# Patient Record
Sex: Male | Born: 1935 | Race: White | Hispanic: No | Marital: Married
Health system: Southern US, Community
[De-identification: ages and names within clinical notes are randomized; demographics above are authoritative.]

## PROBLEM LIST (undated history)

## (undated) DIAGNOSIS — H269 Unspecified cataract: Secondary | ICD-10-CM

## (undated) DIAGNOSIS — I10 Essential (primary) hypertension: Secondary | ICD-10-CM

## (undated) DIAGNOSIS — N200 Calculus of kidney: Secondary | ICD-10-CM

## (undated) DIAGNOSIS — I480 Paroxysmal atrial fibrillation: Secondary | ICD-10-CM

## (undated) DIAGNOSIS — E785 Hyperlipidemia, unspecified: Secondary | ICD-10-CM

## (undated) DIAGNOSIS — N183 Chronic kidney disease, stage 3 unspecified: Secondary | ICD-10-CM

## (undated) DIAGNOSIS — I442 Atrioventricular block, complete: Secondary | ICD-10-CM

## (undated) DIAGNOSIS — I6529 Occlusion and stenosis of unspecified carotid artery: Secondary | ICD-10-CM

## (undated) DIAGNOSIS — G4701 Insomnia due to medical condition: Principal | ICD-10-CM

## (undated) DIAGNOSIS — J309 Allergic rhinitis, unspecified: Secondary | ICD-10-CM

## (undated) DIAGNOSIS — N400 Enlarged prostate without lower urinary tract symptoms: Secondary | ICD-10-CM

## (undated) DIAGNOSIS — J449 Chronic obstructive pulmonary disease, unspecified: Secondary | ICD-10-CM

## (undated) HISTORY — DX: Allergic rhinitis, unspecified: J30.9

## (undated) HISTORY — DX: Insomnia due to medical condition: G47.01

## (undated) HISTORY — PX: PACEMAKER INSERTION: SHX728

## (undated) HISTORY — DX: Calculus of kidney: N20.0

## (undated) HISTORY — DX: Chronic kidney disease, stage 3 unspecified: N18.30

## (undated) HISTORY — DX: Benign prostatic hyperplasia without lower urinary tract symptoms: N40.0

## (undated) HISTORY — DX: Chronic kidney disease, stage 3 (moderate): N18.3

## (undated) HISTORY — DX: Atrioventricular block, complete: I44.2

## (undated) HISTORY — DX: Hyperlipidemia, unspecified: E78.5

## (undated) HISTORY — DX: Unspecified cataract: H26.9

## (undated) HISTORY — DX: Paroxysmal atrial fibrillation: I48.0

## (undated) HISTORY — DX: Occlusion and stenosis of unspecified carotid artery: I65.29

---

## 1999-12-06 ENCOUNTER — Ambulatory Visit (HOSPITAL_COMMUNITY): Admission: RE | Admit: 1999-12-06 | Discharge: 1999-12-06 | Payer: Self-pay | Admitting: *Deleted

## 1999-12-06 ENCOUNTER — Encounter: Payer: Self-pay | Admitting: *Deleted

## 2000-02-29 ENCOUNTER — Ambulatory Visit (HOSPITAL_BASED_OUTPATIENT_CLINIC_OR_DEPARTMENT_OTHER): Admission: RE | Admit: 2000-02-29 | Discharge: 2000-02-29 | Payer: Self-pay | Admitting: Internal Medicine

## 2004-01-20 ENCOUNTER — Inpatient Hospital Stay (HOSPITAL_COMMUNITY): Admission: AD | Admit: 2004-01-20 | Discharge: 2004-01-24 | Payer: Self-pay | Admitting: Cardiology

## 2004-01-23 DIAGNOSIS — I442 Atrioventricular block, complete: Secondary | ICD-10-CM

## 2004-01-23 HISTORY — DX: Atrioventricular block, complete: I44.2

## 2011-08-10 ENCOUNTER — Encounter: Payer: Self-pay | Admitting: Emergency Medicine

## 2011-08-10 ENCOUNTER — Emergency Department (HOSPITAL_COMMUNITY)
Admission: EM | Admit: 2011-08-10 | Discharge: 2011-08-10 | Disposition: A | Discharge status: Home / Self Care | Payer: Medicare Other | Attending: Emergency Medicine | Admitting: Emergency Medicine

## 2011-08-10 ENCOUNTER — Emergency Department (HOSPITAL_COMMUNITY): Payer: Medicare Other

## 2011-08-10 ENCOUNTER — Other Ambulatory Visit: Payer: Self-pay

## 2011-08-10 DIAGNOSIS — M79609 Pain in unspecified limb: Secondary | ICD-10-CM | POA: Insufficient documentation

## 2011-08-10 DIAGNOSIS — R079 Chest pain, unspecified: Secondary | ICD-10-CM | POA: Insufficient documentation

## 2011-08-10 DIAGNOSIS — Z950 Presence of cardiac pacemaker: Secondary | ICD-10-CM | POA: Insufficient documentation

## 2011-08-10 DIAGNOSIS — I10 Essential (primary) hypertension: Secondary | ICD-10-CM | POA: Insufficient documentation

## 2011-08-10 DIAGNOSIS — M25519 Pain in unspecified shoulder: Secondary | ICD-10-CM | POA: Insufficient documentation

## 2011-08-10 DIAGNOSIS — J449 Chronic obstructive pulmonary disease, unspecified: Secondary | ICD-10-CM | POA: Insufficient documentation

## 2011-08-10 DIAGNOSIS — J4489 Other specified chronic obstructive pulmonary disease: Secondary | ICD-10-CM | POA: Insufficient documentation

## 2011-08-10 DIAGNOSIS — E119 Type 2 diabetes mellitus without complications: Secondary | ICD-10-CM | POA: Insufficient documentation

## 2011-08-10 HISTORY — DX: Essential (primary) hypertension: I10

## 2011-08-10 HISTORY — DX: Chronic obstructive pulmonary disease, unspecified: J44.9

## 2011-08-10 LAB — COMPREHENSIVE METABOLIC PANEL
ALT: 14 U/L (ref 0–53)
AST: 22 U/L (ref 0–37)
Albumin: 3.8 g/dL (ref 3.5–5.2)
Alkaline Phosphatase: 50 U/L (ref 39–117)
BUN: 27 mg/dL — ABNORMAL HIGH (ref 6–23)
CO2: 26 mEq/L (ref 19–32)
Calcium: 9.8 mg/dL (ref 8.4–10.5)
Chloride: 108 mEq/L (ref 96–112)
Creatinine, Ser: 1.29 mg/dL (ref 0.50–1.35)
GFR calc Af Amer: 60 mL/min — ABNORMAL LOW (ref >90)
GFR calc non Af Amer: 52 mL/min — ABNORMAL LOW (ref >90)
Glucose, Bld: 76 mg/dL (ref 70–99)
Potassium: 4.9 mEq/L (ref 3.5–5.1)
Sodium: 141 mEq/L (ref 135–145)
Total Bilirubin: 0.5 mg/dL (ref 0.3–1.2)
Total Protein: 6.9 g/dL (ref 6.0–8.3)

## 2011-08-10 LAB — CBC
HCT: 42.4 % (ref 39.0–52.0)
Hemoglobin: 13.4 g/dL (ref 13.0–17.0)
MCH: 26.3 pg (ref 26.0–34.0)
MCHC: 31.6 g/dL (ref 30.0–36.0)
MCV: 83.1 fL (ref 78.0–100.0)
Platelets: 255 10*3/uL (ref 150–400)
RBC: 5.1 MIL/uL (ref 4.22–5.81)
RDW: 15.9 % — ABNORMAL HIGH (ref 11.5–15.5)
WBC: 5 10*3/uL (ref 4.0–10.5)

## 2011-08-10 LAB — POCT I-STAT TROPONIN I: Troponin i, poc: 0.01 ng/mL (ref 0.00–0.08)

## 2011-08-10 LAB — TROPONIN I: Troponin I: <0.3 ng/mL (ref <0.30)

## 2011-08-10 MED ORDER — ASPIRIN 81 MG PO CHEW
243.0000 mg | CHEWABLE_TABLET | Freq: Once | ORAL | Status: AC
  Administered 2011-08-10: 243 mg via ORAL
  Filled 2011-08-10: qty 3

## 2011-08-10 NOTE — ED Provider Notes (Signed)
History     CSN: [id number] Arrival date & time: 08/10/2011  9:16 AM   First MD Initiated Contact with Patient 08/10/11 [phone]      Chief Complaint  Patient presents with  . Chest Pain    chest pain since near 0730 this morning, states no pain now.     (Consider location/radiation/quality/duration/timing/severity/associated sxs/prior treatment) Patient is a 77 y.o. male presenting with chest pain. The history is provided by the patient and a significant other.  Chest Pain The chest pain began less than 1 hour ago. The chest pain is resolved. At its most intense, the pain is at 5/10. The pain is currently at 0/10. The severity of the pain is moderate. The quality of the pain is described as pressure-like. The pain radiates to the left shoulder. Pertinent negatives for primary symptoms include no fever, no shortness of breath, no cough, no palpitations, no abdominal pain, no nausea, no vomiting and no dizziness.  Pertinent negatives for associated symptoms include no diaphoresis, no numbness and no weakness.  His past medical history is significant for arrhythmia, COPD, diabetes and hypertension.  Pertinent negatives for past medical history include no CAD.   Pt states he was eating breakfast when he developed chest pain. States felt like pressure, radiating down left shoulder and upper arm. Denies any associated symptoms. Pain resolved by the time here got here. Pt admits to recently moving to a new home and states he has been under a lot of stress recently.  Past Medical History  Diagnosis Date  . Pacemaker   . Hypertension   . Diabetes mellitus   . COPD (chronic obstructive pulmonary disease)     History reviewed. No pertinent past surgical history.  History reviewed. No pertinent family history.  History  Substance Use Topics  . Smoking status: Former Games developer  . Smokeless tobacco: Not on file  . Alcohol Use: Yes      Review of Systems  Constitutional: Negative for fever,  chills, diaphoresis and activity change.  HENT: Negative.   Eyes: Negative.   Respiratory: Positive for chest tightness. Negative for cough and shortness of breath.   Cardiovascular: Positive for chest pain. Negative for palpitations and leg swelling.  Gastrointestinal: Negative for nausea, vomiting and abdominal pain.  Genitourinary: Negative.   Musculoskeletal: Negative.   Skin: Negative.   Neurological: Negative for dizziness, syncope, weakness, light-headedness and numbness.  Psychiatric/Behavioral: Negative.     Allergies  Penicillins  Home Medications  No current outpatient prescriptions on file.  BP 138/78  Pulse 65  Temp(Src) 97.7 F (36.5 C) (Oral)  Resp 20  SpO2 97%  Physical Exam  Constitutional: He is oriented to person, place, and time. He appears well-developed and well-nourished. No distress.  HENT:  Head: Normocephalic and atraumatic.  Cardiovascular: Normal rate, regular rhythm and normal heart sounds.   Pulmonary/Chest: Effort normal and breath sounds normal. No respiratory distress. He has no wheezes. He exhibits no tenderness.  Abdominal: Soft. Bowel sounds are normal. There is no tenderness.  Musculoskeletal: He exhibits no edema and no tenderness.  Neurological: He is alert and oriented to person, place, and time.  Skin: Skin is warm and dry.  Psychiatric: He has a normal mood and affect.    ED Course  Procedures (including critical care time)  Pt with no known CAD. Pt does have a pacemaker for "heart block." States currently pain free. Labs and CXR ordered.    Date: 08/10/2011  Rate: 65  Rhythm:  Paced rhythm  Narrative Interpretation:   Old EKG Reviewed: unchanged    1:42 PM Pt monitored. 2nd set of enzymes at 5hrs is negative. Spoke with Dr. Katrinka Blazing, discussed findings and labs. Pt wanting to go home. No pain. VS normal. Pt non toxic. Dr. Katrinka Blazing agrees with the plan, they will call pt next week for an appointment with Dr. Mayford Knife for further  testing. Pt instructed to return if symptoms worsening.   MDM          Lottie Mussel, PA 08/10/11 1609

## 2011-08-10 NOTE — ED Provider Notes (Signed)
I have personally performed and participated in all the services and procedures documented herein. I have reviewed the findings with the patient. Pt with atypical pain in that it was not associated with exertion, SOB, nausea, sweating.  ECG is paced, no acute ST changes.  Pt is pain free since arrival .  Pain noticed at 0730.  If 2 sets are negative, will likely discharge home after discussion with [facility] cardiology.  Will need close outpt follow up at the very least. first set negative.  Gavin Pound. Roxie Gueye, MD 08/10/11 1154

## 2011-08-10 NOTE — ED Notes (Signed)
Gave old and new ECG to Dr. Oletta Lamas after I performed.9:29 am JG

## 2011-08-11 NOTE — ED Provider Notes (Signed)
See my addendum note for attestation  Gavin Pound. Oletta Lamas, MD 08/11/11 [phone]

## 2011-12-18 ENCOUNTER — Other Ambulatory Visit: Payer: Self-pay | Admitting: Internal Medicine

## 2011-12-18 DIAGNOSIS — R599 Enlarged lymph nodes, unspecified: Secondary | ICD-10-CM

## 2011-12-23 ENCOUNTER — Ambulatory Visit
Admission: RE | Admit: 2011-12-23 | Discharge: 2011-12-23 | Disposition: A | Discharge status: Home / Self Care | Payer: Medicare Other | Source: Ambulatory Visit | Attending: Internal Medicine | Admitting: Internal Medicine

## 2011-12-23 DIAGNOSIS — R599 Enlarged lymph nodes, unspecified: Secondary | ICD-10-CM

## 2011-12-23 MED ORDER — IOHEXOL 300 MG/ML  SOLN
50.0000 mL | Freq: Once | INTRAMUSCULAR | Status: AC | PRN
  Administered 2011-12-23: 50 mL via INTRAVENOUS

## 2012-01-08 ENCOUNTER — Other Ambulatory Visit: Payer: Self-pay

## 2012-01-29 ENCOUNTER — Encounter: Payer: Self-pay | Admitting: Vascular Surgery

## 2012-02-06 ENCOUNTER — Encounter: Payer: Self-pay | Admitting: Vascular Surgery

## 2012-02-07 ENCOUNTER — Encounter: Payer: Self-pay | Admitting: Vascular Surgery

## 2012-02-07 ENCOUNTER — Ambulatory Visit (INDEPENDENT_AMBULATORY_CARE_PROVIDER_SITE_OTHER): Payer: Medicare Other | Admitting: Vascular Surgery

## 2012-02-07 ENCOUNTER — Other Ambulatory Visit (INDEPENDENT_AMBULATORY_CARE_PROVIDER_SITE_OTHER): Payer: Medicare Other | Admitting: *Deleted

## 2012-02-07 VITALS — BP 157/65 | HR 71 | Temp 97.1°F | Ht 67.5 in | Wt 155.0 lb

## 2012-02-07 DIAGNOSIS — I6523 Occlusion and stenosis of bilateral carotid arteries: Secondary | ICD-10-CM | POA: Insufficient documentation

## 2012-02-07 DIAGNOSIS — I6529 Occlusion and stenosis of unspecified carotid artery: Secondary | ICD-10-CM

## 2012-02-07 DIAGNOSIS — I658 Occlusion and stenosis of other precerebral arteries: Secondary | ICD-10-CM

## 2012-02-07 NOTE — Progress Notes (Signed)
[facility] [place]  New Carotid Patient  Referred by:  Katy Apo, MD [address] [place], [place] [address]  Reason for referral: R carotid stenosis  History of Present Illness  Edward Suarez is a 78 y.o. (Sep 19, 1931) male who presents with chief complaint: possible R carotid disease.  Pt had a CT with contrast of neck and abnormality on the CT was noted involving the R carotid artery.  Patient has no history of TIA or stroke symptom.  The patient has never had amaurosis fugax or monocular blindness.  The patient has never had facial drooping or hemiplegia.  The patient has never had receptive or expressive aphasia.   The patient's previous neurologic deficits have not resolved.  The patient's risks factors for carotid disease include: HTN, DM, prior smoker, hyperlipidemia.  Past Medical History  Diagnosis Date  . Pacemaker     For complete heart block  . Hypertension   . Diabetes mellitus   . COPD (chronic obstructive pulmonary disease)   . Carotid artery occlusion   . Hyperlipidemia   . BPH (benign prostatic hyperplasia)   . Glaucoma   . Cataracts, bilateral   . Kidney stones     Chronic kidney disease Stage 3  . Atrioventricular block, complete   . Allergic rhinitis, cause unspecified   . CAD (coronary artery disease)     Past Surgical History  Procedure Date  . Pacemaker insertion 2005    History   Social History  . Marital Status: Married    Spouse Name: N/A    Number of Children: N/A  . Years of Education: N/A   Occupational History  . Not on file.   Social History Main Topics  . Smoking status: Former Smoker    Types: Cigarettes    Quit date: 09/16/1998  . Smokeless tobacco: Never Used  . Alcohol Use: 3.0 oz/week    2 Glasses of wine, 3 Shots of liquor per week  . Drug Use: No  . Sexually Active: Not on file   Other Topics Concern  . Not on file   Social History Narrative  . No narrative on file     Family History  Problem Relation Age of Onset  . Heart disease Father   . Heart attack Father     Current Outpatient Prescriptions on File Prior to Visit  Medication Sig Dispense Refill  . aspirin 81 MG tablet Take 81 mg by mouth daily.        [name] Kitchen atorvastatin (LIPITOR) 40 MG tablet Take 40 mg by mouth daily.        . cetirizine (ZYRTEC) 10 MG tablet Take 10 mg by mouth daily.        . fosinopril (MONOPRIL) 40 MG tablet Take 40 mg by mouth daily.        [name] Kitchen glipiZIDE (GLUCOTROL) 5 MG tablet Take 5 mg by mouth daily before breakfast.        . pioglitazone (ACTOS) 15 MG tablet Take 15 mg by mouth daily.          Allergies  Allergen Reactions  . Penicillins Rash    REVIEW OF SYSTEMS:  (Positives checked otherwise negative)  CARDIOVASCULAR: [ ]  chest pain    [ ]  chest pressure    [ ]  palpitations    [ ]  orthopnea   [ ]  dyspnea on exert. [ ]  claudication    [ ]  rest pain     [ ]  DVT     [ ]   phlebitis  PULMONARY:    [ ]  productive cough [x]  asthma  [ ]  wheezing  NEUROLOGIC:    [ ]  weakness    [ ]  paresthesias   [ ]  aphasia    [ ]  amaurosis    [ ]  dizziness  HEMATOLOGIC:    [ ]  bleeding problems  [ ]  clotting disorders  MUSCULOSKEL: [ ]  joint pain     [ ]  joint swelling  GASTROINTEST:  [ ]   blood in stool   [ ]   hematemesis  GENITOURINARY:   [ ]   dysuria    [ ]   hematuria  PSYCHIATRIC:   [ ]  history of major depression  INTEGUMENTARY: [ ]  rashes    [ ]  ulcers  CONSTITUTIONAL:  [ ]  fever     [ ]  chills  Physical Examination  Filed Vitals:   02/07/12 1123 02/07/12 1127  BP: 149/67 157/65  Pulse: 65 71  Temp: 97.1 F (36.2 C)   TempSrc: Oral   Height: 5' 7.5" (1.715 m)   Weight: 155 lb (70.308 kg)   SpO2: 98%    Body mass index is 23.92 kg/(m^2).  General: A&O x 3, WDWN ,thin  Head: [place]/AT  Ear/Nose/Throat: Hearing grossly intact, nares w/o erythema or drainage, oropharynx w/o Erythema/Exudate  Eyes: PERRLA, EOMI  Neck: Supple, no nuchal rigidity, no palpable  LAD  Pulmonary: Sym exp, good air movt, CTAB, no rales, rhonchi, & wheezing  Cardiac: RRR, Nl S1, S2, no Murmurs, rubs or gallops  Vascular: Vessel Right Left  Radial Palpable Palpable  Brachial Palpable Palpable  Carotid Palpable, without bruit Palpable, without bruit  Aorta Palpable normal size N/A  Femoral Palpable Palpable  Popliteal Non-palpable Non-palpable  PT Palpable Palpable  DP Palpable Palpable   Gastrointestinal: soft, NTND, -G/R, - HSM, - masses, - CVAT B  Musculoskeletal: M/S 5/5 throughout , Extremities without ischemic changes   Neurologic: CN 2-12 intact , Pain and light touch intact in extremities , Motor exam as listed above  Psychiatric: Judgment intact, Mood & affect appropriate for pt's clinical situation  Dermatologic: See M/S exam for extremity exam, no rashes otherwise noted  Lymph : No Cervical, Axillary, or Inguinal lymphadenopathy   Non-Invasive Vascular Imaging  CAROTID DUPLEX (Date: 02/07/12):   R ICA stenosis: 1-39%  R VA: patent and antegrade  L ICA stenosis: 1-39%  L VA: patent and antegrade  Outside Studies/Documentation 10 pages of outside documents were reviewed including: clinic chart and CT Neck report.  CT Neck with contrast (12/23/11)  1. No cervical lymphadenopathy or discrete mass to explain the palpable area of concern. However, there is indistinctness of the right carotid space at the level of the marked area, and furthermore, suggestion of high-grade right ICA stenosis at this level.  Neck CTA with contrast may be superior in evaluating both of these features of the right carotid space, and is recommended to quantitate the degree of ICA stenosis unless other modalities such as carotid ultrasound is preferred due to the patient's renal insufficiency. Based on these images, right carotodynia is my leading consideration for the indistinct appearance.   2. Left thyromegaly and heterogeneous left lobe thyroid nodule measuring up to  25 mm diameter. Consider further evaluation with thyroid ultrasound. If the patient is clinically hyperthyroid, consider nuclear medicine thyroid scan.  Based on my interpretation of the CT, this patient's carotids are not well evaluated due to poor timing of dye delivery relative scan, which is not surprising given its not a CTA  Medical Decision Making  Edward Suarez is a 78 y.o. male who presents with: minimal carotid disease bilaterally   The CT Neck is inadequate for carotid evaluation.  I don't think this patient would benefit from carotid angiogram with its 1% stroke rate in this case.  With <50% disease bilaterally, there is no indication for intervention anyway. I would repeat the carotid duplex in 6 month and I suspect it will remain minimal disease.  I discussed in depth with the patient the nature of atherosclerosis, and emphasized the importance of maximal medical management including strict control of blood pressure, blood glucose, and lipid levels, obtaining regular exercise, antiplatelet agents, and cessation of smoking.  The patient is aware that without maximal medical management the underlying atherosclerotic disease process will progress, limiting the benefit of any interventions.  Thank you for allowing [place] to participate in this patient's care.  Leonides Sake, MD [facility] [place] Office: [phone] Pager: [phone]  02/07/2012, 12:08 PM

## 2012-02-21 NOTE — Procedures (Unsigned)
CAROTID DUPLEX EXAM  INDICATION:  Carotid disease  HISTORY: Diabetes:  Yes Cardiac:  Yes Hypertension:  Yes Smoking:  Previous Previous Surgery:  No CV History:  Currently asymptomatic Amaurosis Fugax No, Paresthesias No, Hemiparesis No                                      RIGHT             LEFT Brachial systolic pressure:         146               150 Brachial Doppler waveforms:         Normal            Normal Vertebral direction of flow:        Antegrade         Antegrade DUPLEX VELOCITIES (cm/sec) CCA peak systolic                   60                51 ECA peak systolic                   78                67 ICA peak systolic                   133               33 ICA end diastolic                   34                9 PLAQUE MORPHOLOGY:                  Mixed             Mixed PLAQUE AMOUNT:                      Mild              Mild PLAQUE LOCATION:                    ICA               ICA  IMPRESSION: 1. Doppler velocities suggest 1%-39% stenoses of the bilateral     proximal internal carotid arteries. 2. The bilateral vertebral arteries appear antegrade.     ___________________________________________ Fransisco Hertz, MD  CH/MEDQ  D:  02/11/2012  T:  02/11/2012  Job:  [id number]

## 2012-03-23 ENCOUNTER — Encounter: Payer: Self-pay | Admitting: Internal Medicine

## 2012-04-13 ENCOUNTER — Ambulatory Visit (INDEPENDENT_AMBULATORY_CARE_PROVIDER_SITE_OTHER): Payer: Medicare Other | Admitting: Internal Medicine

## 2012-04-13 ENCOUNTER — Encounter: Payer: Self-pay | Admitting: Internal Medicine

## 2012-04-13 ENCOUNTER — Encounter: Payer: Self-pay | Admitting: *Deleted

## 2012-04-13 VITALS — BP 116/62 | HR 71 | Resp 18 | Ht 68.0 in | Wt 153.2 lb

## 2012-04-13 DIAGNOSIS — I10 Essential (primary) hypertension: Secondary | ICD-10-CM

## 2012-04-13 DIAGNOSIS — I2510 Atherosclerotic heart disease of native coronary artery without angina pectoris: Secondary | ICD-10-CM

## 2012-04-13 DIAGNOSIS — I442 Atrioventricular block, complete: Secondary | ICD-10-CM

## 2012-04-13 LAB — PACEMAKER DEVICE OBSERVATION
BATTERY VOLTAGE: 2.6 V
BMOD-0003RV: 30
BMOD-0005RV: 95 {beats}/min
BRDY-0002RV: 65 {beats}/min
RV LEAD IMPEDENCE PM: 594 Ohm
RV LEAD THRESHOLD: 0.75 V
VENTRICULAR PACING PM: 100

## 2012-04-13 NOTE — Patient Instructions (Addendum)
Your physician has recommended that you have battery change out for device  See instruction sheet

## 2012-04-14 ENCOUNTER — Encounter (HOSPITAL_COMMUNITY): Payer: Self-pay | Admitting: Pharmacy Technician

## 2012-04-14 LAB — BASIC METABOLIC PANEL
BUN: 39 mg/dL — ABNORMAL HIGH (ref 6–23)
CO2: 26 mEq/L (ref 19–32)
Calcium: 9.3 mg/dL (ref 8.4–10.5)
Chloride: 108 mEq/L (ref 96–112)
Creatinine, Ser: 2 mg/dL — ABNORMAL HIGH (ref 0.4–1.5)
GFR: 35.1 mL/min — ABNORMAL LOW (ref >60.00)
Glucose, Bld: 89 mg/dL (ref 70–99)
Potassium: 4.9 mEq/L (ref 3.5–5.1)
Sodium: 141 mEq/L (ref 135–145)

## 2012-04-14 LAB — CBC WITH DIFFERENTIAL/PLATELET
Basophils Absolute: 0 10*3/uL (ref 0.0–0.1)
Basophils Relative: 0.3 % (ref 0.0–3.0)
Eosinophils Absolute: 0.3 10*3/uL (ref 0.0–0.7)
Eosinophils Relative: 3.4 % (ref 0.0–5.0)
HCT: 46.3 % (ref 39.0–52.0)
Hemoglobin: 14.8 g/dL (ref 13.0–17.0)
Lymphocytes Relative: 39.7 % (ref 12.0–46.0)
Lymphs Abs: 3.2 10*3/uL (ref 0.7–4.0)
MCHC: 31.9 g/dL (ref 30.0–36.0)
MCV: 85.4 fl (ref 78.0–100.0)
Monocytes Absolute: 0.6 10*3/uL (ref 0.1–1.0)
Monocytes Relative: 7 % (ref 3.0–12.0)
Neutro Abs: 4 10*3/uL (ref 1.4–7.7)
Neutrophils Relative %: 49.6 % (ref 43.0–77.0)
Platelets: 216 10*3/uL (ref 150.0–400.0)
RBC: 5.42 Mil/uL (ref 4.22–5.81)
RDW: 16.4 % — ABNORMAL HIGH (ref 11.5–14.6)
WBC: 8.1 10*3/uL (ref 4.5–10.5)

## 2012-04-15 ENCOUNTER — Encounter: Payer: Self-pay | Admitting: Internal Medicine

## 2012-04-15 DIAGNOSIS — I10 Essential (primary) hypertension: Secondary | ICD-10-CM | POA: Insufficient documentation

## 2012-04-15 NOTE — Progress Notes (Signed)
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   The patient reports doing very well since having a pacemaker implanted in 2005 and remains very active despite his age.   Today, he  denies symptoms of palpitations, chest pain, shortness of breath, orthopnea, PND, lower extremity edema, dizziness, presyncope, syncope, or neurologic sequela.   He has recently reached ERI battery status.  He is therefore referred for pacemaker pulse generator replacement.  The patientis tolerating medications without difficulties and is otherwise without complaint today.   Past Medical History  Diagnosis Date  . Complete heart block 01/23/04    s/p Medtronic PPM implanted by Dr Edward Suarez  . Hypertension   . Diabetes mellitus   . COPD (chronic obstructive pulmonary disease)   . Carotid artery occlusion   . Hyperlipidemia   . BPH (benign prostatic hyperplasia)   . Glaucoma   . Cataracts, bilateral   . Kidney stones   . Allergic rhinitis, cause unspecified   . CAD (coronary artery disease)   . Chronic kidney disease (CKD), stage III (moderate)    Past Surgical History  Procedure Date  . Pacemaker insertion 2005    MDT implanted by Dr Edward Suarez    History   Social History  . Marital Status: Married    Spouse Name: N/A    Number of Children: N/A  . Years of Education: N/A   Occupational History  . Not on file.   Social History Main Topics  . Smoking status: Former Smoker    Types: Cigarettes    Quit date: 09/16/1998  . Smokeless tobacco: Never Used  . Alcohol Use: 3.0 oz/week    2 Glasses of wine, 3 Shots of liquor per week     drinks a tequila shot daily  . Drug Use: No  . Sexually Active: Not on file   Other Topics Concern  . Not on file   Social History Narrative   Retired Mudlogger.  Lives in  [place].    Family History  Problem Relation Age of Onset  . Heart disease Father   . Heart attack Father     Allergies  Allergen Reactions  . Penicillins Rash    Current Outpatient Prescriptions  Medication Sig Dispense Refill  . acetaminophen (TYLENOL ARTHRITIS PAIN) 650 MG CR tablet Take 650 mg by mouth daily as needed. For pain      . atorvastatin (LIPITOR) 40 MG tablet Take 40 mg by mouth daily.        . cetirizine (ZYRTEC) 10 MG tablet Take 10 mg by mouth daily as needed. For allergies      . fosinopril (MONOPRIL) 40 MG tablet Take 40 mg by mouth daily.        [name] Kitchen glipiZIDE (GLUCOTROL) 5 MG tablet Take 5 mg by mouth daily before breakfast.        . pioglitazone (ACTOS) 15 MG tablet Take 15 mg by mouth daily.        [name] Kitchen aspirin EC 81 MG tablet Take 81 mg by mouth daily.        ROS- all systems are reviewed and negative except as per HPI  Physical Exam: Filed Vitals:   04/13/12 1602  BP: 116/62  Pulse: 71  Resp: 18  Height: 5\' 8"  (1.727 m)  Weight: 153 lb  3.2 oz (69.491 kg)  SpO2: 92%    GEN- The patient is elderly appearing, alert and oriented x 3 today.   Head- normocephalic, atraumatic Eyes-  Sclera clear, conjunctiva pink Ears- hearing intact Oropharynx- clear Neck- supple,  Lungs- Clear to ausculation bilaterally, normal work of breathing Chest- pacemaker pocket is well healed Heart- Regular rate and rhythm (paced) GI- soft, NT, ND, + BS Extremities- no clubbing, cyanosis, +1 edema MS- age appropriate atrophy Skin- no rash or lesion Psych- euthymic mood, full affect Neuro- strength and sensation are intact  Pacemaker interrogation- reviewed in detail today,  See PACEART report  Assessment and Plan:

## 2012-04-15 NOTE — Assessment & Plan Note (Signed)
No ischemic symptoms No changes 

## 2012-04-15 NOTE — Assessment & Plan Note (Signed)
Stable No change required today  

## 2012-04-15 NOTE — Assessment & Plan Note (Signed)
The patient presents today for EP consultation.  His pacemaker has reached ERI battery status.  He has complete heart block.  Risks, benefits, and alternatives to PPM pulse generator replacement were discussed at length with the patient who wishes to proceed.  We will therefore schedule PPM pulse generator replacement at the next available time.

## 2012-04-16 ENCOUNTER — Ambulatory Visit (HOSPITAL_COMMUNITY)
Admission: RE | Admit: 2012-04-16 | Discharge: 2012-04-16 | Disposition: A | Discharge status: Home / Self Care | Payer: Medicare Other | Source: Ambulatory Visit | Attending: Internal Medicine | Admitting: Internal Medicine

## 2012-04-16 ENCOUNTER — Encounter (HOSPITAL_COMMUNITY)
Admission: RE | Disposition: A | Discharge status: Home / Self Care | Payer: Self-pay | Source: Ambulatory Visit | Attending: Internal Medicine

## 2012-04-16 DIAGNOSIS — N183 Chronic kidney disease, stage 3 unspecified: Secondary | ICD-10-CM | POA: Insufficient documentation

## 2012-04-16 DIAGNOSIS — E119 Type 2 diabetes mellitus without complications: Secondary | ICD-10-CM | POA: Insufficient documentation

## 2012-04-16 DIAGNOSIS — J449 Chronic obstructive pulmonary disease, unspecified: Secondary | ICD-10-CM | POA: Insufficient documentation

## 2012-04-16 DIAGNOSIS — I10 Essential (primary) hypertension: Secondary | ICD-10-CM | POA: Diagnosis present

## 2012-04-16 DIAGNOSIS — I129 Hypertensive chronic kidney disease with stage 1 through stage 4 chronic kidney disease, or unspecified chronic kidney disease: Secondary | ICD-10-CM | POA: Insufficient documentation

## 2012-04-16 DIAGNOSIS — Z45018 Encounter for adjustment and management of other part of cardiac pacemaker: Secondary | ICD-10-CM | POA: Insufficient documentation

## 2012-04-16 DIAGNOSIS — J4489 Other specified chronic obstructive pulmonary disease: Secondary | ICD-10-CM | POA: Insufficient documentation

## 2012-04-16 DIAGNOSIS — I442 Atrioventricular block, complete: Secondary | ICD-10-CM

## 2012-04-16 HISTORY — PX: PERMANENT PACEMAKER GENERATOR CHANGE: SHX6022

## 2012-04-16 LAB — BASIC METABOLIC PANEL
BUN: 39 mg/dL — ABNORMAL HIGH (ref 6–23)
CO2: 26 mEq/L (ref 19–32)
Calcium: 9.7 mg/dL (ref 8.4–10.5)
Chloride: 103 mEq/L (ref 96–112)
Creatinine, Ser: 1.69 mg/dL — ABNORMAL HIGH (ref 0.50–1.35)
GFR calc Af Amer: 43 mL/min — ABNORMAL LOW (ref >90)
GFR calc non Af Amer: 37 mL/min — ABNORMAL LOW (ref >90)
Glucose, Bld: 142 mg/dL — ABNORMAL HIGH (ref 70–99)
Potassium: 4.9 mEq/L (ref 3.5–5.1)
Sodium: 139 mEq/L (ref 135–145)

## 2012-04-16 LAB — SURGICAL PCR SCREEN
MRSA, PCR: NEGATIVE
Staphylococcus aureus: NEGATIVE

## 2012-04-16 LAB — GLUCOSE, CAPILLARY: Glucose-Capillary: 137 mg/dL — ABNORMAL HIGH (ref 70–99)

## 2012-04-16 SURGERY — PERMANENT PACEMAKER GENERATOR CHANGE
Anesthesia: LOCAL

## 2012-04-16 MED ORDER — CHLORHEXIDINE GLUCONATE 4 % EX LIQD
60.0000 mL | Freq: Once | CUTANEOUS | Status: DC
  Filled 2012-04-16: qty 60

## 2012-04-16 MED ORDER — ACETAMINOPHEN 325 MG PO TABS: 325.0000 mg | ORAL_TABLET | ORAL | Status: DC | PRN

## 2012-04-16 MED ORDER — ONDANSETRON HCL 4 MG/2ML IJ SOLN: 4.0000 mg | Freq: Four times a day (QID) | INTRAMUSCULAR | Status: DC | PRN

## 2012-04-16 MED ORDER — SODIUM CHLORIDE 0.9 % IJ SOLN: 3.0000 mL | INTRAMUSCULAR | Status: DC | PRN

## 2012-04-16 MED ORDER — MUPIROCIN 2 % EX OINT
TOPICAL_OINTMENT | Freq: Two times a day (BID) | CUTANEOUS | Status: DC
  Administered 2012-04-16: 1 via NASAL
  Filled 2012-04-16 (×2): qty 22

## 2012-04-16 MED ORDER — SODIUM CHLORIDE 0.9 % IJ SOLN: 3.0000 mL | Freq: Two times a day (BID) | INTRAMUSCULAR | Status: DC

## 2012-04-16 MED ORDER — HYDROCODONE-ACETAMINOPHEN 5-325 MG PO TABS: 1.0000 | ORAL_TABLET | ORAL | Status: DC | PRN

## 2012-04-16 MED ORDER — LIDOCAINE HCL (PF) 1 % IJ SOLN
INTRAMUSCULAR | Status: AC
  Filled 2012-04-16: qty 60

## 2012-04-16 MED ORDER — SODIUM CHLORIDE 0.9 % IV SOLN: 250.0000 mL | INTRAVENOUS | Status: DC | PRN

## 2012-04-16 MED ORDER — SODIUM CHLORIDE 0.45 % IV SOLN
INTRAVENOUS | Status: DC
  Administered 2012-04-16: 11:00:00 via INTRAVENOUS

## 2012-04-16 MED ORDER — VANCOMYCIN HCL IN DEXTROSE 1-5 GM/200ML-% IV SOLN
1000.0000 mg | INTRAVENOUS | Status: DC
  Filled 2012-04-16 (×2): qty 200

## 2012-04-16 MED ORDER — SODIUM CHLORIDE 0.9 % IR SOLN
80.0000 mg | Status: DC
  Filled 2012-04-16: qty 2

## 2012-04-16 NOTE — Interval H&P Note (Signed)
History and Physical Interval Note:  04/16/2012 10:51 AM  Edward Suarez  has presented today for surgery, with the diagnosis of ERI  The various methods of treatment have been discussed with the patient and family. After consideration of risks, benefits and other options for treatment, the patient has consented to  Procedure(s) (LRB): PERMANENT PACEMAKER GENERATOR CHANGE (N/A) as a surgical intervention .  The patient's history has been reviewed, patient examined, no change in status, stable for surgery.  I have reviewed the patient's chart and labs.  Questions were answered to the patient's satisfaction.     Hillis Range

## 2012-04-16 NOTE — Op Note (Signed)
SURGEON:  Hillis Range, MD     PREPROCEDURE DIAGNOSES:   1. Complete heart block    POSTPROCEDURE DIAGNOSES:   1. Complete heart block    PROCEDURES:   1. Pacemaker pulse generator replacement.   2. Skin pocket revision.     INTRODUCTION:  Edward Suarez is a 78 y.o. male with a history of complete heart block. He has done well since his pacemaker was implanted.  He has recently reached ERI battery status.  He reports fatigue and decreased exercise tolerance with VVI pacing.  He presents today for pacemaker pulse generator replacement.       DESCRIPTION OF THE PROCEDURE:  Informed written consent was obtained, and the patient was brought to the electrophysiology lab in the fasting state.  The patient's pacemaker was interrogated today and found to be at elective replacement indicator battery status.  The patient was adequately sedated with intravenous Versed as outlined in the nursing report.  The patient's left chest was prepped and draped in the usual sterile fashion by the EP lab staff.  The skin overlying the existing pacemaker was infiltrated with lidocaine for local analgesia.  A 4-cm incision was made over the pacemaker pocket.  Using a combination of sharp and blunt dissection, the pacemaker was exposed and removed from the body.  The device was disconnected from the leads.  There was no foreign matter or debris within the pocket.  The atrial lead was confirmed to be a Medtronic model [id number] (serial number [id number]) lead implanted on 01/23/2004.  The right ventricular lead was confirmed to be a Medtronic model [id number] (serial number [id number]) lead implanted on the same date as the atrial lead (above).  Both leads were examined and their integrity was confirmed to be intact.  Atrial lead P-waves measured 6 mV with impedance of 395 ohms and a threshold of 0.5 V at 0.5 msec.  Right ventricular lead R-waves were not present today.  The RV lead impedance was 579 ohms with a pacing threshold of 1.0 V  at 0.5 msec.  Both leads were connected to a Medtronic adapt L model ADDDR 1 (serial number [id number]) pacemaker.  The pocket was revised to accommodate this new device.  Electrocautery was required to assure hemostasis.  The pocket was irrigated with copious gentamicin solution. The pacemaker was then placed into the pocket.  The pocket was then closed in 2 layers with 2-0 Vicryl suture over the subcutaneous and subcuticular layers.  Steri-Strips and a sterile dressing were then applied.  There were no early apparent complications.     CONCLUSIONS:   1. Successful pacemaker pulse generator replacement for elective replacement indicator battery status   2. No early apparent complications.     Hillis Range, MD 04/16/2012 3:29 PM

## 2012-04-16 NOTE — H&P (View-Only) (Signed)
 POLITE,RONALD D, MD: Primary Cardiologist:  Dr Turner  Edward Suarez is a 78 y.o. male with a h/o complete heart block sp PPM (MDT) by Dr Edmunds who presents today to establish care in the Electrophysiology device clinic.   The patient reports doing very well since having a pacemaker implanted in 2005 and remains very active despite his age.   Today, he  denies symptoms of palpitations, chest pain, shortness of breath, orthopnea, PND, lower extremity edema, dizziness, presyncope, syncope, or neurologic sequela.   He has recently reached ERI battery status.  He is therefore referred for pacemaker pulse generator replacement.  The patientis tolerating medications without difficulties and is otherwise without complaint today.   Past Medical History  Diagnosis Date  . Complete heart block 01/23/04    s/p Medtronic PPM implanted by Dr Edmunds  . Hypertension   . Diabetes mellitus   . COPD (chronic obstructive pulmonary disease)   . Carotid artery occlusion   . Hyperlipidemia   . BPH (benign prostatic hyperplasia)   . Glaucoma   . Cataracts, bilateral   . Kidney stones   . Allergic rhinitis, cause unspecified   . CAD (coronary artery disease)   . Chronic kidney disease (CKD), stage III (moderate)    Past Surgical History  Procedure Date  . Pacemaker insertion 2005    MDT implanted by Dr Edmunds    History   Social History  . Marital Status: Married    Spouse Name: N/A    Number of Children: N/A  . Years of Education: N/A   Occupational History  . Not on file.   Social History Main Topics  . Smoking status: Former Smoker    Types: Cigarettes    Quit date: 09/16/1998  . Smokeless tobacco: Never Used  . Alcohol Use: 3.0 oz/week    2 Glasses of wine, 3 Shots of liquor per week     drinks a tequila shot daily  . Drug Use: No  . Sexually Active: Not on file   Other Topics Concern  . Not on file   Social History Narrative   Retired bagel bakery owner.  Lives in  [place].    Family History  Problem Relation Age of Onset  . Heart disease Father   . Heart attack Father     Allergies  Allergen Reactions  . Penicillins Rash    Current Outpatient Prescriptions  Medication Sig Dispense Refill  . acetaminophen (TYLENOL ARTHRITIS PAIN) 650 MG CR tablet Take 650 mg by mouth daily as needed. For pain      . atorvastatin (LIPITOR) 40 MG tablet Take 40 mg by mouth daily.        . cetirizine (ZYRTEC) 10 MG tablet Take 10 mg by mouth daily as needed. For allergies      . fosinopril (MONOPRIL) 40 MG tablet Take 40 mg by mouth daily.        . glipiZIDE (GLUCOTROL) 5 MG tablet Take 5 mg by mouth daily before breakfast.        . pioglitazone (ACTOS) 15 MG tablet Take 15 mg by mouth daily.        . aspirin EC 81 MG tablet Take 81 mg by mouth daily.        ROS- all systems are reviewed and negative except as per HPI  Physical Exam: Filed Vitals:   04/13/12 1602  BP: 116/62  Pulse: 71  Resp: 18  Height: 5' 8" (1.727 m)  Weight: 153 lb   3.2 oz (69.491 kg)  SpO2: 92%    GEN- The patient is elderly appearing, alert and oriented x 3 today.   Head- normocephalic, atraumatic Eyes-  Sclera clear, conjunctiva pink Ears- hearing intact Oropharynx- clear Neck- supple,  Lungs- Clear to ausculation bilaterally, normal work of breathing Chest- pacemaker pocket is well healed Heart- Regular rate and rhythm (paced) GI- soft, NT, ND, + BS Extremities- no clubbing, cyanosis, +1 edema MS- age appropriate atrophy Skin- no rash or lesion Psych- euthymic mood, full affect Neuro- strength and sensation are intact  Pacemaker interrogation- reviewed in detail today,  See PACEART report  Assessment and Plan:   

## 2012-04-17 ENCOUNTER — Encounter: Payer: Self-pay | Admitting: *Deleted

## 2012-04-17 DIAGNOSIS — Z950 Presence of cardiac pacemaker: Secondary | ICD-10-CM | POA: Insufficient documentation

## 2012-04-23 ENCOUNTER — Ambulatory Visit (INDEPENDENT_AMBULATORY_CARE_PROVIDER_SITE_OTHER): Payer: Medicare Other | Admitting: *Deleted

## 2012-04-23 ENCOUNTER — Encounter: Payer: Self-pay | Admitting: Internal Medicine

## 2012-04-23 DIAGNOSIS — I442 Atrioventricular block, complete: Secondary | ICD-10-CM

## 2012-04-23 LAB — PACEMAKER DEVICE OBSERVATION
AL AMPLITUDE: 4 mv
AL IMPEDENCE PM: 416 Ohm
AL THRESHOLD: 0.5 V
ATRIAL PACING PM: 35
BAMS-0001: 150 {beats}/min
BATTERY VOLTAGE: 2.8 V
RV LEAD IMPEDENCE PM: 575 Ohm
RV LEAD THRESHOLD: 1 V
VENTRICULAR PACING PM: 100

## 2012-04-23 NOTE — Progress Notes (Signed)
Wound check-PPM 

## 2012-07-23 ENCOUNTER — Encounter: Payer: Self-pay | Admitting: Internal Medicine

## 2012-07-23 ENCOUNTER — Ambulatory Visit (INDEPENDENT_AMBULATORY_CARE_PROVIDER_SITE_OTHER): Payer: Medicare Other | Admitting: Internal Medicine

## 2012-07-23 VITALS — BP 144/72 | HR 71 | Ht 68.0 in | Wt 159.0 lb

## 2012-07-23 DIAGNOSIS — I442 Atrioventricular block, complete: Secondary | ICD-10-CM

## 2012-07-23 LAB — PACEMAKER DEVICE OBSERVATION
AL AMPLITUDE: 2.8 mv
AL IMPEDENCE PM: 404 Ohm
AL THRESHOLD: 0.5 V
ATRIAL PACING PM: 31.9
BAMS-0001: 150 {beats}/min
BATTERY VOLTAGE: 2.8 V
RV LEAD IMPEDENCE PM: 593 Ohm
RV LEAD THRESHOLD: 0.75 V
VENTRICULAR PACING PM: 100

## 2012-07-23 NOTE — Progress Notes (Signed)
PCP: Katy Apo, MD Primary Cardiologist:  Dr Loma Sender Edward Suarez is a 78 y.o. male who presents today for routine electrophysiology followup.  Since his recent pacemaker generator change, the patient reports doing very well.  He denies procedure related issues.  Today, he denies symptoms of palpitations, chest pain, shortness of breath,  lower extremity edema, dizziness, presyncope, or syncope.  The patient is otherwise without complaint today.   Past Medical History  Diagnosis Date  . Complete heart block 01/23/04    s/p Medtronic PPM implanted by Dr Amil Amen  . Hypertension   . Diabetes mellitus   . COPD (chronic obstructive pulmonary disease)   . Carotid artery occlusion   . Hyperlipidemia   . BPH (benign prostatic hyperplasia)   . Glaucoma   . Cataracts, bilateral   . Kidney stones   . Allergic rhinitis, cause unspecified   . CAD (coronary artery disease)   . Chronic kidney disease (CKD), stage III (moderate)    Past Surgical History  Procedure Date  . Pacemaker insertion 2005, 04/16/12    MDT implanted by Dr Amil Amen with generator chnage (MDT Adapta L) by Dr Johney Frame 04/16/12    Current Outpatient Prescriptions  Medication Sig Dispense Refill  . acetaminophen (TYLENOL ARTHRITIS PAIN) 650 MG CR tablet Take 650 mg by mouth daily as needed. For pain      . aspirin EC 81 MG tablet Take 81 mg by mouth daily.      [name] Kitchen atorvastatin (LIPITOR) 40 MG tablet Take 40 mg by mouth daily.        . cetirizine (ZYRTEC) 10 MG tablet Take 10 mg by mouth daily as needed. For allergies      . fosinopril (MONOPRIL) 40 MG tablet Take 40 mg by mouth daily.        [name] Kitchen glipiZIDE (GLUCOTROL) 5 MG tablet Take 5 mg by mouth daily before breakfast.        . pioglitazone (ACTOS) 15 MG tablet Take 15 mg by mouth daily.          Physical Exam: Filed Vitals:   07/23/12 0939  BP: 144/72  Pulse: 71  Height: 5\' 8"  (1.727 m)  Weight: 159 lb (72.122 kg)  SpO2: 94%    GEN- The patient is well appearing,  alert and oriented x 3 today.   Head- normocephalic, atraumatic Eyes-  Sclera clear, conjunctiva pink Ears- hearing intact Oropharynx- clear Lungs- Clear to ausculation bilaterally, normal work of breathing Chest- pacemaker pocket is well healed Heart- Regular rate and rhythm, no murmurs, rubs or gallops, PMI not laterally displaced GI- soft, NT, ND, + BS Extremities- no clubbing, cyanosis, or edema  Pacemaker interrogation- reviewed in detail today,  See PACEART report  Assessment and Plan:  Atrioventricular block, complete    Normal pacemaker function See Pace Art report No changes today He will follow-up with Dr Mayford Knife going forward and Edward will see as needed  CAD (coronary artery disease)  No ischemic symptoms  No changes  Hypertension  Stable  No change required today

## 2012-07-23 NOTE — Patient Instructions (Signed)
Your physician recommends that you schedule a follow-up appointment as needed  Keep regular follow ups with Dr Mayford Knife

## 2012-07-24 ENCOUNTER — Other Ambulatory Visit: Payer: Medicare Other

## 2012-07-24 ENCOUNTER — Ambulatory Visit: Payer: Medicare Other | Admitting: Neurosurgery

## 2012-08-14 ENCOUNTER — Ambulatory Visit: Payer: Medicare Other | Admitting: Neurosurgery

## 2012-08-14 ENCOUNTER — Other Ambulatory Visit: Payer: Medicare Other

## 2013-04-06 ENCOUNTER — Emergency Department (HOSPITAL_COMMUNITY): Payer: Medicare Other

## 2013-04-06 ENCOUNTER — Encounter (HOSPITAL_COMMUNITY): Payer: Self-pay | Admitting: Emergency Medicine

## 2013-04-06 ENCOUNTER — Emergency Department (HOSPITAL_COMMUNITY)
Admission: EM | Admit: 2013-04-06 | Discharge: 2013-04-06 | Disposition: A | Discharge status: Home / Self Care | Payer: Medicare Other | Attending: Emergency Medicine | Admitting: Emergency Medicine

## 2013-04-06 DIAGNOSIS — J4489 Other specified chronic obstructive pulmonary disease: Secondary | ICD-10-CM | POA: Insufficient documentation

## 2013-04-06 DIAGNOSIS — I498 Other specified cardiac arrhythmias: Secondary | ICD-10-CM

## 2013-04-06 DIAGNOSIS — Z79899 Other long term (current) drug therapy: Secondary | ICD-10-CM | POA: Insufficient documentation

## 2013-04-06 DIAGNOSIS — Z8669 Personal history of other diseases of the nervous system and sense organs: Secondary | ICD-10-CM | POA: Insufficient documentation

## 2013-04-06 DIAGNOSIS — Z8679 Personal history of other diseases of the circulatory system: Secondary | ICD-10-CM | POA: Insufficient documentation

## 2013-04-06 DIAGNOSIS — I2510 Atherosclerotic heart disease of native coronary artery without angina pectoris: Secondary | ICD-10-CM | POA: Insufficient documentation

## 2013-04-06 DIAGNOSIS — J449 Chronic obstructive pulmonary disease, unspecified: Secondary | ICD-10-CM | POA: Insufficient documentation

## 2013-04-06 DIAGNOSIS — Z87891 Personal history of nicotine dependence: Secondary | ICD-10-CM | POA: Insufficient documentation

## 2013-04-06 DIAGNOSIS — R002 Palpitations: Secondary | ICD-10-CM

## 2013-04-06 DIAGNOSIS — J309 Allergic rhinitis, unspecified: Secondary | ICD-10-CM | POA: Insufficient documentation

## 2013-04-06 DIAGNOSIS — Z7982 Long term (current) use of aspirin: Secondary | ICD-10-CM | POA: Insufficient documentation

## 2013-04-06 DIAGNOSIS — Z87442 Personal history of urinary calculi: Secondary | ICD-10-CM | POA: Insufficient documentation

## 2013-04-06 DIAGNOSIS — Z880 Allergy status to penicillin: Secondary | ICD-10-CM | POA: Insufficient documentation

## 2013-04-06 DIAGNOSIS — I129 Hypertensive chronic kidney disease with stage 1 through stage 4 chronic kidney disease, or unspecified chronic kidney disease: Secondary | ICD-10-CM | POA: Insufficient documentation

## 2013-04-06 DIAGNOSIS — E785 Hyperlipidemia, unspecified: Secondary | ICD-10-CM | POA: Insufficient documentation

## 2013-04-06 DIAGNOSIS — R0609 Other forms of dyspnea: Secondary | ICD-10-CM | POA: Insufficient documentation

## 2013-04-06 DIAGNOSIS — N400 Enlarged prostate without lower urinary tract symptoms: Secondary | ICD-10-CM | POA: Insufficient documentation

## 2013-04-06 DIAGNOSIS — N183 Chronic kidney disease, stage 3 unspecified: Secondary | ICD-10-CM | POA: Insufficient documentation

## 2013-04-06 DIAGNOSIS — R0989 Other specified symptoms and signs involving the circulatory and respiratory systems: Secondary | ICD-10-CM | POA: Insufficient documentation

## 2013-04-06 DIAGNOSIS — E119 Type 2 diabetes mellitus without complications: Secondary | ICD-10-CM | POA: Insufficient documentation

## 2013-04-06 DIAGNOSIS — R0600 Dyspnea, unspecified: Secondary | ICD-10-CM

## 2013-04-06 LAB — CBC WITH DIFFERENTIAL/PLATELET
Basophils Absolute: 0 10*3/uL (ref 0.0–0.1)
Basophils Relative: 0 % (ref 0–1)
Eosinophils Absolute: 0.2 10*3/uL (ref 0.0–0.7)
Eosinophils Relative: 3 % (ref 0–5)
HCT: 43.8 % (ref 39.0–52.0)
Hemoglobin: 14.7 g/dL (ref 13.0–17.0)
Lymphocytes Relative: 37 % (ref 12–46)
Lymphs Abs: 2.2 10*3/uL (ref 0.7–4.0)
MCH: 27.5 pg (ref 26.0–34.0)
MCHC: 33.6 g/dL (ref 30.0–36.0)
MCV: 81.9 fL (ref 78.0–100.0)
Monocytes Absolute: 0.5 10*3/uL (ref 0.1–1.0)
Monocytes Relative: 9 % (ref 3–12)
Neutro Abs: 3.1 10*3/uL (ref 1.7–7.7)
Neutrophils Relative %: 51 % (ref 43–77)
Platelets: 253 10*3/uL (ref 150–400)
RBC: 5.35 MIL/uL (ref 4.22–5.81)
RDW: 16.9 % — ABNORMAL HIGH (ref 11.5–15.5)
WBC: 6.1 10*3/uL (ref 4.0–10.5)

## 2013-04-06 LAB — POCT I-STAT TROPONIN I: Troponin i, poc: 0.02 ng/mL (ref 0.00–0.08)

## 2013-04-06 LAB — BASIC METABOLIC PANEL
BUN: 35 mg/dL — ABNORMAL HIGH (ref 6–23)
CO2: 24 mEq/L (ref 19–32)
Calcium: 9.5 mg/dL (ref 8.4–10.5)
Chloride: 107 mEq/L (ref 96–112)
Creatinine, Ser: 1.67 mg/dL — ABNORMAL HIGH (ref 0.50–1.35)
GFR calc Af Amer: 43 mL/min — ABNORMAL LOW (ref >90)
GFR calc non Af Amer: 37 mL/min — ABNORMAL LOW (ref >90)
Glucose, Bld: 106 mg/dL — ABNORMAL HIGH (ref 70–99)
Potassium: 4.7 mEq/L (ref 3.5–5.1)
Sodium: 140 mEq/L (ref 135–145)

## 2013-04-06 LAB — POCT I-STAT, CHEM 8
BUN: 38 mg/dL — ABNORMAL HIGH (ref 6–23)
Calcium, Ion: 1.22 mmol/L (ref 1.13–1.30)
Chloride: 109 mEq/L (ref 96–112)
Creatinine, Ser: 1.8 mg/dL — ABNORMAL HIGH (ref 0.50–1.35)
Glucose, Bld: 103 mg/dL — ABNORMAL HIGH (ref 70–99)
HCT: 49 % (ref 39.0–52.0)
Hemoglobin: 16.7 g/dL (ref 13.0–17.0)
Potassium: 4.8 mEq/L (ref 3.5–5.1)
Sodium: 142 mEq/L (ref 135–145)
TCO2: 24 mmol/L (ref 0–100)

## 2013-04-06 LAB — PHOSPHORUS: Phosphorus: 2.3 mg/dL (ref 2.3–4.6)

## 2013-04-06 LAB — MAGNESIUM: Magnesium: 2.3 mg/dL (ref 1.5–2.5)

## 2013-04-06 NOTE — ED Notes (Signed)
Pacer device interrogated and transmitted

## 2013-04-06 NOTE — ED Notes (Signed)
Pt alert and mentating appropriately upon d/c. Pt given d/c teaching and follow up care instructions. Pt verbalizes understanding of d/c teaching and has no further questions upon d/c. NAD noted upon d/c. Pt leaving with significant other. Pt ambulatory upon d/c leaving with paperwork.

## 2013-04-06 NOTE — ED Notes (Signed)
Delay explained to pt and to pts wife. Notified that they are waiting for cardiology to come down and see pt. Pt notified that he can have something to eat at this time. Pt wife shown to [place] to get food for pt. Pt given crackers and water in meantime while waiting. NAD noted at this time. Pt alert and mentating appropriately.

## 2013-04-06 NOTE — ED Notes (Signed)
Pt c/o SOB and palpitations x 3 days; pt st worse with exertion; pt sts hx of pacemaker

## 2013-04-06 NOTE — ED Notes (Signed)
Martie Lee, RN at bedside interrogating pacemaker

## 2013-04-06 NOTE — ED Notes (Signed)
Pt eating at bedside with wife. NAD noted at this time. Pt alert and mentating appropriately.

## 2013-04-06 NOTE — ED Provider Notes (Signed)
79 year old male, history of pacemaker placement several years ago who presents with a complaint of palpitations. This has been intermittent, gradually worsening and this morning requested to come to the hospital for evaluation. He is a rather stoic gentleman who has no peripheral edema, soft nontender abdomen, clear lung sounds and a hard which appears to be being with normal rate but has bigeminy with frequent PVCs. The patient does not appear in distress, will need workup with labs, chest x-ray, EKG is nonischemic and shows bigeminy with paced rhythm and PVCs, with a discussion with cardiology and interrogation of the pacemaker prior to disposition.    Vida Roller, MD 04/06/13 1043

## 2013-04-06 NOTE — ED Notes (Signed)
Dr Gary Fleet given phone to speak with pacemaker company after interrogation.

## 2013-04-06 NOTE — ED Provider Notes (Signed)
History    CSN: [id number] Arrival date & time 04/06/13  1478  First MD Initiated Contact with Patient 04/06/13 1010     Chief Complaint  Patient presents with  . Shortness of Breath  . Palpitations   (Consider location/radiation/quality/duration/timing/severity/associated sxs/prior Treatment) HPI Comments: Pt w/ hx of complete heart block s/p dual chamber pacer placed in 2005 and replaced in 2013 now w/ palpitations and dyspnea. States 3-4 day hx of above sx. Denies chest pain. Denies significant caffeine, ETOH or drugs. No prior hx of pacer dysfunction. Denies chest pain. No hx of dvt/pe or risk factors thereof. No recent cough, fever, trauma. Denies syncope, dizziness or LH.   Patient is a 79 y.o. male presenting with general illness. The history is provided by the patient. No language interpreter was used.  Illness Location:  Cardio Quality:  Palpitations, dyspnea Severity:  Mild Onset quality:  Gradual Duration:  3 days Timing:  Intermittent Progression:  Worsening Chronicity:  New Associated symptoms: shortness of breath   Associated symptoms: no abdominal pain, no chest pain, no congestion, no cough, no diarrhea, no fever, no headaches, no nausea, no rash, no sore throat and no vomiting    Past Medical History  Diagnosis Date  . Complete heart block 01/23/04    s/p Medtronic PPM implanted by Dr Amil Amen  . Hypertension   . Diabetes mellitus   . COPD (chronic obstructive pulmonary disease)   . Carotid artery occlusion   . Hyperlipidemia   . BPH (benign prostatic hyperplasia)   . Glaucoma   . Cataracts, bilateral   . Kidney stones   . Allergic rhinitis, cause unspecified   . CAD (coronary artery disease)   . Chronic kidney disease (CKD), stage III (moderate)    Past Surgical History  Procedure Laterality Date  . Pacemaker insertion  2005, 04/16/12    MDT implanted by Dr Amil Amen with generator chnage (MDT Adapta L) by Dr Johney Frame 04/16/12   Family History  Problem  Relation Age of Onset  . Heart disease Father   . Heart attack Father    History  Substance Use Topics  . Smoking status: Former Smoker    Types: Cigarettes    Quit date: 09/16/1998  . Smokeless tobacco: Never Used  . Alcohol Use: 3.0 oz/week    2 Glasses of wine, 3 Shots of liquor per week     Comment: drinks a tequila shot daily    Review of Systems  Constitutional: Negative for fever and chills.  HENT: Negative for congestion and sore throat.   Respiratory: Positive for shortness of breath. Negative for cough.   Cardiovascular: Positive for palpitations. Negative for chest pain and leg swelling.  Gastrointestinal: Negative for nausea, vomiting, abdominal pain, diarrhea and constipation.  Genitourinary: Negative for dysuria and frequency.  Skin: Negative for color change and rash.  Neurological: Negative for dizziness and headaches.  Psychiatric/Behavioral: Negative for confusion and agitation.  All other systems reviewed and are negative.    Allergies  Penicillins  Home Medications   Current Outpatient Rx  Name  Route  Sig  Dispense  Refill  . aspirin EC 81 MG tablet   Oral   Take 81 mg by mouth daily.         [name] Kitchen atorvastatin (LIPITOR) 40 MG tablet   Oral   Take 40 mg by mouth daily.           . cetirizine (ZYRTEC) 10 MG tablet   Oral   Take 10 mg  by mouth daily as needed. For allergies         . fosinopril (MONOPRIL) 40 MG tablet   Oral   Take 40 mg by mouth daily.           [name] Kitchen glipiZIDE (GLUCOTROL) 5 MG tablet   Oral   Take 5 mg by mouth daily before breakfast.           . pioglitazone (ACTOS) 15 MG tablet   Oral   Take 15 mg by mouth daily.            BP 148/56  Pulse 60  Temp(Src) 97.6 F (36.4 C) (Oral)  Resp 18  SpO2 97% Physical Exam  Constitutional: He is oriented to person, place, and time. He appears well-developed and well-nourished. No distress.  HENT:  Head: Normocephalic and atraumatic.  Eyes: EOM are normal. Pupils are  equal, round, and reactive to light.  Neck: Normal range of motion. Neck supple.  Cardiovascular: Normal rate and regular rhythm.  Frequent extrasystoles are present. Exam reveals distant heart sounds.   Pulses:      Radial pulses are 2+ on the right side, and 2+ on the left side.  Pulmonary/Chest: Effort normal and breath sounds normal. No respiratory distress.  Abdominal: Soft. He exhibits no distension. There is no tenderness.  Musculoskeletal: Normal range of motion. He exhibits no edema.  Neurological: He is alert and oriented to person, place, and time.  Skin: Skin is warm and dry.  Psychiatric: He has a normal mood and affect. His behavior is normal.    ED Course  Procedures (including critical care time) Labs Reviewed  CBC WITH DIFFERENTIAL  BASIC METABOLIC PANEL  MAGNESIUM  PHOSPHORUS   Date: 04/06/2013  Rate: 70  Rhythm: paced  QRS Axis: left  Intervals: QTc 546, no PR interval 2/2 paced rhythm  ST/T Wave abnormalities: nonspecific ST/T changes  Conduction Disutrbances:nonspecific intraventricular conduction delay  Narrative Interpretation: frequent ectopy - bigeminy  Old EKG Reviewed: changes noted Results for orders placed during the hospital encounter of 04/06/13  CBC WITH DIFFERENTIAL      Result Value Range   WBC 6.1  4.0 - 10.5 K/uL   RBC 5.35  4.22 - 5.81 MIL/uL   Hemoglobin 14.7  13.0 - 17.0 g/dL   HCT 16.1  09.6 - 04.5 %   MCV 81.9  78.0 - 100.0 fL   MCH 27.5  26.0 - 34.0 pg   MCHC 33.6  30.0 - 36.0 g/dL   RDW 40.9 (*) 81.1 - 91.4 %   Platelets 253  150 - 400 K/uL   Neutrophils Relative % 51  43 - 77 %   Neutro Abs 3.1  1.7 - 7.7 K/uL   Lymphocytes Relative 37  12 - 46 %   Lymphs Abs 2.2  0.7 - 4.0 K/uL   Monocytes Relative 9  3 - 12 %   Monocytes Absolute 0.5  0.1 - 1.0 K/uL   Eosinophils Relative 3  0 - 5 %   Eosinophils Absolute 0.2  0.0 - 0.7 K/uL   Basophils Relative 0  0 - 1 %   Basophils Absolute 0.0  0.0 - 0.1 K/uL  BASIC METABOLIC PANEL       Result Value Range   Sodium 140  135 - 145 mEq/L   Potassium 4.7  3.5 - 5.1 mEq/L   Chloride 107  96 - 112 mEq/L   CO2 24  19 - 32 mEq/L   Glucose, Bld  106 (*) 70 - 99 mg/dL   BUN 35 (*) 6 - 23 mg/dL   Creatinine, Ser 1.61 (*) 0.50 - 1.35 mg/dL   Calcium 9.5  8.4 - 09.6 mg/dL   GFR calc non Af Amer 37 (*) >90 mL/min   GFR calc Af Amer 43 (*) >90 mL/min  MAGNESIUM      Result Value Range   Magnesium 2.3  1.5 - 2.5 mg/dL  PHOSPHORUS      Result Value Range   Phosphorus 2.3  2.3 - 4.6 mg/dL  POCT I-STAT, CHEM 8      Result Value Range   Sodium 142  135 - 145 mEq/L   Potassium 4.8  3.5 - 5.1 mEq/L   Chloride 109  96 - 112 mEq/L   BUN 38 (*) 6 - 23 mg/dL   Creatinine, Ser 0.45 (*) 0.50 - 1.35 mg/dL   Glucose, Bld 409 (*) 70 - 99 mg/dL   Calcium, Ion 8.11  9.14 - 1.30 mmol/L   TCO2 24  0 - 100 mmol/L   Hemoglobin 16.7  13.0 - 17.0 g/dL   HCT 78.2  95.6 - 21.3 %  POCT I-STAT TROPONIN I      Result Value Range   Troponin i, poc 0.02  0.00 - 0.08 ng/mL   Comment 3            DG Chest 2 View (Final result)  Result time: 04/06/13 11:17:23    Final result by Rad Results In Interface (04/06/13 11:17:23)    Narrative:   *RADIOLOGY REPORT*  Clinical Data: Shortness of breath. Palpitations.  CHEST - 2 VIEW  Comparison: Chest x-ray 08/10/2011.  Findings: Lung volumes are normal. No consolidative airspace disease. No pleural effusions. No pneumothorax. No pulmonary nodule or mass noted. Pulmonary vasculature and the cardiomediastinal silhouette are within normal limits. Atherosclerosis in the thoracic aorta. Old healed right-sided rib fractures are again noted. Right-sided pacemaker device in place with lead tips projecting over the expected location of the right atrium and right ventricular apex.  IMPRESSION: 1. No radiographic evidence of acute cardiopulmonary disease. 2. Atherosclerosis. 3. Additional incidental findings, as above, similar to the  prior examination.   Original Report Authenticated By: Trudie Reed, M.D.             No results found. No diagnosis found.  MDM  Exam as above, NAD, well appearing, no resp distress or hypoxia. Pulse 60, normotensive. Frequent ectopy on monitor. ECG - paced rhythm w/ ectopy - bigeminy, no acute ischemia. CXR w/ good lead placement - NACPF, Mg/PO4 and K normal, troponin negative, renal function at baseline, no anemia. Pacer interrogated - no acute malfunction, battery life reassuring. D/w cardiology - Dr Mayford Knife and pt will follow up in their clinic this wk for further eval. Pt chest pain free. Denies LH/dizzy, syncope or palpitations. Stable for d/c home. Given return precautions. D/c in good and stable condition. At this time I  Doubt PE - low risk wells and PERC + only for age. Doubt ACS, spont ptx, tamponade or dissection.  I have personally reviewed labs and imaging and considered in my MDM. Case d/w Dr Hyacinth Meeker  1. Bigeminy   2. Palpitations   3. Dyspnea    Quintella Reichert, MD [address] [place] [address] [phone]  Schedule an appointment as soon as possible for a visit she will see you in follow up this week       Audelia Hives, MD  04/06/13 1530 

## 2013-04-07 NOTE — ED Provider Notes (Signed)
I saw and evaluated the patient, reviewed the resident's note and I agree with the findings and plan. I have seen and interpreted the ECG and agree with the interpretation as provided by the resident Please see my separate note regarding my evaluation of the patient.   Vida Roller, MD 04/07/13 [phone]

## 2013-06-30 ENCOUNTER — Encounter: Payer: Self-pay | Admitting: *Deleted

## 2013-08-23 ENCOUNTER — Ambulatory Visit (INDEPENDENT_AMBULATORY_CARE_PROVIDER_SITE_OTHER): Payer: Medicare Other | Admitting: *Deleted

## 2013-08-23 DIAGNOSIS — I442 Atrioventricular block, complete: Secondary | ICD-10-CM

## 2013-08-23 DIAGNOSIS — Z950 Presence of cardiac pacemaker: Secondary | ICD-10-CM

## 2013-09-01 LAB — MDC_IDC_ENUM_SESS_TYPE_REMOTE
Battery Impedance: 136 Ohm
Battery Remaining Longevity: 118 mo
Battery Voltage: 2.79 V
Brady Statistic AP VP Percent: 68 %
Brady Statistic AP VS Percent: 0 %
Brady Statistic AS VP Percent: 31 %
Brady Statistic AS VS Percent: 0 %
Date Time Interrogation Session: 20141208140751
Lead Channel Impedance Value: 410 Ohm
Lead Channel Impedance Value: 576 Ohm
Lead Channel Pacing Threshold Amplitude: 0.5 V
Lead Channel Pacing Threshold Amplitude: 1 V
Lead Channel Pacing Threshold Pulse Width: 0.4 ms
Lead Channel Pacing Threshold Pulse Width: 0.4 ms
Lead Channel Sensing Intrinsic Amplitude: 2.8 mV
Lead Channel Setting Pacing Amplitude: 1.5 V
Lead Channel Setting Pacing Amplitude: 2.5 V
Lead Channel Setting Pacing Pulse Width: 0.4 ms
Lead Channel Setting Sensing Sensitivity: 4 mV

## 2013-09-17 ENCOUNTER — Encounter: Payer: Self-pay | Admitting: *Deleted

## 2013-09-30 ENCOUNTER — Encounter: Payer: Self-pay | Admitting: Internal Medicine

## 2013-12-13 ENCOUNTER — Encounter: Payer: Self-pay | Admitting: Internal Medicine

## 2013-12-13 ENCOUNTER — Ambulatory Visit (INDEPENDENT_AMBULATORY_CARE_PROVIDER_SITE_OTHER): Payer: Medicare Other | Admitting: Internal Medicine

## 2013-12-13 VITALS — BP 136/74 | HR 86 | Ht 68.0 in | Wt 159.0 lb

## 2013-12-13 DIAGNOSIS — I2510 Atherosclerotic heart disease of native coronary artery without angina pectoris: Secondary | ICD-10-CM

## 2013-12-13 DIAGNOSIS — I10 Essential (primary) hypertension: Secondary | ICD-10-CM

## 2013-12-13 DIAGNOSIS — I442 Atrioventricular block, complete: Secondary | ICD-10-CM

## 2013-12-13 LAB — MDC_IDC_ENUM_SESS_TYPE_INCLINIC
Battery Impedance: 160 Ohm
Battery Remaining Longevity: 108 mo
Battery Voltage: 2.79 V
Brady Statistic AP VP Percent: 65 %
Brady Statistic AP VS Percent: 0 %
Brady Statistic AS VP Percent: 34 %
Brady Statistic AS VS Percent: 0 %
Date Time Interrogation Session: 20150330124513
Lead Channel Impedance Value: 409 Ohm
Lead Channel Impedance Value: 563 Ohm
Lead Channel Pacing Threshold Amplitude: 0.5 V
Lead Channel Pacing Threshold Amplitude: 1 V
Lead Channel Pacing Threshold Pulse Width: 0.4 ms
Lead Channel Pacing Threshold Pulse Width: 0.4 ms
Lead Channel Sensing Intrinsic Amplitude: 2 mV
Lead Channel Setting Pacing Amplitude: 2 V
Lead Channel Setting Pacing Amplitude: 2.75 V
Lead Channel Setting Pacing Pulse Width: 0.4 ms
Lead Channel Setting Sensing Sensitivity: 4 mV

## 2013-12-13 NOTE — Patient Instructions (Signed)
Your physician wants you to follow-up in: 12 months with Hershal CoriaBrooke Edmisten,NP You will receive a reminder letter in the mail two months in advance. If you don't receive a letter, please call our office to schedule the follow-up appointment.    Remote monitoring is used to monitor your Pacemaker or ICD from home. This monitoring reduces the number of office visits required to check your device to one time per year. It allows us to keep an eye on the functioning of your device to ensure it is working properly. You are scheduled for a device check from home on 03/28/14. You may send your transmission at any time that day. If you have a wireless device, the transmission will be sent automatically. After your physician reviews your transmission, you will receive a postcard with your next transmission date.

## 2013-12-13 NOTE — Progress Notes (Addendum)
PCP: Katy ApoPOLITE,RONALD D, MD Primary Cardiologist:  Dr Loma Senderurner  Edward Suarez is a 80 y.o. male who presents today for routine electrophysiology followup.  Since his last visit to see me, the patient reports doing very well.  He remains active and [facility] without [facility]. Today, he denies symptoms of palpitations, chest pain, shortness of breath,  lower extremity edema, dizziness, presyncope, or syncope.  The patient is otherwise without complaint today.   Past Medical History  Diagnosis Date  . Complete heart block 01/23/04    s/p Medtronic PPM implanted by Dr Amil AmenEdmunds  . Hypertension   . Diabetes mellitus   . COPD (chronic obstructive pulmonary disease)   . Carotid artery occlusion   . Hyperlipidemia   . BPH (benign prostatic hyperplasia)   . Glaucoma   . Cataracts, bilateral   . Kidney stones   . Allergic rhinitis, cause unspecified   . CAD (coronary artery disease)   . Chronic kidney disease (CKD), stage III (moderate)    Past Surgical History  Procedure Laterality Date  . Pacemaker insertion  2005, 04/16/12    MDT implanted by Dr Amil AmenEdmunds with generator chnage (MDT Adapta L) by Dr Johney FrameAllred 04/16/12    Current Outpatient Prescriptions  Medication Sig Dispense Refill  . aspirin EC 81 MG tablet Take 81 mg by mouth daily.      [name] Kitchen. atorvastatin (LIPITOR) 40 MG tablet Take 40 mg by mouth every evening.       . cetirizine (ZYRTEC) 10 MG tablet Take 10 mg by mouth daily. For allergies      . fosinopril (MONOPRIL) 40 MG tablet Take 40 mg by mouth daily.       [name] Kitchen. glipiZIDE (GLUCOTROL) 5 MG tablet Take 5 mg by mouth daily before breakfast.        . pioglitazone (ACTOS) 15 MG tablet Take 15 mg by mouth daily.         No current facility-administered medications for this visit.    Physical Exam: Filed Vitals:   12/13/13 1224  BP: 136/74  Pulse: 86  Height: 5\' 8"  (1.727 m)  Weight: 159 lb (72.122 kg)    GEN- The patient is well appearing, alert and oriented x 3 today.    Head- normocephalic, atraumatic Eyes-  Sclera clear, conjunctiva pink Ears- hearing intact Oropharynx- clear Lungs- Clear to ausculation bilaterally, normal work of breathing Chest- pacemaker pocket is well healed Heart- Regular rate and rhythm, no murmurs, rubs or gallops, PMI not laterally displaced GI- soft, NT, ND, + BS Extremities- no clubbing, cyanosis, or edema  Pacemaker interrogation- reviewed in detail today,  See PACEART report  Assessment and Plan:  Atrioventricular block, complete    Normal pacemaker function See Pace Art report No changes today  CAD (coronary artery disease)  No ischemic symptoms  No changes  Hypertension  Stable  No change required today  AFib Device interrogation reveals brief afib (longest episode 1 min 23 seconds) We will monitor going forward.  If his afib burden increases then we may need to consider anticoagulation  carelink Return to see Nehemiah SettleBrooke in 1 year Follow-up with Dr Mayford Knifeurner as scheduled

## 2014-03-28 ENCOUNTER — Ambulatory Visit (INDEPENDENT_AMBULATORY_CARE_PROVIDER_SITE_OTHER): Payer: Medicare Other | Admitting: *Deleted

## 2014-03-28 DIAGNOSIS — I442 Atrioventricular block, complete: Secondary | ICD-10-CM

## 2014-03-28 DIAGNOSIS — Z950 Presence of cardiac pacemaker: Secondary | ICD-10-CM

## 2014-03-28 LAB — MDC_IDC_ENUM_SESS_TYPE_REMOTE
Battery Impedance: 160 Ohm
Battery Remaining Longevity: 107 mo
Battery Voltage: 2.79 V
Brady Statistic AP VP Percent: 81 %
Brady Statistic AP VS Percent: 0 %
Brady Statistic AS VP Percent: 19 %
Brady Statistic AS VS Percent: 0 %
Date Time Interrogation Session: 20150713115435
Lead Channel Impedance Value: 404 Ohm
Lead Channel Impedance Value: 573 Ohm
Lead Channel Pacing Threshold Amplitude: 0.625 V
Lead Channel Pacing Threshold Amplitude: 0.875 V
Lead Channel Pacing Threshold Pulse Width: 0.4 ms
Lead Channel Pacing Threshold Pulse Width: 0.4 ms
Lead Channel Setting Pacing Amplitude: 2 V
Lead Channel Setting Pacing Amplitude: 2.5 V
Lead Channel Setting Pacing Pulse Width: 0.4 ms
Lead Channel Setting Sensing Sensitivity: 4 mV

## 2014-03-29 NOTE — Progress Notes (Signed)
Remote pacemaker transmission.   

## 2014-04-06 ENCOUNTER — Encounter: Payer: Self-pay | Admitting: Cardiology

## 2014-04-08 ENCOUNTER — Encounter: Payer: Self-pay | Admitting: Internal Medicine

## 2014-05-02 ENCOUNTER — Ambulatory Visit: Payer: Medicare Other | Admitting: Cardiology

## 2014-05-04 DIAGNOSIS — E1121 Type 2 diabetes mellitus with diabetic nephropathy: Secondary | ICD-10-CM | POA: Insufficient documentation

## 2014-05-04 DIAGNOSIS — R413 Other amnesia: Secondary | ICD-10-CM | POA: Insufficient documentation

## 2014-05-05 ENCOUNTER — Encounter: Payer: Self-pay | Admitting: Cardiology

## 2014-05-05 ENCOUNTER — Ambulatory Visit (INDEPENDENT_AMBULATORY_CARE_PROVIDER_SITE_OTHER): Payer: Medicare Other | Admitting: Cardiology

## 2014-05-05 VITALS — BP 140/76 | HR 78 | Wt 160.0 lb

## 2014-05-05 DIAGNOSIS — Z950 Presence of cardiac pacemaker: Secondary | ICD-10-CM

## 2014-05-05 DIAGNOSIS — I493 Ventricular premature depolarization: Secondary | ICD-10-CM

## 2014-05-05 DIAGNOSIS — I442 Atrioventricular block, complete: Secondary | ICD-10-CM

## 2014-05-05 DIAGNOSIS — I4949 Other premature depolarization: Secondary | ICD-10-CM

## 2014-05-05 DIAGNOSIS — I10 Essential (primary) hypertension: Secondary | ICD-10-CM

## 2014-05-05 NOTE — Patient Instructions (Signed)
Your physician recommends that you continue on your current medications as directed. Please refer to the Current Medication list given to you today.  Your physician wants you to follow-up in: 1 year with Dr. Turner. You will receive a reminder letter in the mail two months in advance. If you don't receive a letter, please call our office to schedule the follow-up appointment.  

## 2014-05-05 NOTE — Progress Notes (Signed)
[address] [place], [place]  [address] Phone: [phone] Fax:  [phone]) [phone]  Date:  05/05/2014   ID:  Edward RoyalSheldon I Bonsall, DOB [birth year], MRN [id number]  PCP:  Katy ApoPOLITE,RONALD D, MD  Cardiologist:  Armanda Magicraci Medina Degraffenreid, MD     History of Present Illness: Edward Suarez is a 80 y.o. male with a history of HTN, DM, dyslipidemia, Complete HB s/p PPM and PVC's who presents today for followup. He is doing well.  He denies any chest pain, SOB, DOE, LE edema, dizziness, palpitations or syncope.   Wt Readings from Last 3 Encounters:  05/05/14 160 lb (72.576 kg)  12/13/13 159 lb (72.122 kg)  07/23/12 159 lb (72.122 kg)     Past Medical History  Diagnosis Date  . Complete heart block 01/23/04    s/p Medtronic PPM implanted by Dr Amil AmenEdmunds  . Hypertension   . Diabetes mellitus   . COPD (chronic obstructive pulmonary disease)   . Carotid artery occlusion   . Hyperlipidemia   . BPH (benign prostatic hyperplasia)   . Glaucoma   . Cataracts, bilateral   . Kidney stones   . Allergic rhinitis, cause unspecified   . CAD (coronary artery disease)   . Chronic kidney disease (CKD), stage III (moderate)     Current Outpatient Prescriptions  Medication Sig Dispense Refill  . aspirin EC 81 MG tablet Take 81 mg by mouth daily.      [name] Kitchen. atorvastatin (LIPITOR) 40 MG tablet Take 40 mg by mouth every evening.       . cetirizine (ZYRTEC) 10 MG tablet Take 10 mg by mouth daily. For allergies      . fosinopril (MONOPRIL) 40 MG tablet Take 40 mg by mouth daily.       [name] Kitchen. glipiZIDE (GLUCOTROL) 5 MG tablet Take 5 mg by mouth daily before breakfast.        . pioglitazone (ACTOS) 15 MG tablet Take 15 mg by mouth daily.         No current facility-administered medications for this visit.    Allergies:    Allergies  Allergen Reactions  . Penicillins Rash    Social History:  The patient  reports that he quit smoking about 15 years ago. His smoking use included Cigarettes. He smoked 0.00 packs per day.  He has never used smokeless tobacco. He reports that he drinks about 3 ounces of alcohol per week. He reports that he does not use illicit drugs.   Family History:  The patient's family history includes Heart attack in his father; Heart disease in his father.   ROS:  Please see the history of present illness.      All other systems reviewed and negative.   PHYSICAL EXAM: VS:  BP 140/76  Pulse 78  Wt 160 lb (72.576 kg) Well nourished, well developed, in no acute distress HEENT: normal Neck: no JVD Cardiac:  normal S1, S2; RRR; no murmur Lungs:  clear to auscultation bilaterally, no wheezing, rhonchi or rales Abd: soft, nontender, no hepatomegaly Ext: no edema Skin: warm and dry Neuro:  CNs 2-12 intact, no focal abnormalities noted  EKG:   AV paced rhythm    ASSESSMENT AND PLAN:  1. Complete heart block s/p PPM 2. HTN well controlled - continue Monopril 3. PVC's asymptomatic 4. Mild LV dysfunction EF 45-50% with diastolic dysfunction and abnormal septal wall motion due to paced rhythm  Followup with me in 1 year  Signed, Armanda Magicraci Letica Giaimo, MD 05/05/2014 9:18 AM

## 2014-06-15 ENCOUNTER — Ambulatory Visit (INDEPENDENT_AMBULATORY_CARE_PROVIDER_SITE_OTHER): Payer: Medicare Other | Admitting: Neurology

## 2014-06-15 ENCOUNTER — Encounter: Payer: Self-pay | Admitting: Neurology

## 2014-06-15 ENCOUNTER — Encounter (INDEPENDENT_AMBULATORY_CARE_PROVIDER_SITE_OTHER): Payer: Self-pay

## 2014-06-15 VITALS — BP 136/75 | HR 86 | Resp 14 | Ht 67.25 in | Wt 159.0 lb

## 2014-06-15 DIAGNOSIS — G988 Other disorders of nervous system: Secondary | ICD-10-CM

## 2014-06-15 DIAGNOSIS — R0609 Other forms of dyspnea: Secondary | ICD-10-CM

## 2014-06-15 DIAGNOSIS — E0849 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: Secondary | ICD-10-CM

## 2014-06-15 DIAGNOSIS — G3184 Mild cognitive impairment, so stated: Secondary | ICD-10-CM

## 2014-06-15 DIAGNOSIS — E1349 Other specified diabetes mellitus with other diabetic neurological complication: Secondary | ICD-10-CM

## 2014-06-15 DIAGNOSIS — I442 Atrioventricular block, complete: Secondary | ICD-10-CM

## 2014-06-15 DIAGNOSIS — R413 Other amnesia: Secondary | ICD-10-CM | POA: Insufficient documentation

## 2014-06-15 DIAGNOSIS — R0989 Other specified symptoms and signs involving the circulatory and respiratory systems: Secondary | ICD-10-CM

## 2014-06-15 DIAGNOSIS — R0683 Snoring: Secondary | ICD-10-CM

## 2014-06-15 MED ORDER — MOMETASONE FUROATE 50 MCG/ACT NA SUSP: 2.0000 | Freq: Every day | NASAL | Status: DC

## 2014-06-15 NOTE — Addendum Note (Signed)
Addended by: Melvyn NovasHMEIER, Catherene Kaleta on: 06/15/2014 10:52 AM   Modules accepted: Orders

## 2014-06-15 NOTE — Patient Instructions (Signed)
Mild Neurocognitive Disorder  Mild neurocognitive disorder (formerly known as mild cognitive impairment) is a mental disorder. It is a slight abnormal decrease in mental function. The areas of mental function affected may include memory, thought, communication, behavior, and completion of tasks. The decrease is noticeable and measurable but for the most part does not interfere with your daily activities.  Mild neurocognitive disorder typically occurs in people older than 60 years but can occur earlier. It is not as serious as major neurocognitive disorder (formerly known as dementia) but may lead to a more serious neurocognitive disorder. However, in some cases the condition does not get worse. A few people with this disorder even improve.  CAUSES   There are a number of different causes of mild neurocognitive disorder:   · Brain disorders associated with abnormal protein deposits, such as Alzheimer's disease, Pick's disease, and Lewy body disease.  · Brain disorders associated with abnormal movement, such as Parkinson's disease and Huntington's disease.  · Diseases affecting blood vessels in the brain and resulting in mini-strokes.  · Certain infections, such as human immunodeficiency virus (HIV) infection.  · Traumatic brain injury.  · Other medical conditions such as brain tumors, underactive thyroid (hypothyroidism), and vitamin B12 deficiency.  · Use of certain prescription medicine and "recreational" drugs.  SYMPTOMS   Symptoms of mild neurocognitive disorder include:  · Difficulty remembering. You may forget details of recent events, names, or phone numbers. You may forget important social events and appointments or repeatedly forget where you put your car keys.   · Difficulty thinking and solving problems. You may have trouble with complex tasks such as paying bills or driving in unfamiliar locations.   · Difficulty communicating. You may have trouble finding the right word, naming an object, forming a  sentence that makes sense, or understanding what you read or hear.  · Changes in your behavior or personality. You may lose interest in the things that you used to enjoy or withdraw from social situations. You may get angry more easily than usual. You may act before thinking. You may do things in public that you would not usually do. You may hear or see things that are not real (hallucinations). You may believe falsely that others are trying to hurt you (paranoia).  DIAGNOSIS  Mild neurocognitive disorder is diagnosed through an assessment by your health care provider. Your health care provider will ask you and your family, friends, or coworkers questions about your symptoms. He or she will ask how often the symptoms occur, how long they have been occurring, whether they are getting worse, and the effect they are having on your life. Your health care provider may refer you to a neurologist or mental health specialist for a detailed evaluation of your mental functions (neuropsychological testing).   To identify the cause of your mild neurocognitive disorder, your health care provider may:  · Obtain a detailed medical history.  · Ask about alcohol and drug use, including prescription medicine.  · Perform a physical exam.  · Order blood tests and brain imaging exams.  TREATMENT   Mild neurocognitive disorder caused by infections, use of certain medicines or "recreational" drugs, and certain medical conditions may improve with treatment of the condition that is causing the disorder.  Mild neurocognitive disorder resulting from other causes generally does not improve and may worsen. In these cases, the goal of treatment is to slow progression of the disorder and help you cope with the loss of mental function. Treatments in these cases   include:   · Medicine. Medicine helps mainly with memory loss and behavioral symptoms.    · Talk therapy. Talk therapy provides education, emotional support, memory aids, and other ways of  making up for decreases in mental function.       · Lifestyle changes. These include regular exercise, a healthy diet (including essential omega-3 fatty acids), intellectual stimulation, and increased social interaction.  Document Released: 05/05/2013 Document Revised: 01/17/2014 Document Reviewed: 05/05/2013  ExitCare® Patient Information ©2015 [employer]. This information is not intended to replace advice given to you by your health care provider. Make sure you discuss any questions you have with your health care provider.

## 2014-06-15 NOTE — Progress Notes (Signed)
Provider:  Melvyn Novasarmen  Hovanes Hymas, M D  Referring Provider: Katy ApoPolite, Ronald D, MD Primary Care Physician:  Katy ApoPOLITE,RONALD D, MD  Chief Complaint  Patient presents with  . New Evaluation    RM 10  . Memory Loss    HPI:  Edward Suarez is a 80 y.o. male, who is seen here as a referral from Dr. Nehemiah SettlePolite for Memory loss.    Edward Suarez is seen here today as an inpatient referred by Dr. Nehemiah SettlePolite, he has a past medical history of allergies (runny nose) and has diabetes and CKD 3 . He had a pacemaker implanted for heart block  in 2005 and a redo in 2013. Mrs. Edward Suarez some concern for memory difficulties and a Mini-Mental Status exam was performed on 05-04-14 and Dr. Lynnette CaffeyPolites' office. He scored 25 of 30 points.   Multiple metabolic labs were ordered and the patient  had a slightly elevated HbA1c at 6.7 -significant factor for memory loss.  He does have chronic kidney disease is in relation to his diabetes, which Dr. Nehemiah SettlePolite graded 3. And he has hyperlipidemia, albeit well controlled. This creatinine level was 1.76. Normal sodium , normal potassium,  cholesterol total was 163 - is well controlled.  Vitamin B12  and folic acid were normal. The patient reports his memory loss as a form of absent mindedness. He is a former smoker, quit 15 years ago after 40 plus pack years.    Review of Systems: Out of a complete 14 system review, the patient complains of only the following symptoms, and all other reviewed systems are negative. Short term memory loss.    History   Social History  . Marital Status: Married    Spouse Name: N/A    Number of Children: 2  . Years of Education: BS   Occupational History  . Not on file.   Social History Main Topics  . Smoking status: Former Smoker    Types: Cigarettes    Quit date: 09/16/1998  . Smokeless tobacco: Never Used  . Alcohol Use: 3.0 oz/week    2 Glasses of wine, 3 Shots of liquor per week     Comment: drinks a tequila shot daily  . Drug Use: No  .  Sexual Activity: Not on file   Other Topics Concern  . Not on file   Social History Narrative   Retired Mudloggerbagel bakery owner.  [place]   Patient is married with 2 children.   Patient is right handed.   Patient has BS degree.   Patient drinks 1 cup daily.          Family History  Problem Relation Age of Onset  . Heart disease Father   . Heart attack Father     Past Medical History  Diagnosis Date  . Complete heart block 01/23/04    s/p Medtronic PPM implanted by Dr Amil AmenEdmunds  . Hypertension   . Diabetes mellitus   . COPD (chronic obstructive pulmonary disease)   . Carotid artery occlusion   . Hyperlipidemia   . BPH (benign prostatic hyperplasia)   . Glaucoma   . Cataracts, bilateral   . Kidney stones   . Allergic rhinitis, cause unspecified   . CAD (coronary artery disease)   . Chronic kidney disease (CKD), stage III (moderate)     Past Surgical History  Procedure Laterality Date  . Pacemaker insertion  2005, 04/16/12    MDT implanted by Dr Amil AmenEdmunds with generator chnage (MDT Adapta  L) by Dr Johney Frame 04/16/12    Current Outpatient Prescriptions  Medication Sig Dispense Refill  . aspirin EC 81 MG tablet Take 81 mg by mouth daily.      [name] Kitchen atorvastatin (LIPITOR) 40 MG tablet Take 40 mg by mouth every evening.       . cetirizine (ZYRTEC) 10 MG tablet Take 10 mg by mouth daily. For allergies      . fosinopril (MONOPRIL) 40 MG tablet Take 40 mg by mouth daily.       [name] Kitchen glipiZIDE (GLUCOTROL) 5 MG tablet Take 5 mg by mouth daily before breakfast.        . pioglitazone (ACTOS) 15 MG tablet Take 15 mg by mouth daily.         No current facility-administered medications for this visit.    Allergies as of 06/15/2014 - Review Complete 06/15/2014  Allergen Reaction Noted  . Penicillins Rash 08/10/2011    Vitals: BP 136/75  Pulse 86  Resp 14  Ht 5' 7.25" (1.708 m)  Wt 159 lb (72.122 kg)  BMI 24.72 kg/m2 Last Weight:  Wt Readings from Last 1 Encounters:  06/15/14  159 lb (72.122 kg)   Last Height:   Ht Readings from Last 1 Encounters:  06/15/14 5' 7.25" (1.708 m)    Physical exam:  General: The patient is awake, alert and appears not in acute distress. The patient is well groomed. Head: Normocephalic, atraumatic. Neck is supple. Mallampati  is 4/ 5  No visible uvula and large tongue, retrognathia.  -16.75 , neck circumference.  Chronic nasal congestion.  Cardiovascular:  Regular rate and rhythm , without  murmurs or carotid bruit, and without distended neck veins. Respiratory: Lungs are clear to auscultation. Skin:  Without evidence of edema, or rash Trunk: patient has normal posture.  Neurologic exam : The patient is awake and alert, oriented to place and time.   Memory subjective described as intact.  There is a normal attention span & concentration ability. Speech is fluent without dysarthria, mild dysphonia , not  aphasia.  Mood and affect are appropriate.  Cranial nerves: Pupils are equal and briskly reactive to light. Funduscopic exam without  evidence of pallor or edema. Extraocular movements  in vertical and horizontal planes intact and without nystagmus.  Visual fields by finger perimetry are intact. Hearing to finger rub intact.  Facial sensation intact to fine touch. Facial motor strength- left eye droop, ptosis .  Lower face  symmetric and tongue  move midline. He had shingles in the face.   Tongue protrusion into either cheek is normal. Shoulder shrug is normal.   Motor exam:   Normal tone ,muscle bulk and symmetric  strength in all extremities.  Sensory:  Fine touch, pinprick and vibration were tested in all extremities. He has lost vibration from midcalve level down bilaterally.  Coordination: Rapid alternating movements in the fingers/hands were normal. Finger-to-nose maneuver  normal without evidence of ataxia, dysmetria or tremor.  Gait and station: Patient walks without assistive device and is able unassisted to climb up to  the exam table.  Strength within normal limits. Stance is stable and normal. Tandem gait is unfragmented. Romberg testing is negative   Deep tendon reflexes: in the  upper and lower extremities are attenuated.  Babinski maneuver response is downgoing.   Assessment:  After physical and neurologic examination, review of laboratory studies, imaging, neurophysiology testing and pre-existing records, assessment is that of :  This patient has mild cognitive impairment, MOCA 26-30 ,  all points lost in 5 word recall.  Diabetic neuropathy is evident.  The patient has untreated OSA, He has chronic rhinitis.   Plan:  Treatment plan and additional workup :  Not an indication for aricept yet, follow in 6 month. MOCA SPLIT study by medicare criteria.  Start AHI 15 , score at 4 % , no CO2 needed. RV after sleep study.         Porfirio Mylar Aurthur Wingerter MD 06/15/2014

## 2014-06-22 ENCOUNTER — Ambulatory Visit
Admission: RE | Admit: 2014-06-22 | Discharge: 2014-06-22 | Disposition: A | Discharge status: Home / Self Care | Payer: Medicare Other | Source: Ambulatory Visit | Attending: Neurology | Admitting: Neurology

## 2014-06-22 DIAGNOSIS — G3184 Mild cognitive impairment, so stated: Secondary | ICD-10-CM

## 2014-06-22 DIAGNOSIS — R413 Other amnesia: Secondary | ICD-10-CM

## 2014-06-22 DIAGNOSIS — I442 Atrioventricular block, complete: Secondary | ICD-10-CM

## 2014-06-22 DIAGNOSIS — E0849 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: Secondary | ICD-10-CM

## 2014-06-22 DIAGNOSIS — R0683 Snoring: Secondary | ICD-10-CM

## 2014-06-29 ENCOUNTER — Ambulatory Visit (INDEPENDENT_AMBULATORY_CARE_PROVIDER_SITE_OTHER): Payer: Medicare Other | Admitting: *Deleted

## 2014-06-29 DIAGNOSIS — I442 Atrioventricular block, complete: Secondary | ICD-10-CM

## 2014-06-29 NOTE — Progress Notes (Signed)
Remote pacemaker transmission.   

## 2014-07-03 LAB — MDC_IDC_ENUM_SESS_TYPE_REMOTE
Battery Impedance: 184 Ohm
Battery Remaining Longevity: 103 mo
Battery Voltage: 2.79 V
Brady Statistic AP VP Percent: 82 %
Brady Statistic AP VS Percent: 0 %
Brady Statistic AS VP Percent: 18 %
Brady Statistic AS VS Percent: 0 %
Date Time Interrogation Session: 20151014122315
Lead Channel Impedance Value: 404 Ohm
Lead Channel Impedance Value: 601 Ohm
Lead Channel Pacing Threshold Amplitude: 0.5 V
Lead Channel Pacing Threshold Amplitude: 0.875 V
Lead Channel Pacing Threshold Pulse Width: 0.4 ms
Lead Channel Pacing Threshold Pulse Width: 0.4 ms
Lead Channel Setting Pacing Amplitude: 2 V
Lead Channel Setting Pacing Amplitude: 2.5 V
Lead Channel Setting Pacing Pulse Width: 0.4 ms
Lead Channel Setting Sensing Sensitivity: 4 mV

## 2014-07-14 ENCOUNTER — Telehealth: Payer: Self-pay | Admitting: *Deleted

## 2014-07-14 ENCOUNTER — Encounter: Payer: Self-pay | Admitting: Cardiology

## 2014-07-14 NOTE — Telephone Encounter (Signed)
Message copied by Hermenia FiscalYOUNG, Lunah Losasso on Thu Jul 14, 2014  4:45 PM ------      Message from: [facility] - EatDOHMEIER, CARMEN      Created: Thu Jun 30, 2014  8:47 AM       Mild Generalized atrophy is seen in many patients with memory loss, and is not diagnostic- that finding is seen with age .        However ,  Atrophy in the peri-sylvian region is the most likely to correlate with memory loss. When we meet after your sleep study we need to look at the results of the imaging study and the sleep study in context.       It was nice to see and your wife you at the festival !      See you soon, C. Dohmeier ------

## 2014-07-14 NOTE — Telephone Encounter (Signed)
LMVM for pt on mobile to call back for ct results.

## 2014-07-18 NOTE — Progress Notes (Signed)
Quick Note:  I called pt and relayed note from Dr. Vickey Hugerohmeier relating to CT head). She will go over in more detail when in for sleep f/iu. He relayed understanding. ______

## 2014-07-19 NOTE — Telephone Encounter (Signed)
I spoke to pt and relayed results.

## 2014-07-20 ENCOUNTER — Ambulatory Visit (INDEPENDENT_AMBULATORY_CARE_PROVIDER_SITE_OTHER): Payer: Medicare Other | Admitting: Neurology

## 2014-07-20 DIAGNOSIS — R0902 Hypoxemia: Secondary | ICD-10-CM

## 2014-07-20 DIAGNOSIS — R0683 Snoring: Secondary | ICD-10-CM

## 2014-07-20 DIAGNOSIS — G4761 Periodic limb movement disorder: Secondary | ICD-10-CM

## 2014-07-20 DIAGNOSIS — G478 Other sleep disorders: Secondary | ICD-10-CM

## 2014-07-20 DIAGNOSIS — G4700 Insomnia, unspecified: Secondary | ICD-10-CM

## 2014-07-21 ENCOUNTER — Encounter: Payer: Self-pay | Admitting: Internal Medicine

## 2014-07-27 ENCOUNTER — Telehealth: Payer: Self-pay | Admitting: Neurology

## 2014-07-27 ENCOUNTER — Encounter: Payer: Self-pay | Admitting: Neurology

## 2014-08-10 ENCOUNTER — Encounter: Payer: Self-pay | Admitting: Neurology

## 2014-08-10 ENCOUNTER — Ambulatory Visit (INDEPENDENT_AMBULATORY_CARE_PROVIDER_SITE_OTHER): Payer: Medicare Other | Admitting: Neurology

## 2014-08-10 VITALS — BP 136/71 | HR 86 | Resp 18 | Ht 67.5 in | Wt 160.0 lb

## 2014-08-10 DIAGNOSIS — J309 Allergic rhinitis, unspecified: Secondary | ICD-10-CM | POA: Insufficient documentation

## 2014-08-10 DIAGNOSIS — G4701 Insomnia due to medical condition: Secondary | ICD-10-CM | POA: Insufficient documentation

## 2014-08-10 DIAGNOSIS — E7800 Pure hypercholesterolemia, unspecified: Secondary | ICD-10-CM | POA: Insufficient documentation

## 2014-08-10 HISTORY — DX: Insomnia due to medical condition: G47.01

## 2014-08-10 NOTE — Patient Instructions (Signed)

## 2014-08-10 NOTE — Progress Notes (Signed)
Provider:  Melvyn Novasarmen  Nyomie Ehrlich, M D  Referring Provider: Katy ApoPolite, Ronald D, MD Primary Care Physician:  Katy ApoPOLITE,RONALD D, MD  Chief Complaint  Patient presents with  . Insomnia    follow up sleep study Rm 10,   . Snoring    HPI:  Edward Suarez is a 80 y.o. male, who is seen here as a referral from Dr. Nehemiah SettlePolite for Memory loss.   Edward Suarez is seen here today as an inpatient referred by Dr. Nehemiah SettlePolite, he has a past medical history of allergies (runny nose) and has diabetes and CKD 3 . He had a pacemaker implanted for heart block  in 2005 and a redo in 2013. Edward Suarez Voiced some concern for memory difficulties and a Mini-Mental Status exam was performed on 05-04-14 and Dr. Lynnette CaffeyPolites' office. He scored 25 of 30 points.  Multiple metabolic labs were ordered and the patient  had a slightly elevated HbA1c at 6.7 -significant factor for memory loss.  He does have chronic kidney disease is in relation to his diabetes, which Dr. Nehemiah SettlePolite graded 3. And he has hyperlipidemia, albeit well controlled. This creatinine level was 1.76. Normal sodium , normal potassium,  cholesterol total was 163 - is well controlled.  Vitamin B12  and folic acid were normal. The patient reports his memory loss as a form of absent mindedness. He is a former smoker, quit 15 years ago after 40 plus pack years.   Interval history :  Discuss the results of the recent sleep study dated from 07-20-14. First of all his weight was noted at 200 pounds and is truly 158. Mouth index is a normal range. I would have to correct this on the matrix. His sleep study showed a very poor sleep efficiency meaning that he only slept for 44% of the recorded time. His AHI was 3.3 the RDI was 3.9 in supine sleep position his AHI rose to 5.4 which is still considered normal. Did not have REM sleep, oxygen nadir was 83% with a total time of desaturation of only 11.2 minutes. His sleep was heavily fragmented however and it was clearly that this was in relation to  periodic limb movements the patient had 105 periodic limb movements per hour of sleep and the arousal index was 18.8. The patient has no clinical history of restless leg syndrome so the conclusion is that he either has a spinal stenosis or impinged nerve or circulatory problem involving his lower extremities. He has no history of lower back pain or trouble but he reports cold feet and soles the circulatory component is most likely to be evaluated. Peripheral vascular disease has to be further evaluated, and he is followed by Carolanne Grumblingracy Turner,     Review of Systems: Out of a complete 14 system review, the patient complains of only the following symptoms, and all other reviewed systems are negative. Short term memory loss.    History   Social History  . Marital Status: Married    Spouse Name: N/A    Number of Children: 2  . Years of Education: BS   Occupational History  . Not on file.   Social History Main Topics  . Smoking status: Former Smoker    Types: Cigarettes    Quit date: 09/16/1998  . Smokeless tobacco: Never Used  . Alcohol Use: 3.0 oz/week    2 Glasses of wine, 3 Shots of liquor per week     Comment: drinks a tequila shot daily  . Drug  Use: No  . Sexual Activity: Not on file   Other Topics Concern  . 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   Patient is married with 2 children.   Patient is right handed.   Patient has BS degree.   Patient drinks 1 cup daily.          Family History  Problem Relation Age of Onset  . Heart disease Father   . Heart attack Father     Past Medical History  Diagnosis Date  . Complete heart block 01/23/04    s/p Medtronic PPM implanted by Dr Amil Amen  . Hypertension   . Diabetes mellitus   . COPD (chronic obstructive pulmonary disease)   . Carotid artery occlusion   . Hyperlipidemia   . BPH (benign prostatic hyperplasia)   . Glaucoma   . Cataracts, bilateral   . Kidney stones     . Allergic rhinitis, cause unspecified   . CAD (coronary artery disease)   . Chronic kidney disease (CKD), stage III (moderate)   . Atrial fibrillation, unspecified   . 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 Pacemaker insertion  2005, 04/16/12    MDT implanted by Dr Amil Amen with generator chnage (MDT Adapta L) by Dr Johney Frame 04/16/12    Current Outpatient Prescriptions  Medication Sig Dispense Refill  . aspirin EC 81 MG tablet Take 81 mg by mouth daily.    [name] Kitchen atorvastatin (LIPITOR) 40 MG tablet Take 40 mg by mouth every evening.     . cetirizine (ZYRTEC) 10 MG tablet Take 10 mg by mouth daily. For allergies    . fluticasone (FLONASE) 50 MCG/ACT nasal spray Place 2 sprays into both nostrils daily as needed for allergies or rhinitis.    . fosinopril (MONOPRIL) 40 MG tablet Take 40 mg by mouth daily.     [name] Kitchen glipiZIDE (GLUCOTROL) 5 MG tablet Take 5 mg by mouth daily before breakfast.      . pioglitazone (ACTOS) 15 MG tablet Take 15 mg by mouth daily.       No current facility-administered medications for this visit.    Allergies as of 08/10/2014 - Review Complete 08/10/2014  Allergen Reaction Noted  . Penicillins Rash 08/10/2011    Vitals: BP 136/71 mmHg  Pulse 86  Resp 18  Ht 5' 7.5" (1.715 m)  Wt 160 lb (72.576 kg)  BMI 24.68 kg/m2 Last Weight:  Wt Readings from Last 1 Encounters:  08/10/14 160 lb (72.576 kg)   Last Height:   Ht Readings from Last 1 Encounters:  08/10/14 5' 7.5" (1.715 m)    Physical exam:  General: The patient is awake, alert and appears not in acute distress. The patient is well groomed. Head: Normocephalic, atraumatic. Neck is supple. Mallampati  is 4/ 5  No visible uvula and large tongue, retrognathia.  -16.75 , neck circumference.  Chronic nasal congestion.  Cardiovascular:  Regular rate and rhythm , without  murmurs or carotid bruit, and without distended neck veins. Respiratory: Lungs are  clear to auscultation. Skin:  Without evidence of edema, or rash Trunk: patient has normal posture.  Neurologic exam : The patient is awake and alert, oriented to place and time.   Memory subjective described as intact.  There is a normal attention span & concentration ability. Speech is fluent without dysarthria, mild dysphonia , not  aphasia.  Mood and  affect are appropriate.  Cranial nerves: Pupils are equal and briskly reactive to light. Funduscopic exam without  evidence of pallor or edema. Extraocular movements  in vertical and horizontal planes intact and without nystagmus.  Visual fields by finger perimetry are intact. Hearing to finger rub intact.  Facial sensation intact to fine touch. Facial motor strength- left eye droop, ptosis .  Lower face  symmetric and tongue  move midline. He had shingles in the face.   Tongue protrusion into either cheek is normal. Shoulder shrug is normal.      Assessment:  After physical and neurologic examination, review of laboratory studies, imaging, neurophysiology testing and pre-existing records, assessment is that of :  This patient has mild cognitive impairment, MOCA 26-30 , all points lost in 5 word recall.   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 Diabetic neuropathy is evident. He has cold feet, PVD is likely  The patient has untreated OSA, He has chronic rhinitis.   Plan:  Treatment plan and additional workup :  MOCA should be performed if mild cognitive impaired progresses, the conversion to dementia is 7% per year.  PAD to be evaluated as a possible cause for PLMs. I will leave this to Dr Nehemiah SettlePolite.  Nasocort exchanged Nasonex, he has a 200 USD copay for this nasal spray.       Porfirio Mylararmen Laverle Pillard MD 08/10/2014

## 2014-08-25 ENCOUNTER — Encounter (HOSPITAL_COMMUNITY): Payer: Self-pay | Admitting: Internal Medicine

## 2014-10-03 ENCOUNTER — Encounter: Payer: Self-pay | Admitting: Internal Medicine

## 2014-10-03 ENCOUNTER — Ambulatory Visit (INDEPENDENT_AMBULATORY_CARE_PROVIDER_SITE_OTHER): Payer: Medicare Other | Admitting: *Deleted

## 2014-10-03 ENCOUNTER — Telehealth: Payer: Self-pay | Admitting: Cardiology

## 2014-10-03 DIAGNOSIS — I442 Atrioventricular block, complete: Secondary | ICD-10-CM

## 2014-10-03 LAB — MDC_IDC_ENUM_SESS_TYPE_REMOTE
Battery Impedance: 184 Ohm
Battery Remaining Longevity: 104 mo
Battery Voltage: 2.79 V
Brady Statistic AP VP Percent: 80 %
Brady Statistic AP VS Percent: 0 %
Brady Statistic AS VP Percent: 20 %
Brady Statistic AS VS Percent: 0 %
Date Time Interrogation Session: 20160118185457
Lead Channel Impedance Value: 394 Ohm
Lead Channel Impedance Value: 617 Ohm
Lead Channel Pacing Threshold Amplitude: 0.5 V
Lead Channel Pacing Threshold Amplitude: 0.75 V
Lead Channel Pacing Threshold Pulse Width: 0.4 ms
Lead Channel Pacing Threshold Pulse Width: 0.4 ms
Lead Channel Sensing Intrinsic Amplitude: 4 mV
Lead Channel Setting Pacing Amplitude: 2 V
Lead Channel Setting Pacing Amplitude: 2.5 V
Lead Channel Setting Pacing Pulse Width: 0.4 ms
Lead Channel Setting Sensing Sensitivity: 4 mV

## 2014-10-03 NOTE — Progress Notes (Signed)
Remote pacemaker transmission.   

## 2014-10-03 NOTE — Telephone Encounter (Signed)
Spoke with pt and reminded pt of remote transmission that is due today. Pt verbalized understanding.   

## 2014-10-07 ENCOUNTER — Encounter: Payer: Self-pay | Admitting: Internal Medicine

## 2014-10-09 ENCOUNTER — Encounter: Payer: Self-pay | Admitting: Cardiology

## 2014-10-09 DIAGNOSIS — I480 Paroxysmal atrial fibrillation: Secondary | ICD-10-CM

## 2014-10-09 HISTORY — DX: Paroxysmal atrial fibrillation: I48.0

## 2014-10-11 ENCOUNTER — Encounter: Payer: Self-pay | Admitting: Cardiology

## 2014-10-11 NOTE — Telephone Encounter (Signed)
This encounter was created in error - please disregard.

## 2014-10-11 NOTE — Telephone Encounter (Signed)
New Message      Patient needed to discuss a drug issue that was previously spoke of. Please call patient @ your earliest

## 2014-10-12 ENCOUNTER — Telehealth: Payer: Self-pay | Admitting: Cardiology

## 2014-10-12 ENCOUNTER — Telehealth: Payer: Self-pay | Admitting: *Deleted

## 2014-10-12 ENCOUNTER — Other Ambulatory Visit (INDEPENDENT_AMBULATORY_CARE_PROVIDER_SITE_OTHER): Payer: Medicare Other | Admitting: *Deleted

## 2014-10-12 DIAGNOSIS — I480 Paroxysmal atrial fibrillation: Secondary | ICD-10-CM

## 2014-10-12 DIAGNOSIS — I10 Essential (primary) hypertension: Secondary | ICD-10-CM

## 2014-10-12 LAB — BASIC METABOLIC PANEL
BUN: 34 mg/dL — ABNORMAL HIGH (ref 6–23)
CO2: 28 mEq/L (ref 19–32)
Calcium: 9.1 mg/dL (ref 8.4–10.5)
Chloride: 107 mEq/L (ref 96–112)
Creatinine, Ser: 1.89 mg/dL — ABNORMAL HIGH (ref 0.40–1.50)
GFR: 36.58 mL/min — ABNORMAL LOW (ref >60.00)
Glucose, Bld: 90 mg/dL (ref 70–99)
Potassium: 4.2 mEq/L (ref 3.5–5.1)
Sodium: 141 mEq/L (ref 135–145)

## 2014-10-12 MED ORDER — APIXABAN 2.5 MG PO TABS
2.5000 mg | ORAL_TABLET | Freq: Two times a day (BID) | ORAL | Status: DC
Start: 2014-10-12 — End: 2015-05-06

## 2014-10-12 NOTE — Telephone Encounter (Signed)
-----   Message from Quintella Reichertraci R Turner, MD sent at 10/11/2014  3:29 PM EST ----- He will need to be referred to [facility] to change to coumadin unless insurance will pay for Xarelto

## 2014-10-12 NOTE — Telephone Encounter (Signed)
Notes Recorded by Quintella Reichertraci R Turner, MD on 10/09/2014 at 10:43 AM Patient has evidence of PAF on pacer check with 88 mode switches with longest episode >1 hour. His CHADS2VASC score is 5 (age>75, DM, HTN, vascular disease). His creatinine clearance is 33.386ml/min. Please have him start Apixiban 2.5mg  BID and stop ASA. Please have him come Monday for a BMET  Spoke with patient, who has decided to go forward with Apixaban. Instructed patient to START Apixaban 2.5 mg BID and to STOP ASA. Patient coming today for lab work. Patient agrees with treatment plan.

## 2014-10-12 NOTE — Telephone Encounter (Signed)
Patient called and stated that he had discussed starting eliquis with you and he would like to proceed. He stated that you can disregard the conversation about contacting his insurance company as he has already resolved this matter. Please advise as I do not see any documentation concerning starting eliquis. Thanks, MI

## 2014-10-13 ENCOUNTER — Other Ambulatory Visit: Payer: Self-pay

## 2014-10-13 DIAGNOSIS — R7989 Other specified abnormal findings of blood chemistry: Secondary | ICD-10-CM

## 2014-10-14 ENCOUNTER — Telehealth: Payer: Self-pay | Admitting: *Deleted

## 2014-10-14 MED ORDER — AMLODIPINE BESYLATE 5 MG PO TABS: 5.0000 mg | ORAL_TABLET | Freq: Every day | ORAL | Status: DC

## 2014-10-14 NOTE — Telephone Encounter (Signed)
Patient stated that he spoke with you yesterday about starting amlodipine. I do not see any documentation concerning this. He would like a call back to further discuss the medication. He can be reached at [phone]. Thanks, MI

## 2014-10-14 NOTE — Telephone Encounter (Signed)
Returned patient's call. Dr. Mayford Knifeurner wanted patient to start amlodipine 5 mg daily, due to his latest labs. Order has been sent to patient's pharmacy.

## 2014-10-16 NOTE — Telephone Encounter (Signed)
Please make sure he knows that he is supposed to stop the monopril

## 2014-10-17 NOTE — Telephone Encounter (Signed)
Per patient, he is not taking monopril. He has started taking amlodipine with no complaints.

## 2014-10-19 ENCOUNTER — Other Ambulatory Visit (INDEPENDENT_AMBULATORY_CARE_PROVIDER_SITE_OTHER): Payer: Medicare Other | Admitting: *Deleted

## 2014-10-19 DIAGNOSIS — R7989 Other specified abnormal findings of blood chemistry: Secondary | ICD-10-CM

## 2014-10-19 DIAGNOSIS — R748 Abnormal levels of other serum enzymes: Secondary | ICD-10-CM

## 2014-10-19 LAB — BASIC METABOLIC PANEL
BUN: 25 mg/dL — ABNORMAL HIGH (ref 6–23)
CO2: 30 mEq/L (ref 19–32)
Calcium: 9.6 mg/dL (ref 8.4–10.5)
Chloride: 108 mEq/L (ref 96–112)
Creatinine, Ser: 1.59 mg/dL — ABNORMAL HIGH (ref 0.40–1.50)
GFR: 44.66 mL/min — ABNORMAL LOW (ref >60.00)
Glucose, Bld: 62 mg/dL — ABNORMAL LOW (ref 70–99)
Potassium: 4.1 mEq/L (ref 3.5–5.1)
Sodium: 140 mEq/L (ref 135–145)

## 2014-10-20 ENCOUNTER — Telehealth: Payer: Self-pay | Admitting: *Deleted

## 2014-10-20 NOTE — Telephone Encounter (Signed)
pt notified of lab results and to f/u w/PCP due to glucose 62. Fax results to PCP today, pt said thank you and verbalized understanding.

## 2014-10-26 ENCOUNTER — Telehealth: Payer: Self-pay | Admitting: Cardiology

## 2014-10-26 NOTE — Telephone Encounter (Signed)
New message      Pt c/o medication issue:  1. Name of Medication: eliquis or amlodipine 2. How are you currently taking this medication (dosage and times per day)?  3. Are you having a reaction (difficulty breathing--STAT)? No  4. What is your medication issue? Pt says it is closing down his nasel passages causing him to breathe thru his mouth.  Pt started both drugs at the same time and is not sure which one is causing the problem.  He has not taken the medication today

## 2014-10-26 NOTE — Telephone Encounter (Signed)
Explained to patient that he needs to take his medications as instructed, as Eliquis and amlodipine do not cause congestion.  Instructed patient to take his Zyrtec as directed and to use his nasal spray daily instead of PRN until his congestion improves, because this is probably just due to changing temperatures and allergies lately.  Patient agrees with treatment plan.

## 2014-11-14 ENCOUNTER — Telehealth: Payer: Self-pay | Admitting: *Deleted

## 2014-11-14 NOTE — Telephone Encounter (Signed)
Called [employer] and PA approved through next year. Patient notified.

## 2014-11-14 NOTE — Telephone Encounter (Signed)
Patient called and stated that he is out of the eliquis and he is unable to get this from the pharmacy, but he was not sure why. I called [facility] and was informed that the medication requires a prior auth. The pharmacist stated that they have faxed the prior auth form multiple times and are unable to re-send it at this time. The number provided by the pharmacist to call is [phone] and the patients member id number is [id number]. Thanks, MI

## 2014-11-21 ENCOUNTER — Ambulatory Visit (INDEPENDENT_AMBULATORY_CARE_PROVIDER_SITE_OTHER): Payer: Medicare Other | Admitting: Internal Medicine

## 2014-11-21 ENCOUNTER — Encounter: Payer: Self-pay | Admitting: Internal Medicine

## 2014-11-21 ENCOUNTER — Other Ambulatory Visit: Payer: Self-pay

## 2014-11-21 VITALS — BP 146/72 | HR 50 | Ht 67.5 in | Wt 159.2 lb

## 2014-11-21 DIAGNOSIS — N183 Chronic kidney disease, stage 3 unspecified: Secondary | ICD-10-CM | POA: Insufficient documentation

## 2014-11-21 DIAGNOSIS — N2889 Other specified disorders of kidney and ureter: Secondary | ICD-10-CM | POA: Insufficient documentation

## 2014-11-21 DIAGNOSIS — I4891 Unspecified atrial fibrillation: Secondary | ICD-10-CM

## 2014-11-21 DIAGNOSIS — I10 Essential (primary) hypertension: Secondary | ICD-10-CM

## 2014-11-21 DIAGNOSIS — Z950 Presence of cardiac pacemaker: Secondary | ICD-10-CM

## 2014-11-21 DIAGNOSIS — I442 Atrioventricular block, complete: Secondary | ICD-10-CM

## 2014-11-21 DIAGNOSIS — N189 Chronic kidney disease, unspecified: Secondary | ICD-10-CM

## 2014-11-21 LAB — MDC_IDC_ENUM_SESS_TYPE_INCLINIC
Battery Impedance: 208 Ohm
Battery Remaining Longevity: 100 mo
Battery Voltage: 2.79 V
Brady Statistic AP VP Percent: 80 %
Brady Statistic AP VS Percent: 0 %
Brady Statistic AS VP Percent: 20 %
Brady Statistic AS VS Percent: 0 %
Date Time Interrogation Session: 20160307130936
Lead Channel Impedance Value: 398 Ohm
Lead Channel Impedance Value: 601 Ohm
Lead Channel Pacing Threshold Amplitude: 0.75 V
Lead Channel Pacing Threshold Amplitude: 1 V
Lead Channel Pacing Threshold Pulse Width: 0.4 ms
Lead Channel Pacing Threshold Pulse Width: 0.4 ms
Lead Channel Sensing Intrinsic Amplitude: 4 mV
Lead Channel Setting Pacing Amplitude: 2 V
Lead Channel Setting Pacing Amplitude: 2.5 V
Lead Channel Setting Pacing Pulse Width: 0.4 ms
Lead Channel Setting Sensing Sensitivity: 4 mV

## 2014-11-21 NOTE — Progress Notes (Signed)
Electrophysiology Office Note   Date:  11/21/2014   ID:  Edward Suarez, DOB 1932-04-09, MRN [id number]  PCP:  Katy ApoPOLITE,RONALD D, MD  Cardiologist:  Dr Mayford Knifeurner Primary Electrophysiologist: Hillis RangeJames Lamyah Creed, MD    Chief Complaint  Patient presents with  . Hypertension     History of Present Illness: Edward Suarez is a 81 y.o. male who presents today for electrophysiology evaluation.   He has done well recently.  He remains active.  He has been found to have afib on device interrogation for which he has been asymptomatic. He is tolerating eliquis without bleeding.  His BP is stable.   Today, he denies symptoms of palpitations, chest pain, shortness of breath, orthopnea, PND, lower extremity edema, claudication, dizziness, presyncope, syncope, bleeding, or neurologic sequela. The patient is tolerating medications without difficulties and is otherwise without complaint today.    Past Medical History  Diagnosis Date  . Complete heart block 01/23/04    s/p Medtronic PPM implanted by Dr Amil AmenEdmunds  . Hypertension   . Diabetes mellitus   . COPD (chronic obstructive pulmonary disease)   . Carotid artery occlusion   . Hyperlipidemia   . BPH (benign prostatic hyperplasia)   . Glaucoma   . Cataracts, bilateral   . Kidney stones   . Allergic rhinitis, cause unspecified   . CAD (coronary artery disease)   . Chronic kidney disease (CKD), stage III (moderate)   . Paroxysmal atrial fibrillation     detected on PPM interrogation  . 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 PAF (paroxysmal atrial fibrillation) 10/09/2014    Noted on pacer check with 88 mode switches and longest episode >1 hour.  Now on Apixiban for CHADS2VASC score of 5   Past Surgical History  Procedure Laterality Date  . Pacemaker insertion  2005, 04/16/12    MDT implanted by Dr Amil AmenEdmunds with generator chnage (MDT Adapta L) by Dr Johney FrameAllred 04/16/12  . Permanent pacemaker generator change N/A 04/16/2012    Procedure: PERMANENT  PACEMAKER GENERATOR CHANGE;  Surgeon: Hillis RangeJames Annaleise Burger, MD;  Location: [facility];  Service: Cardiovascular;  Laterality: N/A;     Current Outpatient Prescriptions  Medication Sig Dispense Refill  . amLODipine (NORVASC) 5 MG tablet Take 1 tablet (5 mg total) by mouth daily. 180 tablet 3  . apixaban (ELIQUIS) 2.5 MG TABS tablet Take 1 tablet (2.5 mg total) by mouth 2 (two) times daily. 60 tablet 6  . atorvastatin (LIPITOR) 40 MG tablet Take 40 mg by mouth every evening.     . cetirizine (ZYRTEC) 10 MG tablet Take 10 mg by mouth daily. For allergies    . fluticasone (FLONASE) 50 MCG/ACT nasal spray Place 2 sprays into both nostrils daily as needed for allergies or rhinitis.    [name] Kitchen. glipiZIDE (GLUCOTROL) 5 MG tablet Take 5 mg by mouth daily before breakfast.      . pioglitazone (ACTOS) 15 MG tablet Take 15 mg by mouth daily.       No current facility-administered medications for this visit.    Allergies:   Penicillins   Social History:  The patient  reports that he quit smoking about 16 years ago. His smoking use included Cigarettes. He has never used smokeless tobacco. He reports that he drinks about 3.0 oz of alcohol per week. He reports that he does not use illicit drugs.   Family History:  The patient's family history includes Heart attack in his father; Heart disease in his father.  ROS:  Please see the history of present illness.   All other systems are reviewed and negative.    PHYSICAL EXAM: VS:  BP 146/72 mmHg  Pulse 50  Ht 5' 7.5" (1.715 m)  Wt 159 lb 3.2 oz (72.213 kg)  BMI 24.55 kg/m2  SpO2 97% , BMI Body mass index is 24.55 kg/(m^2). GEN: Well nourished, well developed, in no acute distress HEENT: normal Neck: no JVD, carotid bruits, or masses Cardiac: RRR; no murmurs, rubs, or gallops,no edema  Respiratory:  clear to auscultation bilaterally, normal work of breathing GI: soft, nontender, nondistended, + BS MS: no deformity or atrophy Skin: warm and dry, device pocket  is well healed Neuro:  Strength and sensation are intact Psych: euthymic mood, full affect  Device interrogation is reviewed today in detail.  See PaceArt for details.  Recent Labs: 10/19/2014: BUN 25*; Creatinine 1.59*; Potassium 4.1; Sodium 140    Lipid Panel  No results found for: CHOL, TRIG, HDL, CHOLHDL, VLDL, LDLCALC, LDLDIRECT   Wt Readings from Last 3 Encounters:  11/21/14 159 lb 3.2 oz (72.213 kg)  08/10/14 160 lb (72.576 kg)  06/15/14 159 lb (72.122 kg)     ASSESSMENT AND PLAN:  1.  Complete heart block Normal pacemaker function See Pace Art report No changes today  2. Paroxysmal atrial fibrillaton Recently initiated on eliquis for chads2vasc score of 5 No changes today  3. HTN Stable No change required today  4. CRI Creatinine >1.5, appropriate eliquis dose is confirmed to be 2.5mg  bid  Current medicines are reviewed at length with the patient today.   The patient does not have concerns regarding his medicines.  The following changes were made today:  none  Labs/ tests ordered today include Orders Placed This Encounter  Procedures  . Implantable device check    Follow-up: carelink, return to see Gypsy Balsam NP in the EP device clinic in 1 year  Signed, Hillis Range, MD  11/21/2014 10:04 PM     [facility] [address] [place] [address] [phone] (office) [phone] (fax)

## 2014-11-21 NOTE — Patient Instructions (Signed)
Your physician wants you to follow-up in: 12 months with Amber Seiler, NP You will receive a reminder letter in the mail two months in advance. If you don't receive a letter, please call our office to schedule the follow-up appointment.   Remote monitoring is used to monitor your Pacemaker or ICD from home. This monitoring reduces the number of office visits required to check your device to one time per year. It allows us to keep an eye on the functioning of your device to ensure it is working properly. You are scheduled for a device check from home on 02/20/15. You may send your transmission at any time that day. If you have a wireless device, the transmission will be sent automatically. After your physician reviews your transmission, you will receive a postcard with your next transmission date.   

## 2014-12-12 ENCOUNTER — Telehealth: Payer: Self-pay | Admitting: Cardiology

## 2014-12-12 NOTE — Telephone Encounter (Signed)
LMOVM for pt to return call in regards to dentist appt.

## 2014-12-15 NOTE — Telephone Encounter (Signed)
error 

## 2015-01-13 ENCOUNTER — Telehealth: Payer: Self-pay | Admitting: Cardiology

## 2015-01-13 NOTE — Telephone Encounter (Signed)
Patient st earlier today he blew his nose and his nose started to bleed shortly after. He st his allergies have been awful and the nose bleed lasted for less than 5 minutes and stopped.  Instructed patient to take his medications, including Eliquis, as directed since the nose bleed was short-lived and is probably due to allergies. Instructed patient to call next time he has a nosebleed if it lasts longer or becomes more frequent (patient st he experiences a nosebleed every few months or so). Patient agrees with treatment plan.

## 2015-01-13 NOTE — Telephone Encounter (Signed)
New message        Pt has had a nose bleed and would like to go over his medicines with you

## 2015-02-09 ENCOUNTER — Encounter: Payer: Self-pay | Admitting: Cardiology

## 2015-02-20 ENCOUNTER — Ambulatory Visit (INDEPENDENT_AMBULATORY_CARE_PROVIDER_SITE_OTHER): Payer: Medicare Other | Admitting: *Deleted

## 2015-02-20 DIAGNOSIS — I4891 Unspecified atrial fibrillation: Secondary | ICD-10-CM | POA: Diagnosis not present

## 2015-02-21 NOTE — Progress Notes (Signed)
Remote pacemaker transmission.   

## 2015-02-28 LAB — CUP PACEART REMOTE DEVICE CHECK
Battery Impedance: 233 Ohm
Battery Remaining Longevity: 93 mo
Battery Voltage: 2.79 V
Brady Statistic AP VP Percent: 76 %
Brady Statistic AP VS Percent: 0 %
Brady Statistic AS VP Percent: 23 %
Brady Statistic AS VS Percent: 0 %
Date Time Interrogation Session: 20160606114532
Lead Channel Impedance Value: 409 Ohm
Lead Channel Impedance Value: 453 Ohm
Lead Channel Pacing Threshold Amplitude: 0.5 V
Lead Channel Pacing Threshold Amplitude: 0.875 V
Lead Channel Pacing Threshold Pulse Width: 0.4 ms
Lead Channel Pacing Threshold Pulse Width: 0.4 ms
Lead Channel Sensing Intrinsic Amplitude: 2.8 mV
Lead Channel Setting Pacing Amplitude: 2 V
Lead Channel Setting Pacing Amplitude: 2.5 V
Lead Channel Setting Pacing Pulse Width: 0.4 ms
Lead Channel Setting Sensing Sensitivity: 4 mV

## 2015-03-06 ENCOUNTER — Encounter: Payer: Self-pay | Admitting: Cardiology

## 2015-03-10 ENCOUNTER — Encounter: Payer: Self-pay | Admitting: Internal Medicine

## 2015-05-06 ENCOUNTER — Other Ambulatory Visit: Payer: Self-pay | Admitting: Cardiology

## 2015-05-08 NOTE — Progress Notes (Signed)
Cardiology Office Note   Date:  05/09/2015   ID:  Edward Suarez, DOB 15-Nov-1931, MRN [id number]  PCP:  Katy Apo, MD    Chief Complaint  Patient presents with  . Arioventricular block, complete      History of Present Illness: Edward Suarez is a 81 y.o. male with a history of HTN, DM, dyslipidemia, Complete HB s/p PPM and PVC's who presents today for followup. He is doing well. He denies any chest pain, SOB, DOE, LE edema, dizziness, palpitations or syncope.    Past Medical History  Diagnosis Date  . Complete heart block 01/23/04    s/p Medtronic PPM implanted by Dr Amil Amen  . Hypertension   . Diabetes mellitus   . COPD (chronic obstructive pulmonary disease)   . Carotid artery occlusion   . Hyperlipidemia   . BPH (benign prostatic hyperplasia)   . Glaucoma   . Cataracts, bilateral   . Kidney stones   . Allergic rhinitis, cause unspecified   . CAD (coronary artery disease)   . Chronic kidney disease (CKD), stage III (moderate)   . Paroxysmal atrial fibrillation     detected on PPM interrogation  . 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 PAF (paroxysmal atrial fibrillation) 10/09/2014    Noted on pacer check with 88 mode switches and longest episode >1 hour.  Now on Apixiban for CHADS2VASC score of 5    Past Surgical History  Procedure Laterality Date  . Pacemaker insertion  2005, 04/16/12    MDT implanted by Dr Amil Amen with generator chnage (MDT Adapta L) by Dr Johney Frame 04/16/12  . Permanent pacemaker generator change N/A 04/16/2012    Procedure: PERMANENT PACEMAKER GENERATOR CHANGE;  Surgeon: Hillis Range, MD;  [facility] CATH LAB;  Service: Cardiovascular;  Laterality: N/A;     Current Outpatient Prescriptions  Medication Sig Dispense Refill  . amLODipine (NORVASC) 5 MG tablet Take 1 tablet (5 mg total) by mouth daily. 180 tablet 3  . atorvastatin (LIPITOR) 40 MG tablet Take 40 mg by mouth every evening.     . cetirizine (ZYRTEC)  10 MG tablet Take 10 mg by mouth daily. For allergies    . ELIQUIS 2.5 MG TABS tablet TAKE 1 TABLET (2.5 MG TOTAL) BY MOUTH 2 (TWO) TIMES DAILY. 60 tablet 6  . fluticasone (FLONASE) 50 MCG/ACT nasal spray Place 2 sprays into both nostrils daily as needed for allergies or rhinitis.    [name] Kitchen glipiZIDE (GLUCOTROL) 5 MG tablet Take 5 mg by mouth daily before breakfast.      . pioglitazone (ACTOS) 15 MG tablet Take 15 mg by mouth daily.       No current facility-administered medications for this visit.    Allergies:   Penicillins    Social History:  The patient  reports that he quit smoking about 16 years ago. His smoking use included Cigarettes. He has never used smokeless tobacco. He reports that he drinks about 3.0 oz of alcohol per week. He reports that he does not use illicit drugs.   Family History:  The patient's family history includes Heart attack in his father; Heart disease in his father.    ROS:  Please see the history of present illness.   Otherwise, review of systems are positive for none.   All other systems are reviewed and negative.    PHYSICAL EXAM: VS:  BP 150/78 mmHg  Pulse  86  Ht 5' 7.5" (1.715 m)  Wt 162 lb 12.8 oz (73.846 kg)  BMI 25.11 kg/m2 , BMI Body mass index is 25.11 kg/(m^2). GEN: Well nourished, well developed, in no acute distress HEENT: normal Neck: no JVD, carotid bruits, or masses Cardiac: RRR; no murmurs, rubs, or gallops,no edema  Respiratory:  clear to auscultation bilaterally, normal work of breathing GI: soft, nontender, nondistended, + BS MS: no deformity or atrophy Skin: warm and dry, no rash Neuro:  Strength and sensation are intact Psych: euthymic mood, full affect   EKG:  EKG was ordered today and showed AV paced rhythm    Recent Labs: 10/19/2014: BUN 25*; Creatinine, Ser 1.59*; Potassium 4.1; Sodium 140    Lipid Panel No results found for: CHOL, TRIG, HDL, CHOLHDL, VLDL, LDLCALC, LDLDIRECT    Wt Readings from Last 3 Encounters:    05/09/15 162 lb 12.8 oz (73.846 kg)  11/21/14 159 lb 3.2 oz (72.213 kg)  08/10/14 160 lb (72.576 kg)    ASSESSMENT AND PLAN:  1. Complete heart block s/p PPM followed in pacer clinic 2. HTN borderline controlled.  At home his BP runs 130/70's. - continue amlodipine 3. PVC's asymptomatic 4. Mild LV dysfunction EF 45-50% with diastolic dysfunction and abnormal septal wall motion due to paced rhythm 5. PAF(noted on pacer check) maintaining AV paced rhythm. Continue Eliquis for CHADS2VASC score of 5 6. CRI Creat >1.5 and age >32 so dosed Eliquis at 2.5mg  BID.  Check BMET    Current medicines are reviewed at length with the patient today.  The patient does not have concerns regarding medicines.  The following changes have been made:  no change  Labs/ tests ordered today: See above Assessment and Plan No orders of the defined types were placed in this encounter.     Disposition:   FU with me in 1 year  Signed, Quintella Reichert, MD  05/09/2015 10:54 AM    [facility] [address] [place], [place], [place]  [address] Phone: [phone]; Fax: [phone]

## 2015-05-09 ENCOUNTER — Ambulatory Visit (INDEPENDENT_AMBULATORY_CARE_PROVIDER_SITE_OTHER): Payer: Medicare Other | Admitting: Cardiology

## 2015-05-09 ENCOUNTER — Encounter: Payer: Self-pay | Admitting: Cardiology

## 2015-05-09 VITALS — BP 150/78 | HR 86 | Ht 67.5 in | Wt 162.8 lb

## 2015-05-09 DIAGNOSIS — I480 Paroxysmal atrial fibrillation: Secondary | ICD-10-CM | POA: Diagnosis not present

## 2015-05-09 DIAGNOSIS — I442 Atrioventricular block, complete: Secondary | ICD-10-CM | POA: Diagnosis not present

## 2015-05-09 DIAGNOSIS — I10 Essential (primary) hypertension: Secondary | ICD-10-CM | POA: Diagnosis not present

## 2015-05-09 DIAGNOSIS — I493 Ventricular premature depolarization: Secondary | ICD-10-CM

## 2015-05-09 LAB — BASIC METABOLIC PANEL
BUN: 21 mg/dL (ref 6–23)
CO2: 29 mEq/L (ref 19–32)
Calcium: 9.5 mg/dL (ref 8.4–10.5)
Chloride: 106 mEq/L (ref 96–112)
Creatinine, Ser: 1.44 mg/dL (ref 0.40–1.50)
GFR: 50 mL/min — ABNORMAL LOW (ref >60.00)
Glucose, Bld: 123 mg/dL — ABNORMAL HIGH (ref 70–99)
Potassium: 4.3 mEq/L (ref 3.5–5.1)
Sodium: 142 mEq/L (ref 135–145)

## 2015-05-09 NOTE — Patient Instructions (Signed)
Medication Instructions:  Your physician recommends that you continue on your current medications as directed. Please refer to the Current Medication list given to you today.   Labwork: TODAY: BMET  Testing/Procedures: None  Follow-Up: Your physician wants you to follow-up in: 1 year with Dr. Turner. You will receive a reminder letter in the mail two months in advance. If you don't receive a letter, please call our office to schedule the follow-up appointment.   Any Other Special Instructions Will Be Listed Below (If Applicable).   

## 2015-05-11 ENCOUNTER — Telehealth: Payer: Self-pay

## 2015-05-11 NOTE — Telephone Encounter (Signed)
Refill for Eliquis came in from [facility] but was already sent to local pharmacy and pt stated that was correct. He didnt want it to go to [facility]. He stated he doesnt need a refill of his Eliquis now anyway.

## 2015-05-23 ENCOUNTER — Ambulatory Visit (INDEPENDENT_AMBULATORY_CARE_PROVIDER_SITE_OTHER): Payer: Medicare Other | Admitting: *Deleted

## 2015-05-23 DIAGNOSIS — I442 Atrioventricular block, complete: Secondary | ICD-10-CM | POA: Diagnosis not present

## 2015-05-23 NOTE — Progress Notes (Signed)
Remote pacemaker transmission.   

## 2015-05-30 LAB — CUP PACEART REMOTE DEVICE CHECK
Brady Statistic AP VP Percent: 81.9 %
Brady Statistic AP VS Percent: 0.2 %
Brady Statistic AS VP Percent: 17.8 %
Brady Statistic AS VS Percent: 0.1 %
Date Time Interrogation Session: 20160913113513
Lead Channel Impedance Value: 399 Ohm
Lead Channel Impedance Value: 548 Ohm
Lead Channel Pacing Threshold Amplitude: 0.5 V
Lead Channel Pacing Threshold Amplitude: 0.875 V
Lead Channel Pacing Threshold Pulse Width: 0.4 ms
Lead Channel Pacing Threshold Pulse Width: 0.4 ms
Lead Channel Setting Pacing Amplitude: 2 V
Lead Channel Setting Pacing Amplitude: 2.5 V
Lead Channel Setting Pacing Pulse Width: 0.4 ms
Lead Channel Setting Sensing Sensitivity: 4 mV

## 2015-06-05 ENCOUNTER — Encounter: Payer: Self-pay | Admitting: *Deleted

## 2015-06-06 ENCOUNTER — Encounter: Payer: Self-pay | Admitting: Cardiology

## 2015-06-27 ENCOUNTER — Encounter: Payer: Self-pay | Admitting: Internal Medicine

## 2015-08-22 ENCOUNTER — Ambulatory Visit (INDEPENDENT_AMBULATORY_CARE_PROVIDER_SITE_OTHER): Payer: Medicare Other | Admitting: *Deleted

## 2015-08-22 DIAGNOSIS — I442 Atrioventricular block, complete: Secondary | ICD-10-CM | POA: Diagnosis not present

## 2015-08-23 ENCOUNTER — Encounter: Payer: Self-pay | Admitting: Cardiology

## 2015-08-25 NOTE — Progress Notes (Signed)
Remote pacemaker transmission.   

## 2015-09-01 LAB — CUP PACEART REMOTE DEVICE CHECK
Battery Impedance: 257 Ohm
Battery Remaining Longevity: 93 mo
Battery Voltage: 2.79 V
Brady Statistic AP VP Percent: 84 %
Brady Statistic AP VS Percent: 0 %
Brady Statistic AS VP Percent: 15 %
Brady Statistic AS VS Percent: 0 %
Date Time Interrogation Session: 20161206153314
Implantable Lead Implant Date: 20050509
Implantable Lead Implant Date: 20050509
Implantable Lead Location: 753859
Implantable Lead Location: 753860
Implantable Lead Model: 5076
Implantable Lead Model: 5092
Lead Channel Impedance Value: 405 Ohm
Lead Channel Impedance Value: 597 Ohm
Lead Channel Setting Pacing Amplitude: 2 V
Lead Channel Setting Pacing Amplitude: 2.5 V
Lead Channel Setting Pacing Pulse Width: 0.4 ms
Lead Channel Setting Sensing Sensitivity: 4 mV

## 2015-09-06 ENCOUNTER — Encounter: Payer: Self-pay | Admitting: Cardiology

## 2015-09-29 DIAGNOSIS — Z1389 Encounter for screening for other disorder: Secondary | ICD-10-CM | POA: Diagnosis not present

## 2015-09-29 DIAGNOSIS — J309 Allergic rhinitis, unspecified: Secondary | ICD-10-CM | POA: Diagnosis not present

## 2015-09-29 DIAGNOSIS — F09 Unspecified mental disorder due to known physiological condition: Secondary | ICD-10-CM | POA: Diagnosis not present

## 2015-09-29 DIAGNOSIS — E782 Mixed hyperlipidemia: Secondary | ICD-10-CM | POA: Diagnosis not present

## 2015-09-29 DIAGNOSIS — E1122 Type 2 diabetes mellitus with diabetic chronic kidney disease: Secondary | ICD-10-CM | POA: Diagnosis not present

## 2015-09-29 DIAGNOSIS — I10 Essential (primary) hypertension: Secondary | ICD-10-CM | POA: Diagnosis not present

## 2015-09-29 DIAGNOSIS — I442 Atrioventricular block, complete: Secondary | ICD-10-CM | POA: Diagnosis not present

## 2015-09-29 DIAGNOSIS — Z7984 Long term (current) use of oral hypoglycemic drugs: Secondary | ICD-10-CM | POA: Diagnosis not present

## 2015-09-29 DIAGNOSIS — N183 Chronic kidney disease, stage 3 (moderate): Secondary | ICD-10-CM | POA: Diagnosis not present

## 2015-09-29 DIAGNOSIS — R946 Abnormal results of thyroid function studies: Secondary | ICD-10-CM | POA: Diagnosis not present

## 2015-09-29 DIAGNOSIS — E030 Congenital hypothyroidism with diffuse goiter: Secondary | ICD-10-CM | POA: Diagnosis not present

## 2015-09-29 DIAGNOSIS — Z0000 Encounter for general adult medical examination without abnormal findings: Secondary | ICD-10-CM | POA: Diagnosis not present

## 2015-10-19 ENCOUNTER — Ambulatory Visit (INDEPENDENT_AMBULATORY_CARE_PROVIDER_SITE_OTHER): Payer: PPO | Admitting: Neurology

## 2015-10-19 ENCOUNTER — Encounter: Payer: Self-pay | Admitting: Neurology

## 2015-10-19 VITALS — BP 148/78 | HR 84 | Resp 20 | Ht 68.0 in | Wt 161.0 lb

## 2015-10-19 DIAGNOSIS — Z950 Presence of cardiac pacemaker: Secondary | ICD-10-CM | POA: Diagnosis not present

## 2015-10-19 DIAGNOSIS — M542 Cervicalgia: Secondary | ICD-10-CM

## 2015-10-19 DIAGNOSIS — G3184 Mild cognitive impairment, so stated: Secondary | ICD-10-CM | POA: Diagnosis not present

## 2015-10-19 DIAGNOSIS — F0390 Unspecified dementia without behavioral disturbance: Secondary | ICD-10-CM | POA: Insufficient documentation

## 2015-10-19 MED ORDER — CYCLOBENZAPRINE HCL 10 MG PO TABS: 10.0000 mg | ORAL_TABLET | Freq: Every day | ORAL | Status: DC

## 2015-10-19 NOTE — Progress Notes (Signed)
Provider:  Melvyn Novas, M D  Referring Provider: Renford Dills, MD Primary Care Physician:  Katy Apo, MD  Chief Complaint  Patient presents with  . Follow-up    insomnia, memory, rm 11, with wife    HPI:  Edward Suarez is a 82 y.o. male, who is seen here as a referral from Dr. Nehemiah Settle for Memory loss.   Edward Suarez is seen here today as an inpatient referred by Dr. Nehemiah Settle, he has a past medical history of allergies (runny nose) and has diabetes and CKD 3 . He had a pacemaker implanted for heart block  in 2005 and a redo in 2013. [name] Voiced some concern for memory difficulties and a Mini-Mental Status exam was performed on 05-04-14 and Dr. Lynnette Caffey' office. He scored 25 of 30 points.  Multiple metabolic labs were ordered and the patient  had a slightly elevated HbA1c at 6.7 -significant factor for memory loss.  He does have chronic kidney disease is in relation to his diabetes, which Dr. Nehemiah Settle graded 3. And he has hyperlipidemia, albeit well controlled. This creatinine level was 1.76. Normal sodium , normal potassium,  cholesterol total was 163 - is well controlled. Vitamin B12  and folic acid were normal. The patient reports his memory loss as a form of absent mindedness. He is a former smoker, quit 15 years ago after 40 plus pack years.   Interval history :  Discuss the results of the recent sleep study dated from 07-20-14. First of all his weight was noted at 200 pounds and is truly 158. Mouth index is a normal range. I would have to correct this on the matrix. His sleep study showed a very poor sleep efficiency meaning that he only slept for 44% of the recorded time. His AHI was 3.3 the RDI was 3.9 in supine sleep position his AHI rose to 5.4 which is still considered normal. Did not have REM sleep, oxygen nadir was 83% with a total time of desaturation of only 11.2 minutes. His sleep was heavily fragmented however and it was clearly that this was in relation to periodic  limb movements the patient had 105 periodic limb movements per hour of sleep and the arousal index was 18.8. The patient has no clinical history of restless leg syndrome so the conclusion is that he either has a spinal stenosis or impinged nerve or circulatory problem involving his lower extremities. He has no history of lower back pain or trouble but he reports cold feet and soles the circulatory component is most likely to be evaluated. Peripheral vascular disease has to be further evaluated, and he is followed by Carolanne Grumbling, MD, his cardiologist.   interval history; 10-19-2015, [name] is here today for a revisit ,   not following up on his sleep but on his cognitive function. He performed today a Montral  Cognitive Assessment at 26 out of 30 points which is excellent and normal result.in August 2015 Dr. Idelle Crouch office, the [facility]. He would not require medication. Only his hearing remains difficuttl  No sleep issues, no PLMs that he is aware of. He reports right neck pain, a tension component is evident.   Montreal Cognitive Assessment  10/19/2015  Visuospatial/ Executive (0/5) 5  Naming (0/3) 3  Attention: Read list of digits (0/2) 2  Attention: Read list of letters (0/1) 0  Attention: Serial 7 subtraction starting at 100 (0/3) 3  Language: Repeat phrase (0/2) 2  Language : Fluency (  0/1) 1  Abstraction (0/2) 2  Delayed Recall (0/5) 2  Orientation (0/6) 6  Total 26  Adjusted Score (based on education) 26      Review of Systems: Out of a complete 14 system review, the patient complains of only the following symptoms, and all other reviewed systems are negative. Short term memory loss.  Tension headaches. cervical and shoulder, has a pacemaker , not an MRI candidate.    Social History   Social History  . Marital Status: Married    Spouse Name: N/A  . Number of Children: 2  . Years of Education: BS   Occupational History  . Not on file.   Social History Main  Topics  . Smoking status: Former Smoker    Types: Cigarettes    Quit date: 09/16/1998  . Smokeless tobacco: Never Used  . Alcohol Use: 3.0 oz/week    2 Glasses of wine, 3 Shots of liquor per week     Comment: drinks a tequila shot daily  . Drug Use: No  . Sexual Activity: Not on file   Other Topics Concern  . 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   Patient is married with 2 children.   Patient is right handed.   Patient has BS degree.   Patient drinks 1 cup daily.          Family History  Problem Relation Age of Onset  . Heart disease Father   . Heart attack Father     Past Medical History  Diagnosis Date  . Complete heart block (HCC) 01/23/04    s/p Medtronic PPM implanted by Dr Amil Amen  . Hypertension   . Diabetes mellitus   . COPD (chronic obstructive pulmonary disease) (HCC)   . Carotid artery occlusion   . Hyperlipidemia   . BPH (benign prostatic hyperplasia)   . Glaucoma   . Cataracts, bilateral   . Kidney stones   . Allergic rhinitis, cause unspecified   . CAD (coronary artery disease)   . Chronic kidney disease (CKD), stage III (moderate)   . Paroxysmal atrial fibrillation (HCC)     detected on PPM interrogation  . 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 PAF (paroxysmal atrial fibrillation) (HCC) 10/09/2014    Noted on pacer check with 88 mode switches and longest episode >1 hour.  Now on Apixiban for CHADS2VASC score of 5    Past Surgical History  Procedure Laterality Date  . Pacemaker insertion  2005, 04/16/12    MDT implanted by Dr Amil Amen with generator chnage (MDT Adapta L) by Dr Johney Frame 04/16/12  . Permanent pacemaker generator change N/A 04/16/2012    Procedure: PERMANENT PACEMAKER GENERATOR CHANGE;  Surgeon: Hillis Range, MD;  Location: [facility] CATH LAB;  Service: Cardiovascular;  Laterality: N/A;    Current Outpatient Prescriptions  Medication Sig Dispense Refill  . amLODipine (NORVASC) 5 MG  tablet Take 1 tablet (5 mg total) by mouth daily. 180 tablet 3  . atorvastatin (LIPITOR) 40 MG tablet Take 40 mg by mouth every evening.     . cetirizine (ZYRTEC) 10 MG tablet Take 10 mg by mouth daily. For allergies    . ELIQUIS 2.5 MG TABS tablet TAKE 1 TABLET (2.5 MG TOTAL) BY MOUTH 2 (TWO) TIMES DAILY. 60 tablet 6  . fluticasone (FLONASE) 50 MCG/ACT nasal spray Place 2 sprays into both nostrils daily as needed for allergies or rhinitis.    [name] Kitchen glipiZIDE (  GLUCOTROL) 5 MG tablet Take 5 mg by mouth daily before breakfast.      . pioglitazone (ACTOS) 15 MG tablet Take 15 mg by mouth daily.       No current facility-administered medications for this visit.    Allergies as of 10/19/2015 - Review Complete 10/19/2015  Allergen Reaction Noted  . Penicillins Rash 08/10/2011    Vitals: BP 148/78 mmHg  Pulse 84  Resp 20  Ht  (1.727 m)  Wt 161 lb (73.029 kg)  BMI 24.49 kg/m2 Last Weight:  Wt Readings from Last 1 Encounters:  10/19/15 161 lb (73.029 kg)   Last Height:   Ht Readings from Last 1 Encounters:  10/19/15  (1.727 m)    Physical exam:  General: The patient is awake, alert and appears not in acute distress. The patient is well groomed. Head: Normocephalic, atraumatic. Neck is supple. Mallampati  is 4/ 5  No visible uvula and large tongue, retrognathia.  -16.75 , neck circumference.  Chronic nasal congestion.  Cardiovascular:  Regular rate and rhythm , without  murmurs or carotid bruit, and without distended neck veins. Respiratory: Lungs are clear to auscultation. Skin:  Without evidence of edema, or rash Trunk: patient has normal posture.  Neurologic exam : The patient is awake and alert, oriented to place and time.   Memory subjective described as intact.  There is a normal attention span & concentration ability. Speech is fluent without dysarthria,  dysphonia , not  aphasia.  Mood and affect are appropriate.  Cranial nerves: Pupils are equal and briskly  reactive to light. Funduscopic exam without  evidence of pallor or edema. Extraocular movements  in vertical and horizontal planes intact and without nystagmus.  Visual fields by finger perimetry are intact. Hearing to finger rub intact.  Facial sensation intact to fine touch. Facial motor strength- left eye droop, ptosis.  Lower face  symmetric and tongue  move midline. He had shingles in the face.  Tongue protrusion into either cheek is normal. Shoulder shrug is normal.  Tension pain over the left neck and shoulder, paraspinal tension.    Assessment:  After physical and neurologic examination, review of laboratory studies, imaging, neurophysiology testing and pre-existing records, assessment is that of :  This patient has if at all a mild cognitive impairment, MOCA 26-30 , all points lost in 5 word recall. Has had same result in the last visit -   PLM disorder, no apnea. Diabetic neuropathy is evident. He has cold feet, PVD is likely . Muscle tension pain. - try tizanidine or cyclobenzaprine.  Plan:  Treatment plan and additional workup :  MOCA should be performed if mild cognitive impaired progresses, the conversion to dementia is 7% per year.  Once a year rv.  PAD to be evaluated as a possible cause for PLMs. I will leave this to Dr Nehemiah Settle.  Gave a trial of Flexaril.        Porfirio Mylar Exzavier Ruderman MD 10/19/2015

## 2015-10-25 ENCOUNTER — Telehealth: Payer: Self-pay

## 2015-10-25 NOTE — Telephone Encounter (Signed)
Spoke to pt. I advised him that I was working on the pa for flexeril. Pt reports that he got a temporary supply of flexeril but doesn't want to take it long term so the pa is not needed.

## 2015-11-03 DIAGNOSIS — R946 Abnormal results of thyroid function studies: Secondary | ICD-10-CM | POA: Diagnosis not present

## 2015-11-07 ENCOUNTER — Other Ambulatory Visit (HOSPITAL_COMMUNITY): Payer: Self-pay | Admitting: Internal Medicine

## 2015-11-07 DIAGNOSIS — E0590 Thyrotoxicosis, unspecified without thyrotoxic crisis or storm: Secondary | ICD-10-CM

## 2015-11-07 DIAGNOSIS — R399 Unspecified symptoms and signs involving the genitourinary system: Secondary | ICD-10-CM | POA: Diagnosis not present

## 2015-11-21 ENCOUNTER — Encounter (HOSPITAL_COMMUNITY)
Admission: RE | Admit: 2015-11-21 | Discharge: 2015-11-21 | Disposition: A | Discharge status: Home / Self Care | Payer: PPO | Source: Ambulatory Visit | Attending: Internal Medicine | Admitting: Internal Medicine

## 2015-11-21 DIAGNOSIS — R938 Abnormal findings on diagnostic imaging of other specified body structures: Secondary | ICD-10-CM | POA: Insufficient documentation

## 2015-11-21 DIAGNOSIS — E042 Nontoxic multinodular goiter: Secondary | ICD-10-CM | POA: Insufficient documentation

## 2015-11-21 DIAGNOSIS — E0590 Thyrotoxicosis, unspecified without thyrotoxic crisis or storm: Secondary | ICD-10-CM | POA: Insufficient documentation

## 2015-11-21 MED ORDER — SODIUM IODIDE I 131 CAPSULE
12.7200 | Freq: Once | INTRAVENOUS | Status: AC | PRN
  Administered 2015-11-21: 12.72 via ORAL

## 2015-11-22 ENCOUNTER — Encounter (HOSPITAL_COMMUNITY)
Admission: RE | Admit: 2015-11-22 | Discharge: 2015-11-22 | Disposition: A | Discharge status: Home / Self Care | Payer: PPO | Source: Ambulatory Visit | Attending: Internal Medicine | Admitting: Internal Medicine

## 2015-11-22 DIAGNOSIS — E042 Nontoxic multinodular goiter: Secondary | ICD-10-CM | POA: Diagnosis not present

## 2015-11-22 DIAGNOSIS — R938 Abnormal findings on diagnostic imaging of other specified body structures: Secondary | ICD-10-CM | POA: Diagnosis not present

## 2015-11-22 DIAGNOSIS — E0590 Thyrotoxicosis, unspecified without thyrotoxic crisis or storm: Secondary | ICD-10-CM | POA: Diagnosis not present

## 2015-11-24 ENCOUNTER — Other Ambulatory Visit: Payer: Self-pay | Admitting: Cardiology

## 2015-11-29 NOTE — Progress Notes (Signed)
Electrophysiology Office Note Date: 11/30/2015  ID:  Edward Suarez, DOB [birth year], MRN [id number]  PCP: Katy ApoPOLITE,RONALD D, MD Primary Cardiologist: Mayford Knifeurner Electrophysiologist: Allred  CC: Pacemaker follow-up  Edward RoyalSheldon I Suarez is a [age] y.o. male seen today for Dr Johney FrameAllred.  He presents today for routine electrophysiology followup.  Since last being seen in our clinic, the patient reports doing very well. [facility]  He denies chest pain, palpitations, dyspnea, PND, orthopnea, nausea, vomiting, dizziness, syncope, edema, weight gain, or early satiety.  Device History: MDT dual chamber PPM implanted 2005 for complete heart block, gen change 2013   Past Medical History  Diagnosis Date  . Complete heart block (HCC) 01/23/04    s/p Medtronic PPM implanted by Dr Amil AmenEdmunds  . Hypertension   . Diabetes mellitus   . COPD (chronic obstructive pulmonary disease) (HCC)   . Carotid artery occlusion   . Hyperlipidemia   . BPH (benign prostatic hyperplasia)   . Glaucoma   . Cataracts, bilateral   . Kidney stones   . Allergic rhinitis, cause unspecified   . CAD (coronary artery disease)   . Chronic kidney disease (CKD), stage III (moderate)   . Paroxysmal atrial fibrillation (HCC)     detected on PPM interrogation  . 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 PAF (paroxysmal atrial fibrillation) (HCC) 10/09/2014    Noted on pacer check with 88 mode switches and longest episode >1 hour.  Now on Apixiban for CHADS2VASC score of 5   Past Surgical History  Procedure Laterality Date  . Pacemaker insertion  2005, 04/16/12    MDT implanted by Dr Amil AmenEdmunds with generator chnage (MDT Adapta L) by Dr Johney FrameAllred 04/16/12  . Permanent pacemaker generator change N/A 04/16/2012    Procedure: PERMANENT PACEMAKER GENERATOR CHANGE;  Surgeon: Hillis RangeJames Allred, MD;  [facility];  Service: Cardiovascular;  Laterality: N/A;    Current Outpatient Prescriptions  Medication  Sig Dispense Refill  . amLODipine (NORVASC) 5 MG tablet TAKE 1 TABLET (5 MG TOTAL) BY MOUTH DAILY. 180 tablet 1  . atorvastatin (LIPITOR) 40 MG tablet Take 40 mg by mouth every evening.     . cetirizine (ZYRTEC) 10 MG tablet Take 10 mg by mouth daily. For allergies    . ELIQUIS 2.5 MG TABS tablet TAKE 1 TABLET (2.5 MG TOTAL) BY MOUTH 2 (TWO) TIMES DAILY. 60 tablet 5  . fluticasone (FLONASE) 50 MCG/ACT nasal spray Place 2 sprays into both nostrils daily as needed for allergies or rhinitis.    [name] Kitchen. glipiZIDE (GLUCOTROL) 5 MG tablet Take 5 mg by mouth daily before breakfast.      . pioglitazone (ACTOS) 45 MG tablet Take 45 mg by mouth daily.      No current facility-administered medications for this visit.    Allergies:   Penicillins   Social History: Social History   Social History  . Marital Status: Married    Spouse Name: N/A  . Number of Children: 2  . Years of Education: BS   Occupational History  . Not on file.   Social History Main Topics  . Smoking status: Former Smoker    Types: Cigarettes    Quit date: 09/16/1998  . Smokeless tobacco: Never Used  . Alcohol Use: 3.0 oz/week    2 Glasses of wine, 3 Shots of liquor per week     Comment: drinks a tequila shot daily  . Drug Use: No  . Sexual  Activity: Not on file   Other Topics Concern  . 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   Patient is married with 2 children.   Patient is right handed.   Patient has BS degree.   Patient drinks 1 cup daily.          Family History: Family History  Problem Relation Age of Onset  . Heart disease Father   . Heart attack Father      Review of Systems: All other systems reviewed and are otherwise negative except as noted above.   Physical Exam: VS:  BP 150/70 mmHg  Pulse 88  Ht  (1.727 m)  Wt 163 lb 3.2 oz (74.027 kg)  BMI 24.82 kg/m2 , BMI Body mass index is 24.82 kg/(m^2).  GEN- The patient is elderly  appearing, alert and oriented x 3 today.   HEENT: normocephalic, atraumatic; sclera clear, conjunctiva pink; hearing intact; oropharynx clear; neck supple Lungs- Clear to ausculation bilaterally, normal work of breathing.  No wheezes, rales, rhonchi Heart- Regular rate and rhythm (paced) GI- soft, non-tender, non-distended, bowel sounds present Extremities- no clubbing, cyanosis, or edema; DP/PT/radial pulses 2+ bilaterally MS- no significant deformity or atrophy Skin- warm and dry, no rash or lesion; PPM pocket well healed Psych- euthymic mood, full affect Neuro- strength and sensation are intact  PPM Interrogation- reviewed in detail today,  See PACEART report  EKG:  EKG is not ordered today.  Recent Labs: 05/09/2015: BUN 21; Creatinine, Ser 1.44; Potassium 4.3; Sodium 142   Wt Readings from Last 3 Encounters:  11/30/15 163 lb 3.2 oz (74.027 kg)  10/19/15 161 lb (73.029 kg)  05/09/15 162 lb 12.8 oz (73.846 kg)     Other studies Reviewed: Additional studies/ records that were reviewed today include: Dr Johney Frame and Dr Norris Cross office notes  Assessment and Plan:  1.  Complete heart block  Normal PPM function - pt is device dependent today  See Pace Art report No changes today  2.  Paroxysmal atrial fibrillation Burden by device interrogation today 0.2% Continue Eliquis for CHADS2VASC of 6 (2.5mg  2/2 age and creatinine)  3.  NSVT Asymptomatic  No changes today  4.  HTN Stable No change required today   Current medicines are reviewed at length with the patient today.   The patient does not have concerns regarding his medicines.  The following changes were made today:  none  Labs/ tests ordered today include: none   Disposition:   Follow up with [facility] transmissions, Dr Johney Frame 1 year, Dr Mayford Knife as scheduled     Signed, Gypsy Balsam, NP 11/30/2015 1:15 PM  [facility] [address] [place] [address] [phone]  (office) [phone] (fax)

## 2015-11-30 ENCOUNTER — Encounter: Payer: Self-pay | Admitting: Nurse Practitioner

## 2015-11-30 ENCOUNTER — Ambulatory Visit (INDEPENDENT_AMBULATORY_CARE_PROVIDER_SITE_OTHER): Payer: PPO | Admitting: Nurse Practitioner

## 2015-11-30 VITALS — BP 150/70 | HR 88 | Ht 68.0 in | Wt 163.2 lb

## 2015-11-30 DIAGNOSIS — I480 Paroxysmal atrial fibrillation: Secondary | ICD-10-CM

## 2015-11-30 DIAGNOSIS — I10 Essential (primary) hypertension: Secondary | ICD-10-CM | POA: Diagnosis not present

## 2015-11-30 DIAGNOSIS — I4729 Other ventricular tachycardia: Secondary | ICD-10-CM

## 2015-11-30 DIAGNOSIS — I442 Atrioventricular block, complete: Secondary | ICD-10-CM

## 2015-11-30 DIAGNOSIS — I472 Ventricular tachycardia: Secondary | ICD-10-CM

## 2015-11-30 NOTE — Patient Instructions (Addendum)
Medication Instructions:   Your physician recommends that you continue on your current medications as directed. Please refer to the Current Medication list given to you today.   If you need a refill on your cardiac medications before your next appointment, please call your pharmacy.  Labwork: NONE ORDER TODAY    Testing/Procedures: NONE ORDER TODAY    Follow-Up:  Your physician wants you to follow-up in: ONE YEAR WITH  ALLREDY.[name] Kitchen.You will receive a reminder letter in the mail two months in advance. If you don't receive a letter, please call our office to schedule the follow-up appointment.  Remote monitoring is used to monitor your Pacemaker of ICD from home. This monitoring reduces the number of office visits required to check your device to one time per year. It allows us to keep an eye on the functioning of your device to ensure it is working properly. You are scheduled for a device check from home on .03/04/16..You may send your transmission at any time that day. If you have a wireless device, the transmission will be sent automatically. After your physician reviews your transmission, you will receive a postcard with your next transmission date.     Any Other Special Instructions Will Be Listed Below (If Applicable).                                                                                                                                                 \

## 2016-01-03 DIAGNOSIS — E0520 Thyrotoxicosis with toxic multinodular goiter without thyrotoxic crisis or storm: Secondary | ICD-10-CM | POA: Diagnosis not present

## 2016-01-03 DIAGNOSIS — E0590 Thyrotoxicosis, unspecified without thyrotoxic crisis or storm: Secondary | ICD-10-CM | POA: Diagnosis not present

## 2016-01-05 ENCOUNTER — Other Ambulatory Visit: Payer: Self-pay | Admitting: Internal Medicine

## 2016-01-05 DIAGNOSIS — E0520 Thyrotoxicosis with toxic multinodular goiter without thyrotoxic crisis or storm: Secondary | ICD-10-CM

## 2016-01-05 DIAGNOSIS — E0590 Thyrotoxicosis, unspecified without thyrotoxic crisis or storm: Secondary | ICD-10-CM

## 2016-01-10 ENCOUNTER — Ambulatory Visit
Admission: RE | Admit: 2016-01-10 | Discharge: 2016-01-10 | Disposition: A | Discharge status: Home / Self Care | Payer: PPO | Source: Ambulatory Visit | Attending: Internal Medicine | Admitting: Internal Medicine

## 2016-01-10 DIAGNOSIS — E0590 Thyrotoxicosis, unspecified without thyrotoxic crisis or storm: Secondary | ICD-10-CM

## 2016-01-10 DIAGNOSIS — E0520 Thyrotoxicosis with toxic multinodular goiter without thyrotoxic crisis or storm: Secondary | ICD-10-CM

## 2016-01-10 DIAGNOSIS — E042 Nontoxic multinodular goiter: Secondary | ICD-10-CM | POA: Diagnosis not present

## 2016-01-18 ENCOUNTER — Other Ambulatory Visit (HOSPITAL_COMMUNITY): Payer: Self-pay | Admitting: Internal Medicine

## 2016-01-18 DIAGNOSIS — E0590 Thyrotoxicosis, unspecified without thyrotoxic crisis or storm: Secondary | ICD-10-CM

## 2016-01-24 ENCOUNTER — Encounter (HOSPITAL_COMMUNITY)
Admission: RE | Admit: 2016-01-24 | Discharge: 2016-01-24 | Disposition: A | Discharge status: Home / Self Care | Payer: PPO | Source: Ambulatory Visit | Attending: Internal Medicine | Admitting: Internal Medicine

## 2016-01-24 DIAGNOSIS — E041 Nontoxic single thyroid nodule: Secondary | ICD-10-CM | POA: Diagnosis not present

## 2016-01-24 DIAGNOSIS — E0590 Thyrotoxicosis, unspecified without thyrotoxic crisis or storm: Secondary | ICD-10-CM | POA: Diagnosis not present

## 2016-01-24 DIAGNOSIS — R938 Abnormal findings on diagnostic imaging of other specified body structures: Secondary | ICD-10-CM | POA: Diagnosis not present

## 2016-01-24 DIAGNOSIS — E042 Nontoxic multinodular goiter: Secondary | ICD-10-CM | POA: Diagnosis not present

## 2016-01-24 MED ORDER — TECHNETIUM TC 99M MEDRONATE IV KIT
25.0000 | PACK | Freq: Once | INTRAVENOUS | Status: AC | PRN
  Administered 2016-01-24: 29.2 via INTRAVENOUS

## 2016-02-16 DIAGNOSIS — E119 Type 2 diabetes mellitus without complications: Secondary | ICD-10-CM | POA: Diagnosis not present

## 2016-03-04 ENCOUNTER — Ambulatory Visit (INDEPENDENT_AMBULATORY_CARE_PROVIDER_SITE_OTHER): Payer: PPO | Admitting: *Deleted

## 2016-03-04 ENCOUNTER — Telehealth: Payer: Self-pay | Admitting: Cardiology

## 2016-03-04 DIAGNOSIS — E0590 Thyrotoxicosis, unspecified without thyrotoxic crisis or storm: Secondary | ICD-10-CM | POA: Diagnosis not present

## 2016-03-04 DIAGNOSIS — I442 Atrioventricular block, complete: Secondary | ICD-10-CM

## 2016-03-04 NOTE — Telephone Encounter (Signed)
Spoke with pt and reminded pt of remote transmission that is due today. Pt verbalized understanding.   

## 2016-03-04 NOTE — Progress Notes (Signed)
Remote pacemaker transmission.   

## 2016-03-05 LAB — CUP PACEART REMOTE DEVICE CHECK
Battery Impedance: 330 Ohm
Battery Remaining Longevity: 86 mo
Battery Voltage: 2.79 V
Brady Statistic AP VP Percent: 76 %
Brady Statistic AP VS Percent: 0 %
Brady Statistic AS VP Percent: 23 %
Brady Statistic AS VS Percent: 0 %
Date Time Interrogation Session: 20170619133218
Implantable Lead Implant Date: 20050509
Implantable Lead Implant Date: 20050509
Implantable Lead Location: 753859
Implantable Lead Location: 753860
Implantable Lead Model: 5076
Implantable Lead Model: 5092
Lead Channel Impedance Value: 416 Ohm
Lead Channel Impedance Value: 545 Ohm
Lead Channel Pacing Threshold Amplitude: 0.5 V
Lead Channel Pacing Threshold Amplitude: 0.75 V
Lead Channel Pacing Threshold Pulse Width: 0.4 ms
Lead Channel Pacing Threshold Pulse Width: 0.4 ms
Lead Channel Sensing Intrinsic Amplitude: 2.8 mV
Lead Channel Setting Pacing Amplitude: 2 V
Lead Channel Setting Pacing Amplitude: 2.5 V
Lead Channel Setting Pacing Pulse Width: 0.4 ms
Lead Channel Setting Sensing Sensitivity: 4 mV

## 2016-03-08 ENCOUNTER — Encounter: Payer: Self-pay | Admitting: Cardiology

## 2016-03-25 DIAGNOSIS — H26493 Other secondary cataract, bilateral: Secondary | ICD-10-CM | POA: Diagnosis not present

## 2016-03-27 ENCOUNTER — Encounter: Payer: Self-pay | Admitting: Cardiology

## 2016-03-27 DIAGNOSIS — I10 Essential (primary) hypertension: Secondary | ICD-10-CM | POA: Diagnosis not present

## 2016-03-27 DIAGNOSIS — Z7984 Long term (current) use of oral hypoglycemic drugs: Secondary | ICD-10-CM | POA: Diagnosis not present

## 2016-03-27 DIAGNOSIS — F09 Unspecified mental disorder due to known physiological condition: Secondary | ICD-10-CM | POA: Diagnosis not present

## 2016-03-27 DIAGNOSIS — E1122 Type 2 diabetes mellitus with diabetic chronic kidney disease: Secondary | ICD-10-CM | POA: Diagnosis not present

## 2016-03-27 DIAGNOSIS — N183 Chronic kidney disease, stage 3 (moderate): Secondary | ICD-10-CM | POA: Diagnosis not present

## 2016-03-27 DIAGNOSIS — E7800 Pure hypercholesterolemia, unspecified: Secondary | ICD-10-CM | POA: Diagnosis not present

## 2016-04-01 DIAGNOSIS — H26492 Other secondary cataract, left eye: Secondary | ICD-10-CM | POA: Diagnosis not present

## 2016-04-03 DIAGNOSIS — E0590 Thyrotoxicosis, unspecified without thyrotoxic crisis or storm: Secondary | ICD-10-CM | POA: Diagnosis not present

## 2016-04-03 DIAGNOSIS — E0520 Thyrotoxicosis with toxic multinodular goiter without thyrotoxic crisis or storm: Secondary | ICD-10-CM | POA: Diagnosis not present

## 2016-04-05 ENCOUNTER — Emergency Department (HOSPITAL_COMMUNITY)
Admission: EM | Admit: 2016-04-05 | Discharge: 2016-04-05 | Disposition: A | Discharge status: Home / Self Care | Payer: PPO | Attending: Emergency Medicine | Admitting: Emergency Medicine

## 2016-04-05 ENCOUNTER — Encounter (HOSPITAL_COMMUNITY): Payer: Self-pay | Admitting: Emergency Medicine

## 2016-04-05 DIAGNOSIS — N183 Chronic kidney disease, stage 3 (moderate): Secondary | ICD-10-CM | POA: Insufficient documentation

## 2016-04-05 DIAGNOSIS — M546 Pain in thoracic spine: Secondary | ICD-10-CM | POA: Insufficient documentation

## 2016-04-05 DIAGNOSIS — Z950 Presence of cardiac pacemaker: Secondary | ICD-10-CM | POA: Insufficient documentation

## 2016-04-05 DIAGNOSIS — Z7984 Long term (current) use of oral hypoglycemic drugs: Secondary | ICD-10-CM | POA: Insufficient documentation

## 2016-04-05 DIAGNOSIS — Z7901 Long term (current) use of anticoagulants: Secondary | ICD-10-CM | POA: Insufficient documentation

## 2016-04-05 DIAGNOSIS — Z87891 Personal history of nicotine dependence: Secondary | ICD-10-CM | POA: Diagnosis not present

## 2016-04-05 DIAGNOSIS — I2510 Atherosclerotic heart disease of native coronary artery without angina pectoris: Secondary | ICD-10-CM | POA: Diagnosis not present

## 2016-04-05 DIAGNOSIS — E119 Type 2 diabetes mellitus without complications: Secondary | ICD-10-CM | POA: Diagnosis not present

## 2016-04-05 DIAGNOSIS — I129 Hypertensive chronic kidney disease with stage 1 through stage 4 chronic kidney disease, or unspecified chronic kidney disease: Secondary | ICD-10-CM | POA: Insufficient documentation

## 2016-04-05 DIAGNOSIS — Z79899 Other long term (current) drug therapy: Secondary | ICD-10-CM | POA: Insufficient documentation

## 2016-04-05 DIAGNOSIS — M542 Cervicalgia: Secondary | ICD-10-CM

## 2016-04-05 DIAGNOSIS — J449 Chronic obstructive pulmonary disease, unspecified: Secondary | ICD-10-CM | POA: Diagnosis not present

## 2016-04-05 LAB — COMPREHENSIVE METABOLIC PANEL
ALT: 20 U/L (ref 17–63)
AST: 25 U/L (ref 15–41)
Albumin: 4.1 g/dL (ref 3.5–5.0)
Alkaline Phosphatase: 59 U/L (ref 38–126)
Anion gap: 7 (ref 5–15)
BUN: 25 mg/dL — ABNORMAL HIGH (ref 6–20)
CO2: 24 mmol/L (ref 22–32)
Calcium: 9.5 mg/dL (ref 8.9–10.3)
Chloride: 108 mmol/L (ref 101–111)
Creatinine, Ser: 1.46 mg/dL — ABNORMAL HIGH (ref 0.61–1.24)
GFR calc Af Amer: 50 mL/min — ABNORMAL LOW (ref >60)
GFR calc non Af Amer: 43 mL/min — ABNORMAL LOW (ref >60)
Glucose, Bld: 171 mg/dL — ABNORMAL HIGH (ref 65–99)
Potassium: 4.2 mmol/L (ref 3.5–5.1)
Sodium: 139 mmol/L (ref 135–145)
Total Bilirubin: 0.8 mg/dL (ref 0.3–1.2)
Total Protein: 6.5 g/dL (ref 6.5–8.1)

## 2016-04-05 LAB — CBC WITH DIFFERENTIAL/PLATELET
Basophils Absolute: 0 10*3/uL (ref 0.0–0.1)
Basophils Relative: 0 %
Eosinophils Absolute: 0.2 10*3/uL (ref 0.0–0.7)
Eosinophils Relative: 3 %
HCT: 42.7 % (ref 39.0–52.0)
Hemoglobin: 14 g/dL (ref 13.0–17.0)
Lymphocytes Relative: 22 %
Lymphs Abs: 1.4 10*3/uL (ref 0.7–4.0)
MCH: 26.3 pg (ref 26.0–34.0)
MCHC: 32.8 g/dL (ref 30.0–36.0)
MCV: 80.3 fL (ref 78.0–100.0)
Monocytes Absolute: 0.4 10*3/uL (ref 0.1–1.0)
Monocytes Relative: 6 %
Neutro Abs: 4.4 10*3/uL (ref 1.7–7.7)
Neutrophils Relative %: 69 %
Platelets: 220 10*3/uL (ref 150–400)
RBC: 5.32 MIL/uL (ref 4.22–5.81)
RDW: 17 % — ABNORMAL HIGH (ref 11.5–15.5)
WBC: 6.4 10*3/uL (ref 4.0–10.5)

## 2016-04-05 LAB — I-STAT TROPONIN, ED: Troponin i, poc: 0 ng/mL (ref 0.00–0.08)

## 2016-04-05 MED ORDER — HYDROCODONE-ACETAMINOPHEN 5-325 MG PO TABS: 1.0000 | ORAL_TABLET | Freq: Once | ORAL | Status: DC

## 2016-04-05 MED ORDER — METHOCARBAMOL 500 MG PO TABS: 500.0000 mg | ORAL_TABLET | Freq: Three times a day (TID) | ORAL | Status: DC | PRN

## 2016-04-05 MED ORDER — HYDROMORPHONE HCL 1 MG/ML IJ SOLN
0.5000 mg | Freq: Once | INTRAMUSCULAR | Status: AC
  Administered 2016-04-05: 0.5 mg via INTRAMUSCULAR
  Filled 2016-04-05: qty 1

## 2016-04-05 MED ORDER — HYDROCODONE-ACETAMINOPHEN 5-325 MG PO TABS
1.0000 | ORAL_TABLET | Freq: Once | ORAL | Status: AC
  Administered 2016-04-05: 1 via ORAL
  Filled 2016-04-05: qty 1

## 2016-04-05 MED ORDER — METHOCARBAMOL 500 MG PO TABS
500.0000 mg | ORAL_TABLET | Freq: Once | ORAL | Status: AC
  Administered 2016-04-05: 500 mg via ORAL
  Filled 2016-04-05: qty 1

## 2016-04-05 NOTE — ED Notes (Signed)
Pt still states he is unable to attempt to have CT scan done, saying "I am in pain sitting up, I won't be able to lay flat." CT aware.

## 2016-04-05 NOTE — ED Notes (Signed)
Left shoulder pain and neck pain x2 months and got worse the last 2 days , has made him nauseous

## 2016-04-05 NOTE — ED Notes (Signed)
Pt verbalized understanding of d/c instructions, prescriptions, and follow-up care. No further questions/concerns, VSS, ambulatory w/ steady gait (refused wheelchair) 

## 2016-04-05 NOTE — ED Notes (Signed)
Fredirick MaudlinHolley Hall, RN witnessed Carilyn GoodpastureBrooke Ra Pfiester, Therapist, artN waste .5mg  dilaudid at 1815 - unable to waste in pyxis.

## 2016-04-05 NOTE — ED Notes (Signed)
Wheeled pt back to room from waiting room. 

## 2016-04-05 NOTE — Discharge Instructions (Signed)
Radicular Pain °Radicular pain in either the arm or leg is usually from a bulging or herniated disk in the spine. A piece of the herniated disk may press against the nerves as the nerves exit the spine. This causes pain which is felt at the tips of the nerves down the arm or leg. Other causes of radicular pain may include: °· Fractures. °· Heart disease. °· Cancer. °· An abnormal and usually degenerative state of the nervous system or nerves (neuropathy). °Diagnosis may require CT or MRI scanning to determine the primary cause.  °Nerves that start at the neck (nerve roots) may cause radicular pain in the outer shoulder and arm. It can spread down to the thumb and fingers. The symptoms vary depending on which nerve root has been affected. In most cases radicular pain improves with conservative treatment. Neck problems may require physical therapy, a neck collar, or cervical traction. Treatment may take many weeks, and surgery may be considered if the symptoms do not improve.  °Conservative treatment is also recommended for sciatica. Sciatica causes pain to radiate from the lower back or buttock area down the leg into the foot. Often there is a history of back problems. Most patients with sciatica are better after 2 to 4 weeks of rest and other supportive care. Short term bed rest can reduce the disk pressure considerably. Sitting, however, is not a good position since this increases the pressure on the disk. You should avoid bending, lifting, and all other activities which make the problem worse. Traction can be used in severe cases. Surgery is usually reserved for patients who do not improve within the first months of treatment. °Only take over-the-counter or prescription medicines for pain, discomfort, or fever as directed by your caregiver. Narcotics and muscle relaxants may help by relieving more severe pain and spasm and by providing mild sedation. Cold or massage can give significant relief. Spinal manipulation  is not recommended. It can increase the degree of disc protrusion. Epidural steroid injections are often effective treatment for radicular pain. These injections deliver medicine to the spinal nerve in the space between the protective covering of the spinal cord and back bones (vertebrae). Your caregiver can give you more information about steroid injections. These injections are most effective when given within two weeks of the onset of pain.  °You should see your caregiver for follow up care as recommended. A program for neck and back injury rehabilitation with stretching and strengthening exercises is an important part of management.  °SEEK IMMEDIATE MEDICAL CARE IF: °· You develop increased pain, weakness, or numbness in your arm or leg. °· You develop difficulty with bladder or bowel control. °· You develop abdominal pain. °  °This information is not intended to replace advice given to you by your health care provider. Make sure you discuss any questions you have with your health care provider. °  °Document Released: 10/10/2004 Document Revised: 09/23/2014 Document Reviewed: 03/29/2015 °Elsevier Interactive Patient Education ©2016 [employer] ° °

## 2016-04-05 NOTE — ED Notes (Signed)
Pt transported to CT, refused scan and stated he could not lay flat to tolerate scan. Pt and wife requested "shot of pain meds like morphine so pt could lay flat for scan." EDP aware.

## 2016-04-06 ENCOUNTER — Emergency Department (HOSPITAL_COMMUNITY)
Admission: EM | Admit: 2016-04-06 | Discharge: 2016-04-06 | Disposition: A | Discharge status: Home / Self Care | Payer: PPO | Attending: Emergency Medicine | Admitting: Emergency Medicine

## 2016-04-06 ENCOUNTER — Emergency Department (HOSPITAL_COMMUNITY): Payer: PPO

## 2016-04-06 ENCOUNTER — Encounter (HOSPITAL_COMMUNITY): Payer: Self-pay | Admitting: Emergency Medicine

## 2016-04-06 DIAGNOSIS — J449 Chronic obstructive pulmonary disease, unspecified: Secondary | ICD-10-CM | POA: Insufficient documentation

## 2016-04-06 DIAGNOSIS — Z7901 Long term (current) use of anticoagulants: Secondary | ICD-10-CM | POA: Insufficient documentation

## 2016-04-06 DIAGNOSIS — N183 Chronic kidney disease, stage 3 (moderate): Secondary | ICD-10-CM | POA: Insufficient documentation

## 2016-04-06 DIAGNOSIS — Z79899 Other long term (current) drug therapy: Secondary | ICD-10-CM | POA: Insufficient documentation

## 2016-04-06 DIAGNOSIS — M549 Dorsalgia, unspecified: Secondary | ICD-10-CM | POA: Diagnosis not present

## 2016-04-06 DIAGNOSIS — M546 Pain in thoracic spine: Secondary | ICD-10-CM | POA: Diagnosis not present

## 2016-04-06 DIAGNOSIS — I2510 Atherosclerotic heart disease of native coronary artery without angina pectoris: Secondary | ICD-10-CM | POA: Diagnosis not present

## 2016-04-06 DIAGNOSIS — M479 Spondylosis, unspecified: Secondary | ICD-10-CM | POA: Diagnosis not present

## 2016-04-06 DIAGNOSIS — M4302 Spondylolysis, cervical region: Secondary | ICD-10-CM | POA: Diagnosis not present

## 2016-04-06 DIAGNOSIS — I129 Hypertensive chronic kidney disease with stage 1 through stage 4 chronic kidney disease, or unspecified chronic kidney disease: Secondary | ICD-10-CM | POA: Diagnosis not present

## 2016-04-06 DIAGNOSIS — Z87891 Personal history of nicotine dependence: Secondary | ICD-10-CM | POA: Insufficient documentation

## 2016-04-06 DIAGNOSIS — M542 Cervicalgia: Secondary | ICD-10-CM | POA: Diagnosis not present

## 2016-04-06 DIAGNOSIS — E1122 Type 2 diabetes mellitus with diabetic chronic kidney disease: Secondary | ICD-10-CM | POA: Diagnosis not present

## 2016-04-06 DIAGNOSIS — I6521 Occlusion and stenosis of right carotid artery: Secondary | ICD-10-CM | POA: Diagnosis not present

## 2016-04-06 DIAGNOSIS — I6523 Occlusion and stenosis of bilateral carotid arteries: Secondary | ICD-10-CM | POA: Diagnosis not present

## 2016-04-06 DIAGNOSIS — Z950 Presence of cardiac pacemaker: Secondary | ICD-10-CM | POA: Insufficient documentation

## 2016-04-06 DIAGNOSIS — M47812 Spondylosis without myelopathy or radiculopathy, cervical region: Secondary | ICD-10-CM

## 2016-04-06 DIAGNOSIS — I10 Essential (primary) hypertension: Secondary | ICD-10-CM | POA: Diagnosis not present

## 2016-04-06 LAB — CBC WITH DIFFERENTIAL/PLATELET
Basophils Absolute: 0 10*3/uL (ref 0.0–0.1)
Basophils Relative: 0 %
Eosinophils Absolute: 0 10*3/uL (ref 0.0–0.7)
Eosinophils Relative: 0 %
HCT: 41.5 % (ref 39.0–52.0)
Hemoglobin: 13.6 g/dL (ref 13.0–17.0)
Lymphocytes Relative: 13 %
Lymphs Abs: 1 10*3/uL (ref 0.7–4.0)
MCH: 26 pg (ref 26.0–34.0)
MCHC: 32.8 g/dL (ref 30.0–36.0)
MCV: 79.3 fL (ref 78.0–100.0)
Monocytes Absolute: 0.5 10*3/uL (ref 0.1–1.0)
Monocytes Relative: 6 %
Neutro Abs: 6.7 10*3/uL (ref 1.7–7.7)
Neutrophils Relative %: 81 %
Platelets: 227 10*3/uL (ref 150–400)
RBC: 5.23 MIL/uL (ref 4.22–5.81)
RDW: 16.7 % — ABNORMAL HIGH (ref 11.5–15.5)
WBC: 8.3 10*3/uL (ref 4.0–10.5)

## 2016-04-06 LAB — BASIC METABOLIC PANEL
Anion gap: 8 (ref 5–15)
BUN: 19 mg/dL (ref 6–20)
CO2: 22 mmol/L (ref 22–32)
Calcium: 9 mg/dL (ref 8.9–10.3)
Chloride: 103 mmol/L (ref 101–111)
Creatinine, Ser: 1.19 mg/dL (ref 0.61–1.24)
GFR calc Af Amer: >60 mL/min (ref >60)
GFR calc non Af Amer: 55 mL/min — ABNORMAL LOW (ref >60)
Glucose, Bld: 170 mg/dL — ABNORMAL HIGH (ref 65–99)
Potassium: 3.7 mmol/L (ref 3.5–5.1)
Sodium: 133 mmol/L — ABNORMAL LOW (ref 135–145)

## 2016-04-06 MED ORDER — METHOCARBAMOL 1000 MG/10ML IJ SOLN
1000.0000 mg | Freq: Once | INTRAVENOUS | Status: AC
  Administered 2016-04-06: 1000 mg via INTRAVENOUS
  Filled 2016-04-06: qty 10

## 2016-04-06 MED ORDER — DIAZEPAM 5 MG/ML IJ SOLN
5.0000 mg | Freq: Once | INTRAMUSCULAR | Status: AC
  Administered 2016-04-06: 5 mg via INTRAVENOUS
  Filled 2016-04-06: qty 2

## 2016-04-06 MED ORDER — HYDROMORPHONE HCL 1 MG/ML IJ SOLN
1.0000 mg | Freq: Once | INTRAMUSCULAR | Status: AC
Start: 2016-04-06 — End: 2016-04-06
  Administered 2016-04-06: 1 mg via INTRAVENOUS
  Filled 2016-04-06: qty 1

## 2016-04-06 MED ORDER — IOPAMIDOL (ISOVUE-300) INJECTION 61%
INTRAVENOUS | Status: AC
  Filled 2016-04-06: qty 75

## 2016-04-06 MED ORDER — DIAZEPAM 5 MG PO TABS: 5.0000 mg | ORAL_TABLET | Freq: Three times a day (TID) | ORAL | Status: DC | PRN

## 2016-04-06 NOTE — ED Notes (Signed)
PA student at bedside.

## 2016-04-06 NOTE — Discharge Instructions (Signed)
Read the information below.  Use the prescribed medication as directed.  Please discuss all new medications with your pharmacist.  You may return to the Emergency Department at any time for worsening condition or any new symptoms that concern you.  Please be cautious in your use of sedating medication while trying to control your pain.   If you develop fevers, loss of control of bowel or bladder, weakness or numbness in your arms or legs, or are unable to walk, return to the ER for a recheck.

## 2016-04-06 NOTE — ED Provider Notes (Signed)
CSN: [id number]     Arrival date & time 04/06/16  7829 History   First MD Initiated Contact with Patient 04/06/16 [phone]     No chief complaint on file.    (Consider location/radiation/quality/duration/timing/severity/associated sxs/prior Treatment) The history is provided by the patient and medical records.     Pt with hx HTN, DM, COPD, CAD, CKD, afib, heart block s/p Medronic device p/w left sided neck and upper back pain that began two months ago while working out, worse over the past 2 days.  The pain was initially only present with working out and two days ago worsened while working out at [employer] and again while at home putting sheets on a bed.  The pain is exacerbated by raising his head and with extending his left arm forward.  Notes tingling in the posterior left arm.  Has tried tylenol, heating pad without improvement. Pt seen in ED yesterday though at this time chart not available in EPIC.  Nurse notes indicate he was uncomfortable and unable to tolerate CT scan of his cervical spine.  Took prescribed medications this morning without improvement.  Denies fevers, recent falls, weakness, CP, SOB, cough.     Past Medical History  Diagnosis Date  . Complete heart block (HCC) 01/23/04    s/p Medtronic PPM implanted by Dr Amil Amen  . Hypertension   . Diabetes mellitus   . COPD (chronic obstructive pulmonary disease) (HCC)   . Carotid artery occlusion   . Hyperlipidemia   . BPH (benign prostatic hyperplasia)   . Glaucoma   . Cataracts, bilateral   . Kidney stones   . Allergic rhinitis, cause unspecified   . CAD (coronary artery disease)   . Chronic kidney disease (CKD), stage III (moderate)   . Paroxysmal atrial fibrillation (HCC)     detected on PPM interrogation  . 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 PAF (paroxysmal atrial fibrillation) (HCC) 10/09/2014    Noted on pacer check with 88 mode switches and longest episode >1 hour.  Now on Apixiban for CHADS2VASC score of 5     Past Surgical History  Procedure Laterality Date  . Pacemaker insertion  2005, 04/16/12    MDT implanted by Dr Amil Amen with generator chnage (MDT Adapta L) by Dr Johney Frame 04/16/12  . Permanent pacemaker generator change N/A 04/16/2012    Procedure: PERMANENT PACEMAKER GENERATOR CHANGE;  Surgeon: Hillis Range, MD;  Location: [facility] CATH LAB;  Service: Cardiovascular;  Laterality: N/A;   Family History  Problem Relation Age of Onset  . Heart disease Father   . Heart attack Father    Social History  Substance Use Topics  . Smoking status: Former Smoker    Types: Cigarettes    Quit date: 09/16/1998  . Smokeless tobacco: Never Used  . Alcohol Use: 3.0 oz/week    2 Glasses of wine, 3 Shots of liquor per week     Comment: drinks a tequila shot daily    Review of Systems  Constitutional: Negative for fever and chills.  All other systems reviewed and are negative.     Allergies  Penicillins  Home Medications   Prior to Admission medications   Medication Sig Start Date End Date Taking? Authorizing Provider  amLODipine (NORVASC) 5 MG tablet TAKE 1 TABLET (5 MG TOTAL) BY MOUTH DAILY. 11/24/15   Quintella Reichert, MD  atorvastatin (LIPITOR) 40 MG tablet Take 40 mg by mouth every evening.     Historical Provider, MD  cetirizine (ZYRTEC) 10  MG tablet Take 10 mg by mouth daily. For allergies    Historical Provider, MD  ELIQUIS 2.5 MG TABS tablet TAKE 1 TABLET (2.5 MG TOTAL) BY MOUTH 2 (TWO) TIMES DAILY. 11/24/15   Quintella Reichert, MD  fluticasone (FLONASE) 50 MCG/ACT nasal spray Place 2 sprays into both nostrils daily as needed for allergies or rhinitis.    Historical Provider, MD  glipiZIDE (GLUCOTROL) 5 MG tablet Take 5 mg by mouth daily before breakfast.      Historical Provider, MD  HYDROcodone-acetaminophen (NORCO/VICODIN) 5-325 MG tablet Take 1-2 tablets by mouth once. 04/05/16   Loren Racer, MD  methocarbamol (ROBAXIN) 500 MG tablet Take 1 tablet (500 mg total) by mouth every 8 (eight) hours  as needed for muscle spasms. 04/05/16   Loren Racer, MD  pioglitazone (ACTOS) 45 MG tablet Take 45 mg by mouth daily.  11/24/15   Historical Provider, MD   BP 170/74 mmHg  Pulse 75  Temp(Src) 97.7 F (36.5 C) (Oral)  Resp 18  SpO2 95% Physical Exam  Constitutional: He appears well-developed and well-nourished. No distress.  HENT:  Head: Normocephalic and atraumatic.  Neck: Neck supple.  Pt holds head with neck flexed.  Able to move left and right without difficulty.    Cardiovascular: Normal rate and regular rhythm.   Pulmonary/Chest: Effort normal and breath sounds normal. No respiratory distress. He has no wheezes. He has no rales.  Musculoskeletal:       Back:  Spine nontender, no crepitus, or stepoffs. Mild muscular tenderness over paraspinal muscles in left thoracic area.  Upper extremities:  Strength 5/5, sensation intact, distal pulses intact.     Neurological: He is alert.  Skin: He is not diaphoretic.  Nursing note and vitals reviewed.   ED Course  Procedures (including critical care time) Labs Review Labs Reviewed  CBC WITH DIFFERENTIAL/PLATELET - Abnormal; Notable for the following:    RDW 16.7 (*)    All other components within normal limits  BASIC METABOLIC PANEL - Abnormal; Notable for the following:    Sodium 133 (*)    Glucose, Bld 170 (*)    GFR calc non Af Amer 55 (*)    All other components within normal limits    Imaging Review Dg Cervical Spine Complete  04/06/2016  CLINICAL DATA:  Posterior neck pain with worsening today. Radiates to left shoulder. Decreased range of motion. No reported injury. EXAM: CERVICAL SPINE - COMPLETE 4+ VIEW COMPARISON:  None. FINDINGS: Cervical spine is visualized from the occiput to the cervicothoracic junction. There is reversal of the normal cervical lordosis. Alignment is otherwise anatomic. Vertebral body height is maintained. Mild endplate degenerative changes are seen throughout. Findings are worst at C5-6. Loss of  disc space height at C2-3, C5-6 and C6-7. Multilevel uncovertebral hypertrophy and facet sclerosis. Prevertebral soft tissues are within normal limits. Neural foramina are poorly assessed due to positioning. Dens is completely obscured on dedicated view. Lung apices are grossly clear. IMPRESSION: 1. Suboptimal exam in positioning due to patient's pain. 2. Reversal of the normal cervical lordosis without subluxation or fracture. 3. Multilevel degenerative disc disease, worst at C5-6. Electronically Signed   By: Leanna Battles M.D.   On: 04/06/2016 08:30   Dg Thoracic Spine 2 View  04/06/2016  CLINICAL DATA:  Left-sided pain radiates to the left shoulder, no reported injury, decreased range of motion. EXAM: THORACIC SPINE 2 VIEWS COMPARISON:  Chest radiograph 04/06/2013. FINDINGS: A lateral view could not be obtained due to the  patient's pain. AP view shows anatomic alignment and grossly maintained vertebral body height. IMPRESSION: Limited examination due to patient condition. Lateral view not obtained. Alignment is grossly anatomic. Electronically Signed   By: Leanna Battles M.D.   On: 04/06/2016 08:32   Ct Soft Tissue Neck W Contrast  04/06/2016  CLINICAL DATA:  Neck pain of recent onset EXAM: CT NECK WITH CONTRAST TECHNIQUE: Multidetector CT imaging of the neck was performed using the standard protocol following the bolus administration of intravenous contrast. CONTRAST:  60 cc Isovue-300 COMPARISON:  None. FINDINGS: Pharynx and larynx: No mucosal or submucosal lesion. Salivary glands: Parotid and submandibular glands are normal. Thyroid: Heterogeneous thyroid most consistent with multinodular goiter. Left lobe is larger than the right. No evidence of dominant focal lesion. Lymph nodes: No enlarged or low-density nodes on either side of the neck. Vascular: Atherosclerosis at both carotid bifurcations. Severe stenosis of the right ICA at the distal bulb, stenosis possibly 70% or greater. Limited  intracranial: Negative Visualized orbits: Normal Mastoids and visualized paranasal sinuses: Clear Skeleton: Ordinary cervical spondylosis Upper chest: Mild apical scarring.  Aortic atherosclerosis. IMPRESSION: No cause of acute neck pain is identified. Thyroid goiter, asymmetrically more prominent on the left. Severe stenosis of the right ICA at the distal bulb, possibly 70%. Aortic atherosclerosis. Electronically Signed   By: Paulina Fusi M.D.   On: 04/06/2016 10:57   Ct Cervical Spine Wo Contrast  04/06/2016  CLINICAL DATA:  Upper back and left shoulder pain for 2 days. Neck pain. EXAM: CT CERVICAL SPINE WITHOUT CONTRAST CT THORACIC SPINE WITHOUT CONTRAST TECHNIQUE: Multidetector CT imaging of the cervical and thoracic spine was performed without contrast. Multiplanar CT image reconstructions were also generated. COMPARISON:  Neck CT, dictated separately. Cervical and thoracic plain films, dictated separately. FINDINGS: CT CERVICAL SPINE FINDINGS Spinal visualization through the mid T2 level. Prevertebral soft tissues are within normal limits. Bilateral carotid atherosclerosis, better evaluated on contrast-enhanced study. Thyroid enlargement, heterogeneity is worse on the left. Multilevel cervical spondylosis. This is most significant at C5-6, were disc osteophyte complex causes central canal stenosis and right worse than left neural foraminal narrowing. Skull base intact. Maintenance of vertebral body height and alignment. Loss of intervertebral disc height at C2-3, C4-5, and C5-6. Disc desiccation also identified. Scattered vacuum disc phenomenon at C6-7. Facet arthropathy which is most significant on the left at C2-3 and C3-4. Coronal reformats demonstrate a normal C1-C2 articulation. No focal osseous lesion. CT THORACIC SPINE FINDINGS The entire thoracic spine is imaged. No paravertebral soft tissue abnormality identified. Descending thoracic aortic atherosclerosis. Incompletely imaged pacer. Calcified  mediastinal nodes are consistent with old granulomatous disease. Thoracic vertebral body height is maintained. Facets are well-aligned. Loss of intervertebral disc height and vacuum disc phenomenon at multiple levels. No dominant disc abnormality identified. No focal osseous lesion. IMPRESSION: 1. Cervical and less so thoracic spondylosis, without acute findings. 2. aortic atherosclerosis. Electronically Signed   By: Jeronimo Greaves M.D.   On: 04/06/2016 11:15   Ct Thoracic Spine Wo Contrast  04/06/2016  CLINICAL DATA:  Upper back and left shoulder pain for 2 days. Neck pain. EXAM: CT CERVICAL SPINE WITHOUT CONTRAST CT THORACIC SPINE WITHOUT CONTRAST TECHNIQUE: Multidetector CT imaging of the cervical and thoracic spine was performed without contrast. Multiplanar CT image reconstructions were also generated. COMPARISON:  Neck CT, dictated separately. Cervical and thoracic plain films, dictated separately. FINDINGS: CT CERVICAL SPINE FINDINGS Spinal visualization through the mid T2 level. Prevertebral soft tissues are within normal limits. Bilateral carotid  atherosclerosis, better evaluated on contrast-enhanced study. Thyroid enlargement, heterogeneity is worse on the left. Multilevel cervical spondylosis. This is most significant at C5-6, were disc osteophyte complex causes central canal stenosis and right worse than left neural foraminal narrowing. Skull base intact. Maintenance of vertebral body height and alignment. Loss of intervertebral disc height at C2-3, C4-5, and C5-6. Disc desiccation also identified. Scattered vacuum disc phenomenon at C6-7. Facet arthropathy which is most significant on the left at C2-3 and C3-4. Coronal reformats demonstrate a normal C1-C2 articulation. No focal osseous lesion. CT THORACIC SPINE FINDINGS The entire thoracic spine is imaged. No paravertebral soft tissue abnormality identified. Descending thoracic aortic atherosclerosis. Incompletely imaged pacer. Calcified mediastinal  nodes are consistent with old granulomatous disease. Thoracic vertebral body height is maintained. Facets are well-aligned. Loss of intervertebral disc height and vacuum disc phenomenon at multiple levels. No dominant disc abnormality identified. No focal osseous lesion. IMPRESSION: 1. Cervical and less so thoracic spondylosis, without acute findings. 2. aortic atherosclerosis. Electronically Signed   By: Jeronimo Greaves M.D.   On: 04/06/2016 11:15     EKG Interpretation None      MDM   Final diagnoses:  Neck pain  Cervical spondylosis  Carotid artery stenosis, right    Afebrile nontoxic patient with 2 months of left neck/left upper back pain and left arm tingling that has worsened two days ago.  Associated with activity and specific movements.  No meningeal signs.  Discussed pt with Dr Juliann Pares who also saw and examined the patient.  CT neck, c-spine, t-spine show no concerning findings-  Chronic spondylosis, known thyroid problem, right ICA stenosis.  Thyroid and ICA thought to be unrelated to current problem, which is more likely muscular/muscle spasm.  MRI not emergently indicated and pt has pacemaker.  Pt greatly improved with valium, able to lift head and move around more easily.  Pt and wife cautioned on the use of sedating medications as he has vicodin at home and I gave him and prescribed him valium - discussed the usage of these.  He has a PCP appointment in two days with Dr Nehemiah Settle and will follow up there.  D/C home with valium.  Discussed result, findings, treatment, and follow up  with patient.  Pt given return precautions.  Pt verbalizes understanding and agrees with plan.      I doubt any other EMC precluding discharge at this time including, but not necessarily limited to the following: meningitis, deep space infection, acute cord syndrome, ACS, PE, aortic dissection, pneumothorax.      Trixie Dredge, PA-C 04/06/16 1341  Kristen N Ward, DO 04/06/16 2303

## 2016-04-06 NOTE — ED Notes (Signed)
Irving Burton, PA at bedside with patient and spouse at this time.

## 2016-04-06 NOTE — ED Notes (Signed)
Pt sitting upright in bed with discomfort, after valium admin pt able to be reposition prone.

## 2016-04-06 NOTE — ED Notes (Signed)
Patient transported to X-ray 

## 2016-04-06 NOTE — ED Notes (Signed)
MD at bedside. 

## 2016-04-06 NOTE — ED Notes (Signed)
Patient comes from home. C/o upper back pain and L shoulder pain x 2 days. Also, c/o vomited twice in past 24 hours.

## 2016-04-08 DIAGNOSIS — I6521 Occlusion and stenosis of right carotid artery: Secondary | ICD-10-CM | POA: Diagnosis not present

## 2016-04-08 DIAGNOSIS — M542 Cervicalgia: Secondary | ICD-10-CM | POA: Diagnosis not present

## 2016-04-08 MED ORDER — IOPAMIDOL (ISOVUE-300) INJECTION 61%
75.0000 mL | Freq: Once | INTRAVENOUS | Status: AC | PRN
  Administered 2016-04-06: 75 mL via INTRAVENOUS

## 2016-04-09 ENCOUNTER — Other Ambulatory Visit: Payer: Self-pay | Admitting: Vascular Surgery

## 2016-04-09 ENCOUNTER — Other Ambulatory Visit: Payer: Self-pay | Admitting: *Deleted

## 2016-04-09 DIAGNOSIS — I6523 Occlusion and stenosis of bilateral carotid arteries: Secondary | ICD-10-CM

## 2016-04-10 ENCOUNTER — Encounter: Payer: Self-pay | Admitting: Vascular Surgery

## 2016-04-11 ENCOUNTER — Ambulatory Visit (HOSPITAL_COMMUNITY)
Admission: RE | Admit: 2016-04-11 | Discharge: 2016-04-11 | Disposition: A | Discharge status: Home / Self Care | Payer: PPO | Source: Ambulatory Visit | Attending: Vascular Surgery | Admitting: Vascular Surgery

## 2016-04-11 ENCOUNTER — Ambulatory Visit (INDEPENDENT_AMBULATORY_CARE_PROVIDER_SITE_OTHER): Payer: PPO | Admitting: Vascular Surgery

## 2016-04-11 ENCOUNTER — Encounter: Payer: Self-pay | Admitting: Vascular Surgery

## 2016-04-11 VITALS — BP 177/88 | HR 74 | Temp 97.9°F | Resp 16 | Ht 68.0 in | Wt 155.0 lb

## 2016-04-11 DIAGNOSIS — I2510 Atherosclerotic heart disease of native coronary artery without angina pectoris: Secondary | ICD-10-CM | POA: Insufficient documentation

## 2016-04-11 DIAGNOSIS — I6523 Occlusion and stenosis of bilateral carotid arteries: Secondary | ICD-10-CM

## 2016-04-11 DIAGNOSIS — G4700 Insomnia, unspecified: Secondary | ICD-10-CM | POA: Insufficient documentation

## 2016-04-11 DIAGNOSIS — E785 Hyperlipidemia, unspecified: Secondary | ICD-10-CM | POA: Diagnosis not present

## 2016-04-11 DIAGNOSIS — E1122 Type 2 diabetes mellitus with diabetic chronic kidney disease: Secondary | ICD-10-CM | POA: Insufficient documentation

## 2016-04-11 DIAGNOSIS — I129 Hypertensive chronic kidney disease with stage 1 through stage 4 chronic kidney disease, or unspecified chronic kidney disease: Secondary | ICD-10-CM | POA: Insufficient documentation

## 2016-04-11 DIAGNOSIS — N183 Chronic kidney disease, stage 3 (moderate): Secondary | ICD-10-CM | POA: Diagnosis not present

## 2016-04-11 DIAGNOSIS — J449 Chronic obstructive pulmonary disease, unspecified: Secondary | ICD-10-CM | POA: Diagnosis not present

## 2016-04-11 LAB — VAS US CAROTID
LEFT ECA DIAS: -8 cm/s
Left CCA dist dias: 7 cm/s
Left CCA dist sys: 43 cm/s
Left CCA prox dias: 9 cm/s
Left CCA prox sys: 64 cm/s
Left ICA dist dias: -9 cm/s
Left ICA dist sys: -44 cm/s
Left ICA prox dias: -10 cm/s
Left ICA prox sys: -40 cm/s
RIGHT CCA MID DIAS: -7 cm/s
RIGHT ECA DIAS: 7 cm/s
Right CCA prox sys: 75 cm/s
Right cca dist sys: -51 cm/s

## 2016-04-11 NOTE — Progress Notes (Signed)
History of Present Illness:  Patient is a 82 y.o. year old male who presents for evaluation of carotid stenosis.  The patient denies symptoms of TIA, amaurosis, or stroke.  The patient is currently on Eliquis anticoagulation therapy.  The carotid stenosis was found CTA of the neck secondary to new neck pain sudden on set.    Other medical problems include A fib, CHF, DM, hyperlipidemia managed with Lipitor, HTN managed with Amlodipine.  These are currently stable and followed by Dr. Mayford Knife for cardiology and Dr. Nehemiah Settle as his primary care physician.  Past Medical History:  Diagnosis Date  . Allergic rhinitis, cause unspecified   . BPH (benign prostatic hyperplasia)   . CAD (coronary artery disease)   . Carotid artery occlusion   . Cataracts, bilateral   . Chronic kidney disease (CKD), stage III (moderate)   . Complete heart block (HCC) 01/23/04   s/p Medtronic PPM implanted by Dr Amil Amen  . COPD (chronic obstructive pulmonary disease) (HCC)   . Diabetes mellitus   . Glaucoma   . Hyperlipidemia   . Hypertension   . 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 Kidney stones   . PAF (paroxysmal atrial fibrillation) (HCC) 10/09/2014   Noted on pacer check with 88 mode switches and longest episode >1 hour.  Now on Apixiban for CHADS2VASC score of 5  . Paroxysmal atrial fibrillation (HCC)    detected on PPM interrogation    Past Surgical History:  Procedure Laterality Date  . PACEMAKER INSERTION  2005, 04/16/12   MDT implanted by Dr Amil Amen with generator chnage (MDT Adapta L) by Dr Johney Frame 04/16/12  . PERMANENT PACEMAKER GENERATOR CHANGE N/A 04/16/2012   Procedure: PERMANENT PACEMAKER GENERATOR CHANGE;  Surgeon: Hillis Range, MD;  Location: [facility] CATH LAB;  Service: Cardiovascular;  Laterality: N/A;     Social History Social History  Substance Use Topics  . Smoking status: Former Smoker    Types: Cigarettes    Quit date: 09/16/1998  . Smokeless tobacco: Never Used  . Alcohol use 3.0  oz/week    2 Glasses of wine, 3 Shots of liquor per week     Comment: drinks a tequila shot daily    Family History Family History  Problem Relation Age of Onset  . Heart disease Father   . Heart attack Father     Allergies  Allergies  Allergen Reactions  . Penicillins Rash     Current Outpatient Prescriptions  Medication Sig Dispense Refill  . amLODipine (NORVASC) 5 MG tablet TAKE 1 TABLET (5 MG TOTAL) BY MOUTH DAILY. 180 tablet 1  . atorvastatin (LIPITOR) 40 MG tablet Take 40 mg by mouth every evening.     . cetirizine (ZYRTEC) 10 MG tablet Take 10 mg by mouth daily. For allergies    . diazepam (VALIUM) 5 MG tablet Take 1 tablet (5 mg total) by mouth every 8 (eight) hours as needed (muscle spasm or pain). 15 tablet 0  . ELIQUIS 2.5 MG TABS tablet TAKE 1 TABLET (2.5 MG TOTAL) BY MOUTH 2 (TWO) TIMES DAILY. 60 tablet 5  . fluticasone (FLONASE) 50 MCG/ACT nasal spray Place 2 sprays into both nostrils daily as needed for allergies or rhinitis.    [name] Kitchen glipiZIDE (GLUCOTROL) 5 MG tablet Take 5 mg by mouth daily before breakfast.      . HYDROcodone-acetaminophen (NORCO/VICODIN) 5-325 MG tablet Take 1-2 tablets by mouth once. 10 tablet 0  . methocarbamol (ROBAXIN) 500 MG tablet Take 1 tablet (  500 mg total) by mouth every 8 (eight) hours as needed for muscle spasms. 30 tablet 0  . pioglitazone (ACTOS) 45 MG tablet Take 45 mg by mouth daily.     . predniSONE (DELTASONE) 10 MG tablet Take 10 mg by mouth daily with breakfast. taper     No current facility-administered medications for this visit.     ROS:   General:  No weight loss, Fever, chills  HEENT: No recent headaches, no nasal bleeding, no visual changes, no sore throat  Neurologic: No dizziness, blackouts, seizures. No recent symptoms of stroke or mini- stroke. No recent episodes of slurred speech, or temporary blindness.  Cardiac: No recent episodes of chest pain/pressure, no shortness of breath at rest.  No shortness of breath  with exertion.  Denies history of atrial fibrillation or irregular heartbeat  Vascular: No history of rest pain in feet.  No history of claudication.  No history of non-healing ulcer, No history of DVT   Pulmonary: No home oxygen, no productive cough, no hemoptysis,  No asthma or wheezing  Musculoskeletal:  [ ]  Arthritis, [ ]  Low back pain,  [ ]  Joint pain [x]  neck and upper back pain right die  Hematologic:No history of hypercoagulable state.  No history of easy bleeding.  No history of anemia  Gastrointestinal: No hematochezia or melena,  No gastroesophageal reflux, no trouble swallowing  Urinary: [x ] chronic Kidney disease, [ ]  on HD - [ ]  MWF or [ ]  TTHS, [ ]  Burning with urination, [ ]  Frequent urination, [ ]  Difficulty urinating;   Skin: No rashes  Psychological: No history of anxiety,  No history of depression   Physical Examination  Vitals:   04/11/16 1334 04/11/16 1340  BP: (!) 180/81 (!) 177/88  Pulse: 74   Resp: 16   Temp: 97.9 F (36.6 C)   TempSrc: Oral   SpO2: 95%   Weight: 155 lb (70.3 kg)   Height: 5\' 8"  (1.727 m)     Body mass index is 23.57 kg/m.  General:  Alert and oriented, no acute distress HEENT: Normal Neck: No bruit or JVD Pulmonary: Clear to auscultation bilaterally Cardiac: Regular Rate and Rhythm without murmur Gastrointestinal: Soft, non-tender, non-distended, no mass, no scars Skin: No rash Extremity Pulses:  2+ radial, brachial, femoral, dorsalis pedis, posterior tibial pulses bilaterally Musculoskeletal: cervical neck flexor contracture unable to return to midline deformity or edema  Neurologic: Upper and lower extremity motor 5/5 and symmetric  DATA:  CTA  Vascular: Atherosclerosis at both carotid bifurcations. Severe stenosis of the right ICA at the distal bulb, stenosis possibly 70% or greater. Carotid duplex 04/11/2016 Bilateral carotid ICA < 40% stenosis   ASSESSMENT:  Asymptomatic right carotid stenosis Cervical pain  right   PLAN: He has no history of sign or symptoms of stroke or TIA. The CTA reports < 70% right ICA stenosis Duplex performed today shows Bilateral carotid ICA < 40% stenosis. He will follow up in 6 month for repeat carotid duplex.  Dr. Darrick Penna discussed the signs and symptoms of stroke and TIA today with the patient and his wife.  If he has symptoms he will call.  Thomasena Edis, EMMA MAUREEN PA-C [facility]  The patient was seen in conjunction with Dr. Darrick Penna today   History and exam findings as above. Patient has an asymptomatic carotid stenosis. I reviewed the CT angiogram which had significant motion artifact and calcification of the lesion which tends to overestimate level of stenosis. Carotid  duplex in our office today revealed less than 40% stenosis bilaterally. On my review the CT scan the stenosis is probably around 50% but again overestimated secondary to calcification. Since the patient is asymptomatic I would not consider intervention at this level of stenosis. He will follow-up with [place] in 6 months time. If his carotid duplex progresses to more than 80% or he develops symptoms we will consider an intervention at that point. Patient has severe stiffness of his neck with degenerative joint disease. If he requires intervention at some point in the future this may require carotid stenting rather than carotid endarterectomy due to the immobility of his neck.  Fabienne Bruns, MD [facility] [place] Office: [phone] Pager: [phone]

## 2016-04-16 ENCOUNTER — Other Ambulatory Visit: Payer: Self-pay | Admitting: *Deleted

## 2016-04-16 DIAGNOSIS — I6523 Occlusion and stenosis of bilateral carotid arteries: Secondary | ICD-10-CM

## 2016-04-20 NOTE — ED Provider Notes (Signed)
[facility] Provider Note   CSN: [id number] Arrival date & time: 04/05/16  1148  First Provider Contact:  First MD Initiated Contact with Patient 04/05/16 1204        History   Chief Complaint Chief Complaint  Patient presents with  . Shoulder Pain    HPI Edward Suarez is a 82 y.o. male.  HPI Patient presents with left-sided upper back, neck and shoulder pain after working out. Pain is worsened with movement. No definite trauma. States does have some tingling in the posterior left arm. No weakness. No chest pain or shortness of breath. No lower extremity swelling or pain. Past Medical History:  Diagnosis Date  . Allergic rhinitis, cause unspecified   . BPH (benign prostatic hyperplasia)   . CAD (coronary artery disease)   . Carotid artery occlusion   . Cataracts, bilateral   . Chronic kidney disease (CKD), stage III (moderate)   . Complete heart block (HCC) 01/23/04   s/p Medtronic PPM implanted by Dr Amil Amen  . COPD (chronic obstructive pulmonary disease) (HCC)   . Diabetes mellitus   . Glaucoma   . Hyperlipidemia   . Hypertension   . 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 Kidney stones   . PAF (paroxysmal atrial fibrillation) (HCC) 10/09/2014   Noted on pacer check with 88 mode switches and longest episode >1 hour.  Now on Apixiban for CHADS2VASC score of 5  . Paroxysmal atrial fibrillation (HCC)    detected on PPM interrogation    Patient Active Problem List   Diagnosis Date Noted  . 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 Cervicalgia 10/19/2015  . Cardiac pacemaker in situ 10/19/2015  . Chronic renal insufficiency, stage III (moderate) 11/21/2014  . PAF (paroxysmal atrial fibrillation) (HCC) 10/09/2014  . 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 Allergic rhinitis 08/10/2014  . Hypercholesterolemia without hypertriglyceridemia 08/10/2014  . Snoring 06/15/2014  . Memory loss, short term 06/15/2014    . MCI (mild cognitive impairment) with memory loss 06/15/2014  . 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 PVC (premature ventricular contraction) 05/05/2014  . 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 Amnesia 05/04/2014  . Pacemaker-Medtronic 04/17/2012  . Hypertension 04/15/2012  . CAD (coronary artery disease) 04/15/2012  . Atrioventricular block, complete (HCC) 04/13/2012  . Carotid stenosis, bilateral 02/07/2012    Past Surgical History:  Procedure Laterality Date  . PACEMAKER INSERTION  2005, 04/16/12   MDT implanted by Dr Amil Amen with generator chnage (MDT Adapta L) by Dr Johney Frame 04/16/12  . PERMANENT PACEMAKER GENERATOR CHANGE N/A 04/16/2012   Procedure: PERMANENT PACEMAKER GENERATOR CHANGE;  Surgeon: Hillis Range, MD;  Location: [facility] CATH LAB;  Service: Cardiovascular;  Laterality: N/A;       Home Medications    Prior to Admission medications   Medication Sig Start Date End Date Taking? Authorizing Provider  amLODipine (NORVASC) 5 MG tablet TAKE 1 TABLET (5 MG TOTAL) BY MOUTH DAILY. 11/24/15   Quintella Reichert, MD  atorvastatin (LIPITOR) 40 MG tablet Take 40 mg by mouth every evening.     Historical Provider, MD  cetirizine (ZYRTEC) 10 MG tablet Take 10 mg by mouth daily. For allergies    Historical Provider, MD  diazepam (VALIUM) 5 MG tablet Take 1 tablet (5 mg total) by mouth every 8 (eight) hours as needed (muscle spasm or pain). 04/06/16   Trixie Dredge, PA-C  ELIQUIS 2.5 MG TABS  tablet TAKE 1 TABLET (2.5 MG TOTAL) BY MOUTH 2 (TWO) TIMES DAILY. 11/24/15   Quintella Reichert, MD  fluticasone (FLONASE) 50 MCG/ACT nasal spray Place 2 sprays into both nostrils daily as needed for allergies or rhinitis.    Historical Provider, MD  glipiZIDE (GLUCOTROL) 5 MG tablet Take 5 mg by mouth daily before breakfast.      Historical Provider, MD  HYDROcodone-acetaminophen (NORCO/VICODIN) 5-325 MG tablet Take 1-2 tablets by mouth once.  04/05/16   Loren Racer, MD  methocarbamol (ROBAXIN) 500 MG tablet Take 1 tablet (500 mg total) by mouth every 8 (eight) hours as needed for muscle spasms. 04/05/16   Loren Racer, MD  pioglitazone (ACTOS) 45 MG tablet Take 45 mg by mouth daily.  11/24/15   Historical Provider, MD  predniSONE (DELTASONE) 10 MG tablet Take 10 mg by mouth daily with breakfast. taper    Historical Provider, MD    Family History Family History  Problem Relation Age of Onset  . Heart disease Father   . Heart attack Father     Social History Social History  Substance Use Topics  . Smoking status: Former Smoker    Types: Cigarettes    Quit date: 09/16/1998  . Smokeless tobacco: Never Used  . Alcohol use 3.0 oz/week    2 Glasses of wine, 3 Shots of liquor per week     Comment: drinks a tequila shot daily     Allergies   Penicillins   Review of Systems Review of Systems  Constitutional: Negative for chills, fatigue and fever.  Respiratory: Negative for cough and shortness of breath.   Cardiovascular: Negative for chest pain and palpitations.  Gastrointestinal: Negative for abdominal pain, nausea and vomiting.  Musculoskeletal: Positive for back pain, myalgias and neck pain. Negative for arthralgias and neck stiffness.  Skin: Negative for color change, rash and wound.  Neurological: Positive for numbness (tingling to the left upper extremity). Negative for dizziness, weakness, light-headedness and headaches.  All other systems reviewed and are negative.    Physical Exam Updated Vital Signs BP 136/65   Pulse 74   Resp 15   Wt 156 lb (70.8 kg)   SpO2 98%   BMI 23.72 kg/m   Physical Exam  Constitutional: He is oriented to person, place, and time. He appears well-developed and well-nourished. No distress.  HENT:  Head: Normocephalic and atraumatic.  Mouth/Throat: Oropharynx is clear and moist.  Eyes: EOM are normal. Pupils are equal, round, and reactive to light.  Neck: Normal range of  motion. Neck supple.  No meningismus. No midline cervical tenderness to palpation. Patient does have some left-sided trapezius and left upper thoracic back muscular tenderness to palpation.  Cardiovascular: Normal rate and regular rhythm.   Pulmonary/Chest: Effort normal and breath sounds normal.  Abdominal: Soft. Bowel sounds are normal. There is no tenderness. There is no rebound and no guarding.  Musculoskeletal: Normal range of motion. He exhibits no edema, tenderness or deformity.  Full range of motion. Shoulder without deformity or tenderness to palpation. 2+ bilateral radial pulses. No asymmetry to the upper extremities.  Neurological: He is alert and oriented to person, place, and time.  5/5 motor in bilateral upper extremities with equal grip strength. Sensation intact. Normal gait.  Skin: Skin is warm and dry. No rash noted. He is not diaphoretic. No erythema.  Psychiatric: He has a normal mood and affect. His behavior is normal.  Nursing note and vitals reviewed.    ED Treatments / Results  Labs (all labs ordered are listed, but only abnormal results are displayed) Labs Reviewed  CBC WITH DIFFERENTIAL/PLATELET - Abnormal; Notable for the following:       Result Value   RDW 17.0 (*)    All other components within normal limits  COMPREHENSIVE METABOLIC PANEL - Abnormal; Notable for the following:    Glucose, Bld 171 (*)    BUN 25 (*)    Creatinine, Ser 1.46 (*)    GFR calc non Af Amer 43 (*)    GFR calc Af Amer 50 (*)    All other components within normal limits  I-STAT TROPOININ, ED    EKG  EKG Interpretation  Date/Time:  Friday April 05 2016 12:02:59 EDT Ventricular Rate:  77 PR Interval:    QRS Duration: 176 QT Interval:  476 QTC Calculation: 538 R Axis:   -92 Text Interpretation:  AV dual-paced rhythm Confirmed by [facility]  MD, Nicholos Johns ([id number]) on 04/06/2016 9:24:56 PM       Radiology No results found.  Procedures Procedures (including critical care  time)  Medications Ordered in ED Medications  methocarbamol (ROBAXIN) tablet 500 mg (500 mg Oral Given 04/05/16 1225)  HYDROcodone-acetaminophen (NORCO/VICODIN) 5-325 MG per tablet 1 tablet (1 tablet Oral Given 04/05/16 1225)  HYDROmorphone (DILAUDID) injection 0.5 mg (0.5 mg Intramuscular Given 04/05/16 1439)     Initial Impression / Assessment and Plan / ED Course  I have reviewed the triage vital signs and the nursing notes.  Pertinent labs & imaging results that were available during my care of the patient were reviewed by me and considered in my medical decision making (see chart for details).  Clinical Course    Patient presents with muscle skeletal pain worse after working out. Normal neurologic exam. We'll treat symptomatically and advised follow-up with his primary physician.  Final Clinical Impressions(s) / ED Diagnoses   Final diagnoses:  Cervicalgia    New Prescriptions Discharge Medication List as of 04/05/2016  2:55 PM    START taking these medications   Details  HYDROcodone-acetaminophen (NORCO/VICODIN) 5-325 MG tablet Take 1-2 tablets by mouth once., Starting 04/05/2016, Print    methocarbamol (ROBAXIN) 500 MG tablet Take 1 tablet (500 mg total) by mouth every 8 (eight) hours as needed for muscle spasms., Starting 04/05/2016, Until Discontinued, Print         Loren Racer, MD 04/20/16 [phone]

## 2016-04-25 ENCOUNTER — Encounter: Payer: Self-pay | Admitting: Cardiology

## 2016-04-29 DIAGNOSIS — M542 Cervicalgia: Secondary | ICD-10-CM | POA: Diagnosis not present

## 2016-05-05 ENCOUNTER — Encounter: Payer: Self-pay | Admitting: Cardiology

## 2016-05-05 NOTE — Progress Notes (Signed)
Cardiology Office Note    Date:  05/09/2016   ID:  Edward RoyalSheldon I Suarez, DOB 12-06-1931, MRN [id number]  PCP:  Katy ApoPOLITE,RONALD D, MD  Cardiologist:  Armanda Magicraci Turner, MD   Chief Complaint  Patient presents with  . Atrial Fibrillation  . Hypertension    History of Present Illness:  Edward Suarez is a 82 y.o. male with a history of HTN, DM, dyslipidemia, Complete HB s/p PPM and PVC's who presents today for followup. He was recently diagnosed with < 50% carotid artery stenosis and is followed by Dr. Darrick PennaFields.  He is doing well. He denies any chest pain, SOB, DOE, LE edema, dizziness, palpitations or syncope.   Past Medical History:  Diagnosis Date  . Allergic rhinitis, cause unspecified   . BPH (benign prostatic hyperplasia)   . Carotid artery occlusion   . Cataracts, bilateral   . Chronic kidney disease (CKD), stage III (moderate)   . Complete heart block (HCC) 01/23/04   s/p Medtronic PPM implanted by Dr Amil AmenEdmunds  . COPD (chronic obstructive pulmonary disease) (HCC)   . Diabetes mellitus   . Glaucoma   . Hyperlipidemia   . Hypertension   . 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 Kidney stones   . PAF (paroxysmal atrial fibrillation) (HCC) 10/09/2014   Noted on pacer check with 88 mode switches and longest episode >1 hour.  Now on Apixiban for CHADS2VASC score of 5    Past Surgical History:  Procedure Laterality Date  . PACEMAKER INSERTION  2005, 04/16/12   MDT implanted by Dr Amil AmenEdmunds with generator chnage (MDT Adapta L) by Dr Johney FrameAllred 04/16/12  . PERMANENT PACEMAKER GENERATOR CHANGE N/A 04/16/2012   Procedure: PERMANENT PACEMAKER GENERATOR CHANGE;  Surgeon: Hillis RangeJames Allred, MD;  [facility];  Service: Cardiovascular;  Laterality: N/A;    Current Medications: Outpatient Medications Prior to Visit  Medication Sig Dispense Refill  . amLODipine (NORVASC) 5 MG tablet TAKE 1 TABLET (5 MG TOTAL) BY MOUTH DAILY. 180 tablet 1  . atorvastatin (LIPITOR) 40 MG tablet Take 40 mg by  mouth every evening.     . cetirizine (ZYRTEC) 10 MG tablet Take 10 mg by mouth daily. For allergies    . diazepam (VALIUM) 5 MG tablet Take 1 tablet (5 mg total) by mouth every 8 (eight) hours as needed (muscle spasm or pain). 15 tablet 0  . ELIQUIS 2.5 MG TABS tablet TAKE 1 TABLET (2.5 MG TOTAL) BY MOUTH 2 (TWO) TIMES DAILY. 60 tablet 5  . fluticasone (FLONASE) 50 MCG/ACT nasal spray Place 2 sprays into both nostrils daily as needed for allergies or rhinitis.    [name] Kitchen. glipiZIDE (GLUCOTROL) 5 MG tablet Take 5 mg by mouth daily before breakfast.      . HYDROcodone-acetaminophen (NORCO/VICODIN) 5-325 MG tablet Take 1-2 tablets by mouth once. 10 tablet 0  . methocarbamol (ROBAXIN) 500 MG tablet Take 1 tablet (500 mg total) by mouth every 8 (eight) hours as needed for muscle spasms. 30 tablet 0  . pioglitazone (ACTOS) 45 MG tablet Take 45 mg by mouth daily.     . predniSONE (DELTASONE) 10 MG tablet Take 10 mg by mouth daily with breakfast. taper     No facility-administered medications prior to visit.      Allergies:   Penicillins   Social History   Social History  . Marital status: Married    Spouse name: N/A  . Number of children: 2  . Years of education: BS   Social  History Main Topics  . Smoking status: Former Smoker    Types: Cigarettes    Quit date: 09/16/1998  . Smokeless tobacco: Never Used  . Alcohol use 3.0 oz/week    2 Glasses of wine, 3 Shots of liquor per week     Comment: drinks a tequila shot daily  . Drug use: No  . Sexual activity: Not Asked   Other Topics Concern  . 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  [place]   Patient is married with 2 children.   Patient is right handed.   Patient has BS degree.   Patient drinks 1 cup daily.           Family History:  The patient's family history includes Heart attack in his father; Heart disease in his father.   ROS:   Please see the history of present illness.    ROS All other  systems reviewed and are negative.   PHYSICAL EXAM:   VS:  BP (!) 142/82   Pulse 88   Ht 5\' 8"  (1.727 m)   Wt 156 lb (70.8 kg)   BMI 23.72 kg/m    GEN: Well nourished, well developed, in no acute distress  HEENT: normal  Neck: no JVD, carotid bruits, or masses Cardiac: RRR; no murmurs, rubs, or gallops,no edema.  Intact distal pulses bilaterally.  Respiratory:  clear to auscultation bilaterally, normal work of breathing GI: soft, nontender, nondistended, + BS MS: no deformity or atrophy  Skin: warm and dry, no rash Neuro:  Alert and Oriented x 3, Strength and sensation are intact Psych: euthymic mood, full affect  Wt Readings from Last 3 Encounters:  05/09/16 156 lb (70.8 kg)  04/11/16 155 lb (70.3 kg)  04/05/16 156 lb (70.8 kg)      Studies/Labs Reviewed:   EKG:  EKG is not ordered today.    Recent Labs: 04/05/2016: ALT 20 04/06/2016: BUN 19; Creatinine, Ser 1.19; Hemoglobin 13.6; Platelets 227; Potassium 3.7; Sodium 133   Lipid Panel No results found for: CHOL, TRIG, HDL, CHOLHDL, VLDL, LDLCALC, LDLDIRECT  Additional studies/ records that were reviewed today include:  none    ASSESSMENT:    1. Atrioventricular block, complete (HCC)   2. Coronary artery disease due to lipid rich plaque   3. Carotid stenosis, bilateral   4. Essential hypertension   5. PAF (paroxysmal atrial fibrillation) (HCC)      PLAN:  In order of problems listed above:  1. Complete Heart block s/p PPM followed in device clinic. 2. ASCAD 3.   Bilateral carotid artery stenosis (< 50% )- continue statin.  Not on ASA due to NOAC.  Following with Dr. Darrick PennaFields.  Reassured patient and his wife that he does not have severe carotid stenosis and dopplers confirmed < 50% stenosis.  He needs aggressive risk factor modification.  I will get a copy of FLP and HbA1C from PCP. 3. HTN - BP controlled on current meds. Continue amlodipine. 4. PAF - maintaining NSR. CHADS2VASC score of 5.  Continue Apixaban  but increase dose to 5mg  BID (age 40>80 but Creatinine < 1.5 and weight > 60kg)   5.  Hyperlipidemia - continue statin.  LDL goal < 70. Get copy of FLp and ALT from PCP.  Medication Adjustments/Labs and Tests Ordered: Current medicines are reviewed at length with the patient today.  Concerns regarding medicines are outlined above.  Medication changes, Labs and Tests ordered today are listed in the Patient  Instructions below.  There are no Patient Instructions on file for this visit.   Signed, Armanda Magic, MD  05/09/2016 9:49 AM    [facility] [address] [place], [place], [place]  [address] Phone: [phone]; Fax: [phone]

## 2016-05-06 DIAGNOSIS — E0520 Thyrotoxicosis with toxic multinodular goiter without thyrotoxic crisis or storm: Secondary | ICD-10-CM | POA: Diagnosis not present

## 2016-05-09 ENCOUNTER — Encounter: Payer: Self-pay | Admitting: Cardiology

## 2016-05-09 ENCOUNTER — Encounter (INDEPENDENT_AMBULATORY_CARE_PROVIDER_SITE_OTHER): Payer: Self-pay

## 2016-05-09 ENCOUNTER — Ambulatory Visit (INDEPENDENT_AMBULATORY_CARE_PROVIDER_SITE_OTHER): Payer: PPO | Admitting: Cardiology

## 2016-05-09 VITALS — BP 142/82 | HR 88 | Ht 68.0 in | Wt 156.0 lb

## 2016-05-09 DIAGNOSIS — I442 Atrioventricular block, complete: Secondary | ICD-10-CM

## 2016-05-09 DIAGNOSIS — I6523 Occlusion and stenosis of bilateral carotid arteries: Secondary | ICD-10-CM | POA: Diagnosis not present

## 2016-05-09 DIAGNOSIS — I10 Essential (primary) hypertension: Secondary | ICD-10-CM | POA: Diagnosis not present

## 2016-05-09 DIAGNOSIS — I480 Paroxysmal atrial fibrillation: Secondary | ICD-10-CM

## 2016-05-09 DIAGNOSIS — I2510 Atherosclerotic heart disease of native coronary artery without angina pectoris: Secondary | ICD-10-CM

## 2016-05-09 DIAGNOSIS — I2583 Coronary atherosclerosis due to lipid rich plaque: Secondary | ICD-10-CM

## 2016-05-09 MED ORDER — APIXABAN 5 MG PO TABS: 5.0000 mg | ORAL_TABLET | Freq: Two times a day (BID) | ORAL | 11 refills | Status: DC

## 2016-05-09 NOTE — Patient Instructions (Signed)
Medication Instructions:  1) INCREASE ELIQUIS to 5 mg TWICE DAILY  Labwork: None  Testing/Procedures: None  Follow-Up: Your physician wants you to follow-up in: 6 months with Dr. Mayford Knifeurner. You will receive a reminder letter in the mail two months in advance. If you don't receive a letter, please call our office to schedule the follow-up appointment.   Any Other Special Instructions Will Be Listed Below (If Applicable).     If you need a refill on your cardiac medications before your next appointment, please call your pharmacy.

## 2016-05-22 DIAGNOSIS — M542 Cervicalgia: Secondary | ICD-10-CM | POA: Diagnosis not present

## 2016-05-24 ENCOUNTER — Telehealth: Payer: Self-pay

## 2016-05-24 DIAGNOSIS — E785 Hyperlipidemia, unspecified: Secondary | ICD-10-CM

## 2016-05-24 MED ORDER — ATORVASTATIN CALCIUM 80 MG PO TABS: 80.0000 mg | ORAL_TABLET | Freq: Every evening | ORAL | 11 refills | Status: DC

## 2016-05-24 NOTE — Telephone Encounter (Signed)
-----   Message from Quintella Reichertraci R Turner, MD sent at 05/21/2016  6:05 PM EDT ----- LDL not at goal - please increase lipitor to 80mg  daily and repeat FLP and ALT in 6 weeks

## 2016-05-24 NOTE — Telephone Encounter (Signed)
Informed patient of results and verbal understanding expressed.  Instructed patient to INCREASE LIPITOR to 80 mg daily. FLP and ALT scheduled 10/23. Patient agrees with treatment plan.

## 2016-05-28 DIAGNOSIS — M542 Cervicalgia: Secondary | ICD-10-CM | POA: Diagnosis not present

## 2016-05-31 DIAGNOSIS — M542 Cervicalgia: Secondary | ICD-10-CM | POA: Diagnosis not present

## 2016-06-03 ENCOUNTER — Ambulatory Visit (INDEPENDENT_AMBULATORY_CARE_PROVIDER_SITE_OTHER): Payer: PPO | Admitting: *Deleted

## 2016-06-03 DIAGNOSIS — I442 Atrioventricular block, complete: Secondary | ICD-10-CM | POA: Diagnosis not present

## 2016-06-03 NOTE — Progress Notes (Signed)
Remote pacemaker transmission.   

## 2016-06-04 DIAGNOSIS — M542 Cervicalgia: Secondary | ICD-10-CM | POA: Diagnosis not present

## 2016-06-05 ENCOUNTER — Encounter: Payer: Self-pay | Admitting: Cardiology

## 2016-06-11 DIAGNOSIS — M542 Cervicalgia: Secondary | ICD-10-CM | POA: Diagnosis not present

## 2016-06-14 ENCOUNTER — Other Ambulatory Visit: Payer: Self-pay | Admitting: Cardiology

## 2016-06-14 DIAGNOSIS — M542 Cervicalgia: Secondary | ICD-10-CM | POA: Diagnosis not present

## 2016-06-17 DIAGNOSIS — M542 Cervicalgia: Secondary | ICD-10-CM | POA: Diagnosis not present

## 2016-06-20 DIAGNOSIS — H26491 Other secondary cataract, right eye: Secondary | ICD-10-CM | POA: Diagnosis not present

## 2016-06-21 DIAGNOSIS — M542 Cervicalgia: Secondary | ICD-10-CM | POA: Diagnosis not present

## 2016-06-21 LAB — CUP PACEART REMOTE DEVICE CHECK
Battery Impedance: 379 Ohm
Battery Remaining Longevity: 74 mo
Battery Voltage: 2.78 V
Brady Statistic AP VP Percent: 79 %
Brady Statistic AP VS Percent: 0 %
Brady Statistic AS VP Percent: 20 %
Brady Statistic AS VS Percent: 0 %
Date Time Interrogation Session: 20170918113509
Implantable Lead Implant Date: 20050509
Implantable Lead Implant Date: 20050509
Implantable Lead Location: 753859
Implantable Lead Location: 753860
Implantable Lead Model: 5076
Implantable Lead Model: 5092
Lead Channel Impedance Value: 404 Ohm
Lead Channel Impedance Value: 534 Ohm
Lead Channel Pacing Threshold Amplitude: 0.625 V
Lead Channel Pacing Threshold Amplitude: 0.875 V
Lead Channel Pacing Threshold Pulse Width: 0.4 ms
Lead Channel Pacing Threshold Pulse Width: 0.4 ms
Lead Channel Sensing Intrinsic Amplitude: 2.8 mV
Lead Channel Setting Pacing Amplitude: 2 V
Lead Channel Setting Pacing Amplitude: 3 V
Lead Channel Setting Pacing Pulse Width: 0.4 ms
Lead Channel Setting Sensing Sensitivity: 4 mV

## 2016-06-24 DIAGNOSIS — M542 Cervicalgia: Secondary | ICD-10-CM | POA: Diagnosis not present

## 2016-06-28 DIAGNOSIS — M542 Cervicalgia: Secondary | ICD-10-CM | POA: Diagnosis not present

## 2016-07-02 DIAGNOSIS — M542 Cervicalgia: Secondary | ICD-10-CM | POA: Diagnosis not present

## 2016-07-08 ENCOUNTER — Other Ambulatory Visit: Payer: PPO | Admitting: *Deleted

## 2016-07-08 DIAGNOSIS — E785 Hyperlipidemia, unspecified: Secondary | ICD-10-CM | POA: Diagnosis not present

## 2016-07-08 LAB — LIPID PANEL
Cholesterol: 160 mg/dL (ref 125–200)
HDL: 60 mg/dL (ref >=40)
LDL Cholesterol: 78 mg/dL (ref <130)
Total CHOL/HDL Ratio: 2.7 Ratio (ref <=5.0)
Triglycerides: 111 mg/dL (ref <150)
VLDL: 22 mg/dL (ref <30)

## 2016-07-08 LAB — ALT: ALT: 16 U/L (ref 9–46)

## 2016-08-01 ENCOUNTER — Other Ambulatory Visit: Payer: Self-pay | Admitting: *Deleted

## 2016-08-01 DIAGNOSIS — I6523 Occlusion and stenosis of bilateral carotid arteries: Secondary | ICD-10-CM

## 2016-08-13 DIAGNOSIS — E0520 Thyrotoxicosis with toxic multinodular goiter without thyrotoxic crisis or storm: Secondary | ICD-10-CM | POA: Diagnosis not present

## 2016-09-02 ENCOUNTER — Ambulatory Visit (INDEPENDENT_AMBULATORY_CARE_PROVIDER_SITE_OTHER): Payer: PPO | Admitting: *Deleted

## 2016-09-02 ENCOUNTER — Telehealth: Payer: Self-pay | Admitting: Cardiology

## 2016-09-02 DIAGNOSIS — I442 Atrioventricular block, complete: Secondary | ICD-10-CM

## 2016-09-02 NOTE — Progress Notes (Signed)
Remote pacemaker transmission.   

## 2016-09-02 NOTE — Telephone Encounter (Signed)
Spoke with pt and reminded pt of remote transmission that is due today. Pt verbalized understanding.   

## 2016-09-03 LAB — CUP PACEART REMOTE DEVICE CHECK
Battery Impedance: 428 Ohm
Battery Remaining Longevity: 75 mo
Battery Voltage: 2.78 V
Brady Statistic AP VP Percent: 81 %
Brady Statistic AP VS Percent: 0 %
Brady Statistic AS VP Percent: 19 %
Brady Statistic AS VS Percent: 0 %
Date Time Interrogation Session: 20171218193631
Implantable Lead Implant Date: 20050509
Implantable Lead Implant Date: 20050509
Implantable Lead Location: 753859
Implantable Lead Location: 753860
Implantable Lead Model: 5076
Implantable Lead Model: 5092
Implantable Pulse Generator Implant Date: 20130801
Lead Channel Impedance Value: 398 Ohm
Lead Channel Impedance Value: 615 Ohm
Lead Channel Pacing Threshold Amplitude: 0.625 V
Lead Channel Pacing Threshold Amplitude: 1.125 V
Lead Channel Pacing Threshold Pulse Width: 0.4 ms
Lead Channel Pacing Threshold Pulse Width: 0.4 ms
Lead Channel Setting Pacing Amplitude: 2 V
Lead Channel Setting Pacing Amplitude: 2.75 V
Lead Channel Setting Pacing Pulse Width: 0.4 ms
Lead Channel Setting Sensing Sensitivity: 4 mV

## 2016-09-04 ENCOUNTER — Encounter: Payer: Self-pay | Admitting: Cardiology

## 2016-10-10 DIAGNOSIS — I6521 Occlusion and stenosis of right carotid artery: Secondary | ICD-10-CM | POA: Diagnosis not present

## 2016-10-10 DIAGNOSIS — Z7984 Long term (current) use of oral hypoglycemic drugs: Secondary | ICD-10-CM | POA: Diagnosis not present

## 2016-10-10 DIAGNOSIS — F09 Unspecified mental disorder due to known physiological condition: Secondary | ICD-10-CM | POA: Diagnosis not present

## 2016-10-10 DIAGNOSIS — Z1389 Encounter for screening for other disorder: Secondary | ICD-10-CM | POA: Diagnosis not present

## 2016-10-10 DIAGNOSIS — E1122 Type 2 diabetes mellitus with diabetic chronic kidney disease: Secondary | ICD-10-CM | POA: Diagnosis not present

## 2016-10-10 DIAGNOSIS — N183 Chronic kidney disease, stage 3 (moderate): Secondary | ICD-10-CM | POA: Diagnosis not present

## 2016-10-10 DIAGNOSIS — Z0000 Encounter for general adult medical examination without abnormal findings: Secondary | ICD-10-CM | POA: Diagnosis not present

## 2016-10-10 DIAGNOSIS — E7800 Pure hypercholesterolemia, unspecified: Secondary | ICD-10-CM | POA: Diagnosis not present

## 2016-10-10 DIAGNOSIS — E0590 Thyrotoxicosis, unspecified without thyrotoxic crisis or storm: Secondary | ICD-10-CM | POA: Diagnosis not present

## 2016-10-14 ENCOUNTER — Encounter: Payer: Self-pay | Admitting: Family

## 2016-10-16 ENCOUNTER — Ambulatory Visit (INDEPENDENT_AMBULATORY_CARE_PROVIDER_SITE_OTHER): Payer: PPO | Admitting: Family

## 2016-10-16 ENCOUNTER — Encounter: Payer: Self-pay | Admitting: Family

## 2016-10-16 ENCOUNTER — Ambulatory Visit (HOSPITAL_COMMUNITY)
Admission: RE | Admit: 2016-10-16 | Discharge: 2016-10-16 | Disposition: A | Discharge status: Home / Self Care | Payer: PPO | Source: Ambulatory Visit | Attending: Family | Admitting: Family

## 2016-10-16 VITALS — BP 137/76 | HR 78 | Temp 97.3°F | Resp 18 | Ht 68.0 in | Wt 154.4 lb

## 2016-10-16 DIAGNOSIS — I6523 Occlusion and stenosis of bilateral carotid arteries: Secondary | ICD-10-CM | POA: Diagnosis not present

## 2016-10-16 LAB — VAS US CAROTID
LEFT ECA DIAS: -4 cm/s
Left CCA dist dias: 8 cm/s
Left CCA dist sys: 62 cm/s
Left CCA prox dias: 13 cm/s
Left CCA prox sys: 102 cm/s
Left ICA prox dias: -8 cm/s
Left ICA prox sys: -42 cm/s
RIGHT CCA MID DIAS: 9 cm/s
RIGHT ECA DIAS: -4 cm/s
RIGHT VERTEBRAL DIAS: -11 cm/s
Right CCA prox dias: 7 cm/s
Right CCA prox sys: 86 cm/s

## 2016-10-16 NOTE — Patient Instructions (Signed)
Stroke Prevention Some medical conditions and behaviors are associated with an increased chance of having a stroke. You may prevent a stroke by making healthy choices and managing medical conditions. How can I reduce my risk of having a stroke?  Stay physically active. Get at least 30 minutes of activity on most or all days.  Do not smoke. It may also be helpful to avoid exposure to secondhand smoke.  Limit alcohol use. Moderate alcohol use is considered to be:  No more than 2 drinks per day for men.  No more than 1 drink per day for nonpregnant women.  Eat healthy foods. This involves:  Eating 5 or more servings of fruits and vegetables a day.  Making dietary changes that address high blood pressure (hypertension), high cholesterol, diabetes, or obesity.  Manage your cholesterol levels.  Making food choices that are high in fiber and low in saturated fat, trans fat, and cholesterol may control cholesterol levels.  Take any prescribed medicines to control cholesterol as directed by your health care provider.  Manage your diabetes.  Controlling your carbohydrate and sugar intake is recommended to manage diabetes.  Take any prescribed medicines to control diabetes as directed by your health care provider.  Control your hypertension.  Making food choices that are low in salt (sodium), saturated fat, trans fat, and cholesterol is recommended to manage hypertension.  Ask your health care provider if you need treatment to lower your blood pressure. Take any prescribed medicines to control hypertension as directed by your health care provider.  If you are 18-39 years of age, have your blood pressure checked every 3-5 years. If you are 40 years of age or older, have your blood pressure checked every year.  Maintain a healthy weight.  Reducing calorie intake and making food choices that are low in sodium, saturated fat, trans fat, and cholesterol are recommended to manage  weight.  Stop drug abuse.  Avoid taking birth control pills.  Talk to your health care provider about the risks of taking birth control pills if you are over 35 years old, smoke, get migraines, or have ever had a blood clot.  Get evaluated for sleep disorders (sleep apnea).  Talk to your health care provider about getting a sleep evaluation if you snore a lot or have excessive sleepiness.  Take medicines only as directed by your health care provider.  For some people, aspirin or blood thinners (anticoagulants) are helpful in reducing the risk of forming abnormal blood clots that can lead to stroke. If you have the irregular heart rhythm of atrial fibrillation, you should be on a blood thinner unless there is a good reason you cannot take them.  Understand all your medicine instructions.  Make sure that other conditions (such as anemia or atherosclerosis) are addressed. Get help right away if:  You have sudden weakness or numbness of the face, arm, or leg, especially on one side of the body.  Your face or eyelid droops to one side.  You have sudden confusion.  You have trouble speaking (aphasia) or understanding.  You have sudden trouble seeing in one or both eyes.  You have sudden trouble walking.  You have dizziness.  You have a loss of balance or coordination.  You have a sudden, severe headache with no known cause.  You have new chest pain or an irregular heartbeat. Any of these symptoms may represent a serious problem that is an emergency. Do not wait to see if the symptoms will go away.   Get medical help at once. Call your local emergency services (911 in [place] Do not drive yourself to the hospital. This information is not intended to replace advice given to you by your health care provider. Make sure you discuss any questions you have with your health care provider. Document Released: 10/10/2004 Document Revised: 02/08/2016 Document Reviewed: 03/05/2013 Elsevier  Interactive Patient Education  2017 [employer]  

## 2016-10-16 NOTE — Progress Notes (Signed)
Chief Complaint: Follow up Extracranial Carotid Artery Stenosis   History of Present Illness  Edward Suarez is a 83 y.o. male patient of Dr. Darrick Penna who presents for evaluation of carotid stenosis.    The patient is currently on Eliquis anticoagulation therapy.  The carotid stenosis was found on CTA of the neck secondary to sudden onset neck pain. Other medical problems include A fib, CHF, DM, hyperlipidemia managed with Lipitor, HTN managed with Amlodipine.  These are currently stable and followed by Dr. Mayford Knife for cardiology and Dr. Nehemiah Settle as his primary care physician.  Pt was last evaluated by Dr. Darrick Penna on 04-11-16. At that time patient had an asymptomatic carotid stenosis. Dr. Darrick Penna reviewed the CT angiogram which had significant motion artifact and calcification of the lesion which tends to overestimate level of stenosis. Carotid duplex in our office that day revealed less than 40% stenosis bilaterally. On Dr. Darrick Penna review of the CT scan, the stenosis is probably around 50% but again overestimated secondary to calcification. Since the patient is asymptomatic Dr. Darrick Penna indicated he would not consider intervention at this level of stenosis. Dr. Darrick Penna advised that the pt should follow-up with [place] in 6 months time. If his carotid duplex progresses to more than 80% or he develops symptoms we will consider an intervention at that point. Patient has severe stiffness of his neck with degenerative joint disease. If he requires intervention at some point in the future this may require carotid stenting rather than carotid endarterectomy due to the immobility of his neck.  He and his wife just moved to [facility] in [place].   He denies any known history of stroke or TIA. Specifically he denies a history of amaurosis fugax or monocular blindness, unilateral facial drooping, hemiplegia, or receptive or expressive aphasia.   Wife states she is noticing some subtle changes in his cognition.     Pt Diabetic: yes, states he thinks his last A1C was 7.? Pt smoker: former smoker, quit in 2000, started in his teens  Pt meds include: Statin : yes ASA: no Other anticoagulants/antiplatelets: Eliquis, possibly for paroxysmal atrial fib, prescribed by his cardiologist, Dr. Armanda Magic   Past Medical History:  Diagnosis Date  . Allergic rhinitis, cause unspecified   . BPH (benign prostatic hyperplasia)   . Carotid artery occlusion   . Cataracts, bilateral   . Chronic kidney disease (CKD), stage III (moderate)   . Complete heart block (HCC) 01/23/04   s/p Medtronic PPM implanted by Dr Amil Amen  . COPD (chronic obstructive pulmonary disease) (HCC)   . Diabetes mellitus   . Glaucoma   . Hyperlipidemia   . Hypertension   . 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 Kidney stones   . PAF (paroxysmal atrial fibrillation) (HCC) 10/09/2014   Noted on pacer check with 88 mode switches and longest episode >1 hour.  Now on Apixiban for CHADS2VASC score of 5    Social History Social History  Substance Use Topics  . Smoking status: Former Smoker    Types: Cigarettes    Quit date: 09/16/1998  . Smokeless tobacco: Never Used  . Alcohol use 3.0 oz/week    2 Glasses of wine, 3 Shots of liquor per week     Comment: drinks a tequila shot daily    Family History Family History  Problem Relation Age of Onset  . Heart disease Father   . Heart attack Father     Surgical History Past Surgical History:  Procedure Laterality Date  .  PACEMAKER INSERTION  2005, 04/16/12   MDT implanted by Dr Amil Amen with generator chnage (MDT Adapta L) by Dr Johney Frame 04/16/12  . PERMANENT PACEMAKER GENERATOR CHANGE N/A 04/16/2012   Procedure: PERMANENT PACEMAKER GENERATOR CHANGE;  Surgeon: Hillis Range, MD;  Location[facility] CATH LAB;  Service: Cardiovascular;  Laterality: N/A;    Allergies  Allergen Reactions  . Penicillins Rash    Current Outpatient Prescriptions  Medication Sig Dispense Refill  .  amLODipine (NORVASC) 5 MG tablet TAKE 1 TABLET (5 MG TOTAL) BY MOUTH DAILY. 90 tablet 3  . apixaban (ELIQUIS) 5 MG TABS tablet Take 1 tablet (5 mg total) by mouth 2 (two) times daily. 60 tablet 11  . atorvastatin (LIPITOR) 80 MG tablet Take 1 tablet (80 mg total) by mouth every evening. 30 tablet 11  . cetirizine (ZYRTEC) 10 MG tablet Take 10 mg by mouth daily. For allergies    . diazepam (VALIUM) 5 MG tablet Take 1 tablet (5 mg total) by mouth every 8 (eight) hours as needed (muscle spasm or pain). 15 tablet 0  . fluticasone (FLONASE) 50 MCG/ACT nasal spray Place 2 sprays into both nostrils daily as needed for allergies or rhinitis.    [name] Kitchen glipiZIDE (GLUCOTROL) 5 MG tablet Take 5 mg by mouth daily before breakfast.      . HYDROcodone-acetaminophen (NORCO/VICODIN) 5-325 MG tablet Take 1-2 tablets by mouth once. 10 tablet 0  . methocarbamol (ROBAXIN) 500 MG tablet Take 1 tablet (500 mg total) by mouth every 8 (eight) hours as needed for muscle spasms. 30 tablet 0  . pioglitazone (ACTOS) 45 MG tablet Take 45 mg by mouth daily.     . predniSONE (DELTASONE) 10 MG tablet Take 10 mg by mouth daily with breakfast. taper     No current facility-administered medications for this visit.     Review of Systems : See HPI for pertinent positives and negatives.  Physical Examination  Vitals:   10/16/16 0953 10/16/16 0954  BP: (!) 154/83 137/76  Pulse: 78   Resp: 18   Temp: 97.3 F (36.3 C)   TempSrc: Oral   SpO2: 97%   Weight: 154 lb 6.4 oz (70 kg)   Height: 5\' 8"  (1.727 m)    Body mass index is 23.48 kg/m.  General: WDWN male in NAD GAIT: normal Eyes: PERRLA Pulmonary:  Respirations are non-labored, good air movement, CTAB    Cardiac: regular rhythm, no detected murmur. Pacemaker palpated right upper chest.   VASCULAR EXAM Carotid Bruits Right Left   Negative Negative    Aorta is not palpable. Radial pulses are 2+ palpable and equal.                                                                                                                             LE Pulses Right Left       POPLITEAL  not palpable   not palpable       POSTERIOR TIBIAL  2+  palpable   not palpable        DORSALIS PEDIS      ANTERIOR TIBIAL not palpable  2+ palpable     Gastrointestinal: soft, nontender, BS WNL, no r/g, no palpable masses.  Musculoskeletal: no muscle atrophy/wasting. M/S 5/5 throughout, extremities without ischemic changes.  Neurologic: A&O X 3; Appropriate Affect, Speech is normal CN 2-12 intact, pain and light touch intact in extremities, Motor exam as listed above.     Assessment: Edward Suarez is a 83 y.o. male who has no history of stroke or TIA.   His atherosclerotic risk factors include DM, 50+ years smoking that ended in 2000, paroxysmal atrial fib for which he takes Eliquis, and CKD (about stage 3).  He takes a daily statin. He has a pacemaker, has a history of complete heart block.   DATA Today's carotid duplex suggests <40% bilateral ICA stenosis. Bilateral vertebral artery flow is antegrade.  Bilateral subclavian artery waveforms are normal.  No significant change since the last exam on 04-11-2016.    Plan: Follow-up in 2 years with Carotid Duplex scan.   I discussed in depth with the patient the nature of atherosclerosis, and emphasized the importance of maximal medical management including strict control of blood pressure, blood glucose, and lipid levels, obtaining regular exercise, and continued cessation of smoking.  The patient is aware that without maximal medical management the underlying atherosclerotic disease process will progress, limiting the benefit of any interventions. The patient was given information about stroke prevention and what symptoms should prompt the patient to seek immediate medical care. Thank you for allowing us to participate in this patient's care.  Charisse MarchSuzanne Mallika Sanmiguel, RN, MSN, FNP-C [facility]  [place] Office: [phone]  Clinic Physician: Fields/Dickson  10/16/16 9:57 AM

## 2016-10-22 NOTE — Addendum Note (Signed)
Addended by: Burton ApleyPETTY, Shacora Zynda A on: 10/22/2016 02:35 PM   Modules accepted: Orders

## 2016-10-29 DIAGNOSIS — Z23 Encounter for immunization: Secondary | ICD-10-CM | POA: Diagnosis not present

## 2016-10-29 DIAGNOSIS — L821 Other seborrheic keratosis: Secondary | ICD-10-CM | POA: Diagnosis not present

## 2016-11-06 ENCOUNTER — Ambulatory Visit (INDEPENDENT_AMBULATORY_CARE_PROVIDER_SITE_OTHER): Payer: PPO | Admitting: Cardiology

## 2016-11-06 ENCOUNTER — Encounter: Payer: Self-pay | Admitting: Cardiology

## 2016-11-06 VITALS — BP 138/80 | HR 84 | Ht 68.0 in | Wt 152.8 lb

## 2016-11-06 DIAGNOSIS — I481 Persistent atrial fibrillation: Secondary | ICD-10-CM | POA: Diagnosis not present

## 2016-11-06 DIAGNOSIS — I10 Essential (primary) hypertension: Secondary | ICD-10-CM

## 2016-11-06 DIAGNOSIS — I6523 Occlusion and stenosis of bilateral carotid arteries: Secondary | ICD-10-CM | POA: Diagnosis not present

## 2016-11-06 DIAGNOSIS — I493 Ventricular premature depolarization: Secondary | ICD-10-CM

## 2016-11-06 DIAGNOSIS — I480 Paroxysmal atrial fibrillation: Secondary | ICD-10-CM | POA: Insufficient documentation

## 2016-11-06 DIAGNOSIS — E7800 Pure hypercholesterolemia, unspecified: Secondary | ICD-10-CM

## 2016-11-06 DIAGNOSIS — I442 Atrioventricular block, complete: Secondary | ICD-10-CM | POA: Diagnosis not present

## 2016-11-06 DIAGNOSIS — I4819 Other persistent atrial fibrillation: Secondary | ICD-10-CM

## 2016-11-06 LAB — CBC WITH DIFFERENTIAL/PLATELET
Basophils Absolute: 0 10*3/uL (ref 0.0–0.2)
Basos: 0 %
EOS (ABSOLUTE): 0.2 10*3/uL (ref 0.0–0.4)
Eos: 3 %
Hematocrit: 41.7 % (ref 37.5–51.0)
Hemoglobin: 13.4 g/dL (ref 13.0–17.7)
Immature Grans (Abs): 0 10*3/uL (ref 0.0–0.1)
Immature Granulocytes: 0 %
Lymphocytes Absolute: 1.7 10*3/uL (ref 0.7–3.1)
Lymphs: 31 %
MCH: 26.5 pg — ABNORMAL LOW (ref 26.6–33.0)
MCHC: 32.1 g/dL (ref 31.5–35.7)
MCV: 82 fL (ref 79–97)
Monocytes Absolute: 0.5 10*3/uL (ref 0.1–0.9)
Monocytes: 8 %
Neutrophils Absolute: 3.2 10*3/uL (ref 1.4–7.0)
Neutrophils: 58 %
Platelets: 291 10*3/uL (ref 150–379)
RBC: 5.06 x10E6/uL (ref 4.14–5.80)
RDW: 15.7 % — ABNORMAL HIGH (ref 12.3–15.4)
WBC: 5.6 10*3/uL (ref 3.4–10.8)

## 2016-11-06 LAB — BASIC METABOLIC PANEL
BUN/Creatinine Ratio: 16 (ref 10–24)
BUN: 22 mg/dL (ref 8–27)
CO2: 22 mmol/L (ref 18–29)
Calcium: 9.3 mg/dL (ref 8.6–10.2)
Chloride: 103 mmol/L (ref 96–106)
Creatinine, Ser: 1.35 mg/dL — ABNORMAL HIGH (ref 0.76–1.27)
GFR calc Af Amer: 56 — ABNORMAL LOW (ref >59)
GFR calc non Af Amer: 49 — ABNORMAL LOW (ref >59)
Glucose: 160 mg/dL — ABNORMAL HIGH (ref 65–99)
Potassium: 4.3 mmol/L (ref 3.5–5.2)
Sodium: 139 mmol/L (ref 134–144)

## 2016-11-06 NOTE — Patient Instructions (Signed)
Medication Instructions:  Your physician recommends that you continue on your current medications as directed. Please refer to the Current Medication list given to you today.   Labwork: TODAY: BMET, CBC  Fasting labs April 2nd: Lipid and liver  Testing/Procedures: None  Follow-Up: Your physician wants you to follow-up in: 6 months with Dr. Mayford Knifeurner. You will receive a reminder letter in the mail two months in advance. If you don't receive a letter, please call our office to schedule the follow-up appointment.   Any Other Special Instructions Will Be Listed Below (If Applicable).     If you need a refill on your cardiac medications before your next appointment, please call your pharmacy.

## 2016-11-06 NOTE — Progress Notes (Signed)
Cardiology Office Note    Date:  11/06/2016   ID:  Edward Suarez, DOB Apr 22, 1932, MRN [id number]  PCP:  Katy ApoPOLITE,RONALD D, MD  Cardiologist:  Armanda Magicraci Carlena Ruybal, MD   Chief Complaint  Patient presents with  . Follow-up    Complete HB, HTN, PVCs    History of Present Illness:  Edward Suarez is a 83 y.o. male  with a history of HTN, DM, dyslipidemia, Complete HB s/p PPM and PVC's.  He also has mild (1-39% bilateral) carotid artery stenosis and is followed by Dr. Darrick PennaFields.  He presents today for followup.  He is doing well. He denies any chest pain, SOB, DOE, LE edema, dizziness, palpitations or syncope.  He denies any PND, orthopnea or claudication.   Past Medical History:  Diagnosis Date  . Allergic rhinitis, cause unspecified   . BPH (benign prostatic hyperplasia)   . Carotid artery stenosis, asymptomatic    1-39% by dopplers 09/2016 followed by Dr. Darrick PennaFields  . Cataracts, bilateral   . Chronic kidney disease (CKD), stage III (moderate)   . Complete heart block (HCC) 01/23/04   s/p Medtronic PPM implanted by Dr Amil AmenEdmunds  . COPD (chronic obstructive pulmonary disease) (HCC)   . Diabetes mellitus   . Glaucoma   . Hyperlipidemia   . Hypertension   . 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 Kidney stones   . PAF (paroxysmal atrial fibrillation) (HCC) 10/09/2014   Noted on pacer check with 88 mode switches and longest episode >1 hour.  Now on Apixiban for CHADS2VASC score of 5    Past Surgical History:  Procedure Laterality Date  . PACEMAKER INSERTION  2005, 04/16/12   MDT implanted by Dr Amil AmenEdmunds with generator chnage (MDT Adapta L) by Dr Johney FrameAllred 04/16/12  . PERMANENT PACEMAKER GENERATOR CHANGE N/A 04/16/2012   Procedure: PERMANENT PACEMAKER GENERATOR CHANGE;  Surgeon: Hillis RangeJames Allred, MD;  Location: [facility] CATH LAB;  Service: Cardiovascular;  Laterality: N/A;    Current Medications: Current Meds  Medication Sig  . amLODipine (NORVASC) 5 MG tablet TAKE 1 TABLET (5 MG TOTAL) BY MOUTH  DAILY.  [name] Kitchen. apixaban (ELIQUIS) 5 MG TABS tablet Take 1 tablet (5 mg total) by mouth 2 (two) times daily.  [name] Kitchen. atorvastatin (LIPITOR) 80 MG tablet Take 1 tablet (80 mg total) by mouth every evening.  . cetirizine (ZYRTEC) 10 MG tablet Take 10 mg by mouth daily. For allergies  . fluticasone (FLONASE) 50 MCG/ACT nasal spray Place 2 sprays into both nostrils daily as needed for allergies or rhinitis.  [name] Kitchen. glipiZIDE (GLUCOTROL) 5 MG tablet Take 5 mg by mouth daily before breakfast.    . HYDROcodone-acetaminophen (NORCO/VICODIN) 5-325 MG tablet Take 1-2 tablets by mouth once.  . pioglitazone (ACTOS) 45 MG tablet Take 45 mg by mouth daily.   . predniSONE (DELTASONE) 10 MG tablet Take 10 mg by mouth daily with breakfast. taper    Allergies:   Penicillins   Social History   Social History  . Marital status: Married    Spouse name: N/A  . Number of children: 2  . Years of education: BS   Social History Main Topics  . Smoking status: Former Smoker    Types: Cigarettes    Quit date: 09/16/1998  . Smokeless tobacco: Never Used  . Alcohol use 3.0 oz/week    2 Glasses of wine, 3 Shots of liquor per week     Comment: drinks a tequila shot daily  . Drug use: No  . Sexual activity:  Not Asked   Other Topics Concern  . 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   Patient is married with 2 children.   Patient is right handed.   Patient has BS degree.   Patient drinks 1 cup daily.           Family History:  The patient's family history includes Heart attack in his father; Heart disease in his father.   ROS:   Please see the history of present illness.    ROS All other systems reviewed and are negative.  No flowsheet data found.     PHYSICAL EXAM:   VS:  BP 138/80   Pulse 84   Ht 5\' 8"  (1.727 m)   Wt 152 lb 12.8 oz (69.3 kg)   SpO2 96%   BMI 23.23 kg/m    GEN: Well nourished, well developed, in no acute distress  HEENT: normal  Neck: no JVD,  carotid bruits, or masses Cardiac: RRR; no murmurs, rubs, or gallops,no edema.  Intact distal pulses bilaterally.  Respiratory:  clear to auscultation bilaterally, normal work of breathing GI: soft, nontender, nondistended, + BS MS: no deformity or atrophy  Skin: warm and dry, no rash Neuro:  Alert and Oriented x 3, Strength and sensation are intact Psych: euthymic mood, full affect  Wt Readings from Last 3 Encounters:  11/06/16 152 lb 12.8 oz (69.3 kg)  10/16/16 154 lb 6.4 oz (70 kg)  05/09/16 156 lb (70.8 kg)      Studies/Labs Reviewed:   EKG:  EKG is not ordered today.    Recent Labs: 04/06/2016: BUN 19; Creatinine, Ser 1.19; Hemoglobin 13.6; Platelets 227; Potassium 3.7; Sodium 133 07/08/2016: ALT 16   Lipid Panel    Component Value Date/Time   CHOL 160 07/08/2016 0802   TRIG 111 07/08/2016 0802   HDL 60 07/08/2016 0802   CHOLHDL 2.7 07/08/2016 0802   VLDL 22 07/08/2016 0802   LDLCALC 78 07/08/2016 0802    Additional studies/ records that were reviewed today include:  none    ASSESSMENT:    1. Atrioventricular block, complete (HCC)   2. Essential hypertension   3. Carotid stenosis, bilateral   4. PVC (premature ventricular contraction)   5. Hypercholesterolemia without hypertriglyceridemia   6. Persistent atrial fibrillation (HCC)      PLAN:  In order of problems listed above:  1. Complete heart block s/p PPM followed in device clinic. 2. HTN - BP controlled on current meds. He will continue on amlodipine. 3. Bilateral carotid stenosis followed by Dr. Darrick Penna.  He will continue on statin.  No ASA as he is on Apixaban. 4. PVCs - asymptomatic 5. Hyperlipidemia with LDL goal < 70.  Continue high dose statin.  Check FLP and ALT 12/2016.  Last LDL was borderline at 78.  6. Persistent atrial fibrillation maintaining NSR.  Continue Apixaban.  Check BMET and CBC.    Medication Adjustments/Labs and Tests Ordered: Current medicines are reviewed at length with  the patient today.  Concerns regarding medicines are outlined above.  Medication changes, Labs and Tests ordered today are listed in the Patient Instructions below.  There are no Patient Instructions on file for this visit.   Signed, Armanda Magic, MD  11/06/2016 10:07 AM    [facility] [address] [place], [place], [place]  [address] Phone: [phone]; Fax: [phone]

## 2016-11-20 ENCOUNTER — Ambulatory Visit (INDEPENDENT_AMBULATORY_CARE_PROVIDER_SITE_OTHER): Payer: PPO | Admitting: Internal Medicine

## 2016-11-20 ENCOUNTER — Encounter: Payer: Self-pay | Admitting: Internal Medicine

## 2016-11-20 VITALS — BP 138/68 | HR 81 | Ht 68.0 in | Wt 155.4 lb

## 2016-11-20 DIAGNOSIS — I10 Essential (primary) hypertension: Secondary | ICD-10-CM | POA: Diagnosis not present

## 2016-11-20 DIAGNOSIS — I442 Atrioventricular block, complete: Secondary | ICD-10-CM | POA: Diagnosis not present

## 2016-11-20 DIAGNOSIS — Z950 Presence of cardiac pacemaker: Secondary | ICD-10-CM | POA: Diagnosis not present

## 2016-11-20 DIAGNOSIS — I480 Paroxysmal atrial fibrillation: Secondary | ICD-10-CM | POA: Diagnosis not present

## 2016-11-20 LAB — CUP PACEART INCLINIC DEVICE CHECK
Battery Impedance: 477 Ohm
Battery Remaining Longevity: 74 mo
Battery Voltage: 2.78 V
Brady Statistic AP VP Percent: 83 %
Brady Statistic AP VS Percent: 0 %
Brady Statistic AS VP Percent: 16 %
Brady Statistic AS VS Percent: 0 %
Date Time Interrogation Session: 20180307105017
Implantable Lead Implant Date: 20050509
Implantable Lead Implant Date: 20050509
Implantable Lead Location: 753859
Implantable Lead Location: 753860
Implantable Lead Model: 5076
Implantable Lead Model: 5092
Implantable Pulse Generator Implant Date: 20130801
Lead Channel Impedance Value: 383 Ohm
Lead Channel Impedance Value: 575 Ohm
Lead Channel Pacing Threshold Amplitude: 0.5 V
Lead Channel Pacing Threshold Amplitude: 0.75 V
Lead Channel Pacing Threshold Pulse Width: 0.4 ms
Lead Channel Pacing Threshold Pulse Width: 0.4 ms
Lead Channel Sensing Intrinsic Amplitude: 2 mV
Lead Channel Setting Pacing Amplitude: 2 V
Lead Channel Setting Pacing Amplitude: 2.5 V
Lead Channel Setting Pacing Pulse Width: 0.4 ms
Lead Channel Setting Sensing Sensitivity: 4 mV

## 2016-11-20 NOTE — Patient Instructions (Addendum)
Medication Instructions:  Your physician recommends that you continue on your current medications as directed. Please refer to the Current Medication list given to you today.   Labwork: None ordered   Testing/Procedures: None ordered   Follow-Up: Remote monitoring is used to monitor your Pacemaker from home. This monitoring reduces the number of office visits required to check your device to one time per year. It allows us to keep an eye on the functioning of your device to ensure it is working properly. You are scheduled for a device check from home on 02/19/17. You may send your transmission at any time that day. If you have a wireless device, the transmission will be sent automatically. After your physician reviews your transmission, you will receive a postcard with your next transmission date.  Your physician wants you to follow-up in: 12 months with Amber Seiler, NP You will receive a reminder letter in the mail two months in advance. If you don't receive a letter, please call our office to schedule the follow-up appointment.    Any Other Special Instructions Will Be Listed Below (If Applicable).     If you need a refill on your cardiac medications before your next appointment, please call your pharmacy.   

## 2016-11-20 NOTE — Progress Notes (Signed)
Electrophysiology Office Note   Date:  11/20/2016   ID:  Edward Suarez, DOB [birth year], MRN [id number]  PCP:  Katy Apo, MD  Cardiologist:  Dr Mayford Knife Primary Electrophysiologist: Hillis Range, MD    Chief Complaint  Patient presents with  . Atrial Fibrillation     History of Present Illness: Edward Suarez is a 83 y.o. male who presents today for electrophysiology evaluation.   He has done well recently.  He remains active.  He has been found to have afib on device interrogation for which he has been asymptomatic. He is tolerating eliquis without bleeding.  He sold his home and has moved to [facility].  Today, he denies symptoms of palpitations, chest pain, shortness of breath, orthopnea, PND, lower extremity edema, claudication, dizziness, presyncope, syncope, bleeding, or neurologic sequela. The patient is tolerating medications without difficulties and is otherwise without complaint today.    Past Medical History:  Diagnosis Date  . Allergic rhinitis, cause unspecified   . BPH (benign prostatic hyperplasia)   . Carotid artery stenosis, asymptomatic    1-39% by dopplers 09/2016 followed by Dr. Darrick Penna  . Cataracts, bilateral   . Chronic kidney disease (CKD), stage III (moderate)   . Complete heart block (HCC) 01/23/04   s/p Medtronic PPM implanted by Dr Amil Amen  . COPD (chronic obstructive pulmonary disease) (HCC)   . Diabetes mellitus   . Glaucoma   . Hyperlipidemia   . Hypertension   . 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 Kidney stones   . PAF (paroxysmal atrial fibrillation) (HCC) 10/09/2014   Noted on pacer check with 88 mode switches and longest episode >1 hour.  Now on Apixiban for CHADS2VASC score of 5   Past Surgical History:  Procedure Laterality Date  . PACEMAKER INSERTION  2005, 04/16/12   MDT implanted by Dr Amil Amen with generator chnage (MDT Adapta L) by Dr Johney Frame 04/16/12  . PERMANENT PACEMAKER GENERATOR CHANGE N/A 04/16/2012   Procedure:  PERMANENT PACEMAKER GENERATOR CHANGE;  Surgeon: Hillis Range, MD;  Location: [facility] CATH LAB;  Service: Cardiovascular;  Laterality: N/A;     Current Outpatient Prescriptions  Medication Sig Dispense Refill  . amLODipine (NORVASC) 5 MG tablet TAKE 1 TABLET (5 MG TOTAL) BY MOUTH DAILY. 90 tablet 3  . apixaban (ELIQUIS) 5 MG TABS tablet Take 1 tablet (5 mg total) by mouth 2 (two) times daily. 60 tablet 11  . atorvastatin (LIPITOR) 80 MG tablet Take 1 tablet (80 mg total) by mouth every evening. 30 tablet 11  . cetirizine (ZYRTEC) 10 MG tablet Take 10 mg by mouth daily. For allergies    . fluticasone (FLONASE) 50 MCG/ACT nasal spray Place 2 sprays into both nostrils daily as needed for allergies or rhinitis.    [name] Kitchen glipiZIDE (GLUCOTROL) 5 MG tablet Take 5 mg by mouth daily before breakfast.      . pioglitazone (ACTOS) 45 MG tablet Take 45 mg by mouth daily.      No current facility-administered medications for this visit.     Allergies:   Penicillins   Social History:  The patient  reports that he quit smoking about 18 years ago. His smoking use included Cigarettes. He has never used smokeless tobacco. He reports that he drinks about 3.0 oz of alcohol per week . He reports that he does not use drugs.   Family History:  The patient's family history includes Heart attack in his father; Heart disease in his father.    ROS:  Please see the history of present illness.   All other systems are reviewed and negative.    PHYSICAL EXAM: VS:  BP 138/68   Pulse 81   Ht 5\' 8"  (1.727 m)   Wt 155 lb 6.4 oz (70.5 kg)   SpO2 99%   BMI 23.63 kg/m  , BMI Body mass index is 23.63 kg/m. GEN: Well nourished, well developed, in no acute distress  HEENT: normal  Neck: no JVD, carotid bruits, or masses Cardiac: RRR; no murmurs, rubs, or gallops,no edema  Respiratory:  clear to auscultation bilaterally, normal work of breathing GI: soft, nontender, nondistended, + BS MS: no deformity or atrophy  Skin: warm  and dry, device pocket is well healed Neuro:  Strength and sensation are intact Psych: euthymic mood, full affect  Device interrogation is reviewed today in detail.  See PaceArt for details.  Recent Labs: 04/06/2016: Hemoglobin 13.6 07/08/2016: ALT 16 11/06/2016: BUN 22; Creatinine, Ser 1.35; Platelets 291; Potassium 4.3; Sodium 139    Lipid Panel     Component Value Date/Time   CHOL 160 07/08/2016 0802   TRIG 111 07/08/2016 0802   HDL 60 07/08/2016 0802   CHOLHDL 2.7 07/08/2016 0802   VLDL 22 07/08/2016 0802   LDLCALC 78 07/08/2016 0802     Wt Readings from Last 3 Encounters:  11/20/16 155 lb 6.4 oz (70.5 kg)  11/06/16 152 lb 12.8 oz (69.3 kg)  10/16/16 154 lb 6.4 oz (70 kg)     ASSESSMENT AND PLAN:  1.  Complete heart block Normal pacemaker function See Pace Art report No changes today  2. Paroxysmal atrial fibrillaton Recently initiated on eliquis for chads2vasc score of 5 No changes today  3. HTN Stable No change required today  Current medicines are reviewed at length with the patient today.   The patient does not have concerns regarding his medicines.  The following changes were made today:  none   Follow-up: carelink, return to see Gypsy BalsamAmber Seiler NP in the EP device clinic every year  Signed, Hillis RangeJames Gunnard Dorrance, MD  11/20/2016 10:15 AM     [facility] [address] [place] [address] [phone] (office) [phone] (fax)

## 2016-12-16 ENCOUNTER — Other Ambulatory Visit: Payer: PPO | Admitting: *Deleted

## 2016-12-16 ENCOUNTER — Encounter (INDEPENDENT_AMBULATORY_CARE_PROVIDER_SITE_OTHER): Payer: Self-pay

## 2016-12-16 DIAGNOSIS — E7800 Pure hypercholesterolemia, unspecified: Secondary | ICD-10-CM

## 2016-12-16 LAB — HEPATIC FUNCTION PANEL
ALT: 16 IU/L (ref 0–44)
AST: 21 IU/L (ref 0–40)
Albumin: 4.3 g/dL (ref 3.5–4.7)
Alkaline Phosphatase: 61 IU/L (ref 39–117)
Bilirubin Total: 0.3 mg/dL (ref 0.0–1.2)
Bilirubin, Direct: 0.12 mg/dL (ref 0.00–0.40)
Total Protein: 5.9 g/dL — ABNORMAL LOW (ref 6.0–8.5)

## 2016-12-16 LAB — LIPID PANEL
Chol/HDL Ratio: 2.3 ratio (ref 0.0–5.0)
Cholesterol, Total: 128 mg/dL (ref 100–199)
HDL: 55 mg/dL (ref >39)
LDL Calculated: 58 mg/dL (ref 0–99)
Triglycerides: 74 mg/dL (ref 0–149)
VLDL Cholesterol Cal: 15 mg/dL (ref 5–40)

## 2016-12-31 DIAGNOSIS — H40013 Open angle with borderline findings, low risk, bilateral: Secondary | ICD-10-CM | POA: Diagnosis not present

## 2016-12-31 DIAGNOSIS — E119 Type 2 diabetes mellitus without complications: Secondary | ICD-10-CM | POA: Diagnosis not present

## 2017-02-19 ENCOUNTER — Ambulatory Visit (INDEPENDENT_AMBULATORY_CARE_PROVIDER_SITE_OTHER): Payer: PPO | Admitting: *Deleted

## 2017-02-19 ENCOUNTER — Telehealth: Payer: Self-pay | Admitting: Cardiology

## 2017-02-19 DIAGNOSIS — I442 Atrioventricular block, complete: Secondary | ICD-10-CM

## 2017-02-19 NOTE — Progress Notes (Signed)
Remote pacemaker transmission.   

## 2017-02-19 NOTE — Telephone Encounter (Signed)
LMOVM reminding pt to send remote transmission.   

## 2017-02-21 LAB — CUP PACEART REMOTE DEVICE CHECK
Battery Impedance: 527 Ohm
Battery Remaining Longevity: 68 mo
Battery Voltage: 2.78 V
Brady Statistic AP VP Percent: 96 %
Brady Statistic AP VS Percent: 0 %
Brady Statistic AS VP Percent: 4 %
Brady Statistic AS VS Percent: 0 %
Date Time Interrogation Session: 20180606141916
Implantable Lead Implant Date: 20050509
Implantable Lead Implant Date: 20050509
Implantable Lead Location: 753859
Implantable Lead Location: 753860
Implantable Lead Model: 5076
Implantable Lead Model: 5092
Implantable Pulse Generator Implant Date: 20130801
Lead Channel Impedance Value: 389 Ohm
Lead Channel Impedance Value: 596 Ohm
Lead Channel Pacing Threshold Amplitude: 0.625 V
Lead Channel Pacing Threshold Amplitude: 0.75 V
Lead Channel Pacing Threshold Pulse Width: 0.4 ms
Lead Channel Pacing Threshold Pulse Width: 0.4 ms
Lead Channel Setting Pacing Amplitude: 2 V
Lead Channel Setting Pacing Amplitude: 2.5 V
Lead Channel Setting Pacing Pulse Width: 0.46 ms
Lead Channel Setting Sensing Sensitivity: 4 mV

## 2017-02-25 ENCOUNTER — Encounter: Payer: Self-pay | Admitting: Cardiology

## 2017-03-11 ENCOUNTER — Ambulatory Visit
Admission: RE | Admit: 2017-03-11 | Discharge: 2017-03-11 | Disposition: A | Discharge status: Home / Self Care | Payer: PPO | Source: Ambulatory Visit | Attending: Internal Medicine | Admitting: Internal Medicine

## 2017-03-11 ENCOUNTER — Other Ambulatory Visit: Payer: Self-pay | Admitting: Internal Medicine

## 2017-03-11 DIAGNOSIS — J439 Emphysema, unspecified: Secondary | ICD-10-CM | POA: Diagnosis not present

## 2017-03-11 DIAGNOSIS — J449 Chronic obstructive pulmonary disease, unspecified: Secondary | ICD-10-CM | POA: Diagnosis not present

## 2017-03-11 DIAGNOSIS — J069 Acute upper respiratory infection, unspecified: Secondary | ICD-10-CM | POA: Diagnosis not present

## 2017-03-11 DIAGNOSIS — R05 Cough: Secondary | ICD-10-CM

## 2017-03-11 DIAGNOSIS — R059 Cough, unspecified: Secondary | ICD-10-CM

## 2017-04-10 DIAGNOSIS — E7800 Pure hypercholesterolemia, unspecified: Secondary | ICD-10-CM | POA: Diagnosis not present

## 2017-04-10 DIAGNOSIS — E1122 Type 2 diabetes mellitus with diabetic chronic kidney disease: Secondary | ICD-10-CM | POA: Diagnosis not present

## 2017-04-10 DIAGNOSIS — I10 Essential (primary) hypertension: Secondary | ICD-10-CM | POA: Diagnosis not present

## 2017-04-10 DIAGNOSIS — N183 Chronic kidney disease, stage 3 (moderate): Secondary | ICD-10-CM | POA: Diagnosis not present

## 2017-04-10 DIAGNOSIS — F09 Unspecified mental disorder due to known physiological condition: Secondary | ICD-10-CM | POA: Diagnosis not present

## 2017-04-28 ENCOUNTER — Encounter: Payer: Self-pay | Admitting: Cardiology

## 2017-05-14 ENCOUNTER — Ambulatory Visit (INDEPENDENT_AMBULATORY_CARE_PROVIDER_SITE_OTHER): Payer: PPO | Admitting: Cardiology

## 2017-05-14 ENCOUNTER — Encounter: Payer: Self-pay | Admitting: Cardiology

## 2017-05-14 VITALS — BP 148/70 | HR 84 | Ht 68.0 in | Wt 157.8 lb

## 2017-05-14 DIAGNOSIS — I480 Paroxysmal atrial fibrillation: Secondary | ICD-10-CM | POA: Diagnosis not present

## 2017-05-14 DIAGNOSIS — I10 Essential (primary) hypertension: Secondary | ICD-10-CM

## 2017-05-14 DIAGNOSIS — E7800 Pure hypercholesterolemia, unspecified: Secondary | ICD-10-CM

## 2017-05-14 DIAGNOSIS — I6523 Occlusion and stenosis of bilateral carotid arteries: Secondary | ICD-10-CM

## 2017-05-14 DIAGNOSIS — I442 Atrioventricular block, complete: Secondary | ICD-10-CM | POA: Diagnosis not present

## 2017-05-14 LAB — CBC WITH DIFFERENTIAL/PLATELET
Basophils Absolute: 0 10*3/uL (ref 0.0–0.2)
Basos: 0 %
EOS (ABSOLUTE): 0.2 10*3/uL (ref 0.0–0.4)
Eos: 2 %
Hematocrit: 44.1 % (ref 37.5–51.0)
Hemoglobin: 14.2 g/dL (ref 13.0–17.7)
Immature Grans (Abs): 0 10*3/uL (ref 0.0–0.1)
Immature Granulocytes: 0 %
Lymphocytes Absolute: 2.2 10*3/uL (ref 0.7–3.1)
Lymphs: 33 %
MCH: 26.6 pg (ref 26.6–33.0)
MCHC: 32.2 g/dL (ref 31.5–35.7)
MCV: 83 fL (ref 79–97)
Monocytes Absolute: 0.6 10*3/uL (ref 0.1–0.9)
Monocytes: 9 %
Neutrophils Absolute: 3.5 10*3/uL (ref 1.4–7.0)
Neutrophils: 56 %
Platelets: 249 10*3/uL (ref 150–379)
RBC: 5.34 x10E6/uL (ref 4.14–5.80)
RDW: 16 % — ABNORMAL HIGH (ref 12.3–15.4)
WBC: 6.4 10*3/uL (ref 3.4–10.8)

## 2017-05-14 LAB — BASIC METABOLIC PANEL
BUN/Creatinine Ratio: 14 (ref 10–24)
BUN: 21 mg/dL (ref 8–27)
CO2: 22 mmol/L (ref 20–29)
Calcium: 9.5 mg/dL (ref 8.6–10.2)
Chloride: 105 mmol/L (ref 96–106)
Creatinine, Ser: 1.47 mg/dL — ABNORMAL HIGH (ref 0.76–1.27)
GFR calc Af Amer: 50 mL/min/{1.73_m2} — ABNORMAL LOW (ref >59)
GFR calc non Af Amer: 43 mL/min/{1.73_m2} — ABNORMAL LOW (ref >59)
Glucose: 48 mg/dL — ABNORMAL LOW (ref 65–99)
Potassium: 4.3 mmol/L (ref 3.5–5.2)
Sodium: 144 mmol/L (ref 134–144)

## 2017-05-14 NOTE — Progress Notes (Signed)
Cardiology Office Note:    Date:  05/14/2017   ID:  Edward RoyalSheldon I Brillhart, DOB 1932/09/04, MRN [id number]  PCP:  Renford DillsPolite, Ronald, MD  Cardiologist:  Armanda Magicraci Valinda Fedie, MD   Referring MD: Renford DillsPolite, Ronald, MD   Chief Complaint  Patient presents with  . Follow-up    HT, CHB s/p PPM, PVCs    History of Present Illness:    Edward Suarez is a 83 y.o. male with a hx of HTN, DM, dyslipidemia, complete HB s/p PPM and PVC's.  He also has mild (1-39% bilateral) carotid artery stenosis and is followed by Dr. Darrick PennaFields. He also has  PAF noted on pacer check in 2016 and [facility] was started on Eliquis.  He is back today for followup and is doing well.  He denies any chest pain, SOB, DOE, PND, orthopnea, LE edema, dizziness, palpitations or syncope.   Past Medical History:  Diagnosis Date  . Allergic rhinitis, cause unspecified   . BPH (benign prostatic hyperplasia)   . Carotid artery stenosis, asymptomatic    1-39% by dopplers 09/2016 followed by Dr. Darrick PennaFields  . Cataracts, bilateral   . Chronic kidney disease (CKD), stage III (moderate)   . Complete heart block (HCC) 01/23/04   s/p Medtronic PPM implanted by Dr Amil AmenEdmunds  . COPD (chronic obstructive pulmonary disease) (HCC)   . Diabetes mellitus   . Glaucoma   . Hyperlipidemia   . Hypertension   . 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 Kidney stones   . PAF (paroxysmal atrial fibrillation) (HCC) 10/09/2014   Noted on pacer check with 88 mode switches and longest episode >1 hour.  Now on Apixiban for CHADS2VASC score of 5    Past Surgical History:  Procedure Laterality Date  . PACEMAKER INSERTION  2005, 04/16/12   MDT implanted by Dr Amil AmenEdmunds with generator chnage (MDT Adapta L) by Dr Johney FrameAllred 04/16/12  . PERMANENT PACEMAKER GENERATOR CHANGE N/A 04/16/2012   Procedure: PERMANENT PACEMAKER GENERATOR CHANGE;  Surgeon: Hillis RangeJames Allred, MD;  [facility];  Service: Cardiovascular;  Laterality: N/A;    Current  Medications: Current Meds  Medication Sig  . amLODipine (NORVASC) 5 MG tablet TAKE 1 TABLET (5 MG TOTAL) BY MOUTH DAILY.  [name] Kitchen. apixaban (ELIQUIS) 5 MG TABS tablet Take 1 tablet (5 mg total) by mouth 2 (two) times daily.  [name] Kitchen. atorvastatin (LIPITOR) 80 MG tablet Take 1 tablet (80 mg total) by mouth every evening.  . cetirizine (ZYRTEC) 10 MG tablet Take 10 mg by mouth daily. For allergies  . fluticasone (FLONASE) 50 MCG/ACT nasal spray Place 2 sprays into both nostrils daily as needed for allergies or rhinitis.  [name] Kitchen. glipiZIDE (GLUCOTROL) 5 MG tablet Take 5 mg by mouth daily before breakfast.    . pioglitazone (ACTOS) 45 MG tablet Take 45 mg by mouth daily.      Allergies:   Penicillins   Social History   Social History  . Marital status: Married    Spouse name: N/A  . Number of children: 2  . Years of education: BS   Social History Main Topics  . Smoking status: Former Smoker    Types: Cigarettes    Quit date: 09/16/1998  . Smokeless tobacco: Never Used  . Alcohol use 3.0 oz/week    2 Glasses of wine, 3 Shots of liquor per week     Comment: drinks a tequila shot daily  . Drug use: No  . Sexual activity: Not Asked  Other Topics Concern  . 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   Patient is married with 2 children.   Patient is right handed.   Patient has BS degree.   Patient drinks 1 cup daily.           Family History: The patient's family history includes Heart attack in his father; Heart disease in his father.  ROS:   Please see the history of present illness.     All other systems reviewed and are negative.  EKGs/Labs/Other Studies Reviewed:    The following studies were reviewed today: none  EKG:  EKG is  ordered today.  The ekg ordered today demonstrates AV paced rhythm  Recent Labs: 11/06/2016: BUN 22; Creatinine, Ser 1.35; Hemoglobin 13.4; Platelets 291; Potassium 4.3; Sodium 139 12/16/2016: ALT 16   Recent Lipid  Panel    Component Value Date/Time   CHOL 128 12/16/2016 0822   TRIG 74 12/16/2016 0822   HDL 55 12/16/2016 0822   CHOLHDL 2.3 12/16/2016 0822   CHOLHDL 2.7 07/08/2016 0802   VLDL 22 07/08/2016 0802   LDLCALC 58 12/16/2016 0822    Physical Exam:    VS:  BP (!) 148/70   Pulse 84   Ht 5\' 8"  (1.727 m)   Wt 157 lb 12.8 oz (71.6 kg)   BMI 23.99 kg/m     Wt Readings from Last 3 Encounters:  05/14/17 157 lb 12.8 oz (71.6 kg)  11/20/16 155 lb 6.4 oz (70.5 kg)  11/06/16 152 lb 12.8 oz (69.3 kg)     GEN:  Well nourished, well developed in no acute distress HEENT: Normal NECK: No JVD; No carotid bruits LYMPHATICS: No lymphadenopathy CARDIAC: RRR, no murmurs, rubs, gallops RESPIRATORY:  Clear to auscultation without rales, wheezing or rhonchi  ABDOMEN: Soft, non-tender, non-distended MUSCULOSKELETAL:  No edema; No deformity  SKIN: Warm and dry NEUROLOGIC:  Alert and oriented x 3 PSYCHIATRIC:  Normal affect   ASSESSMENT:    1. PAF (paroxysmal atrial fibrillation) (HCC)   2. Atrioventricular block, complete (HCC)   3. Essential hypertension   4. Carotid stenosis, bilateral   5. Hypercholesterolemia without hypertriglyceridemia    PLAN:    In order of problems listed above:  1. Paroxysmal atrial fibrillation - he is maintaining NSR.  He will continue on apixaban 5mg  BID.  I will check a BMET and CBC today.  2. Complete heart block s/p PPM followed in pacer clinic. 3. HTN - Bp is fairly well controlled on current meds.  He will continue on amlodipine 5mg  daily.   4. Mild bilateral carotid artery stenosis - followed by Dr. Darrick Penna. Continue statin.  No ASA due to DOAC. 5. Hyperlipidemia with LDL goal < 70 due to carotid disease.  He will continue on atorvastatin 80mg  daily.  In April 2018 his LDL was 58.     Medication Adjustments/Labs and Tests Ordered: Current medicines are reviewed at length with the patient today.  Concerns regarding medicines are outlined above.  No  orders of the defined types were placed in this encounter.  No orders of the defined types were placed in this encounter.   Signed, Armanda Magic, MD  05/14/2017 10:48 AM    [facility]

## 2017-05-14 NOTE — Patient Instructions (Signed)
Medication Instructions:  Your provider recommends that you continue on your current medications as directed. Please refer to the Current Medication list given to you today.    Labwork: TODAY: BMET, CBC  Testing/Procedures: None  Follow-Up: Your provider wants you to follow-up in: 6 months with Dr. Turner. You will receive a reminder letter in the mail two months in advance. If you don't receive a letter, please call our office to schedule the follow-up appointment.    Any Other Special Instructions Will Be Listed Below (If Applicable).     If you need a refill on your cardiac medications before your next appointment, please call your pharmacy.   

## 2017-05-20 ENCOUNTER — Other Ambulatory Visit: Payer: Self-pay | Admitting: *Deleted

## 2017-05-20 DIAGNOSIS — N189 Chronic kidney disease, unspecified: Secondary | ICD-10-CM

## 2017-05-20 DIAGNOSIS — I480 Paroxysmal atrial fibrillation: Secondary | ICD-10-CM

## 2017-05-21 ENCOUNTER — Encounter: Payer: PPO | Admitting: *Deleted

## 2017-05-22 ENCOUNTER — Other Ambulatory Visit: Payer: Self-pay | Admitting: Cardiology

## 2017-05-22 NOTE — Telephone Encounter (Signed)
Request received for Eliquis 5mg ; pt is 83 yrs old, wt-71.6kg, Crea-1.47 on 05/14/17, last seen by Dr. Mayford Knifeurner on 05/14/17. Will send in refill to requested pharmacy.

## 2017-05-27 ENCOUNTER — Ambulatory Visit (INDEPENDENT_AMBULATORY_CARE_PROVIDER_SITE_OTHER): Payer: PPO | Admitting: *Deleted

## 2017-05-27 DIAGNOSIS — I442 Atrioventricular block, complete: Secondary | ICD-10-CM

## 2017-05-29 LAB — CUP PACEART REMOTE DEVICE CHECK
Battery Impedance: 628 Ohm
Battery Remaining Longevity: 63 mo
Battery Voltage: 2.78 V
Brady Statistic AP VP Percent: 95 %
Brady Statistic AP VS Percent: 0 %
Brady Statistic AS VP Percent: 5 %
Brady Statistic AS VS Percent: 0 %
Date Time Interrogation Session: 20180911193917
Implantable Lead Implant Date: 20050509
Implantable Lead Implant Date: 20050509
Implantable Lead Location: 753859
Implantable Lead Location: 753860
Implantable Lead Model: 5076
Implantable Lead Model: 5092
Implantable Pulse Generator Implant Date: 20130801
Lead Channel Impedance Value: 398 Ohm
Lead Channel Impedance Value: 586 Ohm
Lead Channel Pacing Threshold Amplitude: 0.625 V
Lead Channel Pacing Threshold Amplitude: 0.875 V
Lead Channel Pacing Threshold Pulse Width: 0.4 ms
Lead Channel Pacing Threshold Pulse Width: 0.4 ms
Lead Channel Setting Pacing Amplitude: 2 V
Lead Channel Setting Pacing Amplitude: 2.5 V
Lead Channel Setting Pacing Pulse Width: 0.46 ms
Lead Channel Setting Sensing Sensitivity: 4 mV

## 2017-05-29 NOTE — Progress Notes (Signed)
Remote pacemaker transmission.   

## 2017-05-30 ENCOUNTER — Encounter: Payer: Self-pay | Admitting: Cardiology

## 2017-06-01 ENCOUNTER — Other Ambulatory Visit: Payer: Self-pay | Admitting: Cardiology

## 2017-06-03 ENCOUNTER — Other Ambulatory Visit: Payer: PPO

## 2017-06-03 DIAGNOSIS — N189 Chronic kidney disease, unspecified: Secondary | ICD-10-CM | POA: Diagnosis not present

## 2017-06-03 DIAGNOSIS — I480 Paroxysmal atrial fibrillation: Secondary | ICD-10-CM

## 2017-06-03 LAB — BASIC METABOLIC PANEL
BUN/Creatinine Ratio: 13 (ref 10–24)
BUN: 20 mg/dL (ref 8–27)
CO2: 24 mmol/L (ref 20–29)
Calcium: 9.5 mg/dL (ref 8.6–10.2)
Chloride: 106 mmol/L (ref 96–106)
Creatinine, Ser: 1.51 mg/dL — ABNORMAL HIGH (ref 0.76–1.27)
GFR calc Af Amer: 49 mL/min/{1.73_m2} — ABNORMAL LOW (ref >59)
GFR calc non Af Amer: 42 mL/min/{1.73_m2} — ABNORMAL LOW (ref >59)
Glucose: 117 mg/dL — ABNORMAL HIGH (ref 65–99)
Potassium: 4.2 mmol/L (ref 3.5–5.2)
Sodium: 142 mmol/L (ref 134–144)

## 2017-06-05 ENCOUNTER — Telehealth: Payer: Self-pay | Admitting: Cardiology

## 2017-06-05 DIAGNOSIS — N189 Chronic kidney disease, unspecified: Secondary | ICD-10-CM

## 2017-06-05 NOTE — Telephone Encounter (Signed)
New message ° ° ° °Pt is calling back for his lab results ° ° °

## 2017-06-05 NOTE — Telephone Encounter (Signed)
Pt is aware of lab results and MD's recommendations. Pt has an appointment to repeat labs next week  on Thursday. Pt is aware.

## 2017-06-12 ENCOUNTER — Other Ambulatory Visit: Payer: PPO | Admitting: *Deleted

## 2017-06-12 DIAGNOSIS — N189 Chronic kidney disease, unspecified: Secondary | ICD-10-CM | POA: Diagnosis not present

## 2017-06-12 LAB — BASIC METABOLIC PANEL
BUN/Creatinine Ratio: 15 (ref 10–24)
BUN: 21 mg/dL (ref 8–27)
CO2: 22 mmol/L (ref 20–29)
Calcium: 9.6 mg/dL (ref 8.6–10.2)
Chloride: 106 mmol/L (ref 96–106)
Creatinine, Ser: 1.42 mg/dL — ABNORMAL HIGH (ref 0.76–1.27)
GFR calc Af Amer: 52 mL/min/{1.73_m2} — ABNORMAL LOW (ref >59)
GFR calc non Af Amer: 45 mL/min/{1.73_m2} — ABNORMAL LOW (ref >59)
Glucose: 77 mg/dL (ref 65–99)
Potassium: 4.3 mmol/L (ref 3.5–5.2)
Sodium: 143 mmol/L (ref 134–144)

## 2017-07-01 DIAGNOSIS — H40013 Open angle with borderline findings, low risk, bilateral: Secondary | ICD-10-CM | POA: Diagnosis not present

## 2017-07-02 DIAGNOSIS — Z23 Encounter for immunization: Secondary | ICD-10-CM | POA: Diagnosis not present

## 2017-08-28 ENCOUNTER — Telehealth: Payer: Self-pay | Admitting: Cardiology

## 2017-08-28 ENCOUNTER — Encounter: Payer: PPO | Admitting: *Deleted

## 2017-08-28 NOTE — Telephone Encounter (Signed)
Spoke with pt and reminded pt of remote transmission that is due today. Pt verbalized understanding.   

## 2017-08-29 ENCOUNTER — Encounter: Payer: Self-pay | Admitting: Cardiology

## 2017-09-04 ENCOUNTER — Ambulatory Visit (INDEPENDENT_AMBULATORY_CARE_PROVIDER_SITE_OTHER): Payer: PPO | Admitting: *Deleted

## 2017-09-04 ENCOUNTER — Telehealth: Payer: Self-pay | Admitting: Cardiology

## 2017-09-04 DIAGNOSIS — I442 Atrioventricular block, complete: Secondary | ICD-10-CM

## 2017-09-04 NOTE — Telephone Encounter (Signed)
Transmission received. Pt aware.

## 2017-09-04 NOTE — Telephone Encounter (Signed)
New message ° ° ° ° °1. Has your device fired? NO ° °2. Is you device beeping? NO ° °3. Are you experiencing draining or swelling at device site? NO ° °4. Are you calling to see if we received your device transmission? YES ° °5. Have you passed out? NO ° ° ° °Please route to Device Clinic Pool °

## 2017-09-05 ENCOUNTER — Encounter: Payer: Self-pay | Admitting: Cardiology

## 2017-09-05 NOTE — Progress Notes (Signed)
Remote pacemaker transmission.   

## 2017-09-18 LAB — CUP PACEART REMOTE DEVICE CHECK
Battery Impedance: 678 Ohm
Battery Remaining Longevity: 62 mo
Battery Voltage: 2.78 V
Brady Statistic AP VP Percent: 94 %
Brady Statistic AP VS Percent: 0 %
Brady Statistic AS VP Percent: 5 %
Brady Statistic AS VS Percent: 0 %
Date Time Interrogation Session: 20181220204223
Implantable Lead Implant Date: 20050509
Implantable Lead Implant Date: 20050509
Implantable Lead Location: 753859
Implantable Lead Location: 753860
Implantable Lead Model: 5076
Implantable Lead Model: 5092
Implantable Pulse Generator Implant Date: 20130801
Lead Channel Impedance Value: 399 Ohm
Lead Channel Impedance Value: 606 Ohm
Lead Channel Pacing Threshold Amplitude: 0.5 V
Lead Channel Pacing Threshold Amplitude: 0.875 V
Lead Channel Pacing Threshold Pulse Width: 0.4 ms
Lead Channel Pacing Threshold Pulse Width: 0.4 ms
Lead Channel Setting Pacing Amplitude: 2 V
Lead Channel Setting Pacing Amplitude: 2.5 V
Lead Channel Setting Pacing Pulse Width: 0.4 ms
Lead Channel Setting Sensing Sensitivity: 4 mV

## 2017-09-25 ENCOUNTER — Other Ambulatory Visit: Payer: Self-pay | Admitting: Cardiology

## 2017-10-13 IMAGING — NM NM RAI THERAPY FOR HYPERTHYROIDISM
1 series · 1 of 1 positions shown · non-contrast
Comparison: None.

CLINICAL DATA: Autonomous hyperfunctioning LEFT thyroid nodule.
Depressed TSH equal 0.01.

EXAM:
RADIOACTIVE IODINE THERAPY FOR HYPERTHYROIDISM
TECHNIQUE: Radioactive iodine prescribed by Dr. Billie. The risks and benefits
of radioactive iodine therapy were discussed with the patient in
detail by Dr. Tabernacle. Alternative therapies were also mentioned.
Radiation safety was discussed with the patient, including how to
protect the general public from exposure. There were no barriers to
communication. Written consent was obtained. The patient then
received a capsule containing the radiopharmaceutical.
The patient will follow-up with the referring physician.
RADIOPHARMACEUTICALS:  29.2 mCi J-FXF sodium iodide orally

[Series 1: static · 2.07mm/px · 1 of 1 slices shown]
[im 1/1]
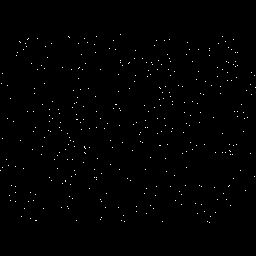

[1 of 1 positions shown; findings below may reference images not displayed]

IMPRESSION: Per oral administration of J-FXF sodium iodide for the treatment of
hyperfunctioning nodule with thyrotoxicosis.

## 2017-10-20 DIAGNOSIS — E039 Hypothyroidism, unspecified: Secondary | ICD-10-CM | POA: Diagnosis not present

## 2017-10-20 DIAGNOSIS — N183 Chronic kidney disease, stage 3 (moderate): Secondary | ICD-10-CM | POA: Diagnosis not present

## 2017-10-20 DIAGNOSIS — Z7984 Long term (current) use of oral hypoglycemic drugs: Secondary | ICD-10-CM | POA: Diagnosis not present

## 2017-10-20 DIAGNOSIS — Z0000 Encounter for general adult medical examination without abnormal findings: Secondary | ICD-10-CM | POA: Diagnosis not present

## 2017-10-20 DIAGNOSIS — E782 Mixed hyperlipidemia: Secondary | ICD-10-CM | POA: Diagnosis not present

## 2017-10-20 DIAGNOSIS — Z23 Encounter for immunization: Secondary | ICD-10-CM | POA: Diagnosis not present

## 2017-10-20 DIAGNOSIS — Z1389 Encounter for screening for other disorder: Secondary | ICD-10-CM | POA: Diagnosis not present

## 2017-10-20 DIAGNOSIS — I480 Paroxysmal atrial fibrillation: Secondary | ICD-10-CM | POA: Diagnosis not present

## 2017-10-20 DIAGNOSIS — F09 Unspecified mental disorder due to known physiological condition: Secondary | ICD-10-CM | POA: Diagnosis not present

## 2017-10-20 DIAGNOSIS — I6521 Occlusion and stenosis of right carotid artery: Secondary | ICD-10-CM | POA: Diagnosis not present

## 2017-10-20 DIAGNOSIS — I10 Essential (primary) hypertension: Secondary | ICD-10-CM | POA: Diagnosis not present

## 2017-10-20 DIAGNOSIS — E0590 Thyrotoxicosis, unspecified without thyrotoxic crisis or storm: Secondary | ICD-10-CM | POA: Diagnosis not present

## 2017-10-20 DIAGNOSIS — J439 Emphysema, unspecified: Secondary | ICD-10-CM | POA: Diagnosis not present

## 2017-10-20 DIAGNOSIS — E1122 Type 2 diabetes mellitus with diabetic chronic kidney disease: Secondary | ICD-10-CM | POA: Diagnosis not present

## 2017-10-20 DIAGNOSIS — E7800 Pure hypercholesterolemia, unspecified: Secondary | ICD-10-CM | POA: Diagnosis not present

## 2017-11-17 DIAGNOSIS — E0590 Thyrotoxicosis, unspecified without thyrotoxic crisis or storm: Secondary | ICD-10-CM | POA: Diagnosis not present

## 2017-11-18 ENCOUNTER — Ambulatory Visit: Payer: PPO | Admitting: Neurology

## 2017-11-19 ENCOUNTER — Encounter: Payer: Self-pay | Admitting: Neurology

## 2017-11-19 ENCOUNTER — Ambulatory Visit (INDEPENDENT_AMBULATORY_CARE_PROVIDER_SITE_OTHER): Payer: PPO | Admitting: Neurology

## 2017-11-19 VITALS — BP 161/82 | HR 81 | Ht 68.0 in | Wt 157.0 lb

## 2017-11-19 DIAGNOSIS — Z950 Presence of cardiac pacemaker: Secondary | ICD-10-CM

## 2017-11-19 DIAGNOSIS — F0390 Unspecified dementia without behavioral disturbance: Secondary | ICD-10-CM | POA: Diagnosis not present

## 2017-11-19 MED ORDER — DONEPEZIL HCL 10 MG PO TABS: 10.0000 mg | ORAL_TABLET | Freq: Every day | ORAL | 3 refills | Status: DC

## 2017-11-19 MED ORDER — DONEPEZIL HCL 5 MG PO TABS: 5.0000 mg | ORAL_TABLET | Freq: Every day | ORAL | 0 refills | Status: DC

## 2017-11-19 NOTE — Patient Instructions (Signed)
Donepezil tablets What is this medicine? DONEPEZIL (doe NEP e zil) is used to treat mild to moderate dementia caused by Alzheimer's disease. This medicine may be used for other purposes; ask your health care provider or pharmacist if you have questions. COMMON BRAND NAME(S): Aricept What should I tell my health care provider before I take this medicine? They need to know if you have any of these conditions: -asthma or other lung disease -difficulty passing urine -head injury -heart disease -history of irregular heartbeat -liver disease -seizures (convulsions) -stomach or intestinal disease, ulcers or stomach bleeding -an unusual or allergic reaction to donepezil, other medicines, foods, dyes, or preservatives -pregnant or trying to get pregnant -breast-feeding How should I use this medicine? Take this medicine by mouth with a glass of water. Follow the directions on the prescription label. You may take this medicine with or without food. Take this medicine at regular intervals. This medicine is usually taken before bedtime. Do not take it more often than directed. Continue to take your medicine even if you feel better. Do not stop taking except on your doctor's advice. If you are taking the 23 mg donepezil tablet, swallow it whole; do not cut, crush, or chew it. Talk to your pediatrician regarding the use of this medicine in children. Special care may be needed. Overdosage: If you think you have taken too much of this medicine contact a poison control center or emergency room at once. NOTE: This medicine is only for you. Do not share this medicine with others. What if I miss a dose? If you miss a dose, take it as soon as you can. If it is almost time for your next dose, take only that dose, do not take double or extra doses. What may interact with this medicine? Do not take this medicine with any of the following medications: -certain medicines for fungal infections like itraconazole,  fluconazole, posaconazole, and voriconazole -cisapride -dextromethorphan; quinidine -dofetilide -dronedarone -pimozide -quinidine -thioridazine -ziprasidone This medicine may also interact with the following medications: -antihistamines for allergy, cough and cold -atropine -bethanechol -carbamazepine -certain medicines for bladder problems like oxybutynin, tolterodine -certain medicines for Parkinson's disease like benztropine, trihexyphenidyl -certain medicines for stomach problems like dicyclomine, hyoscyamine -certain medicines for travel sickness like scopolamine -dexamethasone -ipratropium -NSAIDs, medicines for pain and inflammation, like ibuprofen or naproxen -other medicines for Alzheimer's disease -other medicines that prolong the QT interval (cause an abnormal heart rhythm) -phenobarbital -phenytoin -rifampin, rifabutin or rifapentine This list may not describe all possible interactions. Give your health care provider a list of all the medicines, herbs, non-prescription drugs, or dietary supplements you use. Also tell them if you smoke, drink alcohol, or use illegal drugs. Some items may interact with your medicine. What should I watch for while using this medicine? Visit your doctor or health care professional for regular checks on your progress. Check with your doctor or health care professional if your symptoms do not get better or if they get worse. You may get drowsy or dizzy. Do not drive, use machinery, or do anything that needs mental alertness until you know how this drug affects you. What side effects may I notice from receiving this medicine? Side effects that you should report to your doctor or health care professional as soon as possible: -allergic reactions like skin rash, itching or hives, swelling of the face, lips, or tongue -feeling faint or lightheaded, falls -loss of bladder control -seizures -signs and symptoms of a dangerous change in heartbeat or  heart   rhythm like chest pain; dizziness; fast or irregular heartbeat; palpitations; feeling faint or lightheaded, falls; breathing problems -signs and symptoms of infection like fever or chills; cough; sore throat; pain or trouble passing urine -signs and symptoms of liver injury like dark yellow or brown urine; general ill feeling or flu-like symptoms; light-colored stools; loss of appetite; nausea; right upper belly pain; unusually weak or tired; yellowing of the eyes or skin -slow heartbeat or palpitations -unusual bleeding or bruising -vomiting Side effects that usually do not require medical attention (report to your doctor or health care professional if they continue or are bothersome): -diarrhea, especially when starting treatment -headache -loss of appetite -muscle cramps -nausea -stomach upset This list may not describe all possible side effects. Call your doctor for medical advice about side effects. You may report side effects to FDA at 1-800-FDA-1088. Where should I keep my medicine? Keep out of reach of children. Store at room temperature between 15 and 30 degrees C (59 and 86 degrees F). Throw away any unused medicine after the expiration date. NOTE: This sheet is a summary. It may not cover all possible information. If you have questions about this medicine, talk to your doctor, pharmacist, or health care provider.  2018 [employer]vier/Gold Standard (2016-02-19 21:00:42)  

## 2017-11-19 NOTE — Progress Notes (Signed)
Provider:  Larey Seat, M D  Referring Provider:   Allred. Jeneen Rinks, MD   Primary Care Physician:  Seward Carol, MD  Chief Complaint  Patient presents with  . Follow-up    pt with wife. pt states that everything is going well. pt states that still has some difficulty with memory. pt states that he is sleeping well. pt's wife states that he has increased aphasia and abnormal behaviors.    HPI:  Edward Suarez is a 84 y.o. male, who is seen here as a referral from Dr. Delfina Redwood for Memory loss.  Edward Suarez is seen here today as an inpatient referred by Dr. Delfina Redwood, he has a past medical history of allergies (runny nose) and has diabetes and CKD 3 . He had a pacemaker implanted for heart block  in 2005 and a redo in 2013. Mrs. Siever Voiced some concern for memory difficulties and a Mini-Mental Status exam was performed on 05-04-14 and Dr. Bernell List' office. He scored 25 of 30 points.  Multiple metabolic labs were ordered and the patient  had a slightly elevated HbA1c at 6.7 -significant factor for memory loss.  He does have chronic kidney disease is in relation to his diabetes, which Dr. Delfina Redwood graded 3. And he has hyperlipidemia, albeit well controlled. This creatinine level was 1.76. Normal sodium , normal potassium,  cholesterol total was 163 - is well controlled. Vitamin H60  and folic acid were normal. The patient reports his memory loss as a form of absent mindedness. He is a former smoker, quit 15 years ago after 40 plus pack years.   Interval history :  Discuss the results of the recent sleep study dated from 07-20-14. First of all his weight was noted at 200 pounds and is truly 158. Mouth index is a normal range. I would have to correct this on the matrix. His sleep study showed a very poor sleep efficiency meaning that he only slept for 44% of the recorded time. His AHI was 3.3 the RDI was 3.9 in supine sleep position his AHI rose to 5.4 which is still considered normal. Did not have REM  sleep, oxygen nadir was 83% with a total time of desaturation of only 11.2 minutes. His sleep was heavily fragmented however and it was clearly that this was in relation to periodic limb movements the patient had 105 periodic limb movements per hour of sleep and the arousal index was 18.8. The patient has no clinical history of restless leg syndrome so the conclusion is that he either has a spinal stenosis or impinged nerve or circulatory problem involving his lower extremities. He has no history of lower back pain or trouble but he reports cold feet and soles the circulatory component is most likely to be evaluated. Peripheral vascular disease has to be further evaluated, and he is followed by Golden Hurter, MD, his cardiologist.   interval history; [id number], Mr. Hauschild is here today for a revisit ,   not following up on his sleep but on his cognitive function. He performed today a [place]  Cognitive Assessment at 26 out of 30 points which is excellent and normal result.in August 2015 Dr. Lina Sar office, the [facility]. He would not require medication. Only his hearing remains difficuttl  No sleep issues, no PLMs that he is aware of. He reports right neck pain, a tension component is evident.    Interval history: 11-19-2017,  I have the pleasure of meeting with Edward Suarez today,  meanwhile 84 years of age, and I follow him and his wife here in the practice.  Edward Suarez has been followed for memory loss at times he also had some neck pain the last time we met over 2 years ago.  He performed today a Mini-Mental Status Examination for [place] and scored 22 out of 30 points, significant difficulties were only seen in calculation and short recall he recalled only 1 out of 3 words.  This was followed by a [place] but the patient did not complete that test.  There were especially visual spatial difficulties copying an image, drawing a cube at the 3-dimensional object, and following a Trail Making Test. His  wife reports he has word-finding problems, he gets lost when he last drove on familiar roads. His wife has been frustrated with his unwillingness to listen , to pay attention- and to accept help. She has taken over all financial affairs, taxes.   Montreal Cognitive Assessment  10/19/2015  Visuospatial/ Executive (0/5) 5  Naming (0/3) 3  Attention: Read list of digits (0/2) 2  Attention: Read list of letters (0/1) 0  Attention: Serial 7 subtraction starting at 100 (0/3) 3  Language: Repeat phrase (0/2) 2  Language : Fluency (0/1) 1  Abstraction (0/2) 2  Delayed Recall (0/5) 2  Orientation (0/6) 6  Total 26  Adjusted Score (based on education) 26   MMSE - Mini Mental State Exam 11/19/2017  Orientation to time 4  Orientation to Place 4  Registration 3  Attention/ Calculation 2  Attention/Calculation-comments couldnt do the numbers  Recall 1  Language- name 2 objects 2  Language- repeat 1  Language- follow 3 step command 3  Language- read & follow direction 1  Write a sentence 1  Copy design 0  Total score 22      Review of Systems: Out of a complete 14 system review, the patient complains of only the following symptoms, and all other reviewed systems are negative. Short term memory loss.  Tension headaches. cervical and shoulder, has a pacemaker , not an MRI candidate.    Social History   Socioeconomic History  . Marital status: Married    Spouse name: Not on file  . Number of children: 2  . Years of education: BS  . Highest education level: Not on file  Social Needs  . Financial resource strain: Not on file  . Food insecurity - worry: Not on file  . Food insecurity - inability: Not on file  . Transportation needs - medical: Not on file  . Transportation needs - non-medical: Not on file  Occupational History  . Not on file  Tobacco Use  . Smoking status: Former Smoker    Types: Cigarettes    Last attempt to quit: 09/16/1998    Years since quitting: 19.1  .  Smokeless tobacco: Never Used  Substance and Sexual Activity  . Alcohol use: Yes    Alcohol/week: 3.0 oz    Types: 2 Glasses of wine, 3 Shots of liquor per week    Comment: drinks a tequila shot daily  . Drug use: No  . Sexual activity: Not on file  Other Topics Concern  . 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   Patient is married with 2 children.   Patient is right handed.   Patient has BS degree.   Patient drinks 1 cup daily.          Family  History  Problem Relation Age of Onset  . Heart disease Father   . Heart attack Father     Past Medical History:  Diagnosis Date  . Allergic rhinitis, cause unspecified   . BPH (benign prostatic hyperplasia)   . Carotid artery stenosis, asymptomatic    1-39% by dopplers 09/2016 followed by Dr. Oneida Alar  . Cataracts, bilateral   . Chronic kidney disease (CKD), stage III (moderate) (HCC)   . Complete heart block ([place]) 01/23/04   s/p Medtronic PPM implanted by Dr Leonia Reeves  . COPD (chronic obstructive pulmonary disease) ([place])   . Diabetes mellitus   . Glaucoma   . Hyperlipidemia   . Hypertension   . 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 Kidney stones   . PAF (paroxysmal atrial fibrillation) ([place]) 10/09/2014   Noted on pacer check with 88 mode switches and longest episode >1 hour.  Now on Apixiban for Steptoe score of 5    Past Surgical History:  Procedure Laterality Date  . PACEMAKER INSERTION  2005, 04/16/12   MDT implanted by Dr Leonia Reeves with generator chnage (MDT Adapta L) by Dr Rayann Heman 04/16/12  . PERMANENT PACEMAKER GENERATOR CHANGE N/A 04/16/2012   Procedure: PERMANENT PACEMAKER GENERATOR CHANGE;  Surgeon: Thompson Grayer, MD;  Location[facility];  Service: Cardiovascular;  Laterality: N/A;    Current Outpatient Medications  Medication Sig Dispense Refill  . amLODipine (NORVASC) 5 MG tablet TAKE 1 TABLET BY MOUTH EVERY DAY 90 tablet 2  . atorvastatin (LIPITOR) 80 MG  tablet TAKE 1 TABLET (80 MG TOTAL) BY MOUTH EVERY EVENING. 30 tablet 10  . cetirizine (ZYRTEC) 10 MG tablet Take 10 mg by mouth daily. For allergies    . ELIQUIS 5 MG TABS tablet TAKE 1 TABLET BY MOUTH TWICE A DAY 60 tablet 10  . fluticasone (FLONASE) 50 MCG/ACT nasal spray Place 2 sprays into both nostrils daily as needed for allergies or rhinitis.    [name] Kitchen glipiZIDE (GLUCOTROL) 5 MG tablet Take 5 mg by mouth daily before breakfast.      . pioglitazone (ACTOS) 45 MG tablet Take 45 mg by mouth daily.      No current facility-administered medications for this visit.     Allergies as of 11/19/2017 - Review Complete 11/19/2017  Allergen Reaction Noted  . Penicillins Rash 08/10/2011    Vitals: BP (!) 161/82   Pulse 81   Ht 5' 8"  (1.727 m)   Wt 157 lb (71.2 kg)   BMI 23.87 kg/m  Last Weight:  Wt Readings from Last 1 Encounters:  11/19/17 157 lb (71.2 kg)   Last Height:   Ht Readings from Last 1 Encounters:  11/19/17 5' 8"  (1.727 m)    Physical exam:  General: The patient is awake, alert and appears not in acute distress. The patient is well groomed. Head: Normocephalic, atraumatic. Neck is supple. Mallampati is 4   No visible uvula and large tongue, retrognathia.  -16.75 , neck circumference.  Chronic nasal congestion- today, too. Cardiovascular:   iregular rate and rhythm, skipping beats, but without murmurs or carotid bruit, and without distended neck veins. Respiratory: Lungs are clear to auscultation. No wheezing.  Skin:  Without evidence of edema, or rash Trunk: patient has normal posture.  Neurologic exam :The patient is awake and alert, oriented to place and time.  Memory subjective described as intact. There is a normal attention span & concentration ability. Speech is fluent without dysarthria,  dysphonia , not  aphasia. Mood and affect  are appropriate.  Cranial nerves: Pupils are equal and briskly reactive to light. Funduscopic exam without  evidence of pallor or edema.  Extraocular movements  in vertical and horizontal planes intact and without nystagmus. Visual fields by finger perimetry are intact.Hearing to finger rub intact.  Facial sensation intact to fine touch. Facial motor strength- left eye droop, ptosis. Lower face  symmetric and tongue  move midline. He had shingles in the face. Tongue protrusion into either cheek is normal. Shoulder shrug is normal. Tension pain over the left neck and shoulder, paraspinal tension.   Assessment:  After physical and neurologic examination, review of laboratory studies, imaging, neurophysiology testing and pre-existing records, assessment is that of : This patient had 2 years ago signs of  a mild cognitive impairment, MOCA 26-30 , now clearly progressed to early dementia with hallmark symptoms of visual spatial disorientation, sort term memory loss, lack of recall. MMSE now 22-30 .   Plan:  Treatment plan and additional workup :  Early dementia now- living at [facility]  ( the conversion from MCI  to dementia is 7% per year) PAD and carotid follow up pending = I will leave this to Dr Delfina Redwood.  Start Aricept. 5 mg for one month, than 10 mg daily-  both understand the diagnosis and implication of Alzheimer's dementia RV in 6 month, and q 6 month .      Asencion Partridge Stayce Delancy MD   Diplomat of the ABPN, [facility] and accredited by the AASM.  11/19/2017

## 2017-11-19 NOTE — Addendum Note (Signed)
Addended by: Melvyn NovasHMEIER, Duwayne Matters on: 11/19/2017 02:07 PM   Modules accepted: Orders

## 2017-11-20 NOTE — Progress Notes (Signed)
Electrophysiology Office Note Date: 11/21/2017  ID:  Edward RoyalSheldon I Cortopassi, DOB November 16, 1931, MRN [id number]  PCP: Renford DillsPolite, Ronald, MD Primary Cardiologist: Mayford Knifeurner Electrophysiologist: Allred  CC: Pacemaker follow-up  Edward Suarez is a 84 y.o. male seen today for Dr Johney FrameAllred.  He presents today for routine electrophysiology followup.  Since last being seen in our clinic, the patient reports doing very well. [facility] He has recently been started on Aricept by Dr Vickey Hugerohmeier.  He denies chest pain, palpitations, dyspnea, PND, orthopnea, nausea, vomiting, dizziness, syncope, edema, weight gain, or early satiety.  Device History: MDT dual chamber PPM implanted 2005 for complete heart block, gen change 2013   Past Medical History:  Diagnosis Date  . Allergic rhinitis, cause unspecified   . BPH (benign prostatic hyperplasia)   . Carotid artery stenosis, asymptomatic    1-39% by dopplers 09/2016 followed by Dr. Darrick PennaFields  . Cataracts, bilateral   . Chronic kidney disease (CKD), stage III (moderate) (HCC)   . Complete heart block (HCC) 01/23/04   s/p Medtronic PPM implanted by Dr Amil AmenEdmunds  . COPD (chronic obstructive pulmonary disease) (HCC)   . Diabetes mellitus   . Glaucoma   . Hyperlipidemia   . Hypertension   . 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 Kidney stones   . PAF (paroxysmal atrial fibrillation) (HCC) 10/09/2014   Noted on pacer check with 88 mode switches and longest episode >1 hour.  Now on Apixiban for CHADS2VASC score of 5   Past Surgical History:  Procedure Laterality Date  . PACEMAKER INSERTION  2005, 04/16/12   MDT implanted by Dr Amil AmenEdmunds with generator chnage (MDT Adapta L) by Dr Johney FrameAllred 04/16/12  . PERMANENT PACEMAKER GENERATOR CHANGE N/A 04/16/2012   Procedure: PERMANENT PACEMAKER GENERATOR CHANGE;  Surgeon: Hillis RangeJames Allred, MD;  [facility];  Service: Cardiovascular;  Laterality: N/A;    Current Outpatient Medications    Medication Sig Dispense Refill  . amLODipine (NORVASC) 5 MG tablet TAKE 1 TABLET BY MOUTH EVERY DAY 90 tablet 2  . atorvastatin (LIPITOR) 80 MG tablet TAKE 1 TABLET (80 MG TOTAL) BY MOUTH EVERY EVENING. 30 tablet 10  . cetirizine (ZYRTEC) 10 MG tablet Take 10 mg by mouth daily. For allergies    . donepezil (ARICEPT) 5 MG tablet Take 1 tablet (5 mg total) by mouth at bedtime. 30 tablet 0  . ELIQUIS 5 MG TABS tablet TAKE 1 TABLET BY MOUTH TWICE A DAY 60 tablet 10  . fluticasone (FLONASE) 50 MCG/ACT nasal spray Place 2 sprays into both nostrils daily as needed for allergies or rhinitis.    [name] Kitchen. glipiZIDE (GLUCOTROL) 5 MG tablet Take 5 mg by mouth daily before breakfast.      . pioglitazone (ACTOS) 45 MG tablet Take 45 mg by mouth daily.      No current facility-administered medications for this visit.     Allergies:   Penicillins   Social History: Social History   Socioeconomic History  . Marital status: Married    Spouse name: Not on file  . Number of children: 2  . Years of education: BS  . Highest education level: Not on file  Social Needs  . Financial resource strain: Not on file  . Food insecurity - worry: Not on file  . Food insecurity - inability: Not on file  . Transportation needs - medical: Not on file  . Transportation needs - non-medical: Not on file  Occupational  History  . Not on file  Tobacco Use  . Smoking status: Former Smoker    Types: Cigarettes    Last attempt to quit: 09/16/1998    Years since quitting: 19.1  . Smokeless tobacco: Never Used  Substance and Sexual Activity  . Alcohol use: Yes    Alcohol/week: 3.0 oz    Types: 2 Glasses of wine, 3 Shots of liquor per week    Comment: drinks a tequila shot daily  . Drug use: No  . Sexual activity: Not on file  Other Topics Concern  . 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   Patient is married with 2 children.   Patient is right handed.   Patient has BS  degree.   Patient drinks 1 cup daily.          Family History: Family History  Problem Relation Age of Onset  . Heart disease Father   . Heart attack Father      Review of Systems: All other systems reviewed and are otherwise negative except as noted above.   Physical Exam: VS:  BP (!) 160/68   Pulse 88   Ht 5\' 8"  (1.727 m)   Wt 159 lb 3.2 oz (72.2 kg)   BMI 24.21 kg/m  , BMI Body mass index is 24.21 kg/m.  GEN- The patient is elderly appearing, alert and oriented x 3 today.   HEENT: normocephalic, atraumatic; sclera clear, conjunctiva pink; hearing intact; oropharynx clear; neck supple Lungs- Clear to ausculation bilaterally, normal work of breathing.  No wheezes, rales, rhonchi Heart- Regular rate and rhythm (paced) GI- soft, non-tender, non-distended, bowel sounds present Extremities- no clubbing, cyanosis, or edema; DP/PT/radial pulses 2+ bilaterally MS- no significant deformity or atrophy Skin- warm and dry, no rash or lesion; PPM pocket well healed Psych- euthymic mood, full affect Neuro- strength and sensation are intact  PPM Interrogation- reviewed in detail today,  See PACEART report  EKG:  EKG is not ordered today.  Recent Labs: 12/16/2016: ALT 16 05/14/2017: Hemoglobin 14.2; Platelets 249 06/12/2017: BUN 21; Creatinine, Ser 1.42; Potassium 4.3; Sodium 143   Wt Readings from Last 3 Encounters:  11/21/17 159 lb 3.2 oz (72.2 kg)  11/19/17 157 lb (71.2 kg)  05/14/17 157 lb 12.8 oz (71.6 kg)     Other studies Reviewed: Additional studies/ records that were reviewed today include: Dr Johney Frame and Dr Norris Cross office notes  Assessment and Plan:  1.  Complete heart block  Normal PPM function - pt is device dependent today  See Pace Art report No changes today  2.  Paroxysmal atrial fibrillation Burden by device interrogation today <0.1% Continue Eliquis for CHADS2VASC of 6   BMET, CBC today   3.  NSVT Asymptomatic  No changes today  4.   HTN Stable No change required today   Current medicines are reviewed at length with the patient today.   The patient does not have concerns regarding his medicines.  The following changes were made today:  none  Labs/ tests ordered today include: BMET, CBC   Disposition:   Follow up with [facility] transmissions, me in 1 year, Dr Mayford Knife as scheduled     Signed, Gypsy Balsam, NP 11/21/2017 9:36 AM  [facility] [address] [place] [address] [phone] (office) [phone] (fax)

## 2017-11-21 ENCOUNTER — Ambulatory Visit: Payer: PPO | Admitting: Nurse Practitioner

## 2017-11-21 ENCOUNTER — Encounter: Payer: Self-pay | Admitting: Nurse Practitioner

## 2017-11-21 ENCOUNTER — Ambulatory Visit
Admission: RE | Admit: 2017-11-21 | Discharge: 2017-11-21 | Disposition: A | Discharge status: Home / Self Care | Payer: PPO | Source: Ambulatory Visit | Attending: Neurology | Admitting: Neurology

## 2017-11-21 VITALS — BP 160/68 | HR 88 | Ht 68.0 in | Wt 159.2 lb

## 2017-11-21 DIAGNOSIS — F0390 Unspecified dementia without behavioral disturbance: Secondary | ICD-10-CM

## 2017-11-21 DIAGNOSIS — I472 Ventricular tachycardia: Secondary | ICD-10-CM | POA: Diagnosis not present

## 2017-11-21 DIAGNOSIS — I442 Atrioventricular block, complete: Secondary | ICD-10-CM | POA: Diagnosis not present

## 2017-11-21 DIAGNOSIS — I10 Essential (primary) hypertension: Secondary | ICD-10-CM

## 2017-11-21 DIAGNOSIS — Z950 Presence of cardiac pacemaker: Secondary | ICD-10-CM

## 2017-11-21 DIAGNOSIS — I480 Paroxysmal atrial fibrillation: Secondary | ICD-10-CM

## 2017-11-21 DIAGNOSIS — I4729 Other ventricular tachycardia: Secondary | ICD-10-CM

## 2017-11-21 DIAGNOSIS — R4182 Altered mental status, unspecified: Secondary | ICD-10-CM | POA: Diagnosis not present

## 2017-11-21 LAB — CUP PACEART INCLINIC DEVICE CHECK
Date Time Interrogation Session: 20190308092902
Implantable Lead Implant Date: 20050509
Implantable Lead Implant Date: 20050509
Implantable Lead Location: 753859
Implantable Lead Location: 753860
Implantable Lead Model: 5076
Implantable Lead Model: 5092
Implantable Pulse Generator Implant Date: 20130801

## 2017-11-21 LAB — CBC
Hematocrit: 43.5 % (ref 37.5–51.0)
Hemoglobin: 14 g/dL (ref 13.0–17.7)
MCH: 26.6 pg (ref 26.6–33.0)
MCHC: 32.2 g/dL (ref 31.5–35.7)
MCV: 83 fL (ref 79–97)
Platelets: 277 10*3/uL (ref 150–379)
RBC: 5.27 x10E6/uL (ref 4.14–5.80)
RDW: 16.6 % — ABNORMAL HIGH (ref 12.3–15.4)
WBC: 5.4 10*3/uL (ref 3.4–10.8)

## 2017-11-21 LAB — BASIC METABOLIC PANEL
BUN/Creatinine Ratio: 17 (ref 10–24)
BUN: 23 mg/dL (ref 8–27)
CO2: 23 mmol/L (ref 20–29)
Calcium: 8.9 mg/dL (ref 8.6–10.2)
Chloride: 103 mmol/L (ref 96–106)
Creatinine, Ser: 1.36 mg/dL — ABNORMAL HIGH (ref 0.76–1.27)
GFR calc Af Amer: 55 mL/min/{1.73_m2} — ABNORMAL LOW (ref >59)
GFR calc non Af Amer: 48 mL/min/{1.73_m2} — ABNORMAL LOW (ref >59)
Glucose: 318 mg/dL — ABNORMAL HIGH (ref 65–99)
Potassium: 4.3 mmol/L (ref 3.5–5.2)
Sodium: 140 mmol/L (ref 134–144)

## 2017-11-21 NOTE — Patient Instructions (Addendum)
Medication Instructions:   Your physician recommends that you continue on your current medications as directed. Please refer to the Current Medication list given to you today.   If you need a refill on your cardiac medications before your next appointment, please call your pharmacy.  Labwork:  BMET AND CBC TODAY     Testing/Procedures: NONE ORDERED  TODAY    Follow-Up:   Your physician wants you to follow-up in: ONE YEAR WITH SEILER   You will receive a reminder letter in the mail two months in advance. If you don't receive a letter, please call our office to schedule the follow-up appointment.   Remote monitoring is used to monitor your Pacemaker of ICD from home. This monitoring reduces the number of office visits required to check your device to one time per year. It allows us to keep an eye on the functioning of your device to ensure it is working properly. You are scheduled for a device check from home on .  3-21-19You may send your transmission at any time that day. If you have a wireless device, the transmission will be sent automatically. After your physician reviews your transmission, you will receive a postcard with your next transmission date.     Any Other Special Instructions Will Be Listed Below (If Applicable).

## 2017-11-24 ENCOUNTER — Telehealth: Payer: Self-pay | Admitting: Nurse Practitioner

## 2017-11-24 NOTE — Telephone Encounter (Signed)
Follow Up:    Returning a call from Friday,concerning his lab results.

## 2017-11-26 NOTE — Telephone Encounter (Signed)
Follow Up ° °Pt returning call for lab results. Please call °

## 2017-11-26 NOTE — Telephone Encounter (Signed)
Reviewed lab results with patient's wife, Marylouise StacksWilma (per DPR). She verbalized understanding and thanked me for the call.

## 2017-12-02 ENCOUNTER — Other Ambulatory Visit: Payer: Self-pay | Admitting: Neurology

## 2017-12-04 ENCOUNTER — Ambulatory Visit (INDEPENDENT_AMBULATORY_CARE_PROVIDER_SITE_OTHER): Payer: PPO | Admitting: *Deleted

## 2017-12-04 DIAGNOSIS — I442 Atrioventricular block, complete: Secondary | ICD-10-CM

## 2017-12-04 NOTE — Progress Notes (Signed)
Remote pacemaker transmission.   

## 2017-12-05 ENCOUNTER — Encounter: Payer: Self-pay | Admitting: Cardiology

## 2017-12-10 DIAGNOSIS — E0590 Thyrotoxicosis, unspecified without thyrotoxic crisis or storm: Secondary | ICD-10-CM | POA: Diagnosis not present

## 2017-12-11 ENCOUNTER — Encounter: Payer: Self-pay | Admitting: Cardiology

## 2017-12-11 ENCOUNTER — Ambulatory Visit: Payer: PPO | Admitting: Cardiology

## 2017-12-11 VITALS — BP 136/72 | HR 83 | Ht 68.0 in | Wt 156.2 lb

## 2017-12-11 DIAGNOSIS — I6523 Occlusion and stenosis of bilateral carotid arteries: Secondary | ICD-10-CM | POA: Diagnosis not present

## 2017-12-11 DIAGNOSIS — I493 Ventricular premature depolarization: Secondary | ICD-10-CM | POA: Diagnosis not present

## 2017-12-11 DIAGNOSIS — I442 Atrioventricular block, complete: Secondary | ICD-10-CM | POA: Diagnosis not present

## 2017-12-11 DIAGNOSIS — I10 Essential (primary) hypertension: Secondary | ICD-10-CM | POA: Diagnosis not present

## 2017-12-11 DIAGNOSIS — I480 Paroxysmal atrial fibrillation: Secondary | ICD-10-CM | POA: Diagnosis not present

## 2017-12-11 NOTE — Progress Notes (Signed)
Cardiology Office Note:    Date:  12/11/2017   ID:  Edward Suarez, DOB 19-Jan-1932, MRN [id number]  PCP:  Renford DillsPolite, Ronald, MD  Cardiologist:  No primary care provider on file.    Referring MD: Renford DillsPolite, Ronald, MD   Chief Complaint  Patient presents with  . Follow-up    PVCs, complete heart block, PAF    History of Present Illness:    Edward Suarez is a 84 y.o. male with a hx of HTN, DM, dyslipidemia, complete HB s/p PPM and PVC's, mild (1-39% bilateral)carotid artery stenosis and is followed by Dr. Darrick PennaFields. He also has  PAF noted on pacer check in 2016 and [facility], was started on Eliquis.  He is here today for followup and is doing well.  He denies any chest pain or pressure, SOB, DOE, PND, orthopnea, LE edema, dizziness, palpitations or syncope. He is compliant with his meds and is tolerating meds with no SE.    Past Medical History:  Diagnosis Date  . Allergic rhinitis, cause unspecified   . BPH (benign prostatic hyperplasia)   . Carotid artery stenosis, asymptomatic    1-39% by dopplers 09/2016 followed by Dr. Darrick PennaFields  . Cataracts, bilateral   . Chronic kidney disease (CKD), stage III (moderate) (HCC)   . Complete heart block (HCC) 01/23/04   s/p Medtronic PPM implanted by Dr Amil AmenEdmunds  . COPD (chronic obstructive pulmonary disease) (HCC)   . Diabetes mellitus   . Glaucoma   . Hyperlipidemia   . Hypertension   . 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 Kidney stones   . PAF (paroxysmal atrial fibrillation) (HCC) 10/09/2014   Noted on pacer check with 88 mode switches and longest episode >1 hour.  Now on Apixiban for CHADS2VASC score of 5    Past Surgical History:  Procedure Laterality Date  . PACEMAKER INSERTION  2005, 04/16/12   MDT implanted by Dr Amil AmenEdmunds with generator chnage (MDT Adapta L) by Dr Johney FrameAllred 04/16/12  . PERMANENT PACEMAKER GENERATOR CHANGE N/A 04/16/2012   Procedure: PERMANENT PACEMAKER GENERATOR CHANGE;  Surgeon: Hillis RangeJames Allred, MD;   [facility];  Service: Cardiovascular;  Laterality: N/A;    Current Medications: Current Meds  Medication Sig  . amLODipine (NORVASC) 5 MG tablet TAKE 1 TABLET BY MOUTH EVERY DAY  . atorvastatin (LIPITOR) 80 MG tablet TAKE 1 TABLET (80 MG TOTAL) BY MOUTH EVERY EVENING.  . cetirizine (ZYRTEC) 10 MG tablet Take 10 mg by mouth daily. For allergies  . donepezil (ARICEPT) 5 MG tablet Take 1 tablet (5 mg total) by mouth at bedtime.  [name] Kitchen. ELIQUIS 5 MG TABS tablet TAKE 1 TABLET BY MOUTH TWICE A DAY  . fluticasone (FLONASE) 50 MCG/ACT nasal spray Place 2 sprays into both nostrils daily as needed for allergies or rhinitis.  [name] Kitchen. glipiZIDE (GLUCOTROL) 5 MG tablet Take 5 mg by mouth daily before breakfast.    . pioglitazone (ACTOS) 45 MG tablet Take 45 mg by mouth daily.      Allergies:   Penicillins   Social History   Socioeconomic History  . Marital status: Married    Spouse name: Not on file  . Number of children: 2  . Years of education: BS  . Highest education level: Not on file  Occupational History  . Not on file  Social Needs  . Financial resource strain: Not on file  . Food insecurity:    Worry: Not on file    Inability:  Not on file  . Transportation needs:    Medical: Not on file    Non-medical: Not on file  Tobacco Use  . Smoking status: Former Smoker    Types: Cigarettes    Last attempt to quit: 09/16/1998    Years since quitting: 19.2  . Smokeless tobacco: Never Used  Substance and Sexual Activity  . Alcohol use: Yes    Alcohol/week: 3.0 oz    Types: 2 Glasses of wine, 3 Shots of liquor per week    Comment: drinks a tequila shot daily  . Drug use: No  . Sexual activity: Not on file  Lifestyle  . Physical activity:    Days per week: Not on file    Minutes per session: Not on file  . Stress: Not on file  Relationships  . Social connections:    Talks on phone: Not on file    Gets together: Not on file    Attends religious service: Not on file    Active  member of club or organization: Not on file    Attends meetings of clubs or organizations: Not on file    Relationship status: Not on file  Other Topics Concern  . 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   Patient is married with 2 children.   Patient is right handed.   Patient has BS degree.   Patient drinks 1 cup daily.           Family History: The patient's family history includes Heart attack in his father; Heart disease in his father.  ROS:   Please see the history of present illness.    ROS  All other systems reviewed and negative.   EKGs/Labs/Other Studies Reviewed:    The following studies were reviewed today: none  EKG:  EKG is ordered today showing AV paced rhythm  Recent Labs: 12/16/2016: ALT 16 11/21/2017: BUN 23; Creatinine, Ser 1.36; Hemoglobin 14.0; Platelets 277; Potassium 4.3; Sodium 140   Recent Lipid Panel    Component Value Date/Time   CHOL 128 12/16/2016 0822   TRIG 74 12/16/2016 0822   HDL 55 12/16/2016 0822   CHOLHDL 2.3 12/16/2016 0822   CHOLHDL 2.7 07/08/2016 0802   VLDL 22 07/08/2016 0802   LDLCALC 58 12/16/2016 0822    Physical Exam:    VS:  BP 136/72   Pulse 83   Ht 5\' 8"  (1.727 m)   Wt 156 lb 3.2 oz (70.9 kg)   BMI 23.75 kg/m     Wt Readings from Last 3 Encounters:  12/11/17 156 lb 3.2 oz (70.9 kg)  11/21/17 159 lb 3.2 oz (72.2 kg)  11/19/17 157 lb (71.2 kg)     GEN:  Well nourished, well developed in no acute distress HEENT: Normal NECK: No JVD; No carotid bruits LYMPHATICS: No lymphadenopathy CARDIAC: RRR, no murmurs, rubs, gallops RESPIRATORY:  Clear to auscultation without rales, wheezing or rhonchi  ABDOMEN: Soft, non-tender, non-distended MUSCULOSKELETAL:  No edema; No deformity  SKIN: Warm and dry NEUROLOGIC:  Alert and oriented x 3 PSYCHIATRIC:  Normal affect   ASSESSMENT:    1. PAF (paroxysmal atrial fibrillation) (HCC)   2. PVC (premature ventricular  contraction)   3. Essential hypertension   4. Atrioventricular block, complete (HCC)   5. Carotid stenosis, bilateral    PLAN:    In order of problems listed above:  1.  Paroxysmal atrial fibrillation -he is maintaining normal sinus rhythm  on exam today.  He will continue on Eliquis 5 mg twice daily.  His creatinine was stable at 1.36 on 11/21/2017.  Hemoglobin was stable at 16 on 10/20/2017.  He denies any bleeding problems.  2.  PVC - these are asymptomatic.  3.  Hypertension -blood pressures well controlled on exam today.  He will continue on amlodipine 5 mg daily.  4.  Complete heart block status post permanent pacemaker placement.  He is followed in our device clinic.  5.  Bilateral carotid artery stenosis.  This is stable with 1-39% bilateral carotid stenosis by Doppler a 1 year ago.  [facility]  He will continue on statin therapy.  6.  Hyperlipidemia with LDL goal < 70.  His last LDL was 63 on 10/20/2017.  He will continue atorvastatin 80 mg daily.     Medication Adjustments/Labs and Tests Ordered: Current medicines are reviewed at length with the patient today.  Concerns regarding medicines are outlined above.  No orders of the defined types were placed in this encounter.  No orders of the defined types were placed in this encounter.   Signed, Armanda Magic, MD  12/11/2017 2:18 PM    [facility]

## 2017-12-11 NOTE — Patient Instructions (Signed)
Medication Instructions:  Your physician recommends that you continue on your current medications as directed. Please refer to the Current Medication list given to you today.  If you need a refill on your cardiac medications, please contact your pharmacy first.  Labwork: None ordered   Testing/Procedures: None ordered   Follow-Up: Your physician wants you to follow-up in: 6 months with Dr. Turner. You will receive a reminder letter in the mail two months in advance. If you don't receive a letter, please call our office to schedule the follow-up appointment.  Any Other Special Instructions Will Be Listed Below (If Applicable).   Thank you for choosing [facility]    Rena Delayna Sparlin, RN  [phone]  If you need a refill on your cardiac medications before your next appointment, please call your pharmacy.   

## 2017-12-19 ENCOUNTER — Telehealth: Payer: Self-pay | Admitting: Neurology

## 2017-12-19 NOTE — Telephone Encounter (Signed)
Called the patient's wife and apolagized for us not reaching out sooner to give these results. I informed her that I had Dr Dohmeier look over his imaging and she states that it only shows age related changes but there was no acute changes to the scan. The patient will continue current treatment plan and will follow up with Butch Pennymegan millikan in September. Pt's wife verbalized understanding and had no concerns or questions.

## 2017-12-19 NOTE — Telephone Encounter (Signed)
Pt wife(on DPR from 05-09-2016) has called asking for results of Brain scan from over a month ago

## 2017-12-19 NOTE — Telephone Encounter (Signed)
The CT scan shows age related progression, no acute changes, strokes, tumors, etc.  Read as mainly unchnged in comparison to last head CT from 2016.   Edward Novasarmen Sabiha Sura, MD

## 2017-12-26 LAB — CUP PACEART REMOTE DEVICE CHECK
Battery Impedance: 730 Ohm
Battery Remaining Longevity: 49 mo
Battery Voltage: 2.77 V
Brady Statistic AP VP Percent: 96 %
Brady Statistic AP VS Percent: 0 %
Brady Statistic AS VP Percent: 3 %
Brady Statistic AS VS Percent: 0 %
Date Time Interrogation Session: 20190321135615
Implantable Lead Implant Date: 20050509
Implantable Lead Implant Date: 20050509
Implantable Lead Location: 753859
Implantable Lead Location: 753860
Implantable Lead Model: 5076
Implantable Lead Model: 5092
Implantable Pulse Generator Implant Date: 20130801
Lead Channel Impedance Value: 384 Ohm
Lead Channel Impedance Value: 509 Ohm
Lead Channel Pacing Threshold Amplitude: 0.75 V
Lead Channel Pacing Threshold Amplitude: 1.25 V
Lead Channel Pacing Threshold Pulse Width: 0.4 ms
Lead Channel Pacing Threshold Pulse Width: 0.4 ms
Lead Channel Setting Pacing Amplitude: 2 V
Lead Channel Setting Pacing Amplitude: 3.25 V
Lead Channel Setting Pacing Pulse Width: 0.46 ms
Lead Channel Setting Sensing Sensitivity: 4 mV

## 2017-12-30 DIAGNOSIS — E119 Type 2 diabetes mellitus without complications: Secondary | ICD-10-CM | POA: Diagnosis not present

## 2018-01-03 DIAGNOSIS — L259 Unspecified contact dermatitis, unspecified cause: Secondary | ICD-10-CM | POA: Diagnosis not present

## 2018-01-03 DIAGNOSIS — S90121A Contusion of right lesser toe(s) without damage to nail, initial encounter: Secondary | ICD-10-CM | POA: Diagnosis not present

## 2018-01-12 ENCOUNTER — Other Ambulatory Visit: Payer: Self-pay | Admitting: Neurology

## 2018-01-21 DIAGNOSIS — Z7984 Long term (current) use of oral hypoglycemic drugs: Secondary | ICD-10-CM | POA: Diagnosis not present

## 2018-01-21 DIAGNOSIS — E039 Hypothyroidism, unspecified: Secondary | ICD-10-CM | POA: Diagnosis not present

## 2018-01-21 DIAGNOSIS — I480 Paroxysmal atrial fibrillation: Secondary | ICD-10-CM | POA: Diagnosis not present

## 2018-01-21 DIAGNOSIS — E782 Mixed hyperlipidemia: Secondary | ICD-10-CM | POA: Diagnosis not present

## 2018-01-21 DIAGNOSIS — E1122 Type 2 diabetes mellitus with diabetic chronic kidney disease: Secondary | ICD-10-CM | POA: Diagnosis not present

## 2018-01-21 DIAGNOSIS — J439 Emphysema, unspecified: Secondary | ICD-10-CM | POA: Diagnosis not present

## 2018-01-21 DIAGNOSIS — N183 Chronic kidney disease, stage 3 (moderate): Secondary | ICD-10-CM | POA: Diagnosis not present

## 2018-01-21 DIAGNOSIS — I10 Essential (primary) hypertension: Secondary | ICD-10-CM | POA: Diagnosis not present

## 2018-01-23 DIAGNOSIS — E0590 Thyrotoxicosis, unspecified without thyrotoxic crisis or storm: Secondary | ICD-10-CM | POA: Diagnosis not present

## 2018-02-26 DIAGNOSIS — I480 Paroxysmal atrial fibrillation: Secondary | ICD-10-CM | POA: Diagnosis not present

## 2018-02-26 DIAGNOSIS — Z7984 Long term (current) use of oral hypoglycemic drugs: Secondary | ICD-10-CM | POA: Diagnosis not present

## 2018-02-26 DIAGNOSIS — E7800 Pure hypercholesterolemia, unspecified: Secondary | ICD-10-CM | POA: Diagnosis not present

## 2018-02-26 DIAGNOSIS — F0390 Unspecified dementia without behavioral disturbance: Secondary | ICD-10-CM | POA: Diagnosis not present

## 2018-02-26 DIAGNOSIS — E1122 Type 2 diabetes mellitus with diabetic chronic kidney disease: Secondary | ICD-10-CM | POA: Diagnosis not present

## 2018-02-26 DIAGNOSIS — E0590 Thyrotoxicosis, unspecified without thyrotoxic crisis or storm: Secondary | ICD-10-CM | POA: Diagnosis not present

## 2018-02-26 DIAGNOSIS — I10 Essential (primary) hypertension: Secondary | ICD-10-CM | POA: Diagnosis not present

## 2018-03-05 ENCOUNTER — Ambulatory Visit (INDEPENDENT_AMBULATORY_CARE_PROVIDER_SITE_OTHER): Payer: PPO | Admitting: *Deleted

## 2018-03-05 DIAGNOSIS — I442 Atrioventricular block, complete: Secondary | ICD-10-CM

## 2018-03-06 ENCOUNTER — Encounter: Payer: Self-pay | Admitting: Cardiology

## 2018-03-06 LAB — CUP PACEART REMOTE DEVICE CHECK
Battery Impedance: 885 Ohm
Battery Remaining Longevity: 49 mo
Battery Voltage: 2.78 V
Brady Statistic AP VP Percent: 95 %
Brady Statistic AP VS Percent: 0 %
Brady Statistic AS VP Percent: 4 %
Brady Statistic AS VS Percent: 0 %
Date Time Interrogation Session: 20190620172515
Implantable Lead Implant Date: 20050509
Implantable Lead Implant Date: 20050509
Implantable Lead Location: 753859
Implantable Lead Location: 753860
Implantable Lead Model: 5076
Implantable Lead Model: 5092
Implantable Pulse Generator Implant Date: 20130801
Lead Channel Impedance Value: 384 Ohm
Lead Channel Impedance Value: 532 Ohm
Lead Channel Pacing Threshold Amplitude: 0.5 V
Lead Channel Pacing Threshold Amplitude: 1.125 V
Lead Channel Pacing Threshold Pulse Width: 0.4 ms
Lead Channel Pacing Threshold Pulse Width: 0.4 ms
Lead Channel Setting Pacing Amplitude: 2 V
Lead Channel Setting Pacing Amplitude: 2.75 V
Lead Channel Setting Pacing Pulse Width: 0.4 ms
Lead Channel Setting Sensing Sensitivity: 4 mV

## 2018-03-06 NOTE — Progress Notes (Signed)
Remote pacemaker transmission.   

## 2018-03-12 DIAGNOSIS — F0390 Unspecified dementia without behavioral disturbance: Secondary | ICD-10-CM | POA: Diagnosis not present

## 2018-03-12 DIAGNOSIS — E782 Mixed hyperlipidemia: Secondary | ICD-10-CM | POA: Diagnosis not present

## 2018-03-12 DIAGNOSIS — N183 Chronic kidney disease, stage 3 (moderate): Secondary | ICD-10-CM | POA: Diagnosis not present

## 2018-03-12 DIAGNOSIS — Z7984 Long term (current) use of oral hypoglycemic drugs: Secondary | ICD-10-CM | POA: Diagnosis not present

## 2018-03-12 DIAGNOSIS — E1122 Type 2 diabetes mellitus with diabetic chronic kidney disease: Secondary | ICD-10-CM | POA: Diagnosis not present

## 2018-03-12 DIAGNOSIS — J439 Emphysema, unspecified: Secondary | ICD-10-CM | POA: Diagnosis not present

## 2018-03-12 DIAGNOSIS — I10 Essential (primary) hypertension: Secondary | ICD-10-CM | POA: Diagnosis not present

## 2018-03-12 DIAGNOSIS — I480 Paroxysmal atrial fibrillation: Secondary | ICD-10-CM | POA: Diagnosis not present

## 2018-03-12 DIAGNOSIS — E039 Hypothyroidism, unspecified: Secondary | ICD-10-CM | POA: Diagnosis not present

## 2018-04-06 ENCOUNTER — Other Ambulatory Visit: Payer: Self-pay | Admitting: Cardiology

## 2018-04-20 DIAGNOSIS — E7800 Pure hypercholesterolemia, unspecified: Secondary | ICD-10-CM | POA: Diagnosis not present

## 2018-04-20 DIAGNOSIS — E039 Hypothyroidism, unspecified: Secondary | ICD-10-CM | POA: Diagnosis not present

## 2018-04-20 DIAGNOSIS — Z7189 Other specified counseling: Secondary | ICD-10-CM | POA: Diagnosis not present

## 2018-04-20 DIAGNOSIS — F0390 Unspecified dementia without behavioral disturbance: Secondary | ICD-10-CM | POA: Diagnosis not present

## 2018-04-20 DIAGNOSIS — N183 Chronic kidney disease, stage 3 (moderate): Secondary | ICD-10-CM | POA: Diagnosis not present

## 2018-04-20 DIAGNOSIS — I480 Paroxysmal atrial fibrillation: Secondary | ICD-10-CM | POA: Diagnosis not present

## 2018-04-20 DIAGNOSIS — E1122 Type 2 diabetes mellitus with diabetic chronic kidney disease: Secondary | ICD-10-CM | POA: Diagnosis not present

## 2018-04-20 DIAGNOSIS — I10 Essential (primary) hypertension: Secondary | ICD-10-CM | POA: Diagnosis not present

## 2018-04-20 DIAGNOSIS — Z7984 Long term (current) use of oral hypoglycemic drugs: Secondary | ICD-10-CM | POA: Diagnosis not present

## 2018-04-20 DIAGNOSIS — I6521 Occlusion and stenosis of right carotid artery: Secondary | ICD-10-CM | POA: Diagnosis not present

## 2018-05-17 ENCOUNTER — Other Ambulatory Visit: Payer: Self-pay | Admitting: Neurology

## 2018-06-01 ENCOUNTER — Encounter: Payer: Self-pay | Admitting: Adult Health

## 2018-06-01 ENCOUNTER — Ambulatory Visit (INDEPENDENT_AMBULATORY_CARE_PROVIDER_SITE_OTHER): Payer: PPO | Admitting: Adult Health

## 2018-06-01 VITALS — BP 137/81 | HR 80 | Ht 68.0 in | Wt 157.8 lb

## 2018-06-01 DIAGNOSIS — R413 Other amnesia: Secondary | ICD-10-CM | POA: Diagnosis not present

## 2018-06-01 NOTE — Patient Instructions (Signed)
Your Plan:  Continue Aricept Memory score has slightly declined. We will continue to monitor If your symptoms worsen or you develop new symptoms please let us know.   Thank you for coming [facility] at [facility]. I hope we have been able to provide you high quality care today.  You may receive a patient satisfaction survey over the next few weeks. We would appreciate your feedback and comments so that we may continue to improve ourselves and the health of our patients.  Memantine Tablets What is this medicine? MEMANTINE (MEM an teen) is used to treat dementia caused by Alzheimer's disease. This medicine may be used for other purposes; ask your health care provider or pharmacist if you have questions. COMMON BRAND NAME(S): Namenda What should I tell my health care provider before I take this medicine? They need to know if you have any of these conditions: -difficulty passing urine -kidney disease -liver disease -seizures -an unusual or allergic reaction to memantine, other medicines, foods, dyes, or preservatives -pregnant or trying to get pregnant -breast-feeding How should I use this medicine? Take this medicine by mouth with a glass of water. Follow the directions on the prescription label. You may take this medicine with or without food. Take your doses at regular intervals. Do not take your medicine more often than directed. Continue to take your medicine even if you feel better. Do not stop taking except on the advice of your doctor or health care professional. Talk to your pediatrician regarding the use of this medicine in children. Special care may be needed. Overdosage: If you think you have taken too much of this medicine contact a poison control center or emergency room at once. NOTE: This medicine is only for you. Do not share this medicine with others. What if I miss a dose? If you miss a dose, take it as soon as you can. If it is almost time for your  next dose, take only that dose. Do not take double or extra doses. If you do not take your medicine for several days, contact your health care provider. Your dose may need to be changed. What may interact with this medicine? -acetazolamide -amantadine -cimetidine -dextromethorphan -dofetilide -hydrochlorothiazide -ketamine -metformin -methazolamide -quinidine -ranitidine -sodium bicarbonate -triamterene This list may not describe all possible interactions. Give your health care provider a list of all the medicines, herbs, non-prescription drugs, or dietary supplements you use. Also tell them if you smoke, drink alcohol, or use illegal drugs. Some items may interact with your medicine. What should I watch for while using this medicine? Visit your doctor or health care professional for regular checks on your progress. Check with your doctor or health care professional if there is no improvement in your symptoms or if they get worse. You may get drowsy or dizzy. Do not drive, use machinery, or do anything that needs mental alertness until you know how this drug affects you. Do not stand or sit up quickly, especially if you are an older patient. This reduces the risk of dizzy or fainting spells. Alcohol can make you more drowsy and dizzy. Avoid alcoholic drinks. What side effects may I notice from receiving this medicine? Side effects that you should report to your doctor or health care professional as soon as possible: -allergic reactions like skin rash, itching or hives, swelling of the face, lips, or tongue -agitation or a feeling of restlessness -depressed mood -dizziness -hallucinations -redness, blistering, peeling or loosening of the skin, including inside  the mouth -seizures -vomiting Side effects that usually do not require medical attention (report to your doctor or health care professional if they continue or are bothersome): -constipation -diarrhea -headache -nausea -trouble  sleeping This list may not describe all possible side effects. Call your doctor for medical advice about side effects. You may report side effects to FDA at 1-800-FDA-1088. Where should I keep my medicine? Keep out of the reach of children. Store at room temperature between 15 degrees and 30 degrees C (59 degrees and 86 degrees F). Throw away any unused medicine after the expiration date. NOTE: This sheet is a summary. It may not cover all possible information. If you have questions about this medicine, talk to your doctor, pharmacist, or health care provider.  2018 Elsevier/Gold Standard (2013-06-21 14:10:42)

## 2018-06-01 NOTE — Progress Notes (Signed)
PATIENT: Edward Suarez DOB: 1932-01-08  REASON FOR VISIT: follow up- memory loss HISTORY FROM: patient  HISTORY OF PRESENT ILLNESS: Today 06/01/18: [name] is an 84 year old male with a history of memory loss. He returns today for follow-up.  He says that his memory has remained stable.  He is able to complete all ADLs independently.  He operates a Librarian, academic without difficulty.  He reports that he does use his GPS to avoid getting lost.  Reports good appetite.  Denies any trouble sleeping.  Denies any changes in his mood or behavior.  He remains on Aricept.  He returns today for evaluation.  HISTORY 11/19/17: Edward Suarez is a 84 y.o. male, who is seen here as a referral from Dr. Nehemiah Settle for Memory loss.  Edward Suarez is seen here today as an inpatient referred by Dr. Nehemiah Settle, he has a past medical history of allergies (runny nose) and has diabetes and CKD 3 . He had a pacemaker implanted for heart block  in 2005 and a redo in 2013. Edward Suarez Voiced some concern for memory difficulties and a Mini-Mental Status exam was performed on 05-04-14 and Dr. Lynnette Caffey' office. He scored 25 of 30 points.  Multiple metabolic labs were ordered and the patient  had a slightly elevated HbA1c at 6.7 -significant factor for memory loss.  He does have chronic kidney disease is in relation to his diabetes, which Dr. Nehemiah Settle graded 3. And he has hyperlipidemia, albeit well controlled. This creatinine level was 1.76. Normal sodium , normal potassium,  cholesterol total was 163 - is well controlled. Vitamin B12  and folic acid were normal. The patient reports his memory loss as a form of absent mindedness. He is a former smoker, quit 15 years ago after 40 plus pack years.    REVIEW OF SYSTEMS: Out of a complete 14 system review of symptoms, the patient complains only of the following symptoms, and all other reviewed systems are negative.   see HPI  ALLERGIES: Allergies  Allergen Reactions  . Penicillins Rash     HOME MEDICATIONS: Outpatient Medications Prior to Visit  Medication Sig Dispense Refill  . amLODipine (NORVASC) 5 MG tablet TAKE 1 TABLET BY MOUTH EVERY DAY 90 tablet 2  . atorvastatin (LIPITOR) 80 MG tablet TAKE 1 TABLET (80 MG TOTAL) BY MOUTH EVERY EVENING. 90 tablet 2  . cetirizine (ZYRTEC) 10 MG tablet Take 10 mg by mouth daily. For allergies    . donepezil (ARICEPT) 10 MG tablet TAKE 1 TABLET BY MOUTH AT BEDTIME 90 tablet 2  . ELIQUIS 5 MG TABS tablet TAKE 1 TABLET BY MOUTH TWICE A DAY 60 tablet 10  . fluticasone (FLONASE) 50 MCG/ACT nasal spray Place 2 sprays into both nostrils daily as needed for allergies or rhinitis.    [name] Kitchen glipiZIDE (GLUCOTROL) 5 MG tablet Take 5 mg by mouth daily before breakfast.      . levothyroxine (SYNTHROID, LEVOTHROID) 50 MCG tablet 50 mcg.  3  . pioglitazone (ACTOS) 45 MG tablet Take 45 mg by mouth daily.     [name] Kitchen donepezil (ARICEPT) 5 MG tablet TAKE 1 TABLET BY MOUTH AT BEDTIME 30 tablet 5   No facility-administered medications prior to visit.     PAST MEDICAL HISTORY: Past Medical History:  Diagnosis Date  . Allergic rhinitis, cause unspecified   . BPH (benign prostatic hyperplasia)   . Carotid artery stenosis, asymptomatic    1-39% by dopplers 09/2016 followed by Dr. Darrick Penna  . Cataracts,  bilateral   . Chronic kidney disease (CKD), stage III (moderate) (HCC)   . Complete heart block (HCC) 01/23/04   s/p Medtronic PPM implanted by Dr Amil Amen  . COPD (chronic obstructive pulmonary disease) (HCC)   . Diabetes mellitus   . Glaucoma   . Hyperlipidemia   . Hypertension   . 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 Kidney stones   . PAF (paroxysmal atrial fibrillation) (HCC) 10/09/2014   Noted on pacer check with 88 mode switches and longest episode >1 hour.  Now on Apixiban for CHADS2VASC score of 5    PAST SURGICAL HISTORY: Past Surgical History:  Procedure Laterality Date  . PACEMAKER INSERTION  2005, 04/16/12   MDT implanted by Dr Amil Amen  with generator chnage (MDT Adapta L) by Dr Johney Frame 04/16/12  . PERMANENT PACEMAKER GENERATOR CHANGE N/A 04/16/2012   Procedure: PERMANENT PACEMAKER GENERATOR CHANGE;  Surgeon: Hillis Range, MD;  Location[facility] CATH LAB;  Service: Cardiovascular;  Laterality: N/A;    FAMILY HISTORY: Family History  Problem Relation Age of Onset  . Heart disease Father   . Heart attack Father     SOCIAL HISTORY: Social History   Socioeconomic History  . Marital status: Married    Spouse name: Edward Suarez  . Number of children: 2  . Years of education: BS  . Highest education level: Not on file  Occupational History  . Not on file  Social Needs  . Financial resource strain: Not on file  . Food insecurity:    Worry: Not on file    Inability: Not on file  . Transportation needs:    Medical: Not on file    Non-medical: Not on file  Tobacco Use  . Smoking status: Former Smoker    Types: Cigarettes    Last attempt to quit: 09/16/1998    Years since quitting: 19.7  . Smokeless tobacco: Never Used  Substance and Sexual Activity  . Alcohol use: Yes    Alcohol/week: 5.0 standard drinks    Types: 2 Glasses of wine, 3 Shots of liquor per week    Comment: drinks a tequila shot daily  . Drug use: No  . Sexual activity: Not on file  Lifestyle  . Physical activity:    Days per week: Not on file    Minutes per session: Not on file  . Stress: Not on file  Relationships  . Social connections:    Talks on phone: Not on file    Gets together: Not on file    Attends religious service: Not on file    Active member of club or organization: Not on file    Attends meetings of clubs or organizations: Not on file    Relationship status: Not on file  . Intimate partner violence:    Fear of current or ex partner: Not on file    Emotionally abused: Not on file    Physically abused: Not on file    Forced sexual activity: Not on file  Other Topics Concern  . Not on file  Social History Narrative   Retired Scientist, clinical (histocompatibility and immunogenetics).  Lives in [place].   Patient is married with 2 children.   Patient is right handed.   Patient has BS degree.   Patient drinks 1 cup daily.            PHYSICAL EXAM  Vitals:   06/01/18 1339  BP: 137/81  Pulse: 80  Weight: 157 lb 12.8 oz (71.6 kg)  Height:  5\' 8"  (1.727 m)   Body mass index is 23.99 kg/m.   MMSE - Mini Mental State Exam 06/01/2018 11/19/2017  Orientation to time 3 4  Orientation to Place 4 4  Registration 3 3  Attention/ Calculation 1 2  Attention/Calculation-comments - couldnt do the numbers  Recall 0 1  Language- name 2 objects 2 2  Language- repeat 1 1  Language- follow 3 step command 2 3  Language- follow 3 step command-comments used left hand -  Language- read & follow direction 1 1  Write a sentence 1 1  Copy design 0 0  Total score 18 22     Generalized: Well developed, in no acute distress   Neurological examination  Mentation: Alert oriented to time, place, history taking. Follows all commands speech and language fluent Cranial nerve II-XII: Pupils were equal round reactive to light. Extraocular movements were full, visual field were full on confrontational test. Facial sensation and strength were normal. Uvula tongue midline. Head turning and shoulder shrug  were normal and symmetric. Motor: The motor testing reveals 5 over 5 strength of all 4 extremities. Good symmetric motor tone is noted throughout.  Sensory: Sensory testing is intact to soft touch on all 4 extremities. No evidence of extinction is noted.  Coordination: Cerebellar testing reveals good finger-nose-finger and heel-to-shin bilaterally.  Gait and station: Gait is normal.  Reflexes: Deep tendon reflexes are symmetric and normal bilaterally.   DIAGNOSTIC DATA (LABS, IMAGING, TESTING) - I reviewed patient records, labs, notes, testing and imaging myself where available.  Lab Results  Component Value Date   WBC 5.4 11/21/2017   HGB 14.0 11/21/2017   HCT 43.5  11/21/2017   MCV 83 11/21/2017   PLT 277 11/21/2017      Component Value Date/Time   NA 140 11/21/2017 0942   K 4.3 11/21/2017 0942   CL 103 11/21/2017 0942   CO2 23 11/21/2017 0942   GLUCOSE 318 (H) 11/21/2017 0942   GLUCOSE 170 (H) 04/06/2016 0945   BUN 23 11/21/2017 0942   CREATININE 1.36 (H) 11/21/2017 0942   CALCIUM 8.9 11/21/2017 0942   PROT 5.9 (L) 12/16/2016 0822   ALBUMIN 4.3 12/16/2016 0822   AST 21 12/16/2016 0822   ALT 16 12/16/2016 0822   ALKPHOS 61 12/16/2016 0822   BILITOT 0.3 12/16/2016 0822   GFRNONAA 48 (L) 11/21/2017 0942   GFRAA 55 (L) 11/21/2017 0942   Lab Results  Component Value Date   CHOL 128 12/16/2016   HDL 55 12/16/2016   LDLCALC 58 12/16/2016   TRIG 74 12/16/2016   CHOLHDL 2.3 12/16/2016      ASSESSMENT AND PLAN 84 y.o. year old male  has a past medical history of Allergic rhinitis, cause unspecified, BPH (benign prostatic hyperplasia), Carotid artery stenosis, asymptomatic, Cataracts, bilateral, Chronic kidney disease (CKD), stage III (moderate) (HCC), Complete heart block (HCC) (01/23/04), COPD (chronic obstructive pulmonary disease) (HCC), Diabetes mellitus, Glaucoma, Hyperlipidemia, Hypertension, Insomnia due to medical condition (08/10/2014), Kidney stones, and PAF (paroxysmal atrial fibrillation) (HCC) (10/09/2014). here with :  1.  Memory disturbance  The patient's memory score has declined slightly.  He will continue on Aricept.  We discussed adding on Namenda however the patient deferred until the next visit.  I have advised that if his symptoms worsen or he develops new symptoms he should let us know.  He will follow-up in 6 months or sooner if needed.   I spent 15 minutes with the patient. 50% of this time was  spent reviewing his memory score  Butch PennyMegan Caitriona Sundquist, MSN, NP-C 06/01/2018, 1:47 PM [facility] [address], [address] [place], [place] [address] [phone]

## 2018-06-02 ENCOUNTER — Telehealth: Payer: Self-pay | Admitting: Adult Health

## 2018-06-02 MED ORDER — MEMANTINE HCL 28 X 5 MG & 21 X 10 MG PO TABS: ORAL_TABLET | ORAL | 0 refills | Status: DC

## 2018-06-02 NOTE — Addendum Note (Signed)
Addended by: Enedina FinnerMILLIKAN, Larayne Baxley P on: 06/02/2018 05:12 PM   Modules accepted: Orders

## 2018-06-02 NOTE — Telephone Encounter (Signed)
Pts [place]ouise [place] stating the pt would like to start Namenda, requesting medication sent to [facility]

## 2018-06-02 NOTE — Telephone Encounter (Signed)
Namenda titration pack sent in.  Please advise the patient and his wife that they will need to call when they begin week 4 so we can  send in the maintenance dose.

## 2018-06-02 NOTE — Telephone Encounter (Signed)
Spoke with wife and informed her that NP sent in new prescription for namenda. Advised her the instructions to titrate will be on prescription, but she needs to call this RN in 3 weeks to let us know how he is doing. Also so that the NP can prescribe the maintenance dose. She verbalized understanding, appreciation.

## 2018-06-04 ENCOUNTER — Telehealth: Payer: Self-pay | Admitting: Cardiology

## 2018-06-04 ENCOUNTER — Ambulatory Visit (INDEPENDENT_AMBULATORY_CARE_PROVIDER_SITE_OTHER): Payer: PPO | Admitting: *Deleted

## 2018-06-04 DIAGNOSIS — I495 Sick sinus syndrome: Secondary | ICD-10-CM

## 2018-06-04 DIAGNOSIS — I442 Atrioventricular block, complete: Secondary | ICD-10-CM | POA: Diagnosis not present

## 2018-06-04 NOTE — Progress Notes (Signed)
Remote pacemaker transmission.   

## 2018-06-04 NOTE — Telephone Encounter (Signed)
Spoke with pt and reminded pt of remote transmission that is due today. Pt verbalized understanding.   

## 2018-06-10 DIAGNOSIS — Z23 Encounter for immunization: Secondary | ICD-10-CM | POA: Diagnosis not present

## 2018-06-12 ENCOUNTER — Ambulatory Visit: Payer: PPO | Admitting: Cardiology

## 2018-06-12 DIAGNOSIS — E1122 Type 2 diabetes mellitus with diabetic chronic kidney disease: Secondary | ICD-10-CM | POA: Diagnosis not present

## 2018-06-12 DIAGNOSIS — F0390 Unspecified dementia without behavioral disturbance: Secondary | ICD-10-CM | POA: Diagnosis not present

## 2018-06-12 DIAGNOSIS — J439 Emphysema, unspecified: Secondary | ICD-10-CM | POA: Diagnosis not present

## 2018-06-12 DIAGNOSIS — E039 Hypothyroidism, unspecified: Secondary | ICD-10-CM | POA: Diagnosis not present

## 2018-06-12 DIAGNOSIS — N183 Chronic kidney disease, stage 3 (moderate): Secondary | ICD-10-CM | POA: Diagnosis not present

## 2018-06-12 DIAGNOSIS — E782 Mixed hyperlipidemia: Secondary | ICD-10-CM | POA: Diagnosis not present

## 2018-06-12 DIAGNOSIS — I480 Paroxysmal atrial fibrillation: Secondary | ICD-10-CM | POA: Diagnosis not present

## 2018-06-12 DIAGNOSIS — I10 Essential (primary) hypertension: Secondary | ICD-10-CM | POA: Diagnosis not present

## 2018-06-15 ENCOUNTER — Ambulatory Visit: Payer: PPO | Admitting: Cardiology

## 2018-06-15 VITALS — BP 126/72 | HR 79 | Ht 68.0 in | Wt 159.6 lb

## 2018-06-15 DIAGNOSIS — E7800 Pure hypercholesterolemia, unspecified: Secondary | ICD-10-CM | POA: Diagnosis not present

## 2018-06-15 DIAGNOSIS — I442 Atrioventricular block, complete: Secondary | ICD-10-CM | POA: Diagnosis not present

## 2018-06-15 DIAGNOSIS — I6523 Occlusion and stenosis of bilateral carotid arteries: Secondary | ICD-10-CM

## 2018-06-15 DIAGNOSIS — I10 Essential (primary) hypertension: Secondary | ICD-10-CM | POA: Diagnosis not present

## 2018-06-15 DIAGNOSIS — I480 Paroxysmal atrial fibrillation: Secondary | ICD-10-CM

## 2018-06-15 LAB — BASIC METABOLIC PANEL
BUN/Creatinine Ratio: 13 (ref 10–24)
BUN: 20 mg/dL (ref 8–27)
CO2: 24 mmol/L (ref 20–29)
Calcium: 9.2 mg/dL (ref 8.6–10.2)
Chloride: 103 mmol/L (ref 96–106)
Creatinine, Ser: 1.58 mg/dL — ABNORMAL HIGH (ref 0.76–1.27)
GFR calc Af Amer: 46 mL/min/{1.73_m2} — ABNORMAL LOW (ref >59)
GFR calc non Af Amer: 40 mL/min/{1.73_m2} — ABNORMAL LOW (ref >59)
Glucose: 142 mg/dL — ABNORMAL HIGH (ref 65–99)
Potassium: 4.2 mmol/L (ref 3.5–5.2)
Sodium: 142 mmol/L (ref 134–144)

## 2018-06-15 NOTE — Progress Notes (Signed)
Cardiology Office Note:    Date:  06/15/2018   ID:  Edward Suarez, DOB Dec 10, 1931, MRN [id number]  PCP:  Renford Dills, MD  Cardiologist:  Armanda Magic, MD    Referring MD: Renford Dills, MD   Chief Complaint  Patient presents with  . Hypertension  . Hyperlipidemia    History of Present Illness:    Edward Suarez is a 84 y.o. male with a hx of  HTN, DM, dyslipidemia,complete HB s/p PPM and PVC's, mild (1-39% bilateral)carotid artery stenosis and is followed by Dr. Darrick Penna.He also has PAF noted on pacer check in 2016 and due to [facility] score of 5, was started on Eliquis.  He is here today for followup and is doing well.  He denies any chest pain or pressure, SOB, DOE, PND, orthopnea, LE edema, dizziness, palpitations or syncope. He is compliant with his meds and is tolerating meds with no SE.    Past Medical History:  Diagnosis Date  . Allergic rhinitis, cause unspecified   . BPH (benign prostatic hyperplasia)   . Carotid artery stenosis, asymptomatic    1-39% by dopplers 09/2016 followed by Dr. Darrick Penna  . Cataracts, bilateral   . Chronic kidney disease (CKD), stage III (moderate) (HCC)   . Complete heart block (HCC) 01/23/04   s/p Medtronic PPM implanted by Dr Amil Amen  . COPD (chronic obstructive pulmonary disease) (HCC)   . Diabetes mellitus   . Glaucoma   . Hyperlipidemia   . Hypertension   . 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 Kidney stones   . PAF (paroxysmal atrial fibrillation) (HCC) 10/09/2014   Noted on pacer check with 88 mode switches and longest episode >1 hour.  Now on Apixiban for CHADS2VASC score of 5    Past Surgical History:  Procedure Laterality Date  . PACEMAKER INSERTION  2005, 04/16/12   MDT implanted by Dr Amil Amen with generator chnage (MDT Adapta L) by Dr Johney Frame 04/16/12  . PERMANENT PACEMAKER GENERATOR CHANGE N/A 04/16/2012   Procedure: PERMANENT PACEMAKER GENERATOR CHANGE;  Surgeon: Hillis Range, MD;  Location: [facility] CATH LAB;  Service:  Cardiovascular;  Laterality: N/A;    Current Medications: Current Meds  Medication Sig  . amLODipine (NORVASC) 5 MG tablet TAKE 1 TABLET BY MOUTH EVERY DAY  . atorvastatin (LIPITOR) 80 MG tablet TAKE 1 TABLET (80 MG TOTAL) BY MOUTH EVERY EVENING.  . cetirizine (ZYRTEC) 10 MG tablet Take 10 mg by mouth daily. For allergies  . donepezil (ARICEPT) 10 MG tablet TAKE 1 TABLET BY MOUTH AT BEDTIME  . ELIQUIS 5 MG TABS tablet TAKE 1 TABLET BY MOUTH TWICE A DAY  . fluticasone (FLONASE) 50 MCG/ACT nasal spray Place 2 sprays into both nostrils daily as needed for allergies or rhinitis.  [name] Kitchen glipiZIDE (GLUCOTROL) 5 MG tablet Take 5 mg by mouth daily before breakfast.    . levothyroxine (SYNTHROID, LEVOTHROID) 50 MCG tablet Take 50 mcg by mouth daily before breakfast.   . memantine (NAMENDA TITRATION PAK) tablet pack 5 mg/day for =1 week; 5 mg twice daily for =1 week; 15 mg/day given in 5 mg and 10 mg separated doses for =1 week; then 10 mg twice daily  . pioglitazone (ACTOS) 45 MG tablet Take 45 mg by mouth daily.      Allergies:   Penicillins   Social History   Socioeconomic History  . Marital status: Married    Spouse name: [name]  . Number of children: 2  . Years of education: BS  .  Highest education level: Not on file  Occupational History  . Not on file  Social Needs  . Financial resource strain: Not on file  . Food insecurity:    Worry: Not on file    Inability: Not on file  . Transportation needs:    Medical: Not on file    Non-medical: Not on file  Tobacco Use  . Smoking status: Former Smoker    Types: Cigarettes    Last attempt to quit: 09/16/1998    Years since quitting: 19.7  . Smokeless tobacco: Never Used  Substance and Sexual Activity  . Alcohol use: Yes    Alcohol/week: 5.0 standard drinks    Types: 2 Glasses of wine, 3 Shots of liquor per week    Comment: drinks a tequila shot daily  . Drug use: No  . Sexual activity: Not on file  Lifestyle  . Physical activity:     Days per week: Not on file    Minutes per session: Not on file  . Stress: Not on file  Relationships  . Social connections:    Talks on phone: Not on file    Gets together: Not on file    Attends religious service: Not on file    Active member of club or organization: Not on file    Attends meetings of clubs or organizations: Not on file    Relationship status: Not on file  Other Topics Concern  . Not on file  Social History Narrative   Retired Mudlogger.  Lives in [place].   Patient is married with 2 children.   Patient is right handed.   Patient has BS degree.   Patient drinks 1 cup daily.           Family History: The patient's family history includes Heart attack in his father; Heart disease in his father.  ROS:   Please see the history of present illness.    ROS  All other systems reviewed and negative.   EKGs/Labs/Other Studies Reviewed:    The following studies were reviewed today: none  EKG is not done today.   11/21/2017: BUN 23; Creatinine, Ser 1.36; Hemoglobin 14.0; Platelets 277; Potassium 4.3; Sodium 140   Recent Lipid Panel    Component Value Date/Time   CHOL 128 12/16/2016 0822   TRIG 74 12/16/2016 0822   HDL 55 12/16/2016 0822   CHOLHDL 2.3 12/16/2016 0822   CHOLHDL 2.7 07/08/2016 0802   VLDL 22 07/08/2016 0802   LDLCALC 58 12/16/2016 0822    Physical Exam:    VS:  BP 126/72   Pulse 79   Ht 5\' 8"  (1.727 m)   Wt 159 lb 9.6 oz (72.4 kg)   SpO2 94%   BMI 24.27 kg/m     Wt Readings from Last 3 Encounters:  06/15/18 159 lb 9.6 oz (72.4 kg)  06/01/18 157 lb 12.8 oz (71.6 kg)  12/11/17 156 lb 3.2 oz (70.9 kg)     GEN:  Well nourished, well developed in no acute distress HEENT: Normal NECK: No JVD; No carotid bruits LYMPHATICS: No lymphadenopathy CARDIAC: RRR, no murmurs, rubs, gallops RESPIRATORY:  Clear to auscultation without rales, wheezing or rhonchi  ABDOMEN: Soft, non-tender, non-distended MUSCULOSKELETAL:  No edema;  No deformity  SKIN: Warm and dry NEUROLOGIC:  Alert and oriented x 3 PSYCHIATRIC:  Normal affect   ASSESSMENT:    1. Carotid stenosis, bilateral   2. Atrioventricular block, complete (HCC)   3. Essential hypertension  4. PAF (paroxysmal atrial fibrillation) (HCC)   5. Hypercholesterolemia without hypertriglyceridemia    PLAN:    In order of problems listed above:  1.  Bilateral carotid artery stenosis - Dopplers 10/16/2016 showed 1 - 39% stenosis.  I will repeat Dopplers in January 2020.  Continue statin.  He is not on aspirin due to DOAC.  2.  Complete HB - status post permanent pacemaker.  He is followed in our device clinic.  3.  HTN -BP is well controlled on current medication.  He will continue on amlodipine 5 mg daily.  4.  PAF-he has maintain normal sinus rhythm on exam today.  He will continue on Eliquis 5 mg twice daily.  His creatinine was 1.5 and potassium 4 on 04/20/2018.  His hemoglobin was 14.5.  I will recheck a bmet today and if creatinine still greater than 1.5 we will need to decrease Eliquis to 2.5 mg twice daily given his age greater than 48 and creatinine greater than 1.5.  5.  Hyperlipidemia - LDL goal is less than 70.  LDL was 69 and ALT 1708 01/2018.  He will continue on atorvastatin 80 mg daily.   Medication Adjustments/Labs and Tests Ordered: Current medicines are reviewed at length with the patient today.  Concerns regarding medicines are outlined above.  Orders Placed This Encounter  Procedures  . Basic metabolic panel   No orders of the defined types were placed in this encounter.   Signed, Armanda Magic, MD  06/15/2018 8:16 AM    [facility]

## 2018-06-15 NOTE — Patient Instructions (Signed)
Medication Instructions:  Your physician recommends that you continue on your current medications as directed. Please refer to the Current Medication list given to you today.   Labwork: Today: BMET  Testing/Procedures: Your physician has requested that you have a carotid duplex around 09/2018. This test is an ultrasound of the carotid arteries in your neck. It looks at blood flow through these arteries that supply the brain with blood. Allow one hour for this exam. There are no restrictions or special instructions.   Follow-Up: Your physician wants you to follow-up in: 6 months with Dr. Mayford Knife. You will receive a reminder letter in the mail two months in advance. If you don't receive a letter, please call our office to schedule the follow-up appointment.  If you need a refill on your cardiac medications before your next appointment, please call your pharmacy.

## 2018-06-16 ENCOUNTER — Telehealth: Payer: Self-pay

## 2018-06-16 MED ORDER — APIXABAN 2.5 MG PO TABS: 2.5000 mg | ORAL_TABLET | Freq: Two times a day (BID) | ORAL | 3 refills | Status: DC

## 2018-06-16 NOTE — Telephone Encounter (Signed)
-----   Message from Quintella Reichert, MD sent at 06/15/2018  8:17 PM EDT ----- Creatinine > 1.5 and age > 32.  Please decrease Eliquis to 2.5mg  BID.

## 2018-06-16 NOTE — Telephone Encounter (Signed)
Spoke with the patient, he accepted the medication change and had no further questions.

## 2018-06-17 LAB — CUP PACEART REMOTE DEVICE CHECK
Battery Impedance: 963 Ohm
Battery Remaining Longevity: 51 mo
Battery Voltage: 2.77 V
Brady Statistic AP VP Percent: 95 %
Brady Statistic AP VS Percent: 0 %
Brady Statistic AS VP Percent: 5 %
Brady Statistic AS VS Percent: 0 %
Date Time Interrogation Session: 20190919153031
Implantable Lead Implant Date: 20050509
Implantable Lead Implant Date: 20050509
Implantable Lead Location: 753859
Implantable Lead Location: 753860
Implantable Lead Model: 5076
Implantable Lead Model: 5092
Implantable Pulse Generator Implant Date: 20130801
Lead Channel Impedance Value: 398 Ohm
Lead Channel Impedance Value: 648 Ohm
Lead Channel Pacing Threshold Amplitude: 0.625 V
Lead Channel Pacing Threshold Amplitude: 0.875 V
Lead Channel Pacing Threshold Pulse Width: 0.4 ms
Lead Channel Pacing Threshold Pulse Width: 0.4 ms
Lead Channel Setting Pacing Amplitude: 2 V
Lead Channel Setting Pacing Amplitude: 2.5 V
Lead Channel Setting Pacing Pulse Width: 0.46 ms
Lead Channel Setting Sensing Sensitivity: 4 mV

## 2018-06-26 ENCOUNTER — Other Ambulatory Visit: Payer: Self-pay | Admitting: Neurology

## 2018-06-26 ENCOUNTER — Other Ambulatory Visit: Payer: Self-pay | Admitting: Adult Health

## 2018-06-26 MED ORDER — MEMANTINE HCL 10 MG PO TABS: 10.0000 mg | ORAL_TABLET | Freq: Two times a day (BID) | ORAL | 1 refills | Status: DC

## 2018-06-29 NOTE — Telephone Encounter (Signed)
Patient's wife [name] (on [place]) calling regarding memantine (NAMENDA) 10 MG tablet. Patient is doing well on medication but shows some sleepiness, increased nasal allergy symptoms. Does patient need to go ahead and increase dosage?

## 2018-06-30 DIAGNOSIS — E119 Type 2 diabetes mellitus without complications: Secondary | ICD-10-CM | POA: Diagnosis not present

## 2018-06-30 DIAGNOSIS — H40013 Open angle with borderline findings, low risk, bilateral: Secondary | ICD-10-CM | POA: Diagnosis not present

## 2018-06-30 MED ORDER — MEMANTINE HCL 5 MG PO TABS: 5.0000 mg | ORAL_TABLET | Freq: Two times a day (BID) | ORAL | 0 refills | Status: DC

## 2018-06-30 NOTE — Addendum Note (Signed)
Addended by: Enedina Finner on: 06/30/2018 02:32 PM   Modules accepted: Orders

## 2018-06-30 NOTE — Telephone Encounter (Signed)
What dose is he at in titration pack. He can stay at that dose for a couple of weeks and see if symptoms improve or resolve. namenda can sometimes cause drowsiness. Nasal symptoms is not a typical side effect.

## 2018-06-30 NOTE — Telephone Encounter (Signed)
Returned call to wife and advised her of NP's message. Also advised her that his nasal symptoms are most likely not related to namenda. She agreed and stated she knew those were seasonal allergy symptoms. She stated he only has 4 tabs of namenda 5 mg. He is taking 5 mg nightly. She stated she will need new Rx for 5 mg tabs to give him for the next 2 weeks. They are now using [facility], [place]. She restated that he will take Namenda 5 mg nightly x 2 more weeks to see if drowsiness resolves. She agreed to call back and let this RN know how he is doing and to get further instructions from [place] at that time. She thanked this Charity fundraiser for the prompt call and clear information, verbalized understanding.

## 2018-06-30 NOTE — Telephone Encounter (Signed)
LVM for wife requesting call back to discuss her questions.

## 2018-06-30 NOTE — Telephone Encounter (Signed)
Pts wife returning RNs call  °

## 2018-07-09 ENCOUNTER — Telehealth: Payer: Self-pay | Admitting: Diagnostic Neuroimaging

## 2018-07-09 MED ORDER — MEMANTINE HCL 5 MG PO TABS: 5.0000 mg | ORAL_TABLET | Freq: Two times a day (BID) | ORAL | 2 refills | Status: DC

## 2018-07-09 NOTE — Telephone Encounter (Signed)
Patient states that Edward Suarez requested for them to call her back and make her aware of how the patient responded to the new medication (Namenta). She states that has been working very well and would like to speak with Edward Suarez to know if he should continue it. Please call and advise.

## 2018-07-09 NOTE — Telephone Encounter (Signed)
Spoke with wife who stated that her husband has tolerated namenda 5 mg well. She has not been giving it to him twice daily and is asking if she should increase now. This RN advised her to increase to 50 mg twice daily per NP's last refill. She requested addiational refills; advised will send them in. Advised she call for any questions, concerns. She verbalized understanding, appreciation.

## 2018-07-10 DIAGNOSIS — I480 Paroxysmal atrial fibrillation: Secondary | ICD-10-CM | POA: Diagnosis not present

## 2018-07-10 DIAGNOSIS — I10 Essential (primary) hypertension: Secondary | ICD-10-CM | POA: Diagnosis not present

## 2018-07-10 DIAGNOSIS — N183 Chronic kidney disease, stage 3 (moderate): Secondary | ICD-10-CM | POA: Diagnosis not present

## 2018-07-10 DIAGNOSIS — E782 Mixed hyperlipidemia: Secondary | ICD-10-CM | POA: Diagnosis not present

## 2018-07-10 DIAGNOSIS — F0390 Unspecified dementia without behavioral disturbance: Secondary | ICD-10-CM | POA: Diagnosis not present

## 2018-07-10 DIAGNOSIS — J439 Emphysema, unspecified: Secondary | ICD-10-CM | POA: Diagnosis not present

## 2018-07-10 DIAGNOSIS — E1122 Type 2 diabetes mellitus with diabetic chronic kidney disease: Secondary | ICD-10-CM | POA: Diagnosis not present

## 2018-07-10 DIAGNOSIS — E039 Hypothyroidism, unspecified: Secondary | ICD-10-CM | POA: Diagnosis not present

## 2018-07-15 NOTE — Telephone Encounter (Signed)
Patients wife called and requested to speak with the nurse regarding the patients dementia and the progression of it. Please call and advise.

## 2018-07-15 NOTE — Telephone Encounter (Signed)
I spoke to pt and relayed that per below, his MMSE in moderate range.  Hard to say how quickly it may progress, hopefully namenda will help with slowing down progression.   She appreciated call and information.  I reminded about dementia caregiver resources.  She will try to get tomorrow.

## 2018-07-15 NOTE — Telephone Encounter (Signed)
I called pt's wife, [name], per DPR. She reports that her insurance may be changing for the next year and needs to get a sense of how quickly pt's dementia is progressing because she needs to know if pt will need SNF care in the next year. Pt's wife reports that the namenda is working well for the pt. Pt is still driving and has a good attitude. Pt's wife reports that she understood that pt's MMSE score declined slightly from the previous visit and wants to make an informed decision regarding the best insurance for them. If it is anticipated that pt will need SNF care in the next year for his dementia, she would like to know.   Pt's wife also asked about a packet of information on caregiver support and dementia resources. I advised her that I can put a packet of this information available for her to pick up at the front desk. Pt's wife verbalized appreciation.  Pt's wife is asking for a call back with Megan's thoughts on pt's dementia progression.

## 2018-07-15 NOTE — Telephone Encounter (Signed)
According to MMSE he is scoring in the moderate range for dementia. Hard to say how quickly this might progress. The hope is that Namenda will slow down progression.

## 2018-07-20 ENCOUNTER — Telehealth: Payer: Self-pay | Admitting: Adult Health

## 2018-07-20 NOTE — Telephone Encounter (Signed)
Pt wife (on [name]) is asking for a call from RN Corrie Dandy C re: pt's memantine (NAMENDA) 5 MG tablet, wife states pt is doing well on dose.  Wife would like a call to discuss increasing dosage.  Please call

## 2018-07-20 NOTE — Telephone Encounter (Signed)
Called wife to verify how often the patient is taking memantine. She stated he is taking it once daily. She stated she has no refills. This RN advised her of new prescription sent to [facility] on 07/09/18 which had 2 additional refills. Advised she call [facility] and request refill, begin giving patient 5 mg twice a day for 1 -2 weeks. Advised she then call back to check on increase. She stated she has 10 mg tabs from titration pack and wants to use them when it is time. She verbalized understanding appreciation of call.

## 2018-07-20 NOTE — Telephone Encounter (Signed)
She kept him at 5 mg nightly for two additional weeks because of potential side effects (see phone note). If he is doing well. Then dose can be increase to 5 mg twice a day.

## 2018-08-03 NOTE — Telephone Encounter (Signed)
Called wife who stated her husband has taken all 5 mg namenda tabs. He has been taking 5 mg twice daily the past 2 weeks., as instructed. She did noticed some lethargy but wants to continue with his titration. The titration package states to begin taking 10 mg morning, and 5 mg every evening  x 1 week. She stated he is out of 5 mg tabs. This RN advised her that the Rx sent on 07/09/18 had 2 refills; she has only refilled it once. This RN advised she call pharmacy to get 2nd refill so she can continue giving patient dose per titration instructions. Advised she call back for any questions or concerns. She verbalized understanding, appreciation for this RN's prompt reply.

## 2018-08-03 NOTE — Telephone Encounter (Signed)
Patient's wife Edward Suarez calling to discuss titration dosage of Namenda.

## 2018-08-06 DIAGNOSIS — N183 Chronic kidney disease, stage 3 (moderate): Secondary | ICD-10-CM | POA: Diagnosis not present

## 2018-08-06 DIAGNOSIS — J439 Emphysema, unspecified: Secondary | ICD-10-CM | POA: Diagnosis not present

## 2018-08-06 DIAGNOSIS — E039 Hypothyroidism, unspecified: Secondary | ICD-10-CM | POA: Diagnosis not present

## 2018-08-06 DIAGNOSIS — I10 Essential (primary) hypertension: Secondary | ICD-10-CM | POA: Diagnosis not present

## 2018-08-06 DIAGNOSIS — E782 Mixed hyperlipidemia: Secondary | ICD-10-CM | POA: Diagnosis not present

## 2018-08-06 DIAGNOSIS — E1122 Type 2 diabetes mellitus with diabetic chronic kidney disease: Secondary | ICD-10-CM | POA: Diagnosis not present

## 2018-08-06 DIAGNOSIS — F0390 Unspecified dementia without behavioral disturbance: Secondary | ICD-10-CM | POA: Diagnosis not present

## 2018-08-06 DIAGNOSIS — I480 Paroxysmal atrial fibrillation: Secondary | ICD-10-CM | POA: Diagnosis not present

## 2018-08-20 DIAGNOSIS — M545 Low back pain: Secondary | ICD-10-CM | POA: Diagnosis not present

## 2018-08-25 MED ORDER — MEMANTINE HCL 5 MG PO TABS: ORAL_TABLET | ORAL | 1 refills | Status: DC

## 2018-08-25 NOTE — Addendum Note (Signed)
Addended by: Guy BeginYOUNG, SANDRA S on: 08/25/2018 10:09 AM   Modules accepted: Orders

## 2018-08-25 NOTE — Telephone Encounter (Signed)
Pts wife called stating the pt is currently taking 15 MG of namenda 10MG  in morning and 5MG  in evening. [name] stating this amount seems to work well with the pt. Requesting an rx for 15MG s sent to [facility]

## 2018-08-25 NOTE — Addendum Note (Signed)
Addended by: Enedina FinnerMILLIKAN, Eneida Evers P on: 08/25/2018 11:14 AM   Modules accepted: Orders

## 2018-08-25 NOTE — Telephone Encounter (Signed)
Ok to take this way but if not having side effects the recommended dosing is 10 mg BID.

## 2018-08-25 NOTE — Telephone Encounter (Signed)
I spoke to wife.  She stated that pt is currently taking memantine 10mg  po AM and 5 mg po PM.  They would like to continue this dosing, knowing that 10mg  po bid is maintenance. This is (due to speaking to other caregivers of pts with dementia, they have noted that after awhile the medication will stop working.  She states they are hesitant with taking new medications and like taking it slower regimen.  If this is ok wants to call in 5mg  tabs [facility] [address] [place].

## 2018-09-02 ENCOUNTER — Emergency Department (HOSPITAL_COMMUNITY): Payer: PPO

## 2018-09-02 ENCOUNTER — Emergency Department (HOSPITAL_COMMUNITY)
Admission: EM | Admit: 2018-09-02 | Discharge: 2018-09-02 | Disposition: A | Discharge status: Home / Self Care | Payer: PPO | Attending: Emergency Medicine | Admitting: Emergency Medicine

## 2018-09-02 ENCOUNTER — Other Ambulatory Visit: Payer: Self-pay

## 2018-09-02 DIAGNOSIS — M549 Dorsalgia, unspecified: Secondary | ICD-10-CM

## 2018-09-02 DIAGNOSIS — F0390 Unspecified dementia without behavioral disturbance: Secondary | ICD-10-CM | POA: Insufficient documentation

## 2018-09-02 DIAGNOSIS — M4856XA Collapsed vertebra, not elsewhere classified, lumbar region, initial encounter for fracture: Secondary | ICD-10-CM | POA: Insufficient documentation

## 2018-09-02 DIAGNOSIS — K5730 Diverticulosis of large intestine without perforation or abscess without bleeding: Secondary | ICD-10-CM | POA: Diagnosis not present

## 2018-09-02 DIAGNOSIS — R109 Unspecified abdominal pain: Secondary | ICD-10-CM | POA: Diagnosis not present

## 2018-09-02 DIAGNOSIS — Y999 Unspecified external cause status: Secondary | ICD-10-CM | POA: Insufficient documentation

## 2018-09-02 DIAGNOSIS — J449 Chronic obstructive pulmonary disease, unspecified: Secondary | ICD-10-CM | POA: Diagnosis not present

## 2018-09-02 DIAGNOSIS — S32010A Wedge compression fracture of first lumbar vertebra, initial encounter for closed fracture: Secondary | ICD-10-CM | POA: Diagnosis not present

## 2018-09-02 DIAGNOSIS — Y9389 Activity, other specified: Secondary | ICD-10-CM | POA: Insufficient documentation

## 2018-09-02 DIAGNOSIS — M545 Low back pain, unspecified: Secondary | ICD-10-CM

## 2018-09-02 DIAGNOSIS — M8448XA Pathological fracture, other site, initial encounter for fracture: Secondary | ICD-10-CM | POA: Diagnosis not present

## 2018-09-02 DIAGNOSIS — Z950 Presence of cardiac pacemaker: Secondary | ICD-10-CM | POA: Insufficient documentation

## 2018-09-02 DIAGNOSIS — X500XXA Overexertion from strenuous movement or load, initial encounter: Secondary | ICD-10-CM | POA: Diagnosis not present

## 2018-09-02 DIAGNOSIS — Y929 Unspecified place or not applicable: Secondary | ICD-10-CM | POA: Diagnosis not present

## 2018-09-02 DIAGNOSIS — N183 Chronic kidney disease, stage 3 (moderate): Secondary | ICD-10-CM | POA: Diagnosis not present

## 2018-09-02 DIAGNOSIS — I129 Hypertensive chronic kidney disease with stage 1 through stage 4 chronic kidney disease, or unspecified chronic kidney disease: Secondary | ICD-10-CM | POA: Diagnosis not present

## 2018-09-02 DIAGNOSIS — E1122 Type 2 diabetes mellitus with diabetic chronic kidney disease: Secondary | ICD-10-CM | POA: Diagnosis not present

## 2018-09-02 LAB — CBC
HCT: 44.9 % (ref 39.0–52.0)
Hemoglobin: 14.4 g/dL (ref 13.0–17.0)
MCH: 26.9 pg (ref 26.0–34.0)
MCHC: 32.1 g/dL (ref 30.0–36.0)
MCV: 83.9 fL (ref 80.0–100.0)
Platelets: 271 10*3/uL (ref 150–400)
RBC: 5.35 MIL/uL (ref 4.22–5.81)
RDW: 17.6 % — ABNORMAL HIGH (ref 11.5–15.5)
WBC: 9.2 10*3/uL (ref 4.0–10.5)
nRBC: 0 % (ref 0.0–0.2)

## 2018-09-02 LAB — BASIC METABOLIC PANEL
Anion gap: 11 (ref 5–15)
BUN: 20 mg/dL (ref 8–23)
CO2: 22 mmol/L (ref 22–32)
Calcium: 9.4 mg/dL (ref 8.9–10.3)
Chloride: 108 mmol/L (ref 98–111)
Creatinine, Ser: 1.39 mg/dL — ABNORMAL HIGH (ref 0.61–1.24)
GFR calc Af Amer: 54 mL/min — ABNORMAL LOW (ref >60)
GFR calc non Af Amer: 46 mL/min — ABNORMAL LOW (ref >60)
Glucose, Bld: 106 mg/dL — ABNORMAL HIGH (ref 70–99)
Potassium: 4.2 mmol/L (ref 3.5–5.1)
Sodium: 141 mmol/L (ref 135–145)

## 2018-09-02 LAB — URINALYSIS, ROUTINE W REFLEX MICROSCOPIC
Bilirubin Urine: NEGATIVE
Glucose, UA: NEGATIVE mg/dL
Hgb urine dipstick: NEGATIVE
Ketones, ur: NEGATIVE mg/dL
Leukocytes, UA: NEGATIVE
Nitrite: NEGATIVE
Protein, ur: NEGATIVE mg/dL
Specific Gravity, Urine: 1.009 (ref 1.005–1.030)
pH: 5 (ref 5.0–8.0)

## 2018-09-02 MED ORDER — OXYCODONE-ACETAMINOPHEN 5-325 MG PO TABS: 1.0000 | ORAL_TABLET | ORAL | 0 refills | Status: DC | PRN

## 2018-09-02 MED ORDER — IOHEXOL 300 MG/ML  SOLN
100.0000 mL | Freq: Once | INTRAMUSCULAR | Status: AC | PRN
  Administered 2018-09-02: 100 mL via INTRAVENOUS

## 2018-09-02 MED ORDER — OXYCODONE-ACETAMINOPHEN 5-325 MG PO TABS
1.0000 | ORAL_TABLET | Freq: Once | ORAL | Status: AC
  Administered 2018-09-02: 1 via ORAL
  Filled 2018-09-02: qty 1

## 2018-09-02 MED ORDER — DOCUSATE SODIUM 100 MG PO CAPS: 100.0000 mg | ORAL_CAPSULE | Freq: Two times a day (BID) | ORAL | 0 refills | Status: DC

## 2018-09-02 NOTE — ED Provider Notes (Signed)
Emergency Department Provider Note   I have reviewed the triage vital signs and the nursing notes.   HISTORY  Chief Complaint Back Pain   HPI Edward Suarez is a 84 y.o. male who presents the emergency department today with left-sided back pain.  Patient states that he lifted a 10 pound bag of ice before Thanksgiving and couple days later started having left lower back pain is pretty significant causing difficulty to walk because of the pain.  Worse with movement and better with rest but progressively worsening today.  Never had any history of panic this before.  He saw his doctor he started on Tylenol which did not seem to help.  Talked his doctor again which increased his pain medication but then get a chance to get it filled prior to coming here.  After physical examination were noticed his abdomen is distended they state is not normal for him.  He has not had any trouble with urination or defecation recently.  No rashes. No other associated or modifying symptoms.    Past Medical History:  Diagnosis Date  . Allergic rhinitis, cause unspecified   . BPH (benign prostatic hyperplasia)   . Carotid artery stenosis, asymptomatic    1-39% by dopplers 09/2016 followed by Dr. Darrick Penna  . Cataracts, bilateral   . Chronic kidney disease (CKD), stage III (moderate) (HCC)   . Complete heart block (HCC) 01/23/04   s/p Medtronic PPM implanted by Dr Amil Amen  . COPD (chronic obstructive pulmonary disease) (HCC)   . Diabetes mellitus   . Glaucoma   . Hyperlipidemia   . Hypertension   . 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 Kidney stones   . PAF (paroxysmal atrial fibrillation) (HCC) 10/09/2014   Noted on pacer check with 88 mode switches and longest episode >1 hour.  Now on Apixiban for CHADS2VASC score of 5    Patient Active Problem List   Diagnosis Date Noted  . Dementia arising in the senium and presenium (HCC) 11/19/2017  . PAF (paroxysmal atrial fibrillation) (HCC) 11/06/2016    . 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 Cervicalgia 10/19/2015  . Cardiac pacemaker in situ 10/19/2015  . Chronic renal insufficiency, stage III (moderate) (HCC) 11/21/2014  . 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 Allergic rhinitis 08/10/2014  . Hypercholesterolemia without hypertriglyceridemia 08/10/2014  . Snoring 06/15/2014  . Memory loss, short term 06/15/2014  . MCI (mild cognitive impairment) with memory loss 06/15/2014  . 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 PVC (premature ventricular contraction) 05/05/2014  . 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 Amnesia 05/04/2014  . Pacemaker-Medtronic 04/17/2012  . Hypertension 04/15/2012  . Atrioventricular block, complete (HCC) 04/13/2012  . Carotid stenosis, bilateral 02/07/2012    Past Surgical History:  Procedure Laterality Date  . PACEMAKER INSERTION  2005, 04/16/12   MDT implanted by Dr Amil Amen with generator chnage (MDT Adapta L) by Dr Johney Frame 04/16/12  . PERMANENT PACEMAKER GENERATOR CHANGE N/A 04/16/2012   Procedure: PERMANENT PACEMAKER GENERATOR CHANGE;  Surgeon: Hillis Range, MD;  Location: [facility] CATH LAB;  Service: Cardiovascular;  Laterality: N/A;    Current Outpatient Rx  . Order #: [id number] Class: Normal  . Order #: [id number] Class: Normal  . Order #: [id number] Class: Normal  . Order #: [id number] Class: Historical Med  . Order #: [id number] Class: Normal  . Order #: [id number] Class: Historical Med  . Order #: [id number] Class: Historical Med  . Order #:  [id number] Class: Historical Med  . Order #: [id number] Class: Normal  . Order #: [id number] Class: Historical Med  . Order #: [id number] Class: Print  . Order #: [id number] Class: Print    Allergies Penicillins  Family History  Problem Relation Age of Onset  . Heart disease Father   . Heart attack Father     Social History Social History   Tobacco Use  . Smoking  status: Former Smoker    Types: Cigarettes    Last attempt to quit: 09/16/1998    Years since quitting: 19.9  . Smokeless tobacco: Never Used  Substance Use Topics  . Alcohol use: Yes    Alcohol/week: 5.0 standard drinks    Types: 2 Glasses of wine, 3 Shots of liquor per week    Comment: drinks a tequila shot daily  . Drug use: No    Review of Systems  All other systems negative except as documented in the HPI. All pertinent positives and negatives as reviewed in the HPI. ____________________________________________   PHYSICAL EXAM:  VITAL SIGNS: ED Triage Vitals  Enc Vitals Group     BP 09/02/18 0119 (!) 186/108     Pulse Rate 09/02/18 0119 85     Resp 09/02/18 0119 16     Temp 09/02/18 0119 (!) 97.5 F (36.4 C)     Temp Source 09/02/18 0119 Oral     SpO2 09/02/18 0119 100 %     Weight 09/02/18 0146 155 lb (70.3 kg)     Height 09/02/18 0146 5\' 9"  (1.753 m)    Constitutional: Alert and oriented. Well appearing and in no acute distress. Eyes: Conjunctivae are normal. PERRL. EOMI. Head: Atraumatic. Nose: No congestion/rhinnorhea. Mouth/Throat: Mucous membranes are moist.  Oropharynx non-erythematous. Neck: No stridor.  No meningeal signs.   Cardiovascular: Normal rate, regular rhythm. Good peripheral circulation. Grossly normal heart sounds.   Respiratory: Normal respiratory effort.  No retractions. Lungs CTAB. Gastrointestinal: Soft and nontender. No distention.  Musculoskeletal: left lower and midline back pain in lumbar area.  Neurologic:  Normal speech and language. No gross focal neurologic deficits are appreciated.  Skin:  Skin is warm, dry and intact. No rash noted.  ____________________________________________   LABS (all labs ordered are listed, but only abnormal results are displayed)  Labs Reviewed  URINALYSIS, ROUTINE W REFLEX MICROSCOPIC - Abnormal; Notable for the following components:      Result Value   Color, Urine STRAW (*)    All other  components within normal limits  BASIC METABOLIC PANEL - Abnormal; Notable for the following components:   Glucose, Bld 106 (*)    Creatinine, Ser 1.39 (*)    GFR calc non Af Amer 46 (*)    GFR calc Af Amer 54 (*)    All other components within normal limits  CBC - Abnormal; Notable for the following components:   RDW 17.6 (*)    All other components within normal limits   ____________________________________________  RADIOLOGY  Ct Abdomen Pelvis W Contrast  Result Date: 09/02/2018 CLINICAL DATA:  Unintended weight loss. Nonlocalized abdominal pain. Patient reports back pain after picking up heavy object last month. EXAM: CT ABDOMEN AND PELVIS WITH CONTRAST TECHNIQUE: Multidetector CT imaging of the abdomen and pelvis was performed using the standard protocol following bolus administration of intravenous contrast. CONTRAST:  OMNIPAQUE IOHEXOL 300 MG/ML  SOLN COMPARISON:  None. FINDINGS: Lower chest: Pacemaker wires are partially included. Mild emphysema at the lung bases with a few scattered no consolidation, pleural fluid or pulmonary nodule.  Pulmonary cysts. Hepatobiliary: No focal liver abnormality is seen. No gallstones, gallbladder wall thickening, or biliary dilatation. Pancreas: Unremarkable. No pancreatic ductal dilatation or surrounding inflammatory changes. Spleen: Normal in size without focal abnormality. Adrenals/Urinary Tract: No adrenal nodule. No hydronephrosis or perinephric edema. Homogeneous renal enhancement with symmetric excretion on delayed phase imaging. No renal mass. Urinary bladder is physiologically distended without wall thickening. Stomach/Bowel: Bowel evaluation is limited in the absence of enteric contrast. The stomach is decompressed. No bowel obstruction, inflammatory change, or evident wall thickening. Normal appendix. Moderate stool burden throughout the colon. Diverticulosis of the sigmoid colon without diverticulitis. Areas of sigmoid colonic  nondistention. No obvious bowel mass. Vascular/Lymphatic: Aorta bi-iliac atherosclerosis without aneurysm. Mesenteric vessels and portal vein are patent. No abdominopelvic adenopathy. Reproductive: Prominent sized prostate gland with mild mass effect on the bladder base. Other: No free air, free fluid, or intra-abdominal fluid collection. Fat in both inguinal canals. Musculoskeletal: Mild loss of height at L1 with inferior endplate concavity suspicious for mild compression fracture. No posterior cortex involvement. Degenerative disc disease at L5-S1 with Modic endplate changes. Multilevel facet arthropathy. IMPRESSION: 1. Mild inferior endplate L1 compression fracture, may be subacute. 2. Colonic diverticulosis without diverticulitis. Areas of the sigmoid colon are nondistended limiting assessment. Given provided history of unintended weight loss, consider colonoscopy for further evaluation. 3. Prominent prostate gland. 4.  Aortic Atherosclerosis (ICD10-I70.0). Electronically Signed   By: Narda RutherfordMelanie  Sanford M.D.   On: 09/02/2018 03:50   Ct L-spine No Charge  Result Date: 09/02/2018 CLINICAL DATA:  Back pain for 1 month EXAM: CT LUMBAR SPINE WITHOUT CONTRAST TECHNIQUE: Multidetector CT imaging of the lumbar spine was performed without intravenous contrast administration. Multiplanar CT image reconstructions were also generated. COMPARISON:  CT thoracic spine 04/06/2016 FINDINGS: Segmentation: Normal Alignment: Normal Vertebrae: There is mild compression deformity of L1 with approximately 25% height loss anteriorly. No retropulsion. The other vertebral body heights are maintained. There is endplate sclerosis at L5-S1. Paraspinal and other soft tissues: There is calcific aortic atherosclerosis. Disc levels: L4-5: Disc space narrowing with mild disc bulge. No spinal canal stenosis. Mild bilateral foraminal stenosis. L5-S1: Severe disc space narrowing with endplate spurring. No central spinal canal stenosis. Moderate  bilateral neural foraminal stenosis. IMPRESSION: 1. Mild L1 compression fracture with approximately 25% height loss anteriorly, likely subacute or chronic, but new compared to 04/06/2016. 2. Moderate bilateral L5-S1 neural foraminal stenosis. Aortic Atherosclerosis (ICD10-I70.0). Electronically Signed   By: Deatra RobinsonKevin  Herman M.D.   On: 09/02/2018 05:01    ____________________________________________   PROCEDURES  Procedure(s) performed:   Procedures   ____________________________________________   INITIAL IMPRESSION / ASSESSMENT AND PLAN / ED COURSE  Attempted bedside US to evaluate aorta but not able to get a good view. No obvious large aneurysm but abdomen was slightly distended and seemed painful to deep palpation. Will CT to eval aorta and causes of distension.  Compression fracture. Will treat for same. PCP/NSG follow up. Colace while on pain meds.      Pertinent labs & imaging results that were available during my care of the patient were reviewed by me and considered in my medical decision making (see chart for details).  ____________________________________________  FINAL CLINICAL IMPRESSION(S) / ED DIAGNOSES  Final diagnoses:  Back pain  Acute left-sided low back pain without sciatica  Non-traumatic compression fracture of first lumbar vertebra, initial encounter (HCC)     MEDICATIONS GIVEN DURING THIS VISIT:  Medications  oxyCODONE-acetaminophen (PERCOCET/ROXICET) 5-325 MG per tablet 1 tablet (1 tablet Oral Given  09/02/18 0242)  iohexol (OMNIPAQUE) 300 MG/ML solution 100 mL (100 mLs Intravenous Contrast Given 09/02/18 0309)     NEW OUTPATIENT MEDICATIONS STARTED DURING THIS VISIT:  Discharge Medication List as of 09/02/2018  4:37 AM    START taking these medications   Details  docusate sodium (COLACE) 100 MG capsule Take 1 capsule (100 mg total) by mouth every 12 (twelve) hours., Starting Wed 09/02/2018, Print    oxyCODONE-acetaminophen (PERCOCET) 5-325 MG  tablet Take 1 tablet by mouth every 4 (four) hours as needed for severe pain., Starting Wed 09/02/2018, Print        Note:  This note was prepared with assistance of Dragon voice recognition software. Occasional wrong-word or sound-a-like substitutions may have occurred due to the inherent limitations of voice recognition software.   Acel Natzke, Barbara Cower, MD 09/02/18 [phone]

## 2018-09-02 NOTE — ED Triage Notes (Signed)
Pt reports picking up something heavy around Thanksgiving resulting in back pain. Has seen PCP who prescribed Tylenol, pt has seen no relief.

## 2018-09-03 ENCOUNTER — Ambulatory Visit (INDEPENDENT_AMBULATORY_CARE_PROVIDER_SITE_OTHER): Payer: PPO

## 2018-09-03 ENCOUNTER — Telehealth: Payer: Self-pay

## 2018-09-03 DIAGNOSIS — I442 Atrioventricular block, complete: Secondary | ICD-10-CM | POA: Diagnosis not present

## 2018-09-03 NOTE — Telephone Encounter (Signed)
Spoke with pt and reminded pt of remote transmission that is due today. Pt verbalized understanding.   

## 2018-09-03 NOTE — Progress Notes (Signed)
Remote pacemaker transmission.   

## 2018-09-04 ENCOUNTER — Other Ambulatory Visit: Payer: Self-pay | Admitting: Internal Medicine

## 2018-09-04 DIAGNOSIS — M8588 Other specified disorders of bone density and structure, other site: Secondary | ICD-10-CM

## 2018-09-04 DIAGNOSIS — S32010G Wedge compression fracture of first lumbar vertebra, subsequent encounter for fracture with delayed healing: Secondary | ICD-10-CM | POA: Diagnosis not present

## 2018-09-07 DIAGNOSIS — I10 Essential (primary) hypertension: Secondary | ICD-10-CM | POA: Diagnosis not present

## 2018-09-07 DIAGNOSIS — I480 Paroxysmal atrial fibrillation: Secondary | ICD-10-CM | POA: Diagnosis not present

## 2018-09-07 DIAGNOSIS — Z7984 Long term (current) use of oral hypoglycemic drugs: Secondary | ICD-10-CM | POA: Diagnosis not present

## 2018-09-07 DIAGNOSIS — E039 Hypothyroidism, unspecified: Secondary | ICD-10-CM | POA: Diagnosis not present

## 2018-09-07 DIAGNOSIS — J439 Emphysema, unspecified: Secondary | ICD-10-CM | POA: Diagnosis not present

## 2018-09-07 DIAGNOSIS — N183 Chronic kidney disease, stage 3 (moderate): Secondary | ICD-10-CM | POA: Diagnosis not present

## 2018-09-07 DIAGNOSIS — F0390 Unspecified dementia without behavioral disturbance: Secondary | ICD-10-CM | POA: Diagnosis not present

## 2018-09-07 DIAGNOSIS — E1122 Type 2 diabetes mellitus with diabetic chronic kidney disease: Secondary | ICD-10-CM | POA: Diagnosis not present

## 2018-09-07 DIAGNOSIS — E782 Mixed hyperlipidemia: Secondary | ICD-10-CM | POA: Diagnosis not present

## 2018-09-10 ENCOUNTER — Other Ambulatory Visit: Payer: Self-pay | Admitting: Internal Medicine

## 2018-09-10 DIAGNOSIS — S32010G Wedge compression fracture of first lumbar vertebra, subsequent encounter for fracture with delayed healing: Secondary | ICD-10-CM

## 2018-09-17 DIAGNOSIS — S32000A Wedge compression fracture of unspecified lumbar vertebra, initial encounter for closed fracture: Secondary | ICD-10-CM | POA: Diagnosis not present

## 2018-09-17 DIAGNOSIS — I10 Essential (primary) hypertension: Secondary | ICD-10-CM | POA: Diagnosis not present

## 2018-09-24 ENCOUNTER — Ambulatory Visit
Admission: RE | Admit: 2018-09-24 | Discharge: 2018-09-24 | Disposition: A | Discharge status: Home / Self Care | Payer: PPO | Source: Ambulatory Visit | Attending: Internal Medicine | Admitting: Internal Medicine

## 2018-09-24 DIAGNOSIS — S32010G Wedge compression fracture of first lumbar vertebra, subsequent encounter for fracture with delayed healing: Secondary | ICD-10-CM

## 2018-09-24 DIAGNOSIS — M8589 Other specified disorders of bone density and structure, multiple sites: Secondary | ICD-10-CM | POA: Diagnosis not present

## 2018-09-29 ENCOUNTER — Other Ambulatory Visit: Payer: PPO

## 2018-10-04 LAB — CUP PACEART REMOTE DEVICE CHECK
Battery Impedance: 1045 Ohm
Battery Remaining Longevity: 44 mo
Battery Voltage: 2.77 V
Brady Statistic AP VP Percent: 95 %
Brady Statistic AP VS Percent: 0 %
Brady Statistic AS VP Percent: 5 %
Brady Statistic AS VS Percent: 0 %
Date Time Interrogation Session: 20191219172422
Implantable Lead Implant Date: 20050509
Implantable Lead Implant Date: 20050509
Implantable Lead Location: 753859
Implantable Lead Location: 753860
Implantable Lead Model: 5076
Implantable Lead Model: 5092
Implantable Pulse Generator Implant Date: 20130801
Lead Channel Impedance Value: 393 Ohm
Lead Channel Impedance Value: 597 Ohm
Lead Channel Pacing Threshold Amplitude: 0.625 V
Lead Channel Pacing Threshold Amplitude: 1.125 V
Lead Channel Pacing Threshold Pulse Width: 0.4 ms
Lead Channel Pacing Threshold Pulse Width: 0.4 ms
Lead Channel Setting Pacing Amplitude: 2 V
Lead Channel Setting Pacing Amplitude: 2.75 V
Lead Channel Setting Pacing Pulse Width: 0.46 ms
Lead Channel Setting Sensing Sensitivity: 4 mV

## 2018-10-14 DIAGNOSIS — E1122 Type 2 diabetes mellitus with diabetic chronic kidney disease: Secondary | ICD-10-CM | POA: Diagnosis not present

## 2018-10-14 DIAGNOSIS — I10 Essential (primary) hypertension: Secondary | ICD-10-CM | POA: Diagnosis not present

## 2018-10-14 DIAGNOSIS — E039 Hypothyroidism, unspecified: Secondary | ICD-10-CM | POA: Diagnosis not present

## 2018-10-14 DIAGNOSIS — I480 Paroxysmal atrial fibrillation: Secondary | ICD-10-CM | POA: Diagnosis not present

## 2018-10-14 DIAGNOSIS — E782 Mixed hyperlipidemia: Secondary | ICD-10-CM | POA: Diagnosis not present

## 2018-10-14 DIAGNOSIS — F0390 Unspecified dementia without behavioral disturbance: Secondary | ICD-10-CM | POA: Diagnosis not present

## 2018-10-14 DIAGNOSIS — N183 Chronic kidney disease, stage 3 (moderate): Secondary | ICD-10-CM | POA: Diagnosis not present

## 2018-10-14 DIAGNOSIS — J439 Emphysema, unspecified: Secondary | ICD-10-CM | POA: Diagnosis not present

## 2018-10-29 ENCOUNTER — Other Ambulatory Visit: Payer: Self-pay

## 2018-10-29 DIAGNOSIS — I6523 Occlusion and stenosis of bilateral carotid arteries: Secondary | ICD-10-CM

## 2018-11-02 ENCOUNTER — Ambulatory Visit: Payer: PPO | Admitting: Family

## 2018-11-02 ENCOUNTER — Ambulatory Visit (HOSPITAL_COMMUNITY): Payer: PPO

## 2018-11-11 DIAGNOSIS — I10 Essential (primary) hypertension: Secondary | ICD-10-CM | POA: Diagnosis not present

## 2018-11-11 DIAGNOSIS — E039 Hypothyroidism, unspecified: Secondary | ICD-10-CM | POA: Diagnosis not present

## 2018-11-11 DIAGNOSIS — E1122 Type 2 diabetes mellitus with diabetic chronic kidney disease: Secondary | ICD-10-CM | POA: Diagnosis not present

## 2018-11-11 DIAGNOSIS — I480 Paroxysmal atrial fibrillation: Secondary | ICD-10-CM | POA: Diagnosis not present

## 2018-11-11 DIAGNOSIS — J439 Emphysema, unspecified: Secondary | ICD-10-CM | POA: Diagnosis not present

## 2018-11-11 DIAGNOSIS — E782 Mixed hyperlipidemia: Secondary | ICD-10-CM | POA: Diagnosis not present

## 2018-11-11 DIAGNOSIS — F0390 Unspecified dementia without behavioral disturbance: Secondary | ICD-10-CM | POA: Diagnosis not present

## 2018-11-11 DIAGNOSIS — N183 Chronic kidney disease, stage 3 (moderate): Secondary | ICD-10-CM | POA: Diagnosis not present

## 2018-11-12 ENCOUNTER — Encounter: Payer: Self-pay | Admitting: Internal Medicine

## 2018-11-19 ENCOUNTER — Other Ambulatory Visit: Payer: Self-pay

## 2018-11-19 ENCOUNTER — Ambulatory Visit (HOSPITAL_COMMUNITY)
Admission: RE | Admit: 2018-11-19 | Discharge: 2018-11-19 | Disposition: A | Discharge status: Home / Self Care | Payer: PPO | Source: Ambulatory Visit | Attending: Vascular Surgery | Admitting: Vascular Surgery

## 2018-11-19 ENCOUNTER — Encounter: Payer: Self-pay | Admitting: Vascular Surgery

## 2018-11-19 ENCOUNTER — Ambulatory Visit (INDEPENDENT_AMBULATORY_CARE_PROVIDER_SITE_OTHER): Payer: PPO | Admitting: Vascular Surgery

## 2018-11-19 VITALS — BP 149/84 | HR 79 | Temp 97.7°F | Resp 18 | Ht 69.0 in | Wt 154.1 lb

## 2018-11-19 DIAGNOSIS — I6523 Occlusion and stenosis of bilateral carotid arteries: Secondary | ICD-10-CM

## 2018-11-19 NOTE — Progress Notes (Signed)
Patient is an 85 year old male who returns for follow-up today.  He was initially seen in 2017 for a 50% stenosis by CT scan of the neck that was done for neck pain.  He continues to deny any symptoms of TIA amaurosis or stroke.  He is on Eliquis and a statin.  Chronic medical problems include CKD3, COPD, diabetes, hyperlipidemia, hypertension, paroxysmal atrial fibrillation.  These are all currently stable.  He was last seen in our office in anywhere of 2018.  He had minimal carotid stenosis on that duplex scan.  Past Medical History:  Diagnosis Date  . Allergic rhinitis, cause unspecified   . BPH (benign prostatic hyperplasia)   . Carotid artery stenosis, asymptomatic    1-39% by dopplers 09/2016 followed by Dr. Darrick Penna  . Cataracts, bilateral   . Chronic kidney disease (CKD), stage III (moderate) (HCC)   . Complete heart block (HCC) 01/23/04   s/p Medtronic PPM implanted by Dr Amil Amen  . COPD (chronic obstructive pulmonary disease) (HCC)   . Diabetes mellitus   . Glaucoma   . Hyperlipidemia   . Hypertension   . 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 Kidney stones   . PAF (paroxysmal atrial fibrillation) (HCC) 10/09/2014   Noted on pacer check with 88 mode switches and longest episode >1 hour.  Now on Apixiban for CHADS2VASC score of 5    Past Surgical History:  Procedure Laterality Date  . PACEMAKER INSERTION  2005, 04/16/12   MDT implanted by Dr Amil Amen with generator chnage (MDT Adapta L) by Dr Johney Frame 04/16/12  . PERMANENT PACEMAKER GENERATOR CHANGE N/A 04/16/2012   Procedure: PERMANENT PACEMAKER GENERATOR CHANGE;  Surgeon: Hillis Range, MD;  Location: [facility] CATH LAB;  Service: Cardiovascular;  Laterality: N/A;    View of systems: He denies chest pain.  He denies shortness of breath.  Current Outpatient Medications on File Prior to Visit  Medication Sig Dispense Refill  . amLODipine (NORVASC) 5 MG tablet TAKE 1 TABLET BY MOUTH EVERY DAY (Patient taking differently: Take 5 mg by  mouth daily. ) 90 tablet 2  . apixaban (ELIQUIS) 2.5 MG TABS tablet Take 1 tablet (2.5 mg total) by mouth 2 (two) times daily. 180 tablet 3  . atorvastatin (LIPITOR) 80 MG tablet TAKE 1 TABLET (80 MG TOTAL) BY MOUTH EVERY EVENING. 90 tablet 2  . cetirizine (ZYRTEC) 10 MG tablet Take 10 mg by mouth daily. For allergies    . docusate sodium (COLACE) 100 MG capsule Take 1 capsule (100 mg total) by mouth every 12 (twelve) hours. 60 capsule 0  . donepezil (ARICEPT) 10 MG tablet TAKE 1 TABLET BY MOUTH AT BEDTIME (Patient taking differently: Take 10 mg by mouth at bedtime. ) 90 tablet 2  . fluticasone (FLONASE) 50 MCG/ACT nasal spray Place 2 sprays into both nostrils daily as needed for allergies or rhinitis.    [name] Kitchen glipiZIDE (GLUCOTROL) 5 MG tablet Take 5 mg by mouth daily before breakfast.      . levothyroxine (SYNTHROID, LEVOTHROID) 50 MCG tablet Take 50 mcg by mouth daily before breakfast.   3  . memantine (NAMENDA) 5 MG tablet Take 10 mg by mouth in AM and 5 mg by mouth in PM (Patient taking differently: Take 5-10 mg by mouth See admin instructions. Take 2 tablets every morning then take 1 tablet every evening) 270 tablet 1  . oxyCODONE-acetaminophen (PERCOCET) 5-325 MG tablet Take 1 tablet by mouth every 4 (four) hours as needed for severe pain. 20 tablet 0  .  pioglitazone (ACTOS) 45 MG tablet Take 45 mg by mouth daily.      No current facility-administered medications on file prior to visit.     Physical exam:  Vitals:   11/19/18 1539 11/19/18 1542  BP: (!) 166/89 (!) 149/84  Pulse: 79   Resp: 18   Temp: 97.7 F (36.5 C)   SpO2: 96%   Weight: 154 lb 1.6 oz (69.9 kg)   Height: 5\' 9"  (1.753 m)     Neck: No carotid bruits  Chest: Clear to auscultation bilaterally  Cardiac: Regular rate and rhythm  Neuro: Symmetric upper extremity lower extremity motor strength 5/5 no facial asymmetry  Data: Patient had a carotid duplex exam today which again shows minimal carotid stenosis  bilaterally.  Assessment: Patient has minimal bilateral carotid stenosis.  He is asymptomatic.  This is unchanged over the last 3 years.  Plan: Patient will follow-up with [place] on an as-needed basis if he develops new carotid symptoms or progression of disease by bruit on physical exam or other symptoms.  Fabienne Bruns, MD [facility] Office: [phone] Pager: [phone]

## 2018-11-23 ENCOUNTER — Ambulatory Visit: Payer: PPO | Admitting: Family

## 2018-11-23 ENCOUNTER — Encounter (HOSPITAL_COMMUNITY): Payer: PPO

## 2018-11-23 DIAGNOSIS — I10 Essential (primary) hypertension: Secondary | ICD-10-CM | POA: Diagnosis not present

## 2018-11-23 DIAGNOSIS — E7800 Pure hypercholesterolemia, unspecified: Secondary | ICD-10-CM | POA: Diagnosis not present

## 2018-11-23 DIAGNOSIS — I480 Paroxysmal atrial fibrillation: Secondary | ICD-10-CM | POA: Diagnosis not present

## 2018-11-23 DIAGNOSIS — I6521 Occlusion and stenosis of right carotid artery: Secondary | ICD-10-CM | POA: Diagnosis not present

## 2018-11-23 DIAGNOSIS — E039 Hypothyroidism, unspecified: Secondary | ICD-10-CM | POA: Diagnosis not present

## 2018-11-23 DIAGNOSIS — F0390 Unspecified dementia without behavioral disturbance: Secondary | ICD-10-CM | POA: Diagnosis not present

## 2018-11-23 DIAGNOSIS — Z0000 Encounter for general adult medical examination without abnormal findings: Secondary | ICD-10-CM | POA: Diagnosis not present

## 2018-11-23 DIAGNOSIS — E1122 Type 2 diabetes mellitus with diabetic chronic kidney disease: Secondary | ICD-10-CM | POA: Diagnosis not present

## 2018-11-23 DIAGNOSIS — N183 Chronic kidney disease, stage 3 (moderate): Secondary | ICD-10-CM | POA: Diagnosis not present

## 2018-11-23 DIAGNOSIS — Z1389 Encounter for screening for other disorder: Secondary | ICD-10-CM | POA: Diagnosis not present

## 2018-11-23 DIAGNOSIS — J439 Emphysema, unspecified: Secondary | ICD-10-CM | POA: Diagnosis not present

## 2018-11-25 ENCOUNTER — Encounter: Payer: PPO | Admitting: Nurse Practitioner

## 2018-12-03 ENCOUNTER — Telehealth: Payer: Self-pay

## 2018-12-03 ENCOUNTER — Encounter: Payer: PPO | Admitting: *Deleted

## 2018-12-03 ENCOUNTER — Other Ambulatory Visit: Payer: Self-pay

## 2018-12-03 NOTE — Telephone Encounter (Signed)
Call placed to Pt.  Advised Pt routine follow ups being cancelled at this time.  Per Pt-he is doing well.  Would like a phone call to reschedule when appointments become available again.  Will continue to follow remotely.

## 2018-12-09 ENCOUNTER — Encounter: Payer: PPO | Admitting: Internal Medicine

## 2018-12-10 NOTE — Progress Notes (Signed)
No show letter

## 2018-12-14 ENCOUNTER — Telehealth: Payer: Self-pay | Admitting: Cardiology

## 2018-12-14 DIAGNOSIS — N183 Chronic kidney disease, stage 3 (moderate): Secondary | ICD-10-CM | POA: Diagnosis not present

## 2018-12-14 DIAGNOSIS — E1122 Type 2 diabetes mellitus with diabetic chronic kidney disease: Secondary | ICD-10-CM | POA: Diagnosis not present

## 2018-12-14 DIAGNOSIS — F0390 Unspecified dementia without behavioral disturbance: Secondary | ICD-10-CM | POA: Diagnosis not present

## 2018-12-14 DIAGNOSIS — I480 Paroxysmal atrial fibrillation: Secondary | ICD-10-CM | POA: Diagnosis not present

## 2018-12-14 DIAGNOSIS — J439 Emphysema, unspecified: Secondary | ICD-10-CM | POA: Diagnosis not present

## 2018-12-14 DIAGNOSIS — E782 Mixed hyperlipidemia: Secondary | ICD-10-CM | POA: Diagnosis not present

## 2018-12-14 DIAGNOSIS — E039 Hypothyroidism, unspecified: Secondary | ICD-10-CM | POA: Diagnosis not present

## 2018-12-14 DIAGNOSIS — I10 Essential (primary) hypertension: Secondary | ICD-10-CM | POA: Diagnosis not present

## 2018-12-14 NOTE — Telephone Encounter (Signed)
Informed pt that we did not receive his remote transmission. He said he would send tomorrow.

## 2018-12-14 NOTE — Telephone Encounter (Signed)
Returning patient call. LMOVM for pt to return call. Remote transmission has not been received.

## 2018-12-15 ENCOUNTER — Ambulatory Visit (INDEPENDENT_AMBULATORY_CARE_PROVIDER_SITE_OTHER): Payer: PPO | Admitting: *Deleted

## 2018-12-15 ENCOUNTER — Other Ambulatory Visit: Payer: Self-pay

## 2018-12-15 DIAGNOSIS — I442 Atrioventricular block, complete: Secondary | ICD-10-CM | POA: Diagnosis not present

## 2018-12-15 NOTE — Telephone Encounter (Signed)
New Message:    Wife wants to know if you received pt's transmission?

## 2018-12-15 NOTE — Telephone Encounter (Signed)
Spoke w/ pt and his wife and instructed them how to send manual transmission.

## 2018-12-16 LAB — CUP PACEART REMOTE DEVICE CHECK
Battery Impedance: 1204 Ohm
Battery Remaining Longevity: 38 mo
Battery Voltage: 2.76 V
Brady Statistic AP VP Percent: 94 %
Brady Statistic AP VS Percent: 0 %
Brady Statistic AS VP Percent: 5 %
Brady Statistic AS VS Percent: 0 %
Date Time Interrogation Session: 20200331163509
Implantable Lead Implant Date: 20050509
Implantable Lead Implant Date: 20050509
Implantable Lead Location: 753859
Implantable Lead Location: 753860
Implantable Lead Model: 5076
Implantable Lead Model: 5092
Implantable Pulse Generator Implant Date: 20130801
Lead Channel Impedance Value: 426 Ohm
Lead Channel Impedance Value: 517 Ohm
Lead Channel Pacing Threshold Amplitude: 0.625 V
Lead Channel Pacing Threshold Amplitude: 1.25 V
Lead Channel Pacing Threshold Pulse Width: 0.4 ms
Lead Channel Pacing Threshold Pulse Width: 0.4 ms
Lead Channel Setting Pacing Amplitude: 2 V
Lead Channel Setting Pacing Amplitude: 3.25 V
Lead Channel Setting Pacing Pulse Width: 0.4 ms
Lead Channel Setting Sensing Sensitivity: 4 mV

## 2018-12-22 ENCOUNTER — Telehealth: Payer: Self-pay

## 2018-12-22 NOTE — Progress Notes (Signed)
Virtual Visit was attempted but due to technical difficulties was changed to a  Telephone Note   This visit type was conducted due to national recommendations for restrictions regarding the COVID-19 Pandemic (e.g. social distancing) in an effort to limit this patient's exposure and mitigate transmission in our community.  Due to his co-morbid illnesses, this patient is at least at moderate risk for complications without adequate follow up.  This format is felt to be most appropriate for this patient at this time.  The patient did not have access to video technology/had technical difficulties with video requiring transitioning to audio format only (telephone).  All issues noted in this document were discussed and addressed.  No physical exam could be performed with this format.  Please refer to the patient's chart for his  consent to telehealth for [facility].  Evaluation Performed:  Follow-up visit  This visit type was conducted due to national recommendations for restrictions regarding the COVID-19 Pandemic (e.g. social distancing).  This format is felt to be most appropriate for this patient at this time.  All issues noted in this document were discussed and addressed.  No physical exam was performed (except for noted visual exam findings with Video Visits).  Please refer to the patient's chart (MyChart message for video visits and phone note for telephone visits) for the patient's consent to telehealth for [facility].  Date:  12/23/2018   ID:  Edward Suarez, DOB 31-May-1932, MRN [id number]  Patient Location:  Home  Provider location:   [place]  PCP:  Renford Dills, MD  Cardiologist:  Armanda Magic, MD  Electrophysiologist:  None   Chief Complaint:  HTN, PAF, hyperlipidemia and carotid stenosis  History of Present Illness:    Edward Suarez is a 85 y.o. male who presents via audio/video conferencing for a telehealth visit today.    Edward Suarez is a 85 y.o. male with a  hx of HTN, DM, dyslipidemia,complete HB s/p PPM and PVC's,mild (1-39% bilateral)carotid artery stenosis and is followed by Dr. Darrick Penna.He also has PAF noted on pacer check in 2016 and due to [facility] score of 5, was started on Eliquis.  He is here today for followup and is doing well.  He denies any chest pain or pressure, SOB, DOE, PND, orthopnea, LE edema, dizziness, palpitations or syncope. He is compliant with his meds and is tolerating meds with no SE.    The patient does not have symptoms concerning for COVID-19 infection (fever, chills, cough, or new shortness of breath).    Prior CV studies:   The following studies were reviewed today:  none  Past Medical History:  Diagnosis Date  . Allergic rhinitis, cause unspecified   . BPH (benign prostatic hyperplasia)   . Carotid artery stenosis, asymptomatic    1-39% by dopplers 09/2016 followed by Dr. Darrick Penna  . Cataracts, bilateral   . Chronic kidney disease (CKD), stage III (moderate) (HCC)   . Complete heart block (HCC) 01/23/04   s/p Medtronic PPM implanted by Dr Amil Amen  . COPD (chronic obstructive pulmonary disease) (HCC)   . Diabetes mellitus   . Glaucoma   . Hyperlipidemia   . Hypertension   . 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 Kidney stones   . PAF (paroxysmal atrial fibrillation) (HCC) 10/09/2014   Noted on pacer check with 88 mode switches and longest episode >1 hour.  Now on Apixiban for CHADS2VASC score of 5   Past Surgical History:  Procedure Laterality Date  .  PACEMAKER INSERTION  2005, 04/16/12   MDT implanted by Dr Amil Amen with generator chnage (MDT Adapta L) by Dr Johney Frame 04/16/12  . PERMANENT PACEMAKER GENERATOR CHANGE N/A 04/16/2012   Procedure: PERMANENT PACEMAKER GENERATOR CHANGE;  Surgeon: Hillis Range, MD;  Location: [facility] CATH LAB;  Service: Cardiovascular;  Laterality: N/A;     No outpatient medications have been marked as taking for the 12/23/18 encounter (Appointment) with Quintella Reichert, MD.      Allergies:   Penicillins   Social History   Tobacco Use  . Smoking status: Former Smoker    Types: Cigarettes    Last attempt to quit: 09/16/1998    Years since quitting: 20.2  . Smokeless tobacco: Never Used  Substance Use Topics  . Alcohol use: Yes    Alcohol/week: 5.0 standard drinks    Types: 2 Glasses of wine, 3 Shots of liquor per week    Comment: drinks a tequila shot daily  . Drug use: No     Family Hx: The patient's family history includes Heart attack in his father; Heart disease in his father.  ROS:   Please see the history of present illness.     All other systems reviewed and are negative.   Labs/Other Tests and Data Reviewed:    Recent Labs: 09/02/2018: BUN 20; Creatinine, Ser 1.39; Hemoglobin 14.4; Platelets 271; Potassium 4.2; Sodium 141   Recent Lipid Panel Lab Results  Component Value Date/Time   CHOL 128 12/16/2016 08:22 AM   TRIG 74 12/16/2016 08:22 AM   HDL 55 12/16/2016 08:22 AM   CHOLHDL 2.3 12/16/2016 08:22 AM   CHOLHDL 2.7 07/08/2016 08:02 AM   LDLCALC 58 12/16/2016 08:22 AM    Wt Readings from Last 3 Encounters:  11/19/18 154 lb 1.6 oz (69.9 kg)  09/02/18 155 lb (70.3 kg)  06/15/18 159 lb 9.6 oz (72.4 kg)     Objective:    Vital Signs:  There were no vitals taken for this visit.   Virtual visit was attempted but due to technical difficulties was changed to a telephone visit so no PE was done   ASSESSMENT & PLAN:    1.  Bilateral carotid artery stenosis - dopplers done 11/2018 showed 1-39% bilateral stenosis.  He will continue on statin.  He is not on ASA due to DOAC.  2.  Complete HB - he is s/p PPM and is followed in device clinic.  His last remote revealed normal device function with 1 VHR for 10 beats.  3.  HTN - he checks his BP at home and is controlled.  He will continue on amlodipine 5mg  daily.   4.  PAF - He thinks he is in NSR and has not had any palpitations.  He will continue on Apixaban 2.5mg  BID (reduced dose due  to age>80 and creatinine >1.5) but last creatinine was 1.39 on 08/2018 which would place him in the 5mg  BID range.  I will repeat a BMET to determine appropriate dose. Hbg was stable at 14.4.   5.  Hyperlipidemia - His LDL goal is < 70. He will continue on atorvastatin 80mg  daily.    COVID-19 Education: The signs and symptoms of COVID-19 were discussed with the patient and how to seek care for testing (follow up with PCP or arrange E-visit).  The importance of social distancing was discussed today.  Patient Risk:   After full review of this patient's clinical status, I feel that they are at least moderate risk at this time.  Time:   Today, I have spent 25 minutes with the patient reviewing chart and discussing medical problems including Carotid stenosis, PAF, HTN and heart block and reviewing symptoms of COVID 19 and the ways to protect against contracting the virus with telehealth technology.      Medication Adjustments/Labs and Tests Ordered: Current medicines are reviewed at length with the patient today.  Concerns regarding medicines are outlined above.  Tests Ordered: No orders of the defined types were placed in this encounter.  Medication Changes: No orders of the defined types were placed in this encounter.   Disposition:  Follow up in 6 month(s)  Signed, Armanda Magicraci Turner, MD  12/23/2018 2:39 PM    [facility] This encounter was created in error - please disregard.

## 2018-12-22 NOTE — Telephone Encounter (Signed)
TELEPHONE CALL NOTE  This patient has been deemed a candidate for follow-up tele-health visit to limit community exposure during the Covid-19 pandemic. I spoke with the patient via phone to discuss instructions. This has been outlined on the patient's AVS (dotphrase: hcevisitinfo). The patient was advised to review the section on consent for treatment as well. The patient will receive a phone call 2-3 days prior to their E-Visit at which time consent will be verbally confirmed. A Virtual Office Visit appointment type has been scheduled for 12/23/2018 at 3:00pm with Dr Mayford Knife, with "VIDEO" or "TELEPHONE" in the appointment notes - patient prefers TELEPHONE type.  I have either confirmed the patient is active in MyChart or offered to send sign-up link to phone/email via Mychart icon beside patient's photo. Not Active  Edward Suarez, CMA 12/22/2018 1:34 PM     TELEPHONE CALL NOTE  Edward Suarez has been deemed a candidate for a follow-up tele-health visit to limit community exposure during the Covid-19 pandemic. I spoke with the patient via phone to ensure availability of phone/video source, confirm preferred email & phone number, and discuss instructions and expectations.  I reminded Edward Suarez to be prepared with any vital sign and/or heart rhythm information that could potentially be obtained via home monitoring, at the time of his visit. I reminded Edward Suarez to expect a phone call at the time of his visit if his visit.  Did the patient verbally acknowledge consent to treatment? Yes  Edward Suarez, CMA 12/22/2018 1:36 PM   CONSENT FOR TELE-HEALTH VISIT - PLEASE REVIEW  I hereby voluntarily request, consent and authorize [facility] and its employed or contracted physicians, physician assistants, nurse practitioners or other licensed health care professionals (the Practitioner), to provide me with telemedicine health care services (the "Services") as deemed necessary by the  treating Practitioner. I acknowledge and consent to receive the Services by the Practitioner via telemedicine. I understand that the telemedicine visit will involve communicating with the Practitioner through live audiovisual communication technology and the disclosure of certain medical information by electronic transmission. I acknowledge that I have been given the opportunity to request an in-person assessment or other available alternative prior to the telemedicine visit and am voluntarily participating in the telemedicine visit.  I understand that I have the right to withhold or withdraw my consent to the use of telemedicine in the course of my care at any time, without affecting my right to future care or treatment, and that the Practitioner or I may terminate the telemedicine visit at any time. I understand that I have the right to inspect all information obtained and/or recorded in the course of the telemedicine visit and may receive copies of available information for a reasonable fee.  I understand that some of the potential risks of receiving the Services via telemedicine include:  [name] Kitchen Delay or interruption in medical evaluation due to technological equipment failure or disruption; . Information transmitted may not be sufficient (e.g. poor resolution of images) to allow for appropriate medical decision making by the Practitioner; and/or  . In rare instances, security protocols could fail, causing a breach of personal health information.  Furthermore, I acknowledge that it is my responsibility to provide information about my medical history, conditions and care that is complete and accurate to the best of my ability. I acknowledge that Practitioner's advice, recommendations, and/or decision may be based on factors not within their control, such as incomplete or inaccurate data provided by me or distortions of  diagnostic images or specimens that may result from electronic transmissions. I understand  that the practice of medicine is not an exact science and that Practitioner makes no warranties or guarantees regarding treatment outcomes. I acknowledge that I will receive a copy of this consent concurrently upon execution via email to the email address I last provided but may also request a printed copy by calling the office of [facility].    I understand that my insurance will be billed for this visit.   I have read or had this consent read to me. . I understand the contents of this consent, which adequately explains the benefits and risks of the Services being provided via telemedicine.  . I have been provided ample opportunity to ask questions regarding this consent and the Services and have had my questions answered to my satisfaction. . I give my informed consent for the services to be provided through the use of telemedicine in my medical care  By participating in this telemedicine visit I agree to the above.

## 2018-12-23 ENCOUNTER — Telehealth: Payer: Self-pay

## 2018-12-23 ENCOUNTER — Encounter: Payer: Self-pay | Admitting: Cardiology

## 2018-12-23 ENCOUNTER — Other Ambulatory Visit: Payer: Self-pay

## 2018-12-23 ENCOUNTER — Encounter: Payer: PPO | Admitting: Cardiology

## 2018-12-23 ENCOUNTER — Telehealth: Payer: Self-pay | Admitting: Cardiology

## 2018-12-23 NOTE — Telephone Encounter (Signed)
TELEPHONE CALL NOTE  This patient has been deemed a candidate for follow-up tele-health visit to limit community exposure during the Covid-19 pandemic. I spoke with the patient via phone to discuss instructions. This has been outlined on the patient's AVS (dotphrase: hcevisitinfo). The patient was advised to review the section on consent for treatment as well. The patient will receive a phone call 2-3 days prior to their E-Visit at which time consent will be verbally confirmed. A Virtual Office Visit appointment type has been scheduled for 12/24/2018 at 11:30 am with Dr Mayford Knife, with "VIDEO" or "TELEPHONE" in the appointment notes - patient prefers VIDEO type.  I have either confirmed the patient is active in MyChart or offered to send sign-up link to phone/email via Mychart icon beside patient's photo.Not active  Madalyn Rob, [facility] 12/23/2018 4:08 PM      Virtual Visit Pre-Appointment Phone Call  TELEPHONE CALL NOTE  Edward Suarez has been deemed a candidate for a follow-up tele-health visit to limit community exposure during the Covid-19 pandemic. I spoke with the patient via phone to ensure availability of phone/video source, confirm preferred email & phone number, and discuss instructions and expectations.  I reminded Edward Suarez to be prepared with any vital sign and/or heart rhythm information that could potentially be obtained via home monitoring, at the time of his visit. I reminded Edward Suarez to expect a phone call at the time of his visit if his visit.  Did the patient verbally acknowledge consent to treatment? YES  Edward Suarez, CMA 12/23/2018 4:10 PM   DOWNLOADING THE WEBEX SOFTWARE TO SMARTPHONE  - If Apple, go to [employer] and type in WebEx in the search bar. Download [employer], the blue/green circle. The app is free but as with any other app downloads, their phone may require them to verify saved payment information or Apple password. The patient does NOT  have to create an account.  - If Android, ask patient to go to [employer] and type in WebEx in the search bar. Download [employer], the blue/green circle. The app is free but as with any other app downloads, their phone may require them to verify saved payment information or Android password. The patient does NOT have to create an account.   CONSENT FOR TELE-HEALTH VISIT - PLEASE REVIEW  I hereby voluntarily request, consent and authorize [facility] and its employed or contracted physicians, physician assistants, nurse practitioners or other licensed health care professionals (the Practitioner), to provide me with telemedicine health care services (the Services") as deemed necessary by the treating Practitioner. I acknowledge and consent to receive the Services by the Practitioner via telemedicine. I understand that the telemedicine visit will involve communicating with the Practitioner through live audiovisual communication technology and the disclosure of certain medical information by electronic transmission. I acknowledge that I have been given the opportunity to request an in-person assessment or other available alternative prior to the telemedicine visit and am voluntarily participating in the telemedicine visit.  I understand that I have the right to withhold or withdraw my consent to the use of telemedicine in the course of my care at any time, without affecting my right to future care or treatment, and that the Practitioner or I may terminate the telemedicine visit at any time. I understand that I have the right to inspect all information obtained and/or recorded in the course of the telemedicine visit and may receive copies of available information for a  reasonable fee.  I understand that some of the potential risks of receiving the Services via telemedicine include:   Delay or interruption in medical evaluation due to technological equipment failure or  disruption;  Information transmitted may not be sufficient (e.g. poor resolution of images) to allow for appropriate medical decision making by the Practitioner; and/or   In rare instances, security protocols could fail, causing a breach of personal health information.  Furthermore, I acknowledge that it is my responsibility to provide information about my medical history, conditions and care that is complete and accurate to the best of my ability. I acknowledge that Practitioner's advice, recommendations, and/or decision may be based on factors not within their control, such as incomplete or inaccurate data provided by me or distortions of diagnostic images or specimens that may result from electronic transmissions. I understand that the practice of medicine is not an exact science and that Practitioner makes no warranties or guarantees regarding treatment outcomes. I acknowledge that I will receive a copy of this consent concurrently upon execution via email to the email address I last provided but may also request a printed copy by calling the office of [facility].    I understand that my insurance will be billed for this visit.   I have read or had this consent read to me.  I understand the contents of this consent, which adequately explains the benefits and risks of the Services being provided via telemedicine.   I have been provided ample opportunity to ask questions regarding this consent and the Services and have had my questions answered to my satisfaction.  I give my informed consent for the services to be provided through the use of telemedicine in my medical care  By participating in this telemedicine visit I agree to the above.

## 2018-12-23 NOTE — Telephone Encounter (Signed)
Patient was calling back to do appointment

## 2018-12-23 NOTE — Progress Notes (Signed)
Virtual Visit via Video Note   This visit type was conducted due to national recommendations for restrictions regarding the COVID-19 Pandemic (e.g. social distancing) in an effort to limit this patient's exposure and mitigate transmission in our community.  Due to his co-morbid illnesses, this patient is at least at moderate risk for complications without adequate follow up.  This format is felt to be most appropriate for this patient at this time.  All issues noted in this document were discussed and addressed.  A limited physical exam was performed with this format.  Please refer to the patient's chart for his consent to telehealth for [facility]  Evaluation Performed:  Follow-up visit  This visit type was conducted due to national recommendations for restrictions regarding the COVID-19 Pandemic (e.g. social distancing).  This format is felt to be most appropriate for this patient at this time.  All issues noted in this document were discussed and addressed.  No physical exam was performed (except for noted visual exam findings with Video Visits).  Please refer to the patient's chart (MyChart message for video visits and phone note for telephone visits) for the patient's consent to telehealth for [facility].  Date:  12/24/2018   ID:  Edward Suarez, DOB [birth year], MRN [id number]  Patient Location:  Home  Provider location:   [place]  PCP:  Renford Dills, MD  Cardiologist:  Armanda Magic, MD  Electrophysiologist:  None   Chief Complaint:  HTN, PAF, Hyperlipidemia and carotid stenosis  History of Present Illness:    Edward Suarez is a 85 y.o. male who presents via audio/video conferencing for a telehealth visit today.    Edward Suarez a 86 y.o.malewith a hx of HTN, DM, dyslipidemia,complete HB s/p PPM and PVC's,mild (1-39% bilateral)carotid artery stenosis and is followed by Dr. Darrick Penna.He also has PAF noted on pacer check in 2016 and due to [facility] score of  5, was started on Eliquis.  He is here today for followup and is doing well.  He denies any chest pain or pressure, SOB, DOE, PND, orthopnea, LE edema, dizziness, palpitations or syncope. He is compliant with his meds and is tolerating meds with no SE.    The patient does not have symptoms concerning for COVID-19 infection (fever, chills, cough, or new shortness of breath).    Prior CV studies:   The following studies were reviewed today:  none  Past Medical History:  Diagnosis Date  . Allergic rhinitis, cause unspecified   . BPH (benign prostatic hyperplasia)   . Carotid artery stenosis, asymptomatic    1-39% by dopplers 09/2016 followed by Dr. Darrick Penna  . Cataracts, bilateral   . Chronic kidney disease (CKD), stage III (moderate) (HCC)   . Complete heart block (HCC) 01/23/04   s/p Medtronic PPM implanted by Dr Amil Amen  . COPD (chronic obstructive pulmonary disease) (HCC)   . Diabetes mellitus   . Glaucoma   . Hyperlipidemia   . Hypertension   . 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 Kidney stones   . PAF (paroxysmal atrial fibrillation) (HCC) 10/09/2014   Noted on pacer check with 88 mode switches and longest episode >1 hour.  Now on Apixiban for CHADS2VASC score of 5   Past Surgical History:  Procedure Laterality Date  . PACEMAKER INSERTION  2005, 04/16/12   MDT implanted by Dr Amil Amen with generator chnage (MDT Adapta L) by Dr Johney Frame 04/16/12  . PERMANENT PACEMAKER GENERATOR CHANGE N/A 04/16/2012   Procedure: PERMANENT PACEMAKER  GENERATOR CHANGE;  Surgeon: Hillis Range, MD;  Location: [facility] CATH LAB;  Service: Cardiovascular;  Laterality: N/A;     Current Meds  Medication Sig  . amLODipine (NORVASC) 5 MG tablet TAKE 1 TABLET BY MOUTH EVERY DAY (Patient taking differently: Take 5 mg by mouth daily. )  . apixaban (ELIQUIS) 2.5 MG TABS tablet Take 1 tablet (2.5 mg total) by mouth 2 (two) times daily.  [name] Kitchen atorvastatin (LIPITOR) 80 MG tablet TAKE 1 TABLET (80 MG TOTAL) BY MOUTH  EVERY EVENING.  . cetirizine (ZYRTEC) 10 MG tablet Take 10 mg by mouth daily. For allergies  . donepezil (ARICEPT) 10 MG tablet TAKE 1 TABLET BY MOUTH AT BEDTIME (Patient taking differently: Take 10 mg by mouth at bedtime. )  . fluticasone (FLONASE) 50 MCG/ACT nasal spray Place 2 sprays into both nostrils daily as needed for allergies or rhinitis.  [name] Kitchen glipiZIDE (GLUCOTROL) 5 MG tablet Take 5 mg by mouth daily before breakfast.    . levothyroxine (SYNTHROID, LEVOTHROID) 50 MCG tablet Take 50 mcg by mouth daily before breakfast.   . memantine (NAMENDA) 5 MG tablet Take 10 mg by mouth in AM and 5 mg by mouth in PM (Patient taking differently: Take 5-10 mg by mouth See admin instructions. Take 2 tablets every morning then take 1 tablet every evening)  . pioglitazone (ACTOS) 45 MG tablet Take 45 mg by mouth daily.      Allergies:   Penicillins   Social History   Tobacco Use  . Smoking status: Former Smoker    Types: Cigarettes    Last attempt to quit: 09/16/1998    Years since quitting: 20.2  . Smokeless tobacco: Never Used  Substance Use Topics  . Alcohol use: Yes    Alcohol/week: 5.0 standard drinks    Types: 2 Glasses of wine, 3 Shots of liquor per week    Comment: drinks a tequila shot daily  . Drug use: No     Family Hx: The patient's family history includes Heart attack in his father; Heart disease in his father.  ROS:   Please see the history of present illness.     All other systems reviewed and are negative.   Labs/Other Tests and Data Reviewed:    Recent Labs: 09/02/2018: BUN 20; Creatinine, Ser 1.39; Hemoglobin 14.4; Platelets 271; Potassium 4.2; Sodium 141   Recent Lipid Panel Lab Results  Component Value Date/Time   CHOL 128 12/16/2016 08:22 AM   TRIG 74 12/16/2016 08:22 AM   HDL 55 12/16/2016 08:22 AM   CHOLHDL 2.3 12/16/2016 08:22 AM   CHOLHDL 2.7 07/08/2016 08:02 AM   LDLCALC 58 12/16/2016 08:22 AM    Wt Readings from Last 3 Encounters:  12/24/18 152 lb  (68.9 kg)  11/19/18 154 lb 1.6 oz (69.9 kg)  09/02/18 155 lb (70.3 kg)     Objective:    Vital Signs:  Ht  (1.753 m)   Wt 152 lb (68.9 kg)   BMI 22.45 kg/m    CONSTITUTIONAL:  Well nourished, well developed male in no acute distress.  EYES: anicteric MOUTH: oral mucosa is pink RESPIRATORY: Normal respiratory effort, symmetric expansion CARDIOVASCULAR: No peripheral edema SKIN: No rash, lesions or ulcers MUSCULOSKELETAL: no digital cyanosis NEURO: Cranial Nerves II-XII grossly intact, moves all extremities PSYCH: Intact judgement and insight.  A&O x 3, Mood/affect appropriate   ASSESSMENT & PLAN:    1.  Bilateral carotid artery stenosis - He had dopplers done 11/2018 showing 1-39% bilateral stenosis.  He will continue on statin.  He is not on ASA due to DOAC.   2.  Complete HB - he is s/p PPM followed in device clinic.  His last remote check revealed normal device function with 1 VHR for 10 beats.  3.  HTN - he checks his BP at home and it is controlled.  He will continue on Amlodipine 5mg  daily.  4.  PAF - He thinks he is in NSR and has not had any palpitations.  He will continue on Apixaban 2.5mg  BID (reduced dose due to age>80 and creatinine>1.5).  His creatinine was 1.58 on 11/2018.   His Hbg was 14.4 on last check 08/2018  5.  Hyperlipidemia - his LDL goal is < 70.  He will continue on atorvastatin 80mg  daily. Jis LDL was 58 on 11/2018.   6.  CKD stage 3 - his creatinine is stable at 1.58 on last check last month by PCP.  COVID-19 Education: The signs and symptoms of COVID-19 were discussed with the patient and how to seek care for testing (follow up with PCP or arrange E-visit).  The importance of social distancing was discussed today.  Patient Risk:   After full review of this patient's clinical status, I feel that they are at least moderate risk at this time.  Time:   Today, I have spent 25 minutes with the patient reviewing chart and discussing medical problems  including afib, HTN, carotid stenosis and lipids and reviewing symptoms of COVID 19 and the ways to protect against contracting the virus with telehealth technology.      Medication Adjustments/Labs and Tests Ordered: Current medicines are reviewed at length with the patient today.  Concerns regarding medicines are outlined above.  Tests Ordered: No orders of the defined types were placed in this encounter.  Medication Changes: No orders of the defined types were placed in this encounter.   Disposition:  Follow up in 6 month(s)  Signed, Armanda Magicraci Mallory Schaad, MD  12/24/2018 11:29 AM    [facility]

## 2018-12-23 NOTE — Progress Notes (Signed)
Remote pacemaker transmission.   

## 2018-12-24 ENCOUNTER — Telehealth (INDEPENDENT_AMBULATORY_CARE_PROVIDER_SITE_OTHER): Payer: PPO | Admitting: Cardiology

## 2018-12-24 ENCOUNTER — Other Ambulatory Visit: Payer: Self-pay

## 2018-12-24 ENCOUNTER — Encounter: Payer: Self-pay | Admitting: Cardiology

## 2018-12-24 VITALS — Ht 69.0 in | Wt 152.0 lb

## 2018-12-24 DIAGNOSIS — I442 Atrioventricular block, complete: Secondary | ICD-10-CM

## 2018-12-24 DIAGNOSIS — I129 Hypertensive chronic kidney disease with stage 1 through stage 4 chronic kidney disease, or unspecified chronic kidney disease: Secondary | ICD-10-CM

## 2018-12-24 DIAGNOSIS — I480 Paroxysmal atrial fibrillation: Secondary | ICD-10-CM

## 2018-12-24 DIAGNOSIS — I6523 Occlusion and stenosis of bilateral carotid arteries: Secondary | ICD-10-CM

## 2018-12-24 DIAGNOSIS — N183 Chronic kidney disease, stage 3 unspecified: Secondary | ICD-10-CM

## 2018-12-24 DIAGNOSIS — I10 Essential (primary) hypertension: Secondary | ICD-10-CM

## 2018-12-24 DIAGNOSIS — E7800 Pure hypercholesterolemia, unspecified: Secondary | ICD-10-CM | POA: Diagnosis not present

## 2018-12-24 NOTE — Patient Instructions (Signed)
Medication Instructions:  Your physician recommends that you continue on your current medications as directed. Please refer to the Current Medication list given to you today.  If you need a refill on your cardiac medications before your next appointment, please call your pharmacy.   Lab work: None If you have labs (blood work) drawn today and your tests are completely normal, you will receive your results only by: . MyChart Message (if you have MyChart) OR . A paper copy in the mail If you have any lab test that is abnormal or we need to change your treatment, we will call you to review the results.  Testing/Procedures: None  Follow-Up: At [facility], you and your health needs are our priority.  As part of our continuing mission to provide you with exceptional heart care, we have created designated Provider Care Teams.  These Care Teams include your primary Cardiologist (physician) and Advanced Practice Providers (APPs -  Physician Assistants and Nurse Practitioners) who all work together to provide you with the care you need, when you need it. You will need a follow up appointment in 6 months.  Please call our office 2 months in advance to schedule this appointment.  You may see Traci Turner, MD or one of the following Advanced Practice Providers on your designated Care Team:   Brittainy Simmons, PA-C Dayna Dunn, PA-C . Michele Lenze, PA-C     

## 2018-12-28 ENCOUNTER — Ambulatory Visit: Payer: PPO | Admitting: Cardiology

## 2018-12-30 ENCOUNTER — Telehealth: Payer: Self-pay | Admitting: Cardiology

## 2019-01-06 NOTE — Telephone Encounter (Signed)
Encounter not needed

## 2019-01-07 DIAGNOSIS — E0590 Thyrotoxicosis, unspecified without thyrotoxic crisis or storm: Secondary | ICD-10-CM | POA: Diagnosis not present

## 2019-01-11 DIAGNOSIS — I480 Paroxysmal atrial fibrillation: Secondary | ICD-10-CM | POA: Diagnosis not present

## 2019-01-11 DIAGNOSIS — I10 Essential (primary) hypertension: Secondary | ICD-10-CM | POA: Diagnosis not present

## 2019-01-11 DIAGNOSIS — J439 Emphysema, unspecified: Secondary | ICD-10-CM | POA: Diagnosis not present

## 2019-01-11 DIAGNOSIS — Z7984 Long term (current) use of oral hypoglycemic drugs: Secondary | ICD-10-CM | POA: Diagnosis not present

## 2019-01-11 DIAGNOSIS — E1122 Type 2 diabetes mellitus with diabetic chronic kidney disease: Secondary | ICD-10-CM | POA: Diagnosis not present

## 2019-01-11 DIAGNOSIS — F0390 Unspecified dementia without behavioral disturbance: Secondary | ICD-10-CM | POA: Diagnosis not present

## 2019-01-11 DIAGNOSIS — E039 Hypothyroidism, unspecified: Secondary | ICD-10-CM | POA: Diagnosis not present

## 2019-01-11 DIAGNOSIS — N183 Chronic kidney disease, stage 3 (moderate): Secondary | ICD-10-CM | POA: Diagnosis not present

## 2019-01-11 DIAGNOSIS — E782 Mixed hyperlipidemia: Secondary | ICD-10-CM | POA: Diagnosis not present

## 2019-01-27 DIAGNOSIS — E119 Type 2 diabetes mellitus without complications: Secondary | ICD-10-CM | POA: Diagnosis not present

## 2019-02-10 DIAGNOSIS — E782 Mixed hyperlipidemia: Secondary | ICD-10-CM | POA: Diagnosis not present

## 2019-02-10 DIAGNOSIS — E7800 Pure hypercholesterolemia, unspecified: Secondary | ICD-10-CM | POA: Diagnosis not present

## 2019-02-10 DIAGNOSIS — E1122 Type 2 diabetes mellitus with diabetic chronic kidney disease: Secondary | ICD-10-CM | POA: Diagnosis not present

## 2019-02-10 DIAGNOSIS — F0390 Unspecified dementia without behavioral disturbance: Secondary | ICD-10-CM | POA: Diagnosis not present

## 2019-02-10 DIAGNOSIS — I10 Essential (primary) hypertension: Secondary | ICD-10-CM | POA: Diagnosis not present

## 2019-02-10 DIAGNOSIS — N183 Chronic kidney disease, stage 3 (moderate): Secondary | ICD-10-CM | POA: Diagnosis not present

## 2019-02-10 DIAGNOSIS — J439 Emphysema, unspecified: Secondary | ICD-10-CM | POA: Diagnosis not present

## 2019-02-10 DIAGNOSIS — E039 Hypothyroidism, unspecified: Secondary | ICD-10-CM | POA: Diagnosis not present

## 2019-02-10 DIAGNOSIS — I480 Paroxysmal atrial fibrillation: Secondary | ICD-10-CM | POA: Diagnosis not present

## 2019-02-15 ENCOUNTER — Telehealth: Payer: Self-pay | Admitting: *Deleted

## 2019-02-15 NOTE — Telephone Encounter (Signed)
Due to current COVID 19 pandemic, our office is severely reducing in office visits until further notice, in order to minimize the risk to our patients and healthcare providers. Unable to get in contact with the patient to convert their office visit with Megan on 02/17/2019 into a doxy.me visit. I left a voicemail asking the patient to return my call. Office number was provided.     If patient calls back please convert their office visit into a doxy.me visit.      

## 2019-02-15 NOTE — Telephone Encounter (Signed)
Pt's wife called and consented to a Virtual Visit. Link was sent to the confirmed e-mail in chart.  Pt understands that although there may be some limitations with this type of visit, we will take all precautions to reduce any security or privacy concerns.  Pt understands that this will be treated like an in office visit and we will file with pt's insurance, and there may be a patient responsible charge related to this service.

## 2019-02-15 NOTE — Telephone Encounter (Signed)
Noted  

## 2019-02-17 ENCOUNTER — Other Ambulatory Visit: Payer: Self-pay

## 2019-02-17 ENCOUNTER — Ambulatory Visit (INDEPENDENT_AMBULATORY_CARE_PROVIDER_SITE_OTHER): Payer: PPO | Admitting: Adult Health

## 2019-02-17 ENCOUNTER — Encounter: Payer: Self-pay | Admitting: Adult Health

## 2019-02-17 DIAGNOSIS — R413 Other amnesia: Secondary | ICD-10-CM | POA: Diagnosis not present

## 2019-02-17 MED ORDER — DONEPEZIL HCL 10 MG PO TABS: 10.0000 mg | ORAL_TABLET | Freq: Every day | ORAL | 3 refills | Status: DC

## 2019-02-17 MED ORDER — MEMANTINE HCL 10 MG PO TABS: 10.0000 mg | ORAL_TABLET | Freq: Two times a day (BID) | ORAL | 3 refills | Status: DC

## 2019-02-17 NOTE — Progress Notes (Signed)
PATIENT: Edward Suarez DOB: 07-07-1932  REASON FOR VISIT: follow up HISTORY FROM: patient  Virtual Visit via Video Note  I connected with Edward Suarez on 02/17/19 at  2:00 PM EDT by a video enabled telemedicine application located remotely at [facility] and verified that I am speaking with the correct person using two identifiers who was located at their own home.   I discussed the limitations of evaluation and management by telemedicine and the availability of in person appointments. The patient expressed understanding and agreed to proceed.   PATIENT: Edward Suarez DOB: 1932/08/20  REASON FOR VISIT: follow up HISTORY FROM: patient  HISTORY OF PRESENT ILLNESS: Today 02/17/19 Edward Suarez is an 85 year old male with a history of memory disturbance.  He returns today for a virtual visit.[name] Kitchen  He is currently on Aricept and tolerating it well.  He feels that his memory has gotten slightly worse.  Denies hallucinations or agitation.  He is able to complete all ADLs independently.  He continues to operate a motor vehicle without difficulty.  Reports good appetite.  He joins me today for a virtual visit.   HISTORY 06/01/18: Edward Suarez is an 85 year old male with a history of memory loss. He returns today for follow-up.  He says that his memory has remained stable.  He is able to complete all ADLs independently.  He operates a Librarian, academic without difficulty.  He reports that he does use his GPS to avoid getting lost.  Reports good appetite.  Denies any trouble sleeping.  Denies any changes in his mood or behavior.  He remains on Aricept.  He returns today for evaluation.  REVIEW OF SYSTEMS: Out of a complete 14 system review of symptoms, the patient complains only of the following symptoms, and all other reviewed systems are negative.  See HPI  ALLERGIES: Allergies  Allergen Reactions   Penicillins Rash    HOME MEDICATIONS: Outpatient Medications Prior to  Visit  Medication Sig Dispense Refill   amLODipine (NORVASC) 5 MG tablet TAKE 1 TABLET BY MOUTH EVERY DAY (Patient taking differently: Take 5 mg by mouth daily. ) 90 tablet 2   apixaban (ELIQUIS) 2.5 MG TABS tablet Take 1 tablet (2.5 mg total) by mouth 2 (two) times daily. 180 tablet 3   atorvastatin (LIPITOR) 80 MG tablet TAKE 1 TABLET (80 MG TOTAL) BY MOUTH EVERY EVENING. 90 tablet 2   cetirizine (ZYRTEC) 10 MG tablet Take 10 mg by mouth daily. For allergies     donepezil (ARICEPT) 10 MG tablet TAKE 1 TABLET BY MOUTH AT BEDTIME (Patient taking differently: Take 10 mg by mouth at bedtime. ) 90 tablet 2   fluticasone (FLONASE) 50 MCG/ACT nasal spray Place 2 sprays into both nostrils daily as needed for allergies or rhinitis.     glipiZIDE (GLUCOTROL) 5 MG tablet Take 5 mg by mouth daily before breakfast.       levothyroxine (SYNTHROID, LEVOTHROID) 50 MCG tablet Take 50 mcg by mouth daily before breakfast.   3   memantine (NAMENDA) 5 MG tablet Take 10 mg by mouth in AM and 5 mg by mouth in PM (Patient taking differently: Take 5-10 mg by mouth See admin instructions. Take 2 tablets every morning then take 1 tablet every evening) 270 tablet 1   pioglitazone (ACTOS) 45 MG tablet Take 45 mg by mouth daily.      No facility-administered medications prior to visit.     PAST MEDICAL HISTORY: Past Medical History:  Diagnosis Date   Allergic rhinitis, cause unspecified    BPH (benign prostatic hyperplasia)    Carotid artery stenosis, asymptomatic    1-39% by dopplers 09/2016 followed by Dr. Darrick Penna   Cataracts, bilateral    Chronic kidney disease (CKD), stage III (moderate) (HCC)    Complete heart block (HCC) 01/23/04   s/p Medtronic PPM implanted by Dr Amil Amen   COPD (chronic obstructive pulmonary disease) (HCC)    Diabetes mellitus    Glaucoma    Hyperlipidemia    Hypertension    Insomnia due to medical condition 08/10/2014   Kidney stones    PAF (paroxysmal atrial  fibrillation) (HCC) 10/09/2014   Noted on pacer check with 88 mode switches and longest episode >1 hour.  Now on Apixiban for CHADS2VASC score of 5    PAST SURGICAL HISTORY: Past Surgical History:  Procedure Laterality Date   PACEMAKER INSERTION  2005, 04/16/12   MDT implanted by Dr Amil Amen with generator chnage (MDT Adapta L) by Dr Johney Frame 04/16/12   PERMANENT PACEMAKER GENERATOR CHANGE N/A 04/16/2012   Procedure: PERMANENT PACEMAKER GENERATOR CHANGE;  Surgeon: Hillis Range, MD;  Location[facility] CATH LAB;  Service: Cardiovascular;  Laterality: N/A;    FAMILY HISTORY: Family History  Problem Relation Age of Onset   Heart disease Father    Heart attack Father     SOCIAL HISTORY: Social History   Socioeconomic History   Marital status: Married    Spouse name: [name]   Number of children: 2   Years of education: BS   Highest education level: Not on file  Occupational History   Not on file  Social Needs   Financial resource strain: Not on file   Food insecurity:    Worry: Not on file    Inability: Not on file   Transportation needs:    Medical: Not on file    Non-medical: Not on file  Tobacco Use   Smoking status: Former Smoker    Types: Cigarettes    Last attempt to quit: 09/16/1998    Years since quitting: 20.4   Smokeless tobacco: Never Used  Substance and Sexual Activity   Alcohol use: Yes    Alcohol/week: 5.0 standard drinks    Types: 2 Glasses of wine, 3 Shots of liquor per week    Comment: drinks a tequila shot daily   Drug use: No   Sexual activity: Not on file  Lifestyle   Physical activity:    Days per week: Not on file    Minutes per session: Not on file   Stress: Not on file  Relationships   Social connections:    Talks on phone: Not on file    Gets together: Not on file    Attends religious service: Not on file    Active member of club or organization: Not on file    Attends meetings of clubs or organizations: Not on file    Relationship  status: Not on file   Intimate partner violence:    Fear of current or ex partner: Not on file    Emotionally abused: Not on file    Physically abused: Not on file    Forced sexual activity: Not on file  Other Topics Concern   Not on file  Social History Narrative   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   Patient is married with 2 children.   Patient is right handed.   Patient has BS degree.   Patient drinks 1 cup  daily.            PHYSICAL EXAM  MMSE - Mini Mental State Exam 02/17/2019 06/01/2018 11/19/2017  Orientation to time 2 3 4   Orientation to Place 5 4 4   Registration 3 3 3   Attention/ Calculation 0 1 2  Attention/Calculation-comments - - couldnt do the numbers  Recall 1 0 1  Language- name 2 objects 2 2 2   Language- repeat 1 1 1   Language- follow 3 step command 3 2 3   Language- follow 3 step command-comments - used left hand -  Language- read & follow direction 1 1 1   Write a sentence 1 1 1   Copy design 1 0 0  Total score 20 18 22     Generalized: Well developed, in no acute distress   Neurological examination  Mentation: Alert oriented to time, place, history taking. Follows all commands speech and language fluent Cranial nerve II-XII:Extraocular movements were full. Facial symmetry noted. uvula tongue midline. Head turning and shoulder shrug  were normal and symmetric. Motor: Good strength throughout subjectively per patient Sensory: Sensory testing is intact to soft touch on all 4 extremities subjectively per patient Coordination: Cerebellar testing reveals good finger-nose-finger  Gait and station: Patient is able to stand from a seated position. gait is normal.  Reflexes: UTA  DIAGNOSTIC DATA (LABS, IMAGING, TESTING) - I reviewed patient records, labs, notes, testing and imaging myself where available.  Lab Results  Component Value Date   WBC 9.2 09/02/2018   HGB 14.4 09/02/2018   HCT 44.9 09/02/2018   MCV 83.9 09/02/2018   PLT 271  09/02/2018      Component Value Date/Time   NA 141 09/02/2018 0155   NA 142 06/15/2018 0818   K 4.2 09/02/2018 0155   CL 108 09/02/2018 0155   CO2 22 09/02/2018 0155   GLUCOSE 106 (H) 09/02/2018 0155   BUN 20 09/02/2018 0155   BUN 20 06/15/2018 0818   CREATININE 1.39 (H) 09/02/2018 0155   CALCIUM 9.4 09/02/2018 0155   PROT 5.9 (L) 12/16/2016 0822   ALBUMIN 4.3 12/16/2016 0822   AST 21 12/16/2016 0822   ALT 16 12/16/2016 0822   ALKPHOS 61 12/16/2016 0822   BILITOT 0.3 12/16/2016 0822   GFRNONAA 46 (L) 09/02/2018 0155   GFRAA 54 (L) 09/02/2018 0155   Lab Results  Component Value Date   CHOL 128 12/16/2016   HDL 55 12/16/2016   LDLCALC 58 12/16/2016   TRIG 74 12/16/2016   CHOLHDL 2.3 12/16/2016   No results found for: HGBA1C No results found for: VITAMINB12 No results found for: TSH    ASSESSMENT AND PLAN 85 y.o. year old male  has a past medical history of Allergic rhinitis, cause unspecified, BPH (benign prostatic hyperplasia), Carotid artery stenosis, asymptomatic, Cataracts, bilateral, Chronic kidney disease (CKD), stage III (moderate) (HCC), Complete heart block (HCC) (01/23/04), COPD (chronic obstructive pulmonary disease) (HCC), Diabetes mellitus, Glaucoma, Hyperlipidemia, Hypertension, Insomnia due to medical condition (08/10/2014), Kidney stones, and PAF (paroxysmal atrial fibrillation) (HCC) (10/09/2014). here with:  1.  Memory disturbance  The patient will continue on Aricept.  I will increase Namenda to 10 mg twice a day.  His memory score has remained stable.  He is advised that if his symptoms worsen or he develops new symptoms he should let us know.  He will follow-up in 6 months or sooner if needed.   I spent 15 minutes with the patient this time was spent completing the memory exam and discussing plan of  care.   Butch Penny, MSN, NP-C 02/17/2019, 11:48 AM [facility] [address] [place], [place] [address] [phone]

## 2019-02-18 DIAGNOSIS — E0590 Thyrotoxicosis, unspecified without thyrotoxic crisis or storm: Secondary | ICD-10-CM | POA: Diagnosis not present

## 2019-02-26 ENCOUNTER — Telehealth: Payer: Self-pay | Admitting: Adult Health

## 2019-02-26 NOTE — Telephone Encounter (Signed)
[place] from [employer] called needing an updated prescription for the increased dosage of the pts memantine (NAMENDA) 10 MG tablet faxed in to [phone]

## 2019-03-01 NOTE — Telephone Encounter (Signed)
[place],  The prescription escribed 02-17-19 is the most resent dose.  She verbalized understanding.

## 2019-03-15 DIAGNOSIS — E039 Hypothyroidism, unspecified: Secondary | ICD-10-CM | POA: Diagnosis not present

## 2019-03-15 DIAGNOSIS — J439 Emphysema, unspecified: Secondary | ICD-10-CM | POA: Diagnosis not present

## 2019-03-15 DIAGNOSIS — I10 Essential (primary) hypertension: Secondary | ICD-10-CM | POA: Diagnosis not present

## 2019-03-15 DIAGNOSIS — E7800 Pure hypercholesterolemia, unspecified: Secondary | ICD-10-CM | POA: Diagnosis not present

## 2019-03-15 DIAGNOSIS — E1122 Type 2 diabetes mellitus with diabetic chronic kidney disease: Secondary | ICD-10-CM | POA: Diagnosis not present

## 2019-03-15 DIAGNOSIS — E782 Mixed hyperlipidemia: Secondary | ICD-10-CM | POA: Diagnosis not present

## 2019-03-15 DIAGNOSIS — I480 Paroxysmal atrial fibrillation: Secondary | ICD-10-CM | POA: Diagnosis not present

## 2019-03-15 DIAGNOSIS — N183 Chronic kidney disease, stage 3 (moderate): Secondary | ICD-10-CM | POA: Diagnosis not present

## 2019-03-15 DIAGNOSIS — F0390 Unspecified dementia without behavioral disturbance: Secondary | ICD-10-CM | POA: Diagnosis not present

## 2019-03-16 ENCOUNTER — Ambulatory Visit (INDEPENDENT_AMBULATORY_CARE_PROVIDER_SITE_OTHER): Payer: PPO | Admitting: *Deleted

## 2019-03-16 ENCOUNTER — Telehealth: Payer: Self-pay

## 2019-03-16 DIAGNOSIS — I442 Atrioventricular block, complete: Secondary | ICD-10-CM | POA: Diagnosis not present

## 2019-03-16 LAB — CUP PACEART REMOTE DEVICE CHECK
Battery Impedance: 1339 Ohm
Battery Remaining Longevity: 38 mo
Battery Voltage: 2.76 V
Brady Statistic AP VP Percent: 94 %
Brady Statistic AP VS Percent: 0 %
Brady Statistic AS VP Percent: 6 %
Brady Statistic AS VS Percent: 0 %
Date Time Interrogation Session: 20200630174241
Implantable Lead Implant Date: 20050509
Implantable Lead Implant Date: 20050509
Implantable Lead Location: 753859
Implantable Lead Location: 753860
Implantable Lead Model: 5076
Implantable Lead Model: 5092
Implantable Pulse Generator Implant Date: 20130801
Lead Channel Impedance Value: 393 Ohm
Lead Channel Impedance Value: 554 Ohm
Lead Channel Pacing Threshold Amplitude: 0.5 V
Lead Channel Pacing Threshold Amplitude: 1.125 V
Lead Channel Pacing Threshold Pulse Width: 0.4 ms
Lead Channel Pacing Threshold Pulse Width: 0.4 ms
Lead Channel Setting Pacing Amplitude: 2 V
Lead Channel Setting Pacing Amplitude: 2.75 V
Lead Channel Setting Pacing Pulse Width: 0.4 ms
Lead Channel Setting Sensing Sensitivity: 4 mV

## 2019-03-16 NOTE — Telephone Encounter (Signed)
Pt wife called to let Margarita Grizzle know that the pt did indeed sent his transmission.

## 2019-03-27 NOTE — Progress Notes (Signed)
Remote pacemaker transmission.   

## 2019-03-31 DIAGNOSIS — E0590 Thyrotoxicosis, unspecified without thyrotoxic crisis or storm: Secondary | ICD-10-CM | POA: Diagnosis not present

## 2019-04-06 DIAGNOSIS — I480 Paroxysmal atrial fibrillation: Secondary | ICD-10-CM | POA: Diagnosis not present

## 2019-04-06 DIAGNOSIS — E039 Hypothyroidism, unspecified: Secondary | ICD-10-CM | POA: Diagnosis not present

## 2019-04-06 DIAGNOSIS — J439 Emphysema, unspecified: Secondary | ICD-10-CM | POA: Diagnosis not present

## 2019-04-06 DIAGNOSIS — E1122 Type 2 diabetes mellitus with diabetic chronic kidney disease: Secondary | ICD-10-CM | POA: Diagnosis not present

## 2019-04-06 DIAGNOSIS — F0390 Unspecified dementia without behavioral disturbance: Secondary | ICD-10-CM | POA: Diagnosis not present

## 2019-04-06 DIAGNOSIS — E7800 Pure hypercholesterolemia, unspecified: Secondary | ICD-10-CM | POA: Diagnosis not present

## 2019-04-06 DIAGNOSIS — E782 Mixed hyperlipidemia: Secondary | ICD-10-CM | POA: Diagnosis not present

## 2019-04-06 DIAGNOSIS — I10 Essential (primary) hypertension: Secondary | ICD-10-CM | POA: Diagnosis not present

## 2019-04-06 DIAGNOSIS — N183 Chronic kidney disease, stage 3 (moderate): Secondary | ICD-10-CM | POA: Diagnosis not present

## 2019-05-07 DIAGNOSIS — E782 Mixed hyperlipidemia: Secondary | ICD-10-CM | POA: Diagnosis not present

## 2019-05-07 DIAGNOSIS — E039 Hypothyroidism, unspecified: Secondary | ICD-10-CM | POA: Diagnosis not present

## 2019-05-07 DIAGNOSIS — N183 Chronic kidney disease, stage 3 (moderate): Secondary | ICD-10-CM | POA: Diagnosis not present

## 2019-05-07 DIAGNOSIS — I480 Paroxysmal atrial fibrillation: Secondary | ICD-10-CM | POA: Diagnosis not present

## 2019-05-07 DIAGNOSIS — E1122 Type 2 diabetes mellitus with diabetic chronic kidney disease: Secondary | ICD-10-CM | POA: Diagnosis not present

## 2019-05-07 DIAGNOSIS — J439 Emphysema, unspecified: Secondary | ICD-10-CM | POA: Diagnosis not present

## 2019-05-07 DIAGNOSIS — E7800 Pure hypercholesterolemia, unspecified: Secondary | ICD-10-CM | POA: Diagnosis not present

## 2019-05-07 DIAGNOSIS — I10 Essential (primary) hypertension: Secondary | ICD-10-CM | POA: Diagnosis not present

## 2019-05-07 DIAGNOSIS — F0390 Unspecified dementia without behavioral disturbance: Secondary | ICD-10-CM | POA: Diagnosis not present

## 2019-05-12 DIAGNOSIS — E0590 Thyrotoxicosis, unspecified without thyrotoxic crisis or storm: Secondary | ICD-10-CM | POA: Diagnosis not present

## 2019-06-10 DIAGNOSIS — E0590 Thyrotoxicosis, unspecified without thyrotoxic crisis or storm: Secondary | ICD-10-CM | POA: Diagnosis not present

## 2019-06-11 DIAGNOSIS — I10 Essential (primary) hypertension: Secondary | ICD-10-CM | POA: Diagnosis not present

## 2019-06-11 DIAGNOSIS — E039 Hypothyroidism, unspecified: Secondary | ICD-10-CM | POA: Diagnosis not present

## 2019-06-11 DIAGNOSIS — E782 Mixed hyperlipidemia: Secondary | ICD-10-CM | POA: Diagnosis not present

## 2019-06-11 DIAGNOSIS — E7800 Pure hypercholesterolemia, unspecified: Secondary | ICD-10-CM | POA: Diagnosis not present

## 2019-06-11 DIAGNOSIS — I480 Paroxysmal atrial fibrillation: Secondary | ICD-10-CM | POA: Diagnosis not present

## 2019-06-11 DIAGNOSIS — N183 Chronic kidney disease, stage 3 (moderate): Secondary | ICD-10-CM | POA: Diagnosis not present

## 2019-06-11 DIAGNOSIS — E1122 Type 2 diabetes mellitus with diabetic chronic kidney disease: Secondary | ICD-10-CM | POA: Diagnosis not present

## 2019-06-11 DIAGNOSIS — J439 Emphysema, unspecified: Secondary | ICD-10-CM | POA: Diagnosis not present

## 2019-06-11 DIAGNOSIS — F0390 Unspecified dementia without behavioral disturbance: Secondary | ICD-10-CM | POA: Diagnosis not present

## 2019-06-15 ENCOUNTER — Ambulatory Visit (INDEPENDENT_AMBULATORY_CARE_PROVIDER_SITE_OTHER): Payer: PPO | Admitting: *Deleted

## 2019-06-15 DIAGNOSIS — I480 Paroxysmal atrial fibrillation: Secondary | ICD-10-CM

## 2019-06-15 DIAGNOSIS — I442 Atrioventricular block, complete: Secondary | ICD-10-CM

## 2019-06-19 LAB — CUP PACEART REMOTE DEVICE CHECK
Battery Impedance: 1452 Ohm
Battery Remaining Longevity: 34 mo
Battery Voltage: 2.76 V
Brady Statistic AP VP Percent: 94 %
Brady Statistic AP VS Percent: 0 %
Brady Statistic AS VP Percent: 6 %
Brady Statistic AS VS Percent: 0 %
Date Time Interrogation Session: 20201001195527
Implantable Lead Implant Date: 20050509
Implantable Lead Implant Date: 20050509
Implantable Lead Location: 753859
Implantable Lead Location: 753860
Implantable Lead Model: 5076
Implantable Lead Model: 5092
Implantable Pulse Generator Implant Date: 20130801
Lead Channel Impedance Value: 405 Ohm
Lead Channel Impedance Value: 567 Ohm
Lead Channel Pacing Threshold Amplitude: 0.625 V
Lead Channel Pacing Threshold Amplitude: 1.25 V
Lead Channel Pacing Threshold Pulse Width: 0.4 ms
Lead Channel Pacing Threshold Pulse Width: 0.4 ms
Lead Channel Setting Pacing Amplitude: 2 V
Lead Channel Setting Pacing Amplitude: 3.25 V
Lead Channel Setting Pacing Pulse Width: 0.4 ms
Lead Channel Setting Sensing Sensitivity: 4 mV

## 2019-06-25 NOTE — Progress Notes (Signed)
Remote pacemaker transmission.   

## 2019-07-05 DIAGNOSIS — H26491 Other secondary cataract, right eye: Secondary | ICD-10-CM | POA: Diagnosis not present

## 2019-07-05 DIAGNOSIS — H35371 Puckering of macula, right eye: Secondary | ICD-10-CM | POA: Diagnosis not present

## 2019-07-09 DIAGNOSIS — H26491 Other secondary cataract, right eye: Secondary | ICD-10-CM | POA: Diagnosis not present

## 2019-07-14 DIAGNOSIS — Z23 Encounter for immunization: Secondary | ICD-10-CM | POA: Diagnosis not present

## 2019-07-16 DIAGNOSIS — I480 Paroxysmal atrial fibrillation: Secondary | ICD-10-CM | POA: Diagnosis not present

## 2019-07-16 DIAGNOSIS — F0390 Unspecified dementia without behavioral disturbance: Secondary | ICD-10-CM | POA: Diagnosis not present

## 2019-07-16 DIAGNOSIS — E782 Mixed hyperlipidemia: Secondary | ICD-10-CM | POA: Diagnosis not present

## 2019-07-16 DIAGNOSIS — N1831 Chronic kidney disease, stage 3a: Secondary | ICD-10-CM | POA: Diagnosis not present

## 2019-07-16 DIAGNOSIS — J439 Emphysema, unspecified: Secondary | ICD-10-CM | POA: Diagnosis not present

## 2019-07-16 DIAGNOSIS — I10 Essential (primary) hypertension: Secondary | ICD-10-CM | POA: Diagnosis not present

## 2019-07-16 DIAGNOSIS — E039 Hypothyroidism, unspecified: Secondary | ICD-10-CM | POA: Diagnosis not present

## 2019-07-16 DIAGNOSIS — E7800 Pure hypercholesterolemia, unspecified: Secondary | ICD-10-CM | POA: Diagnosis not present

## 2019-07-16 DIAGNOSIS — E1122 Type 2 diabetes mellitus with diabetic chronic kidney disease: Secondary | ICD-10-CM | POA: Diagnosis not present

## 2019-07-21 DIAGNOSIS — H26491 Other secondary cataract, right eye: Secondary | ICD-10-CM | POA: Diagnosis not present

## 2019-08-06 DIAGNOSIS — I10 Essential (primary) hypertension: Secondary | ICD-10-CM | POA: Diagnosis not present

## 2019-08-06 DIAGNOSIS — N1831 Chronic kidney disease, stage 3a: Secondary | ICD-10-CM | POA: Diagnosis not present

## 2019-08-06 DIAGNOSIS — E7800 Pure hypercholesterolemia, unspecified: Secondary | ICD-10-CM | POA: Diagnosis not present

## 2019-08-06 DIAGNOSIS — E1122 Type 2 diabetes mellitus with diabetic chronic kidney disease: Secondary | ICD-10-CM | POA: Diagnosis not present

## 2019-08-06 DIAGNOSIS — E039 Hypothyroidism, unspecified: Secondary | ICD-10-CM | POA: Diagnosis not present

## 2019-08-06 DIAGNOSIS — J439 Emphysema, unspecified: Secondary | ICD-10-CM | POA: Diagnosis not present

## 2019-08-06 DIAGNOSIS — E782 Mixed hyperlipidemia: Secondary | ICD-10-CM | POA: Diagnosis not present

## 2019-08-06 DIAGNOSIS — F0390 Unspecified dementia without behavioral disturbance: Secondary | ICD-10-CM | POA: Diagnosis not present

## 2019-08-06 DIAGNOSIS — I480 Paroxysmal atrial fibrillation: Secondary | ICD-10-CM | POA: Diagnosis not present

## 2019-08-19 ENCOUNTER — Ambulatory Visit: Payer: PPO | Admitting: Adult Health

## 2019-09-14 ENCOUNTER — Ambulatory Visit (INDEPENDENT_AMBULATORY_CARE_PROVIDER_SITE_OTHER): Payer: PPO | Admitting: *Deleted

## 2019-09-14 DIAGNOSIS — I480 Paroxysmal atrial fibrillation: Secondary | ICD-10-CM | POA: Diagnosis not present

## 2019-09-14 LAB — CUP PACEART REMOTE DEVICE CHECK
Battery Impedance: 1593 Ohm
Battery Remaining Longevity: 31 mo
Battery Voltage: 2.75 V
Brady Statistic AP VP Percent: 94 %
Brady Statistic AP VS Percent: 0 %
Brady Statistic AS VP Percent: 5 %
Brady Statistic AS VS Percent: 0 %
Date Time Interrogation Session: 20201229105518
Implantable Lead Implant Date: 20050509
Implantable Lead Implant Date: 20050509
Implantable Lead Location: 753859
Implantable Lead Location: 753860
Implantable Lead Model: 5076
Implantable Lead Model: 5092
Implantable Pulse Generator Implant Date: 20130801
Lead Channel Impedance Value: 393 Ohm
Lead Channel Impedance Value: 563 Ohm
Lead Channel Pacing Threshold Amplitude: 0.625 V
Lead Channel Pacing Threshold Amplitude: 1.25 V
Lead Channel Pacing Threshold Pulse Width: 0.4 ms
Lead Channel Pacing Threshold Pulse Width: 0.4 ms
Lead Channel Setting Pacing Amplitude: 2 V
Lead Channel Setting Pacing Amplitude: 3.25 V
Lead Channel Setting Pacing Pulse Width: 0.4 ms
Lead Channel Setting Sensing Sensitivity: 4 mV

## 2019-09-15 ENCOUNTER — Telehealth: Payer: Self-pay | Admitting: Emergency Medicine

## 2019-09-15 ENCOUNTER — Telehealth: Payer: Self-pay | Admitting: Cardiology

## 2019-09-15 NOTE — Telephone Encounter (Signed)
Confirmed that remote transmission was received from 09/14/19 with AMS episodes that appear to be AF that lasted < 1 min , VHR was AF/RVR, NSVT episode. Wife reports there has been no change in patient condition. Confirmed patient is taking his Eliquis as ordered.

## 2019-09-15 NOTE — Telephone Encounter (Signed)
  1. Has your device fired? no  2. Is you device beeping? no  3. Are you experiencing draining or swelling at device site? no  4. Are you calling to see if we received your device transmission? YES  5. Have you passed out? No  Wife of patient called. She wanted to make sure the home remote check went through. There were some issues with the last one     Please route to [place]

## 2019-09-15 NOTE — Telephone Encounter (Signed)
Phone note opened in error.CLR

## 2019-09-16 DIAGNOSIS — F0390 Unspecified dementia without behavioral disturbance: Secondary | ICD-10-CM | POA: Diagnosis not present

## 2019-09-16 DIAGNOSIS — I480 Paroxysmal atrial fibrillation: Secondary | ICD-10-CM | POA: Diagnosis not present

## 2019-09-16 DIAGNOSIS — N1831 Chronic kidney disease, stage 3a: Secondary | ICD-10-CM | POA: Diagnosis not present

## 2019-09-16 DIAGNOSIS — J439 Emphysema, unspecified: Secondary | ICD-10-CM | POA: Diagnosis not present

## 2019-09-16 DIAGNOSIS — I10 Essential (primary) hypertension: Secondary | ICD-10-CM | POA: Diagnosis not present

## 2019-09-16 DIAGNOSIS — E7800 Pure hypercholesterolemia, unspecified: Secondary | ICD-10-CM | POA: Diagnosis not present

## 2019-09-16 DIAGNOSIS — E039 Hypothyroidism, unspecified: Secondary | ICD-10-CM | POA: Diagnosis not present

## 2019-09-16 DIAGNOSIS — E782 Mixed hyperlipidemia: Secondary | ICD-10-CM | POA: Diagnosis not present

## 2019-09-16 DIAGNOSIS — E1122 Type 2 diabetes mellitus with diabetic chronic kidney disease: Secondary | ICD-10-CM | POA: Diagnosis not present

## 2019-09-22 DIAGNOSIS — F0390 Unspecified dementia without behavioral disturbance: Secondary | ICD-10-CM | POA: Diagnosis not present

## 2019-09-22 DIAGNOSIS — I480 Paroxysmal atrial fibrillation: Secondary | ICD-10-CM | POA: Diagnosis not present

## 2019-09-22 DIAGNOSIS — E1122 Type 2 diabetes mellitus with diabetic chronic kidney disease: Secondary | ICD-10-CM | POA: Diagnosis not present

## 2019-09-22 DIAGNOSIS — J439 Emphysema, unspecified: Secondary | ICD-10-CM | POA: Diagnosis not present

## 2019-09-22 DIAGNOSIS — E782 Mixed hyperlipidemia: Secondary | ICD-10-CM | POA: Diagnosis not present

## 2019-09-22 DIAGNOSIS — I10 Essential (primary) hypertension: Secondary | ICD-10-CM | POA: Diagnosis not present

## 2019-09-22 DIAGNOSIS — N1831 Chronic kidney disease, stage 3a: Secondary | ICD-10-CM | POA: Diagnosis not present

## 2019-09-22 DIAGNOSIS — E039 Hypothyroidism, unspecified: Secondary | ICD-10-CM | POA: Diagnosis not present

## 2019-09-22 DIAGNOSIS — E7800 Pure hypercholesterolemia, unspecified: Secondary | ICD-10-CM | POA: Diagnosis not present

## 2019-09-28 ENCOUNTER — Ambulatory Visit (INDEPENDENT_AMBULATORY_CARE_PROVIDER_SITE_OTHER): Payer: PPO | Admitting: Adult Health

## 2019-09-28 ENCOUNTER — Encounter: Payer: Self-pay | Admitting: Adult Health

## 2019-09-28 VITALS — BP 132/61 | HR 72 | Temp 96.6°F | Ht 68.0 in | Wt 153.8 lb

## 2019-09-28 DIAGNOSIS — F0390 Unspecified dementia without behavioral disturbance: Secondary | ICD-10-CM | POA: Diagnosis not present

## 2019-09-28 NOTE — Progress Notes (Signed)
PATIENT: Edward Suarez DOB: Mar 08, 1932  REASON FOR VISIT: follow up HISTORY FROM: patient  HISTORY OF PRESENT ILLNESS: Today 09/28/19:  Edward Suarez is an 86 year old male with a history of memory disturbance.  He returns today for follow-up.  He is currently on Aricept and tolerating it well.  He is able to complete all ADLs independently.  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  His wife completes the finances.  He does help with household chores.  His wife has noticed a decline in his memory since the last visit.  The patient continues on Aricept and Namenda.  He reports he also has a sister that has dementia.  He returns today for evaluation.  HISTORY 02/17/19 Edward Suarez is an 86 year old male with a history of memory disturbance.  He returns today for a virtual visit.[name] Kitchen  He is currently on Aricept and tolerating it well.  He feels that his memory has gotten slightly worse.  Denies hallucinations or agitation.  He is able to complete all ADLs independently.  He continues to operate a motor vehicle without difficulty.  Reports good appetite.  He joins me today for a virtual visit.   REVIEW OF SYSTEMS: Out of a complete 14 system review of symptoms, the patient complains only of the following symptoms, and all other reviewed systems are negative.  See HPI   ALLERGIES: Allergies  Allergen Reactions  . Penicillins Rash    HOME MEDICATIONS: Outpatient Medications Prior to Visit  Medication Sig Dispense Refill  . amLODipine (NORVASC) 5 MG tablet TAKE 1 TABLET BY MOUTH EVERY DAY (Patient taking differently: Take 5 mg by mouth daily. ) 90 tablet 2  . atorvastatin (LIPITOR) 80 MG tablet TAKE 1 TABLET (80 MG TOTAL) BY MOUTH EVERY EVENING. 90 tablet 2  . Calcium Carb-Cholecalciferol (CALCIUM 1000 + D PO) Take by mouth. OTC    . cetirizine (ZYRTEC) 10 MG tablet Take 10 mg by mouth daily. For allergies. Takes prn    . donepezil (ARICEPT) 10 MG tablet Take 1 tablet (10 mg total) by mouth at  bedtime. 90 tablet 3  . fluticasone (FLONASE) 50 MCG/ACT nasal spray Place 2 sprays into both nostrils daily as needed for allergies or rhinitis.    [name] Kitchen glipiZIDE (GLUCOTROL) 5 MG tablet Take 5 mg by mouth daily before breakfast.      . levothyroxine (SYNTHROID, LEVOTHROID) 50 MCG tablet Take 50 mcg by mouth daily before breakfast.   3  . memantine (NAMENDA) 10 MG tablet Take 1 tablet (10 mg total) by mouth 2 (two) times daily. 180 tablet 3  . pioglitazone (ACTOS) 45 MG tablet Take 45 mg by mouth daily.     [name] Kitchen apixaban (ELIQUIS) 2.5 MG TABS tablet Take 1 tablet (2.5 mg total) by mouth 2 (two) times daily. 180 tablet 3   No facility-administered medications prior to visit.    PAST MEDICAL HISTORY: Past Medical History:  Diagnosis Date  . Allergic rhinitis, cause unspecified   . BPH (benign prostatic hyperplasia)   . Carotid artery stenosis, asymptomatic    1-39% by dopplers 09/2016 followed by Dr. Darrick Penna  . Cataracts, bilateral   . Chronic kidney disease (CKD), stage III (moderate)   . Complete heart block (HCC) 01/23/04   s/p Medtronic PPM implanted by Dr Amil Amen  . COPD (chronic obstructive pulmonary disease) (HCC)   . Diabetes mellitus   . Glaucoma   . Hyperlipidemia   . Hypertension   . 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  Kidney stones   . PAF (paroxysmal atrial fibrillation) (HCC) 10/09/2014   Noted on pacer check with 88 mode switches and longest episode >1 hour.  Now on Apixiban for CHADS2VASC score of 5    PAST SURGICAL HISTORY: Past Surgical History:  Procedure Laterality Date  . PACEMAKER INSERTION  2005, 04/16/12   MDT implanted by Dr Amil Amen with generator chnage (MDT Adapta L) by Dr Johney Frame 04/16/12  . PERMANENT PACEMAKER GENERATOR CHANGE N/A 04/16/2012   Procedure: PERMANENT PACEMAKER GENERATOR CHANGE;  Surgeon: Hillis Range, MD;  Location: [facility] CATH LAB;  Service: Cardiovascular;  Laterality: N/A;    FAMILY HISTORY: Family History  Problem Relation Age of Onset    . Heart disease Father   . Heart attack Father     SOCIAL HISTORY: Social History   Socioeconomic History  . Marital status: Married    Spouse name: [name]  . Number of children: 2  . Years of education: BS  . Highest education level: Not on file  Occupational History  . Not on file  Tobacco Use  . Smoking status: Former Smoker    Types: Cigarettes    Quit date: 09/16/1998    Years since quitting: 21.0  . Smokeless tobacco: Never Used  Substance and Sexual Activity  . Alcohol use: Yes    Alcohol/week: 5.0 standard drinks    Types: 2 Glasses of wine, 3 Shots of liquor per week    Comment: drinks a tequila shot daily  . Drug use: No  . Sexual activity: Not on file  Other Topics Concern  . 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   Patient is married with 2 children.   Patient is right handed.   Patient has BS degree.   Patient drinks 1 cup daily.         Social Determinants of Health   Financial Resource Strain:   . Difficulty of Paying Living Expenses: Not on file  Food Insecurity:   . Worried About Programme researcher, broadcasting/film/video in the Last Year: Not on file  . Ran Out of Food in the Last Year: Not on file  Transportation Needs:   . Lack of Transportation (Medical): Not on file  . Lack of Transportation (Non-Medical): Not on file  Physical Activity:   . Days of Exercise per Week: Not on file  . Minutes of Exercise per Session: Not on file  Stress:   . Feeling of Stress : Not on file  Social Connections:   . Frequency of Communication with Friends and Family: Not on file  . Frequency of Social Gatherings with Friends and Family: Not on file  . Attends Religious Services: Not on file  . Active Member of Clubs or Organizations: Not on file  . Attends Banker Meetings: Not on file  . Marital Status: Not on file  Intimate Partner Violence:   . Fear of Current or Ex-Partner: Not on file  . Emotionally Abused:  Not on file  . Physically Abused: Not on file  . Sexually Abused: Not on file      PHYSICAL EXAM  Vitals:   09/28/19 0913  BP: 132/61  Pulse: 72  Temp: (!) 96.6 F (35.9 C)  Weight: 153 lb 12.8 oz (69.8 kg)  Height: 5\' 8"  (1.727 m)   Body mass index is 23.39 kg/m.   MMSE - Mini Mental State Exam 09/28/2019 02/17/2019 06/01/2018  Orientation to time 0 2  3  Orientation to Place 1 5 4   Registration 3 3 3   Attention/ Calculation 0 0 1  Attention/Calculation-comments - - -  Recall 0 1 0  Language- name 2 objects 2 2 2   Language- repeat 1 1 1   Language- follow 3 step command 3 3 2   Language- follow 3 step command-comments - - used left hand  Language- read & follow direction 1 1 1   Write a sentence 0 1 1  Copy design 1 1 0  Copy design-comments named 4 animals - -  Total score 12 20 18      Generalized: Well developed, in no acute distress   Neurological examination  Mentation: Alert oriented to time, place, history taking. Follows all commands speech and language fluent Cranial nerve II-XII: Pupils were equal round reactive to light. Extraocular movements were full, visual field were full on confrontational test.  Uvula tongue midline. Head turning and shoulder shrug  were normal and symmetric. Motor: The motor testing reveals 5 over 5 strength of all 4 extremities. Good symmetric motor tone is noted throughout.  Sensory: Sensory testing is intact to soft touch on all 4 extremities. No evidence of extinction is noted.  Coordination: Cerebellar testing reveals good finger-nose-finger and heel-to-shin bilaterally.  Gait and station: Gait is normal.   Reflexes: Deep tendon reflexes are symmetric and normal bilaterally.   DIAGNOSTIC DATA (LABS, IMAGING, TESTING) - I reviewed patient records, labs, notes, testing and imaging myself where available.  Lab Results  Component Value Date   WBC 9.2 09/02/2018   HGB 14.4 09/02/2018   HCT 44.9 09/02/2018   MCV 83.9 09/02/2018    PLT 271 09/02/2018      Component Value Date/Time   NA 141 09/02/2018 0155   NA 142 06/15/2018 0818   K 4.2 09/02/2018 0155   CL 108 09/02/2018 0155   CO2 22 09/02/2018 0155   GLUCOSE 106 (H) 09/02/2018 0155   BUN 20 09/02/2018 0155   BUN 20 06/15/2018 0818   CREATININE 1.39 (H) 09/02/2018 0155   CALCIUM 9.4 09/02/2018 0155   PROT 5.9 (L) 12/16/2016 0822   ALBUMIN 4.3 12/16/2016 0822   AST 21 12/16/2016 0822   ALT 16 12/16/2016 0822   ALKPHOS 61 12/16/2016 0822   BILITOT 0.3 12/16/2016 0822   GFRNONAA 46 (L) 09/02/2018 0155   GFRAA 54 (L) 09/02/2018 0155   Lab Results  Component Value Date   CHOL 128 12/16/2016   HDL 55 12/16/2016   LDLCALC 58 12/16/2016   TRIG 74 12/16/2016   CHOLHDL 2.3 12/16/2016     ASSESSMENT AND PLAN 86 y.o. year old male  has a past medical history of Allergic rhinitis, cause unspecified, BPH (benign prostatic hyperplasia), Carotid artery stenosis, asymptomatic, Cataracts, bilateral, Chronic kidney disease (CKD), stage III (moderate), Complete heart block ([place]) (01/23/04), COPD (chronic obstructive pulmonary disease) ([place]), Diabetes mellitus, Glaucoma, Hyperlipidemia, Hypertension, Insomnia due to medical condition (08/10/2014), Kidney stones, and PAF (paroxysmal atrial fibrillation) ([place]) (10/09/2014). here with:  1: Dementia  -Continue Aricept and Namenda -Memory score decreased MMSE 12 out of 30 -Encouraged the patient to participate in word puzzles and word searches -Advised if symptoms worsen or he develops new symptoms he should let us know.  -Follow-up in 6 months or sooner if needed   I spent 15 minutes with the patient. 50% of this time was spent reviewing plan of care   Ward Givens, MSN, NP-C 09/28/2019, 9:16 AM [facility] [address], [place], [place]  [address] [phone]   

## 2019-09-28 NOTE — Patient Instructions (Signed)
Your Plan:  Continue Aricept and Namenda Memory score 12/30  If your symptoms worsen or you develop new symptoms please let us know.   Thank you for coming to see [place] at [facility]. I hope we have been able to provide you high quality care today.  You may receive a patient satisfaction survey over the next few weeks. We would appreciate your feedback and comments so that we may continue to improve ourselves and the health of our patients.

## 2019-10-13 DIAGNOSIS — R0789 Other chest pain: Secondary | ICD-10-CM | POA: Diagnosis not present

## 2019-10-28 DIAGNOSIS — E7800 Pure hypercholesterolemia, unspecified: Secondary | ICD-10-CM | POA: Diagnosis not present

## 2019-10-28 DIAGNOSIS — F0390 Unspecified dementia without behavioral disturbance: Secondary | ICD-10-CM | POA: Diagnosis not present

## 2019-10-28 DIAGNOSIS — I10 Essential (primary) hypertension: Secondary | ICD-10-CM | POA: Diagnosis not present

## 2019-10-28 DIAGNOSIS — E1122 Type 2 diabetes mellitus with diabetic chronic kidney disease: Secondary | ICD-10-CM | POA: Diagnosis not present

## 2019-10-28 DIAGNOSIS — N183 Chronic kidney disease, stage 3 unspecified: Secondary | ICD-10-CM | POA: Diagnosis not present

## 2019-10-28 DIAGNOSIS — J439 Emphysema, unspecified: Secondary | ICD-10-CM | POA: Diagnosis not present

## 2019-10-28 DIAGNOSIS — E782 Mixed hyperlipidemia: Secondary | ICD-10-CM | POA: Diagnosis not present

## 2019-10-28 DIAGNOSIS — I480 Paroxysmal atrial fibrillation: Secondary | ICD-10-CM | POA: Diagnosis not present

## 2019-10-28 DIAGNOSIS — E039 Hypothyroidism, unspecified: Secondary | ICD-10-CM | POA: Diagnosis not present

## 2019-11-24 DIAGNOSIS — J439 Emphysema, unspecified: Secondary | ICD-10-CM | POA: Diagnosis not present

## 2019-11-24 DIAGNOSIS — I10 Essential (primary) hypertension: Secondary | ICD-10-CM | POA: Diagnosis not present

## 2019-11-24 DIAGNOSIS — E039 Hypothyroidism, unspecified: Secondary | ICD-10-CM | POA: Diagnosis not present

## 2019-11-24 DIAGNOSIS — N183 Chronic kidney disease, stage 3 unspecified: Secondary | ICD-10-CM | POA: Diagnosis not present

## 2019-11-24 DIAGNOSIS — E7800 Pure hypercholesterolemia, unspecified: Secondary | ICD-10-CM | POA: Diagnosis not present

## 2019-11-24 DIAGNOSIS — E1122 Type 2 diabetes mellitus with diabetic chronic kidney disease: Secondary | ICD-10-CM | POA: Diagnosis not present

## 2019-11-24 DIAGNOSIS — F0390 Unspecified dementia without behavioral disturbance: Secondary | ICD-10-CM | POA: Diagnosis not present

## 2019-11-24 DIAGNOSIS — I480 Paroxysmal atrial fibrillation: Secondary | ICD-10-CM | POA: Diagnosis not present

## 2019-11-24 DIAGNOSIS — E782 Mixed hyperlipidemia: Secondary | ICD-10-CM | POA: Diagnosis not present

## 2019-12-13 DIAGNOSIS — J309 Allergic rhinitis, unspecified: Secondary | ICD-10-CM | POA: Diagnosis not present

## 2019-12-13 DIAGNOSIS — I6521 Occlusion and stenosis of right carotid artery: Secondary | ICD-10-CM | POA: Diagnosis not present

## 2019-12-13 DIAGNOSIS — Z1389 Encounter for screening for other disorder: Secondary | ICD-10-CM | POA: Diagnosis not present

## 2019-12-13 DIAGNOSIS — N1831 Chronic kidney disease, stage 3a: Secondary | ICD-10-CM | POA: Diagnosis not present

## 2019-12-13 DIAGNOSIS — Z0000 Encounter for general adult medical examination without abnormal findings: Secondary | ICD-10-CM | POA: Diagnosis not present

## 2019-12-13 DIAGNOSIS — E1122 Type 2 diabetes mellitus with diabetic chronic kidney disease: Secondary | ICD-10-CM | POA: Diagnosis not present

## 2019-12-13 DIAGNOSIS — F0390 Unspecified dementia without behavioral disturbance: Secondary | ICD-10-CM | POA: Diagnosis not present

## 2019-12-13 DIAGNOSIS — E7800 Pure hypercholesterolemia, unspecified: Secondary | ICD-10-CM | POA: Diagnosis not present

## 2019-12-13 DIAGNOSIS — I480 Paroxysmal atrial fibrillation: Secondary | ICD-10-CM | POA: Diagnosis not present

## 2019-12-13 DIAGNOSIS — E039 Hypothyroidism, unspecified: Secondary | ICD-10-CM | POA: Diagnosis not present

## 2019-12-14 ENCOUNTER — Ambulatory Visit (INDEPENDENT_AMBULATORY_CARE_PROVIDER_SITE_OTHER): Payer: PPO | Admitting: *Deleted

## 2019-12-14 DIAGNOSIS — I442 Atrioventricular block, complete: Secondary | ICD-10-CM | POA: Diagnosis not present

## 2019-12-14 LAB — CUP PACEART REMOTE DEVICE CHECK
Battery Impedance: 1801 Ohm
Battery Remaining Longevity: 29 mo
Battery Voltage: 2.74 V
Brady Statistic AP VP Percent: 94 %
Brady Statistic AP VS Percent: 0 %
Brady Statistic AS VP Percent: 5 %
Brady Statistic AS VS Percent: 0 %
Date Time Interrogation Session: 20210330161618
Implantable Lead Implant Date: 20050509
Implantable Lead Implant Date: 20050509
Implantable Lead Location: 753859
Implantable Lead Location: 753860
Implantable Lead Model: 5076
Implantable Lead Model: 5092
Implantable Pulse Generator Implant Date: 20130801
Lead Channel Impedance Value: 399 Ohm
Lead Channel Impedance Value: 541 Ohm
Lead Channel Pacing Threshold Amplitude: 0.5 V
Lead Channel Pacing Threshold Amplitude: 1.125 V
Lead Channel Pacing Threshold Pulse Width: 0.4 ms
Lead Channel Pacing Threshold Pulse Width: 0.4 ms
Lead Channel Setting Pacing Amplitude: 2 V
Lead Channel Setting Pacing Amplitude: 2.75 V
Lead Channel Setting Pacing Pulse Width: 0.4 ms
Lead Channel Setting Sensing Sensitivity: 4 mV

## 2019-12-14 NOTE — Progress Notes (Signed)
PPM remote 

## 2019-12-31 ENCOUNTER — Telehealth: Payer: Self-pay | Admitting: Adult Health

## 2019-12-31 NOTE — Telephone Encounter (Signed)
Pt's wife [name] on [place] called wanting to speak to RN or provider about the pt's diagnosis. She wants to be advised on how to deal with the pts condition that seems to be getting worse rapidly. She states barley has any short term memory left.

## 2020-01-03 ENCOUNTER — Encounter: Payer: Self-pay | Admitting: Adult Health

## 2020-01-03 ENCOUNTER — Ambulatory Visit (INDEPENDENT_AMBULATORY_CARE_PROVIDER_SITE_OTHER): Payer: PPO | Admitting: Adult Health

## 2020-01-03 ENCOUNTER — Other Ambulatory Visit: Payer: Self-pay

## 2020-01-03 VITALS — BP 136/82 | HR 82 | Temp 97.6°F | Ht 68.0 in | Wt 153.0 lb

## 2020-01-03 DIAGNOSIS — F0390 Unspecified dementia without behavioral disturbance: Secondary | ICD-10-CM

## 2020-01-03 NOTE — Patient Instructions (Signed)
Your Plan:  Continue Aricept and Namenda Memory score is stable If your symptoms worsen or you develop new symptoms please let us know.    Thank you for coming to see us at [facility]. I hope we have been able to provide you high quality care today.  You may receive a patient satisfaction survey over the next few weeks. We would appreciate your feedback and comments so that we may continue to improve ourselves and the health of our patients.  

## 2020-01-03 NOTE — Progress Notes (Signed)
PATIENT: Edward Suarez DOB: 1932/08/31  REASON FOR VISIT: follow up HISTORY FROM: patient  HISTORY OF PRESENT ILLNESS: Today 01/03/20:  Edward Suarez is an 86 year old male with a history of memory disturbance.  He returns today for follow-up.  His wife feels that his memory has gotten slightly worse particularly his short-term memory.  She reports that he is able to complete all ADLs independently.  He continues to help with chores.  He does not operate a motor vehicle.  His wife manages his medications, appointments and all the finances.  She reports that approximately once a month he will wake up at 3 or 4 AM and turn all the lights on and watch television.  Otherwise no significant changes in his mood or behaviors.  HISTORY 09/28/19:  Edward Suarez is an 86 year old male with a history of memory disturbance.  He returns today for follow-up.  He is currently on Aricept and tolerating it well.  He is able to complete all ADLs independently.  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  His wife completes the finances.  He does help with household chores.  His wife has noticed a decline in his memory since the last visit.  The patient continues on Aricept and Namenda.  He reports he also has a sister that has dementia.  He returns today for evaluation.  REVIEW OF SYSTEMS: Out of a complete 14 system review of symptoms, the patient complains only of the following symptoms, and all other reviewed systems are negative.   See HPI  ALLERGIES: Allergies  Allergen Reactions  . Penicillins Rash    HOME MEDICATIONS: Outpatient Medications Prior to Visit  Medication Sig Dispense Refill  . amLODipine (NORVASC) 5 MG tablet TAKE 1 TABLET BY MOUTH EVERY DAY (Patient taking differently: Take 5 mg by mouth daily. ) 90 tablet 2  . atorvastatin (LIPITOR) 80 MG tablet TAKE 1 TABLET (80 MG TOTAL) BY MOUTH EVERY EVENING. 90 tablet 2  . Calcium Carb-Cholecalciferol (CALCIUM 1000 + D PO) Take by mouth. OTC    .  cetirizine (ZYRTEC) 10 MG tablet Take 10 mg by mouth daily. For allergies. Takes prn    . donepezil (ARICEPT) 10 MG tablet Take 1 tablet (10 mg total) by mouth at bedtime. 90 tablet 3  . fluticasone (FLONASE) 50 MCG/ACT nasal spray Place 2 sprays into both nostrils daily as needed for allergies or rhinitis.    [name] Kitchen glipiZIDE (GLUCOTROL) 5 MG tablet Take 5 mg by mouth daily before breakfast.      . levothyroxine (SYNTHROID, LEVOTHROID) 50 MCG tablet Take 50 mcg by mouth daily before breakfast.   3  . memantine (NAMENDA) 10 MG tablet Take 1 tablet (10 mg total) by mouth 2 (two) times daily. 180 tablet 3  . pioglitazone (ACTOS) 45 MG tablet Take 45 mg by mouth daily.     [name] Kitchen apixaban (ELIQUIS) 2.5 MG TABS tablet Take 1 tablet (2.5 mg total) by mouth 2 (two) times daily. 180 tablet 3   No facility-administered medications prior to visit.    PAST MEDICAL HISTORY: Past Medical History:  Diagnosis Date  . Allergic rhinitis, cause unspecified   . BPH (benign prostatic hyperplasia)   . Carotid artery stenosis, asymptomatic    1-39% by dopplers 09/2016 followed by Dr. Darrick Penna  . Cataracts, bilateral   . Chronic kidney disease (CKD), stage III (moderate)   . Complete heart block (HCC) 01/23/04   s/p Medtronic PPM implanted by Dr Amil Amen  . COPD (chronic  obstructive pulmonary disease) (HCC)   . Diabetes mellitus   . Glaucoma   . Hyperlipidemia   . Hypertension   . 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 Kidney stones   . PAF (paroxysmal atrial fibrillation) (HCC) 10/09/2014   Noted on pacer check with 88 mode switches and longest episode >1 hour.  Now on Apixiban for CHADS2VASC score of 5    PAST SURGICAL HISTORY: Past Surgical History:  Procedure Laterality Date  . PACEMAKER INSERTION  2005, 04/16/12   MDT implanted by Dr Amil Amen with generator chnage (MDT Adapta L) by Dr Johney Frame 04/16/12  . PERMANENT PACEMAKER GENERATOR CHANGE N/A 04/16/2012   Procedure: PERMANENT PACEMAKER GENERATOR CHANGE;   Surgeon: Hillis Range, MD;  Location: [facility] CATH LAB;  Service: Cardiovascular;  Laterality: N/A;    FAMILY HISTORY: Family History  Problem Relation Age of Onset  . Heart disease Father   . Heart attack Father     SOCIAL HISTORY: Social History   Socioeconomic History  . Marital status: Married    Spouse name: [name]  . Number of children: 2  . Years of education: BS  . Highest education level: Not on file  Occupational History  . Not on file  Tobacco Use  . Smoking status: Former Smoker    Types: Cigarettes    Quit date: 09/16/1998    Years since quitting: 21.3  . Smokeless tobacco: Never Used  Substance and Sexual Activity  . Alcohol use: Yes    Alcohol/week: 5.0 standard drinks    Types: 2 Glasses of wine, 3 Shots of liquor per week    Comment: drinks a tequila shot daily  . Drug use: No  . Sexual activity: Not on file  Other Topics Concern  . 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   Patient is married with 2 children.   Patient is right handed.   Patient has BS degree.   Patient drinks 1 cup daily.         Social Determinants of Health   Financial Resource Strain:   . Difficulty of Paying Living Expenses:   Food Insecurity:   . Worried About Programme researcher, broadcasting/film/video in the Last Year:   . Barista in the Last Year:   Transportation Needs:   . Freight forwarder (Medical):   [name] Kitchen Lack of Transportation (Non-Medical):   Physical Activity:   . Days of Exercise per Week:   . Minutes of Exercise per Session:   Stress:   . Feeling of Stress :   Social Connections:   . Frequency of Communication with Friends and Family:   . Frequency of Social Gatherings with Friends and Family:   . Attends Religious Services:   . Active Member of Clubs or Organizations:   . Attends Banker Meetings:   [name] Kitchen Marital Status:   Intimate Partner Violence:   . Fear of Current or Ex-Partner:   . Emotionally Abused:    [name] Kitchen Physically Abused:   . Sexually Abused:       PHYSICAL EXAM  Vitals:   01/03/20 1407  BP: 136/82  Pulse: 82  Temp: 97.6 F (36.4 C)  Weight: 153 lb (69.4 kg)  Height: 5\' 8"  (1.727 m)   Body mass index is 23.26 kg/m.   MMSE - Mini Mental State Exam 01/03/2020 09/28/2019 02/17/2019  Orientation to time 2 0 2  Orientation to Place 2 1 5  Registration 3 3 3   Attention/ Calculation 4 0 0  Attention/Calculation-comments - - -  Recall 0 0 1  Language- name 2 objects 2 2 2   Language- repeat 0 1 1  Language- follow 3 step command 2 3 3   Language- follow 3 step command-comments - - -  Language- read & follow direction 1 1 1   Write a sentence 1 0 1  Copy design 0 1 1  Copy design-comments 3 animals named 4 animals -  Total score 17 12 20      Generalized: Well developed, in no acute distress   Neurological examination  Mentation: Alert. history taking. Follows all commands speech and language fluent Cranial nerve II-XII: Pupils were equal round reactive to light. Extraocular movements were full, visual field were full on confrontational test.  Head turning and shoulder shrug  were normal and symmetric. Motor: The motor testing reveals 5 over 5 strength of all 4 extremities. Good symmetric motor tone is noted throughout.  Sensory: Sensory testing is intact to soft touch on all 4 extremities. No evidence of extinction is noted.  Coordination: Cerebellar testing reveals good finger-nose-finger and heel-to-shin bilaterally.  Gait and station: Gait is normal.   Reflexes: Deep tendon reflexes are symmetric and normal bilaterally.   DIAGNOSTIC DATA (LABS, IMAGING, TESTING) - I reviewed patient records, labs, notes, testing and imaging myself where available.  Lab Results  Component Value Date   WBC 9.2 09/02/2018   HGB 14.4 09/02/2018   HCT 44.9 09/02/2018   MCV 83.9 09/02/2018   PLT 271 09/02/2018      Component Value Date/Time   NA 141 09/02/2018 0155   NA 142 06/15/2018  0818   K 4.2 09/02/2018 0155   CL 108 09/02/2018 0155   CO2 22 09/02/2018 0155   GLUCOSE 106 (H) 09/02/2018 0155   BUN 20 09/02/2018 0155   BUN 20 06/15/2018 0818   CREATININE 1.39 (H) 09/02/2018 0155   CALCIUM 9.4 09/02/2018 0155   PROT 5.9 (L) 12/16/2016 0822   ALBUMIN 4.3 12/16/2016 0822   AST 21 12/16/2016 0822   ALT 16 12/16/2016 0822   ALKPHOS 61 12/16/2016 0822   BILITOT 0.3 12/16/2016 0822   GFRNONAA 46 (L) 09/02/2018 0155   GFRAA 54 (L) 09/02/2018 0155   Lab Results  Component Value Date   CHOL 128 12/16/2016   HDL 55 12/16/2016   LDLCALC 58 12/16/2016   TRIG 74 12/16/2016   CHOLHDL 2.3 12/16/2016   No results found for: HGBA1C No results found for: VITAMINB12 No results found for: TSH    ASSESSMENT AND PLAN 86 y.o. year old male  has a past medical history of Allergic rhinitis, cause unspecified, BPH (benign prostatic hyperplasia), Carotid artery stenosis, asymptomatic, Cataracts, bilateral, Chronic kidney disease (CKD), stage III (moderate), Complete heart block ([place]) (01/23/04), COPD (chronic obstructive pulmonary disease) ([place]), Diabetes mellitus, Glaucoma, Hyperlipidemia, Hypertension, Insomnia due to medical condition (08/10/2014), Kidney stones, and PAF (paroxysmal atrial fibrillation) ([place]) (10/09/2014). here with :  1.  Dementia  -Continue Aricept and Namenda -MMSE 17/30 previously 12/30 -Advised if symptoms worsen or he develops any new symptoms he should let us know. -Follow-up in 6 months or sooner if needed   I spent 20 minutes of face-to-face and non-face-to-face time with patient.  This included previsit chart review, lab review, study review, order entry, electronic health record documentation, patient education.  Ward Givens, MSN, NP-C 01/03/2020, 2:13 PM [facility] [address] [place], [place] [address] [phone]

## 2020-01-03 NOTE — Telephone Encounter (Signed)
I called wife.  Wife concerned, her first "rodeo" with dementia.  Pt worsening.  Having aberrant behaviors (waking at 0300).  She wanted to come on her own, but I instructed to bring pt in as well, last MMSE 12/30.  Will see at 1400.

## 2020-01-14 DIAGNOSIS — J439 Emphysema, unspecified: Secondary | ICD-10-CM | POA: Diagnosis not present

## 2020-01-14 DIAGNOSIS — E1122 Type 2 diabetes mellitus with diabetic chronic kidney disease: Secondary | ICD-10-CM | POA: Diagnosis not present

## 2020-01-14 DIAGNOSIS — E039 Hypothyroidism, unspecified: Secondary | ICD-10-CM | POA: Diagnosis not present

## 2020-01-14 DIAGNOSIS — F0390 Unspecified dementia without behavioral disturbance: Secondary | ICD-10-CM | POA: Diagnosis not present

## 2020-01-14 DIAGNOSIS — E7800 Pure hypercholesterolemia, unspecified: Secondary | ICD-10-CM | POA: Diagnosis not present

## 2020-01-14 DIAGNOSIS — I10 Essential (primary) hypertension: Secondary | ICD-10-CM | POA: Diagnosis not present

## 2020-01-14 DIAGNOSIS — I480 Paroxysmal atrial fibrillation: Secondary | ICD-10-CM | POA: Diagnosis not present

## 2020-01-14 DIAGNOSIS — E782 Mixed hyperlipidemia: Secondary | ICD-10-CM | POA: Diagnosis not present

## 2020-01-14 DIAGNOSIS — N183 Chronic kidney disease, stage 3 unspecified: Secondary | ICD-10-CM | POA: Diagnosis not present

## 2020-02-01 DIAGNOSIS — H52203 Unspecified astigmatism, bilateral: Secondary | ICD-10-CM | POA: Diagnosis not present

## 2020-02-01 DIAGNOSIS — H524 Presbyopia: Secondary | ICD-10-CM | POA: Diagnosis not present

## 2020-02-01 DIAGNOSIS — Z961 Presence of intraocular lens: Secondary | ICD-10-CM | POA: Diagnosis not present

## 2020-02-01 DIAGNOSIS — E119 Type 2 diabetes mellitus without complications: Secondary | ICD-10-CM | POA: Diagnosis not present

## 2020-02-03 DIAGNOSIS — E1122 Type 2 diabetes mellitus with diabetic chronic kidney disease: Secondary | ICD-10-CM | POA: Diagnosis not present

## 2020-02-03 DIAGNOSIS — J439 Emphysema, unspecified: Secondary | ICD-10-CM | POA: Diagnosis not present

## 2020-02-03 DIAGNOSIS — I10 Essential (primary) hypertension: Secondary | ICD-10-CM | POA: Diagnosis not present

## 2020-02-03 DIAGNOSIS — E7800 Pure hypercholesterolemia, unspecified: Secondary | ICD-10-CM | POA: Diagnosis not present

## 2020-02-03 DIAGNOSIS — I480 Paroxysmal atrial fibrillation: Secondary | ICD-10-CM | POA: Diagnosis not present

## 2020-02-03 DIAGNOSIS — F0390 Unspecified dementia without behavioral disturbance: Secondary | ICD-10-CM | POA: Diagnosis not present

## 2020-02-03 DIAGNOSIS — E782 Mixed hyperlipidemia: Secondary | ICD-10-CM | POA: Diagnosis not present

## 2020-02-03 DIAGNOSIS — E039 Hypothyroidism, unspecified: Secondary | ICD-10-CM | POA: Diagnosis not present

## 2020-02-03 DIAGNOSIS — N183 Chronic kidney disease, stage 3 unspecified: Secondary | ICD-10-CM | POA: Diagnosis not present

## 2020-02-23 ENCOUNTER — Other Ambulatory Visit: Payer: Self-pay | Admitting: Adult Health

## 2020-03-01 DIAGNOSIS — E782 Mixed hyperlipidemia: Secondary | ICD-10-CM | POA: Diagnosis not present

## 2020-03-01 DIAGNOSIS — E7800 Pure hypercholesterolemia, unspecified: Secondary | ICD-10-CM | POA: Diagnosis not present

## 2020-03-01 DIAGNOSIS — I10 Essential (primary) hypertension: Secondary | ICD-10-CM | POA: Diagnosis not present

## 2020-03-01 DIAGNOSIS — E1122 Type 2 diabetes mellitus with diabetic chronic kidney disease: Secondary | ICD-10-CM | POA: Diagnosis not present

## 2020-03-01 DIAGNOSIS — N183 Chronic kidney disease, stage 3 unspecified: Secondary | ICD-10-CM | POA: Diagnosis not present

## 2020-03-01 DIAGNOSIS — E039 Hypothyroidism, unspecified: Secondary | ICD-10-CM | POA: Diagnosis not present

## 2020-03-01 DIAGNOSIS — I480 Paroxysmal atrial fibrillation: Secondary | ICD-10-CM | POA: Diagnosis not present

## 2020-03-01 DIAGNOSIS — F0390 Unspecified dementia without behavioral disturbance: Secondary | ICD-10-CM | POA: Diagnosis not present

## 2020-03-01 DIAGNOSIS — J439 Emphysema, unspecified: Secondary | ICD-10-CM | POA: Diagnosis not present

## 2020-03-14 ENCOUNTER — Ambulatory Visit (INDEPENDENT_AMBULATORY_CARE_PROVIDER_SITE_OTHER): Payer: PPO | Admitting: *Deleted

## 2020-03-14 DIAGNOSIS — I495 Sick sinus syndrome: Secondary | ICD-10-CM

## 2020-03-15 LAB — CUP PACEART REMOTE DEVICE CHECK
Battery Impedance: 1916 Ohm
Battery Remaining Longevity: 25 mo
Battery Voltage: 2.75 V
Brady Statistic AP VP Percent: 95 %
Brady Statistic AP VS Percent: 0 %
Brady Statistic AS VP Percent: 5 %
Brady Statistic AS VS Percent: 0 %
Date Time Interrogation Session: 20210629150413
Implantable Lead Implant Date: 20050509
Implantable Lead Implant Date: 20050509
Implantable Lead Location: 753859
Implantable Lead Location: 753860
Implantable Lead Model: 5076
Implantable Lead Model: 5092
Implantable Pulse Generator Implant Date: 20130801
Lead Channel Impedance Value: 405 Ohm
Lead Channel Impedance Value: 543 Ohm
Lead Channel Pacing Threshold Amplitude: 0.625 V
Lead Channel Pacing Threshold Amplitude: 1.125 V
Lead Channel Pacing Threshold Pulse Width: 0.4 ms
Lead Channel Pacing Threshold Pulse Width: 0.4 ms
Lead Channel Setting Pacing Amplitude: 2 V
Lead Channel Setting Pacing Amplitude: 3 V
Lead Channel Setting Pacing Pulse Width: 0.46 ms
Lead Channel Setting Sensing Sensitivity: 4 mV

## 2020-03-16 NOTE — Progress Notes (Signed)
Remote pacemaker transmission.   

## 2020-03-28 ENCOUNTER — Ambulatory Visit: Payer: PPO | Admitting: Adult Health

## 2020-04-07 DIAGNOSIS — F0390 Unspecified dementia without behavioral disturbance: Secondary | ICD-10-CM | POA: Diagnosis not present

## 2020-04-07 DIAGNOSIS — E039 Hypothyroidism, unspecified: Secondary | ICD-10-CM | POA: Diagnosis not present

## 2020-04-07 DIAGNOSIS — J439 Emphysema, unspecified: Secondary | ICD-10-CM | POA: Diagnosis not present

## 2020-04-07 DIAGNOSIS — E782 Mixed hyperlipidemia: Secondary | ICD-10-CM | POA: Diagnosis not present

## 2020-04-07 DIAGNOSIS — N183 Chronic kidney disease, stage 3 unspecified: Secondary | ICD-10-CM | POA: Diagnosis not present

## 2020-04-07 DIAGNOSIS — I10 Essential (primary) hypertension: Secondary | ICD-10-CM | POA: Diagnosis not present

## 2020-04-07 DIAGNOSIS — E1122 Type 2 diabetes mellitus with diabetic chronic kidney disease: Secondary | ICD-10-CM | POA: Diagnosis not present

## 2020-04-07 DIAGNOSIS — I480 Paroxysmal atrial fibrillation: Secondary | ICD-10-CM | POA: Diagnosis not present

## 2020-04-07 DIAGNOSIS — E7800 Pure hypercholesterolemia, unspecified: Secondary | ICD-10-CM | POA: Diagnosis not present

## 2020-04-26 DIAGNOSIS — H10413 Chronic giant papillary conjunctivitis, bilateral: Secondary | ICD-10-CM | POA: Diagnosis not present

## 2020-05-03 DIAGNOSIS — E7800 Pure hypercholesterolemia, unspecified: Secondary | ICD-10-CM | POA: Diagnosis not present

## 2020-05-03 DIAGNOSIS — E039 Hypothyroidism, unspecified: Secondary | ICD-10-CM | POA: Diagnosis not present

## 2020-05-03 DIAGNOSIS — E1122 Type 2 diabetes mellitus with diabetic chronic kidney disease: Secondary | ICD-10-CM | POA: Diagnosis not present

## 2020-05-03 DIAGNOSIS — J439 Emphysema, unspecified: Secondary | ICD-10-CM | POA: Diagnosis not present

## 2020-05-03 DIAGNOSIS — F0390 Unspecified dementia without behavioral disturbance: Secondary | ICD-10-CM | POA: Diagnosis not present

## 2020-05-03 DIAGNOSIS — N183 Chronic kidney disease, stage 3 unspecified: Secondary | ICD-10-CM | POA: Diagnosis not present

## 2020-05-03 DIAGNOSIS — I10 Essential (primary) hypertension: Secondary | ICD-10-CM | POA: Diagnosis not present

## 2020-05-03 DIAGNOSIS — E782 Mixed hyperlipidemia: Secondary | ICD-10-CM | POA: Diagnosis not present

## 2020-05-03 DIAGNOSIS — I480 Paroxysmal atrial fibrillation: Secondary | ICD-10-CM | POA: Diagnosis not present

## 2020-05-29 DIAGNOSIS — F0390 Unspecified dementia without behavioral disturbance: Secondary | ICD-10-CM | POA: Diagnosis not present

## 2020-05-29 DIAGNOSIS — I480 Paroxysmal atrial fibrillation: Secondary | ICD-10-CM | POA: Diagnosis not present

## 2020-05-29 DIAGNOSIS — E782 Mixed hyperlipidemia: Secondary | ICD-10-CM | POA: Diagnosis not present

## 2020-05-29 DIAGNOSIS — N183 Chronic kidney disease, stage 3 unspecified: Secondary | ICD-10-CM | POA: Diagnosis not present

## 2020-05-29 DIAGNOSIS — J439 Emphysema, unspecified: Secondary | ICD-10-CM | POA: Diagnosis not present

## 2020-05-29 DIAGNOSIS — E7800 Pure hypercholesterolemia, unspecified: Secondary | ICD-10-CM | POA: Diagnosis not present

## 2020-05-29 DIAGNOSIS — N1832 Chronic kidney disease, stage 3b: Secondary | ICD-10-CM | POA: Diagnosis not present

## 2020-05-29 DIAGNOSIS — I10 Essential (primary) hypertension: Secondary | ICD-10-CM | POA: Diagnosis not present

## 2020-05-29 DIAGNOSIS — E039 Hypothyroidism, unspecified: Secondary | ICD-10-CM | POA: Diagnosis not present

## 2020-05-29 DIAGNOSIS — E1122 Type 2 diabetes mellitus with diabetic chronic kidney disease: Secondary | ICD-10-CM | POA: Diagnosis not present

## 2020-06-13 ENCOUNTER — Ambulatory Visit (INDEPENDENT_AMBULATORY_CARE_PROVIDER_SITE_OTHER): Payer: PPO | Admitting: Emergency Medicine

## 2020-06-13 DIAGNOSIS — I495 Sick sinus syndrome: Secondary | ICD-10-CM

## 2020-06-13 DIAGNOSIS — I10 Essential (primary) hypertension: Secondary | ICD-10-CM | POA: Diagnosis not present

## 2020-06-13 DIAGNOSIS — I480 Paroxysmal atrial fibrillation: Secondary | ICD-10-CM | POA: Diagnosis not present

## 2020-06-13 DIAGNOSIS — N1832 Chronic kidney disease, stage 3b: Secondary | ICD-10-CM | POA: Diagnosis not present

## 2020-06-13 DIAGNOSIS — E039 Hypothyroidism, unspecified: Secondary | ICD-10-CM | POA: Diagnosis not present

## 2020-06-13 DIAGNOSIS — Z23 Encounter for immunization: Secondary | ICD-10-CM | POA: Diagnosis not present

## 2020-06-13 DIAGNOSIS — E7800 Pure hypercholesterolemia, unspecified: Secondary | ICD-10-CM | POA: Diagnosis not present

## 2020-06-13 DIAGNOSIS — F0390 Unspecified dementia without behavioral disturbance: Secondary | ICD-10-CM | POA: Diagnosis not present

## 2020-06-13 DIAGNOSIS — Z7984 Long term (current) use of oral hypoglycemic drugs: Secondary | ICD-10-CM | POA: Diagnosis not present

## 2020-06-13 DIAGNOSIS — E1122 Type 2 diabetes mellitus with diabetic chronic kidney disease: Secondary | ICD-10-CM | POA: Diagnosis not present

## 2020-06-13 LAB — CUP PACEART REMOTE DEVICE CHECK
Battery Impedance: 2033 Ohm
Battery Remaining Longevity: 24 mo
Battery Voltage: 2.74 V
Brady Statistic AP VP Percent: 95 %
Brady Statistic AP VS Percent: 0 %
Brady Statistic AS VP Percent: 5 %
Brady Statistic AS VS Percent: 0 %
Date Time Interrogation Session: 20210927155148
Implantable Lead Implant Date: 20050509
Implantable Lead Implant Date: 20050509
Implantable Lead Location: 753859
Implantable Lead Location: 753860
Implantable Lead Model: 5076
Implantable Lead Model: 5092
Implantable Pulse Generator Implant Date: 20130801
Lead Channel Impedance Value: 416 Ohm
Lead Channel Impedance Value: 566 Ohm
Lead Channel Pacing Threshold Amplitude: 0.625 V
Lead Channel Pacing Threshold Amplitude: 1.25 V
Lead Channel Pacing Threshold Pulse Width: 0.4 ms
Lead Channel Pacing Threshold Pulse Width: 0.4 ms
Lead Channel Setting Pacing Amplitude: 2 V
Lead Channel Setting Pacing Amplitude: 3.25 V
Lead Channel Setting Pacing Pulse Width: 0.4 ms
Lead Channel Setting Sensing Sensitivity: 4 mV

## 2020-06-16 NOTE — Progress Notes (Signed)
Remote pacemaker transmission.   

## 2020-07-09 DIAGNOSIS — B349 Viral infection, unspecified: Secondary | ICD-10-CM | POA: Diagnosis not present

## 2020-07-09 DIAGNOSIS — R059 Cough, unspecified: Secondary | ICD-10-CM | POA: Diagnosis not present

## 2020-07-09 DIAGNOSIS — Z03818 Encounter for observation for suspected exposure to other biological agents ruled out: Secondary | ICD-10-CM | POA: Diagnosis not present

## 2020-07-09 DIAGNOSIS — R0981 Nasal congestion: Secondary | ICD-10-CM | POA: Diagnosis not present

## 2020-07-11 ENCOUNTER — Other Ambulatory Visit: Payer: Self-pay

## 2020-07-11 ENCOUNTER — Ambulatory Visit: Payer: PPO | Admitting: Adult Health

## 2020-07-11 ENCOUNTER — Encounter: Payer: Self-pay | Admitting: Adult Health

## 2020-07-11 VITALS — BP 147/69 | HR 78 | Ht 68.0 in | Wt 155.4 lb

## 2020-07-11 DIAGNOSIS — F0390 Unspecified dementia without behavioral disturbance: Secondary | ICD-10-CM

## 2020-07-11 NOTE — Patient Instructions (Signed)
Your Plan:  Continue namenda and aricept  MMSE 8/30 If your symptoms worsen or you develop new symptoms please let us know.   Thank you for coming to see [place] at [facility]. I hope we have been able to provide you high quality care today.  You may receive a patient satisfaction survey over the next few weeks. We would appreciate your feedback and comments so that we may continue to improve ourselves and the health of our patients.

## 2020-07-11 NOTE — Progress Notes (Signed)
PATIENT: Edward Suarez DOB: [birth year]  REASON FOR VISIT: follow up HISTORY FROM: patient  HISTORY OF PRESENT ILLNESS: Today 07/11/20:  Edward Suarez is an 86 year old male with a history of memory disturbance. She returns today for follow-up. Currently on Aricept and Namenda. Wife has noticed some progression. She manages his meds/appointments and fiances. He is able to complete all ADLS. Not driving. Sleeps well. Pleasant. No new issues.   HISTORY 01/03/20:  Edward Suarez is an 86 year old male with a history of memory disturbance.  He returns today for follow-up.  His wife feels that his memory has gotten slightly worse particularly his short-term memory.  She reports that he is able to complete all ADLs independently.  He continues to help with chores.  He does not operate a motor vehicle.  His wife manages his medications, appointments and all the finances.  She reports that approximately once a month he will wake up at 3 or 4 AM and turn all the lights on and watch television.  Otherwise no significant changes in his mood or behaviors.  REVIEW OF SYSTEMS: Out of a complete 14 system review of symptoms, the patient complains only of the following symptoms, and all other reviewed systems are negative.  See HPI  ALLERGIES: Allergies  Allergen Reactions  . Penicillins Rash    HOME MEDICATIONS: Outpatient Medications Prior to Visit  Medication Sig Dispense Refill  . amLODipine (NORVASC) 5 MG tablet TAKE 1 TABLET BY MOUTH EVERY DAY (Patient taking differently: Take 5 mg by mouth daily. ) 90 tablet 2  . atorvastatin (LIPITOR) 80 MG tablet TAKE 1 TABLET (80 MG TOTAL) BY MOUTH EVERY EVENING. 90 tablet 2  . Calcium Carb-Cholecalciferol (CALCIUM 1000 + D PO) Take by mouth. OTC    . cetirizine (ZYRTEC) 10 MG tablet Take 10 mg by mouth daily. For allergies. Takes prn    . donepezil (ARICEPT) 10 MG tablet TAKE ONE TABLET BY MOUTH EVERY EVENING 90 tablet 3  . fluticasone (FLONASE) 50 MCG/ACT  nasal spray Place 2 sprays into both nostrils daily as needed for allergies or rhinitis.    [name] Kitchen glipiZIDE (GLUCOTROL) 5 MG tablet Take 5 mg by mouth daily before breakfast.      . levothyroxine (SYNTHROID, LEVOTHROID) 50 MCG tablet Take 50 mcg by mouth daily before breakfast.   3  . memantine (NAMENDA) 10 MG tablet Take 1 tablet (10 mg total) by mouth 2 (two) times daily. 180 tablet 3  . pioglitazone (ACTOS) 45 MG tablet Take 45 mg by mouth daily.     [name] Kitchen apixaban (ELIQUIS) 2.5 MG TABS tablet Take 1 tablet (2.5 mg total) by mouth 2 (two) times daily. 180 tablet 3   No facility-administered medications prior to visit.    PAST MEDICAL HISTORY: Past Medical History:  Diagnosis Date  . Allergic rhinitis, cause unspecified   . BPH (benign prostatic hyperplasia)   . Carotid artery stenosis, asymptomatic    1-39% by dopplers 09/2016 followed by Dr. Darrick Penna  . Cataracts, bilateral   . Chronic kidney disease (CKD), stage III (moderate) (HCC)   . Complete heart block (HCC) 01/23/04   s/p Medtronic PPM implanted by Dr Amil Amen  . COPD (chronic obstructive pulmonary disease) (HCC)   . Diabetes mellitus   . Glaucoma   . Hyperlipidemia   . Hypertension   . 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 Kidney stones   . PAF (paroxysmal atrial fibrillation) (HCC) 10/09/2014   Noted on pacer check  with 88 mode switches and longest episode >1 hour.  Now on Apixiban for CHADS2VASC score of 5    PAST SURGICAL HISTORY: Past Surgical History:  Procedure Laterality Date  . PACEMAKER INSERTION  2005, 04/16/12   MDT implanted by Dr Amil Amen with generator chnage (MDT Adapta L) by Dr Johney Frame 04/16/12  . PERMANENT PACEMAKER GENERATOR CHANGE N/A 04/16/2012   Procedure: PERMANENT PACEMAKER GENERATOR CHANGE;  Surgeon: Hillis Range, MD;  Location: [facility] CATH [facility];  Service: Cardiovascular;  Laterality: N/A;    FAMILY HISTORY: Family History  Problem Relation Age of Onset  . Heart disease Father   . Heart attack Father      SOCIAL HISTORY: Social History   Socioeconomic History  . Marital status: Married    Spouse name: [name]  . Number of children: 2  . Years of education: BS  . Highest education level: Not on file  Occupational History  . Not on file  Tobacco Use  . Smoking status: Former Smoker    Types: Cigarettes    Quit date: 09/16/1998    Years since quitting: 21.8  . Smokeless tobacco: Never Used  Vaping Use  . Vaping Use: Never used  Substance and Sexual Activity  . Alcohol use: Yes    Alcohol/week: 5.0 standard drinks    Types: 2 Glasses of wine, 3 Shots of liquor per week    Comment: drinks a tequila shot daily  . Drug use: No  . Sexual activity: Not on file  Other Topics Concern  . 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   Patient is married with 2 children.   Patient is right handed.   Patient has BS degree.   Patient drinks 1 cup daily.         Social Determinants of Health   Financial Resource Strain:   . Difficulty of Paying Living Expenses: Not on file  Food Insecurity:   . Worried About Programme researcher, broadcasting/film/video in the Last Year: Not on file  . Ran Out of Food in the Last Year: Not on file  Transportation Needs:   . Lack of Transportation (Medical): Not on file  . Lack of Transportation (Non-Medical): Not on file  Physical Activity:   . Days of Exercise per Week: Not on file  . Minutes of Exercise per Session: Not on file  Stress:   . Feeling of Stress : Not on file  Social Connections:   . Frequency of Communication with Friends and Family: Not on file  . Frequency of Social Gatherings with Friends and Family: Not on file  . Attends Religious Services: Not on file  . Active Member of Clubs or Organizations: Not on file  . Attends Banker Meetings: Not on file  . Marital Status: Not on file  Intimate Partner Violence:   . Fear of Current or Ex-Partner: Not on file  . Emotionally Abused: Not on file   . Physically Abused: Not on file  . Sexually Abused: Not on file      PHYSICAL EXAM  Vitals:   07/11/20 1036  Weight: 155 lb 6.4 oz (70.5 kg)  Height: 5\' 8"  (1.727 m)   Body mass index is 23.63 kg/m.   MMSE - Mini Mental State Exam 07/11/2020 07/11/2020 01/03/2020  Not completed: - Unable to complete -  Orientation to time 0 1 2  Orientation to Place 1 1 2   Registration 3 - 3  Attention/ Calculation 0 - 4  Attention/Calculation-comments - - -  Recall 0 - 0  Language- name 2 objects 2 - 2  Language- repeat 0 - 0  Language- follow 3 step command 1 - 2  Language- follow 3 step command-comments - - -  Language- read & follow direction 1 - 1  Write a sentence 0 - 1  Copy design 0 - 0  Copy design-comments - - 3 animals  Total score 8 - 17     Generalized: Well developed, in no acute distress   Neurological examination  Mentation: Alert oriented to time, place, history taking. Follows all commands speech and language fluent Cranial nerve II-XII: Pupils were equal round reactive to light. Extraocular movements were full, visual field were full on confrontational test.  Head turning and shoulder shrug  were normal and symmetric. Motor: The motor testing reveals 5 over 5 strength of all 4 extremities. Good symmetric motor tone is noted throughout.  Sensory: Sensory testing is intact to soft touch on all 4 extremities. No evidence of extinction is noted.  Coordination: Cerebellar testing reveals good finger-nose-finger and heel-to-shin bilaterally.  Gait and station: Gait is normal. Reflexes: Deep tendon reflexes are symmetric and normal bilaterally.   DIAGNOSTIC DATA (LABS, IMAGING, TESTING) - I reviewed patient records, labs, notes, testing and imaging myself where available.  Lab Results  Component Value Date   WBC 9.2 09/02/2018   HGB 14.4 09/02/2018   HCT 44.9 09/02/2018   MCV 83.9 09/02/2018   PLT 271 09/02/2018      Component Value Date/Time   NA 141  09/02/2018 0155   NA 142 06/15/2018 0818   K 4.2 09/02/2018 0155   CL 108 09/02/2018 0155   CO2 22 09/02/2018 0155   GLUCOSE 106 (H) 09/02/2018 0155   BUN 20 09/02/2018 0155   BUN 20 06/15/2018 0818   CREATININE 1.39 (H) 09/02/2018 0155   CALCIUM 9.4 09/02/2018 0155   PROT 5.9 (L) 12/16/2016 0822   ALBUMIN 4.3 12/16/2016 0822   AST 21 12/16/2016 0822   ALT 16 12/16/2016 0822   ALKPHOS 61 12/16/2016 0822   BILITOT 0.3 12/16/2016 0822   GFRNONAA 46 (L) 09/02/2018 0155   GFRAA 54 (L) 09/02/2018 0155   Lab Results  Component Value Date   CHOL 128 12/16/2016   HDL 55 12/16/2016   LDLCALC 58 12/16/2016   TRIG 74 12/16/2016   CHOLHDL 2.3 12/16/2016   No results found for: HGBA1C No results found for: VITAMINB12 No results found for: TSH    ASSESSMENT AND PLAN 86 y.o. year old male  has a past medical history of Allergic rhinitis, cause unspecified, BPH (benign prostatic hyperplasia), Carotid artery stenosis, asymptomatic, Cataracts, bilateral, Chronic kidney disease (CKD), stage III (moderate) (HCC), Complete heart block (HCC) (01/23/04), COPD (chronic obstructive pulmonary disease) (HCC), Diabetes mellitus, Glaucoma, Hyperlipidemia, Hypertension, Insomnia due to medical condition (08/10/2014), Kidney stones, and PAF (paroxysmal atrial fibrillation) (HCC) (10/09/2014). here with:  1. Dementia  - Continue Aricept and Namenda - MMSE decreased 8/30 - Maintain safe enviroment for the patient - Advised if symptoms worsen or develop new symptoms then let us kow -FU in 6 months   I spent 30 minutes of face-to-face and non-face-to-face time with patient.  This included previsit chart review, lab review, study review, order entry, electronic health record documentation, patient education.  Butch Penny, MSN, NP-C 07/11/2020, 10:38 AM [facility] [address] [place], [place] [address] [phone]

## 2020-07-14 DIAGNOSIS — J439 Emphysema, unspecified: Secondary | ICD-10-CM | POA: Diagnosis not present

## 2020-07-14 DIAGNOSIS — I480 Paroxysmal atrial fibrillation: Secondary | ICD-10-CM | POA: Diagnosis not present

## 2020-07-14 DIAGNOSIS — N183 Chronic kidney disease, stage 3 unspecified: Secondary | ICD-10-CM | POA: Diagnosis not present

## 2020-07-14 DIAGNOSIS — F0390 Unspecified dementia without behavioral disturbance: Secondary | ICD-10-CM | POA: Diagnosis not present

## 2020-07-14 DIAGNOSIS — E782 Mixed hyperlipidemia: Secondary | ICD-10-CM | POA: Diagnosis not present

## 2020-07-14 DIAGNOSIS — N1832 Chronic kidney disease, stage 3b: Secondary | ICD-10-CM | POA: Diagnosis not present

## 2020-07-14 DIAGNOSIS — I10 Essential (primary) hypertension: Secondary | ICD-10-CM | POA: Diagnosis not present

## 2020-07-14 DIAGNOSIS — E039 Hypothyroidism, unspecified: Secondary | ICD-10-CM | POA: Diagnosis not present

## 2020-07-14 DIAGNOSIS — E1122 Type 2 diabetes mellitus with diabetic chronic kidney disease: Secondary | ICD-10-CM | POA: Diagnosis not present

## 2020-07-14 DIAGNOSIS — E7800 Pure hypercholesterolemia, unspecified: Secondary | ICD-10-CM | POA: Diagnosis not present

## 2020-07-31 DIAGNOSIS — J439 Emphysema, unspecified: Secondary | ICD-10-CM | POA: Diagnosis not present

## 2020-07-31 DIAGNOSIS — N1832 Chronic kidney disease, stage 3b: Secondary | ICD-10-CM | POA: Diagnosis not present

## 2020-07-31 DIAGNOSIS — E1122 Type 2 diabetes mellitus with diabetic chronic kidney disease: Secondary | ICD-10-CM | POA: Diagnosis not present

## 2020-07-31 DIAGNOSIS — I10 Essential (primary) hypertension: Secondary | ICD-10-CM | POA: Diagnosis not present

## 2020-07-31 DIAGNOSIS — E782 Mixed hyperlipidemia: Secondary | ICD-10-CM | POA: Diagnosis not present

## 2020-07-31 DIAGNOSIS — N183 Chronic kidney disease, stage 3 unspecified: Secondary | ICD-10-CM | POA: Diagnosis not present

## 2020-07-31 DIAGNOSIS — F0390 Unspecified dementia without behavioral disturbance: Secondary | ICD-10-CM | POA: Diagnosis not present

## 2020-07-31 DIAGNOSIS — E7800 Pure hypercholesterolemia, unspecified: Secondary | ICD-10-CM | POA: Diagnosis not present

## 2020-07-31 DIAGNOSIS — I480 Paroxysmal atrial fibrillation: Secondary | ICD-10-CM | POA: Diagnosis not present

## 2020-07-31 DIAGNOSIS — E039 Hypothyroidism, unspecified: Secondary | ICD-10-CM | POA: Diagnosis not present

## 2020-08-23 DIAGNOSIS — E7800 Pure hypercholesterolemia, unspecified: Secondary | ICD-10-CM | POA: Diagnosis not present

## 2020-08-23 DIAGNOSIS — F0390 Unspecified dementia without behavioral disturbance: Secondary | ICD-10-CM | POA: Diagnosis not present

## 2020-08-23 DIAGNOSIS — E039 Hypothyroidism, unspecified: Secondary | ICD-10-CM | POA: Diagnosis not present

## 2020-08-23 DIAGNOSIS — E782 Mixed hyperlipidemia: Secondary | ICD-10-CM | POA: Diagnosis not present

## 2020-08-23 DIAGNOSIS — J439 Emphysema, unspecified: Secondary | ICD-10-CM | POA: Diagnosis not present

## 2020-08-23 DIAGNOSIS — I480 Paroxysmal atrial fibrillation: Secondary | ICD-10-CM | POA: Diagnosis not present

## 2020-08-23 DIAGNOSIS — I10 Essential (primary) hypertension: Secondary | ICD-10-CM | POA: Diagnosis not present

## 2020-08-23 DIAGNOSIS — N1832 Chronic kidney disease, stage 3b: Secondary | ICD-10-CM | POA: Diagnosis not present

## 2020-08-23 DIAGNOSIS — N183 Chronic kidney disease, stage 3 unspecified: Secondary | ICD-10-CM | POA: Diagnosis not present

## 2020-08-23 DIAGNOSIS — E1122 Type 2 diabetes mellitus with diabetic chronic kidney disease: Secondary | ICD-10-CM | POA: Diagnosis not present

## 2020-09-02 ENCOUNTER — Encounter (HOSPITAL_COMMUNITY): Payer: Self-pay | Admitting: *Deleted

## 2020-09-02 ENCOUNTER — Observation Stay (HOSPITAL_COMMUNITY)
Admission: EM | Admit: 2020-09-02 | Discharge: 2020-09-04 | Disposition: A | Discharge status: Home / Self Care | Payer: PPO | Attending: Internal Medicine | Admitting: Internal Medicine

## 2020-09-02 ENCOUNTER — Other Ambulatory Visit: Payer: Self-pay

## 2020-09-02 ENCOUNTER — Emergency Department (HOSPITAL_COMMUNITY): Payer: PPO

## 2020-09-02 DIAGNOSIS — F0390 Unspecified dementia without behavioral disturbance: Secondary | ICD-10-CM | POA: Diagnosis not present

## 2020-09-02 DIAGNOSIS — N183 Chronic kidney disease, stage 3 unspecified: Secondary | ICD-10-CM | POA: Diagnosis present

## 2020-09-02 DIAGNOSIS — Z7984 Long term (current) use of oral hypoglycemic drugs: Secondary | ICD-10-CM | POA: Diagnosis not present

## 2020-09-02 DIAGNOSIS — I130 Hypertensive heart and chronic kidney disease with heart failure and stage 1 through stage 4 chronic kidney disease, or unspecified chronic kidney disease: Secondary | ICD-10-CM | POA: Insufficient documentation

## 2020-09-02 DIAGNOSIS — E1122 Type 2 diabetes mellitus with diabetic chronic kidney disease: Secondary | ICD-10-CM | POA: Diagnosis not present

## 2020-09-02 DIAGNOSIS — N1832 Chronic kidney disease, stage 3b: Secondary | ICD-10-CM | POA: Diagnosis not present

## 2020-09-02 DIAGNOSIS — R0789 Other chest pain: Secondary | ICD-10-CM

## 2020-09-02 DIAGNOSIS — Z950 Presence of cardiac pacemaker: Secondary | ICD-10-CM | POA: Diagnosis not present

## 2020-09-02 DIAGNOSIS — I4891 Unspecified atrial fibrillation: Secondary | ICD-10-CM | POA: Diagnosis not present

## 2020-09-02 DIAGNOSIS — E7800 Pure hypercholesterolemia, unspecified: Secondary | ICD-10-CM | POA: Diagnosis present

## 2020-09-02 DIAGNOSIS — Z79899 Other long term (current) drug therapy: Secondary | ICD-10-CM | POA: Insufficient documentation

## 2020-09-02 DIAGNOSIS — R110 Nausea: Secondary | ICD-10-CM | POA: Diagnosis not present

## 2020-09-02 DIAGNOSIS — Z7902 Long term (current) use of antithrombotics/antiplatelets: Secondary | ICD-10-CM | POA: Insufficient documentation

## 2020-09-02 DIAGNOSIS — Z87891 Personal history of nicotine dependence: Secondary | ICD-10-CM | POA: Diagnosis not present

## 2020-09-02 DIAGNOSIS — E0849 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: Secondary | ICD-10-CM | POA: Diagnosis present

## 2020-09-02 DIAGNOSIS — R079 Chest pain, unspecified: Secondary | ICD-10-CM | POA: Diagnosis not present

## 2020-09-02 DIAGNOSIS — J449 Chronic obstructive pulmonary disease, unspecified: Secondary | ICD-10-CM | POA: Insufficient documentation

## 2020-09-02 DIAGNOSIS — I5023 Acute on chronic systolic (congestive) heart failure: Secondary | ICD-10-CM | POA: Diagnosis not present

## 2020-09-02 DIAGNOSIS — I2510 Atherosclerotic heart disease of native coronary artery without angina pectoris: Secondary | ICD-10-CM | POA: Insufficient documentation

## 2020-09-02 DIAGNOSIS — I120 Hypertensive chronic kidney disease with stage 5 chronic kidney disease or end stage renal disease: Secondary | ICD-10-CM | POA: Diagnosis not present

## 2020-09-02 DIAGNOSIS — I10 Essential (primary) hypertension: Secondary | ICD-10-CM | POA: Diagnosis present

## 2020-09-02 DIAGNOSIS — Z20822 Contact with and (suspected) exposure to covid-19: Secondary | ICD-10-CM | POA: Diagnosis not present

## 2020-09-02 DIAGNOSIS — I480 Paroxysmal atrial fibrillation: Secondary | ICD-10-CM | POA: Diagnosis present

## 2020-09-02 DIAGNOSIS — S2231XA Fracture of one rib, right side, initial encounter for closed fracture: Secondary | ICD-10-CM | POA: Diagnosis not present

## 2020-09-02 LAB — GLUCOSE, CAPILLARY: Glucose-Capillary: 164 mg/dL — ABNORMAL HIGH (ref 70–99)

## 2020-09-02 LAB — TROPONIN I (HIGH SENSITIVITY)
Troponin I (High Sensitivity): 13 ng/L (ref <18)
Troponin I (High Sensitivity): 11 ng/L (ref <18)

## 2020-09-02 LAB — BASIC METABOLIC PANEL
Anion gap: 12 (ref 5–15)
BUN: 28 mg/dL — ABNORMAL HIGH (ref 8–23)
CO2: 23 mmol/L (ref 22–32)
Calcium: 9.4 mg/dL (ref 8.9–10.3)
Chloride: 105 mmol/L (ref 98–111)
Creatinine, Ser: 1.63 mg/dL — ABNORMAL HIGH (ref 0.61–1.24)
GFR, Estimated: 41 mL/min — ABNORMAL LOW (ref >60)
Glucose, Bld: 182 mg/dL — ABNORMAL HIGH (ref 70–99)
Potassium: 3.9 mmol/L (ref 3.5–5.1)
Sodium: 140 mmol/L (ref 135–145)

## 2020-09-02 LAB — CBC
HCT: 43.7 % (ref 39.0–52.0)
Hemoglobin: 13.7 g/dL (ref 13.0–17.0)
MCH: 26.5 pg (ref 26.0–34.0)
MCHC: 31.4 g/dL (ref 30.0–36.0)
MCV: 84.5 fL (ref 80.0–100.0)
Platelets: 249 10*3/uL (ref 150–400)
RBC: 5.17 MIL/uL (ref 4.22–5.81)
RDW: 17.5 % — ABNORMAL HIGH (ref 11.5–15.5)
WBC: 9.9 10*3/uL (ref 4.0–10.5)
nRBC: 0 % (ref 0.0–0.2)

## 2020-09-02 LAB — PROTIME-INR
INR: 1.3 — ABNORMAL HIGH (ref 0.8–1.2)
Prothrombin Time: 15.7 seconds — ABNORMAL HIGH (ref 11.4–15.2)

## 2020-09-02 LAB — RESP PANEL BY RT-PCR (FLU A&B, COVID) ARPGX2
Influenza A by PCR: NEGATIVE
Influenza B by PCR: NEGATIVE
SARS Coronavirus 2 by RT PCR: NEGATIVE

## 2020-09-02 LAB — CBG MONITORING, ED: Glucose-Capillary: 156 mg/dL — ABNORMAL HIGH (ref 70–99)

## 2020-09-02 MED ORDER — ATORVASTATIN CALCIUM 80 MG PO TABS
80.0000 mg | ORAL_TABLET | Freq: Every evening | ORAL | Status: DC
Start: 2020-09-02 — End: 2020-09-04
  Administered 2020-09-02 – 2020-09-03 (×2): 80 mg via ORAL
  Filled 2020-09-02 (×2): qty 1
  Filled 2020-09-02: qty 8

## 2020-09-02 MED ORDER — ACETAMINOPHEN 325 MG PO TABS
650.0000 mg | ORAL_TABLET | ORAL | Status: DC | PRN
  Administered 2020-09-02 – 2020-09-04 (×2): 650 mg via ORAL
  Filled 2020-09-02 (×3): qty 2

## 2020-09-02 MED ORDER — ASPIRIN 81 MG PO CHEW
324.0000 mg | CHEWABLE_TABLET | Freq: Once | ORAL | Status: AC
  Administered 2020-09-02: 324 mg via ORAL
  Filled 2020-09-02: qty 4

## 2020-09-02 MED ORDER — ALUM & MAG HYDROXIDE-SIMETH 200-200-20 MG/5ML PO SUSP
30.0000 mL | Freq: Once | ORAL | Status: AC
  Administered 2020-09-02: 30 mL via ORAL
  Filled 2020-09-02: qty 30

## 2020-09-02 MED ORDER — NITROGLYCERIN 0.4 MG SL SUBL
0.4000 mg | SUBLINGUAL_TABLET | SUBLINGUAL | Status: DC | PRN
  Administered 2020-09-02 (×2): 0.4 mg via SUBLINGUAL
  Filled 2020-09-02: qty 1

## 2020-09-02 MED ORDER — DONEPEZIL HCL 10 MG PO TABS
10.0000 mg | ORAL_TABLET | Freq: Every morning | ORAL | Status: DC
  Administered 2020-09-02 – 2020-09-04 (×3): 10 mg via ORAL
  Filled 2020-09-02: qty 2
  Filled 2020-09-02 (×2): qty 1

## 2020-09-02 MED ORDER — INSULIN ASPART 100 UNIT/ML ~~LOC~~ SOLN
0.0000 [IU] | Freq: Three times a day (TID) | SUBCUTANEOUS | Status: DC
  Administered 2020-09-04: 2 [IU] via SUBCUTANEOUS
  Administered 2020-09-04: 3 [IU] via SUBCUTANEOUS

## 2020-09-02 MED ORDER — ONDANSETRON HCL 4 MG/2ML IJ SOLN: 4.0000 mg | Freq: Four times a day (QID) | INTRAMUSCULAR | Status: DC | PRN

## 2020-09-02 MED ORDER — SODIUM CHLORIDE 0.9 % IV BOLUS
1000.0000 mL | Freq: Once | INTRAVENOUS | Status: AC
  Administered 2020-09-02: 1000 mL via INTRAVENOUS

## 2020-09-02 MED ORDER — PNEUMOCOCCAL VAC POLYVALENT 25 MCG/0.5ML IJ INJ: 0.5000 mL | INJECTION | INTRAMUSCULAR | Status: DC

## 2020-09-02 MED ORDER — APIXABAN 2.5 MG PO TABS
2.5000 mg | ORAL_TABLET | Freq: Two times a day (BID) | ORAL | Status: DC
  Administered 2020-09-02 – 2020-09-04 (×5): 2.5 mg via ORAL
  Filled 2020-09-02 (×6): qty 1

## 2020-09-02 MED ORDER — MEMANTINE HCL 10 MG PO TABS
10.0000 mg | ORAL_TABLET | Freq: Two times a day (BID) | ORAL | Status: DC
  Administered 2020-09-02 – 2020-09-04 (×5): 10 mg via ORAL
  Filled 2020-09-02 (×6): qty 1

## 2020-09-02 MED ORDER — LEVOTHYROXINE SODIUM 50 MCG PO TABS
50.0000 ug | ORAL_TABLET | Freq: Every day | ORAL | Status: DC
  Administered 2020-09-03 – 2020-09-04 (×2): 50 ug via ORAL
  Filled 2020-09-02 (×3): qty 1

## 2020-09-02 MED ORDER — MELATONIN 3 MG PO TABS
3.0000 mg | ORAL_TABLET | Freq: Every evening | ORAL | Status: DC | PRN
Start: 2020-09-02 — End: 2020-09-04
  Administered 2020-09-03 (×2): 3 mg via ORAL
  Filled 2020-09-02 (×2): qty 1

## 2020-09-02 MED ORDER — SODIUM CHLORIDE 0.9 % IV SOLN: INTRAVENOUS | Status: DC

## 2020-09-02 MED ORDER — AMLODIPINE BESYLATE 5 MG PO TABS
5.0000 mg | ORAL_TABLET | Freq: Every day | ORAL | Status: DC
  Administered 2020-09-02 – 2020-09-04 (×3): 5 mg via ORAL
  Filled 2020-09-02 (×3): qty 1

## 2020-09-02 NOTE — ED Notes (Signed)
Pacemaker interrogated. 

## 2020-09-02 NOTE — H&P (Signed)
History and Physical    Edward RoyalSheldon I Thaden ZOX:[id number] DOB: 10-28-1931 DOA: 09/02/2020  PCP: Renford DillsPolite, Ronald, MD Consultants:  Mayford Knifeurner - cardiology; Dohmeier - neurology Patient coming from:  [facility] - lives with wife; Jackey LogeOK: Wife, [phone]; [phone]  Chief Complaint: Chest pain  HPI: Edward Suarez is a 86 y.o. male with medical history significant of afib; HTN; HLD; glaucoma; DM; COPD; pacemaker placement; dementia; and stage 3 CKD presenting with chest pain.  He complained of neck, shoulder, and chest pain yesterday.  His wife thought he might have pulled a muscle and it resolved with Aleve.  This AM, he woke up early and got dressed, complaining of neck, back, and chest pain today.  It got worse and they came in.  Non-exertional pain.  Appears to be improved.      ED Course:   Woke up about 4AM with severe CP, recurrent.  Near syncope in radiology.  EKG is paced, interrogation negative during pre-syncope.  HEART score is 5, needs observation.  Review of Systems: As per HPI; otherwise review of systems reviewed and negative.   Ambulatory Status:  Ambulates without assistance  COVID Vaccine Status:   Complete plus booster  Past Medical History:  Diagnosis Date  . Allergic rhinitis, cause unspecified   . BPH (benign prostatic hyperplasia)   . Carotid artery stenosis, asymptomatic    1-39% by dopplers 09/2016 followed by Dr. Darrick PennaFields  . Cataracts, bilateral   . Chronic kidney disease (CKD), stage III (moderate) (HCC)   . Complete heart block (HCC) 01/23/04   s/p Medtronic PPM implanted by Dr Amil AmenEdmunds  . COPD (chronic obstructive pulmonary disease) (HCC)   . Diabetes mellitus   . Glaucoma   . Hyperlipidemia   . Hypertension   . 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 Kidney stones   . PAF (paroxysmal atrial fibrillation) (HCC) 10/09/2014   Noted on pacer check with 88 mode switches and longest episode >1 hour.  Now on Apixiban for CHADS2VASC score  of 5    Past Surgical History:  Procedure Laterality Date  . PACEMAKER INSERTION  2005, 04/16/12   MDT implanted by Dr Amil AmenEdmunds with generator chnage (MDT Adapta L) by Dr Johney FrameAllred 04/16/12  . PERMANENT PACEMAKER GENERATOR CHANGE N/A 04/16/2012   Procedure: PERMANENT PACEMAKER GENERATOR CHANGE;  Surgeon: Hillis RangeJames Allred, MD;  [facility];  Service: Cardiovascular;  Laterality: N/A;    Social History   Socioeconomic History  . Marital status: Married    Spouse name: Marylouise StacksWilma  . Number of children: 2  . Years of education: BS  . Highest education level: Not on file  Occupational History  . Occupation: retired  Tobacco Use  . Smoking status: Former Smoker    Packs/day: 1.00    Years: 25.00    Pack years: 25.00    Types: Cigarettes    Quit date: 09/16/1998    Years since quitting: 21.9  . Smokeless tobacco: Never Used  Vaping Use  . Vaping Use: Never used  Substance and Sexual Activity  . Alcohol use: Yes    Alcohol/week: 5.0 standard drinks    Types: 2 Glasses of wine, 3 Shots of liquor per week    Comment: drinks a tequila shot daily  . Drug use: No  . Sexual activity: Not on file  Other Topics Concern  . 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   Patient is married with  2 children.   Patient is right handed.   Patient has BS degree.   Patient drinks 1 cup daily.         Social Determinants of Health   Financial Resource Strain: Not on file  Food Insecurity: Not on file  Transportation Needs: Not on file  Physical Activity: Not on file  Stress: Not on file  Social Connections: Not on file  Intimate Partner Violence: Not on file    Allergies  Allergen Reactions  . Penicillins Rash    Family History  Problem Relation Age of Onset  . Heart disease Father   . Heart attack Father     Prior to Admission medications   Medication Sig Start Date End Date Taking? Authorizing Provider  amLODipine (NORVASC) 5 MG  tablet TAKE 1 TABLET BY MOUTH EVERY DAY Patient taking differently: Take 5 mg by mouth daily.  09/25/17   Quintella Reichert, MD  apixaban (ELIQUIS) 2.5 MG TABS tablet Take 1 tablet (2.5 mg total) by mouth 2 (two) times daily. 06/16/18 06/16/19  Quintella Reichert, MD  atorvastatin (LIPITOR) 80 MG tablet TAKE 1 TABLET (80 MG TOTAL) BY MOUTH EVERY EVENING. 04/06/18   Quintella Reichert, MD  Calcium Carb-Cholecalciferol (CALCIUM 1000 + D PO) Take by mouth. OTC    [provider]  cetirizine (ZYRTEC) 10 MG tablet Take 10 mg by mouth daily. For allergies. Takes prn    [provider]  donepezil (ARICEPT) 10 MG tablet TAKE ONE TABLET BY MOUTH EVERY EVENING 02/23/20   Butch Penny, NP  fluticasone (FLONASE) 50 MCG/ACT nasal spray Place 2 sprays into both nostrils daily as needed for allergies or rhinitis.    [provider]  glipiZIDE (GLUCOTROL) 5 MG tablet Take 5 mg by mouth daily before breakfast.      [provider]  levothyroxine (SYNTHROID, LEVOTHROID) 50 MCG tablet Take 50 mcg by mouth daily before breakfast.  04/06/18   [provider]  memantine (NAMENDA) 10 MG tablet Take 1 tablet (10 mg total) by mouth 2 (two) times daily. 02/17/19   Butch Penny, NP  pioglitazone (ACTOS) 45 MG tablet Take 45 mg by mouth daily.  11/24/15   [provider]    Physical Exam: Vitals:   09/02/20 1000 09/02/20 1030 09/02/20 1100 09/02/20 1115  BP: (!) 143/78 130/65 (!) 146/102 140/69  Pulse: 75 70 81 69  Resp: 16 17 (!) 22 14  Temp:      SpO2: 99% 98% 97% 97%  Weight:      Height:         . General:  Appears calm and comfortable and is in NAD, noticeable cognitive impairment . Eyes:  PERRL, EOMI, normal lids, iris . ENT:  grossly normal hearing, lips & tongue, mmm . Neck:  no LAD, masses or thyromegaly . Cardiovascular:  RRR, no m/r/g.1+ LE edema. No palpable chest wall tenderness. [name] Kitchen Respiratory:   CTA bilaterally with no wheezes/rales/rhonchi.  Normal  respiratory effort. . Abdomen:  soft, NT, ND . Skin:  no rash or induration seen on limited exam . Musculoskeletal:  grossly normal tone BUE/BLE, good ROM, no bony abnormality . Psychiatric:  grossly normal mood and affect, speech fluent and appropriate, AOx1 with cognitive impairment . Neurologic:  CN 2-12 grossly intact, moves all extremities in coordinated fashion    Radiological Exams on Admission: Independently reviewed - see discussion in A/P where applicable  DG Chest 2 View  Result Date: 09/02/2020 CLINICAL DATA:  Acute  onset chest pain and nausea beginning this morning. EXAM: CHEST - 2 VIEW COMPARISON:  03/11/2017 FINDINGS: The heart size and mediastinal contours are within normal limits. Dual lead transvenous pacemaker remains in appropriate position. Aortic atherosclerotic calcification noted. Both lungs are clear. Multiple old right rib fracture deformities again seen. IMPRESSION: Stable exam.  No active cardiopulmonary disease. Electronically Signed   By: Danae Orleans M.D.   On: 09/02/2020 07:42    EKG: Independently reviewed.  AV paced with rate 70; NSCSLT   Labs on Admission: I have personally reviewed the available labs and imaging studies at the time of the admission.  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   -Initial cardiac HS troponin negative and negative repeat/Delta.  -EKG not indicative of acute ischemia.   -HEART pathway score is 6, indicating that the patient has an elevated risk score and requires further evaluation. -Will plan to place in observation status on telemetry to rule out ACS by overnight observation.  -He does not appear to be a good candidate for intervention, but his wife would appreciate cardiology evaluation; Dr. Wyline Mood to see.  HTN -Continue Noravsc -Will also add prn hydralazine  HLD -Continue Lipitor  DM -Hold Actos, Glucotrol -Will cover with moderate-scale SSI for now -Consider alternate agent rather than sulfonylurea due to risk of hypoglycemia -Tight glycemic control is not essential at this time  Dementia -Moderate to severe based on today's evaluation -Continue Aricept, Namenda -Wife is still able to care for him in independent living  Afib -Rate controlled with pacemaker -Continue Eliquis  Stage 3b CKD -Appears to be stable at this time -Will follow -If cath is planned, may need IVF hydration pre-procedure    Note: This patient has been tested and is negative for the novel coronavirus COVID-19. He has been fully vaccinated against COVID-19.    DVT prophylaxis: Eliquis  Code Status:  DNR - confirmed with patient/family Family Communication: Wife was present throughout evaluation Disposition Plan:  The patient is from: home  Anticipated d/c is to: home without [facility] services   Anticipated d/c date will depend on clinical response to treatment, but possibly as early as tomorrow if he has excellent response to treatment  Patient is currently: acutely ill Consults called: Cardiology  Admission status: It is my clinical opinion that referral for OBSERVATION is reasonable and necessary in this patient based on the above information provided. The aforementioned taken together are felt to place the patient at  high risk for further clinical deterioration. However it is anticipated that the patient may be medically stable for discharge from the hospital within 24 to 48 hours.     Jonah Blue MD [facility]   How to contact the [facility] Attending or Consulting provider 7A - 7P or covering provider during after hours 7P -7A, for this patient?  1. Check the care team in [facility] and look for  a) attending/consulting [facility] provider listed and b) the [facility] team listed 2. Log into [email or web address] and use [place]'s universal password to access. If you do not have the password, please contact the hospital operator. 3. Locate the [facility] provider you are looking for under [facility] and page to a number that you can be directly reached. 4. If you still have difficulty reaching the provider, please page the [facility] (Director on Call) for the Hospitalists listed on amion for assistance.   09/02/2020, 12:48 PM

## 2020-09-02 NOTE — ED Notes (Signed)
Pt ambulating to bathroom with wife

## 2020-09-02 NOTE — ED Triage Notes (Addendum)
The pt is c/o chest pain with nausea since approx 0400 he has not taken aspirin or nitro  The pt has a pacemaker that appears to be pacing each beat

## 2020-09-02 NOTE — Consult Note (Signed)
Cardiology Consultation:   Patient ID: Edward Suarez MRN: [id number]; DOB: [birth year]  Admit date: 09/02/2020 Date of Consult: 09/02/2020  Primary Care Provider: Renford Dills, MD [facility] Cardiologist: Armanda Magic, MD  [facility] Electrophysiologist:  None    Patient Profile:   Edward Suarez is a 86 y.o. male with a hx of HTN, DM2, HL, complete heart block with pacemaker, mild carotid artery stenosis, PAF, CKD 3,  who is being seen today for the evaluation of chest pain at the request of Dr Ophelia Charter.  History of Present Illness:   Edward Suarez 86 yo male history of HTN, DM2, HL, complete heart block with pacemaker, mild carotid artery stenosis, PAF, CKD 3, dementia admitted with chest pain and nausea.   The patient is not able to offer any history due to significant dementia. When asked directly if he had chest pain this AM he reports he does not remember. His wife reports she told him he had neck and shoulder pain yesterday, and chest pain this AM. It is not possible to get a precise history beyond this.   Near syncope during CXR with nausea. Normal device interrogation in ER.    K 3.9 Cr 1.63 WBC 9.9 Hgb 13.7 Plt 249  hstrop 13-->11 COVID neg CXR no acute process EKG AV paced Past Medical History:  Diagnosis Date  . Allergic rhinitis, cause unspecified   . BPH (benign prostatic hyperplasia)   . Carotid artery stenosis, asymptomatic    1-39% by dopplers 09/2016 followed by Dr. Darrick Penna  . Cataracts, bilateral   . Chronic kidney disease (CKD), stage III (moderate) (HCC)   . Complete heart block (HCC) 01/23/04   s/p Medtronic PPM implanted by Dr Amil Amen  . COPD (chronic obstructive pulmonary disease) (HCC)   . Diabetes mellitus   . Glaucoma   . Hyperlipidemia   . Hypertension   . 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 Kidney stones   . PAF (paroxysmal atrial fibrillation) (HCC) 10/09/2014   Noted on pacer check with 88 mode switches and longest episode >1  hour.  Now on Apixiban for CHADS2VASC score of 5    Past Surgical History:  Procedure Laterality Date  . PACEMAKER INSERTION  2005, 04/16/12   MDT implanted by Dr Amil Amen with generator chnage (MDT Adapta L) by Dr Johney Frame 04/16/12  . PERMANENT PACEMAKER GENERATOR CHANGE N/A 04/16/2012   Procedure: PERMANENT PACEMAKER GENERATOR CHANGE;  Surgeon: Hillis Range, MD;  Location: [facility] CATH LAB;  Service: Cardiovascular;  Laterality: N/A;     Inpatient Medications: Scheduled Meds: . amLODipine  5 mg Oral Daily  . apixaban  2.5 mg Oral BID  . atorvastatin  80 mg Oral QPM  . donepezil  10 mg Oral q AM  . insulin aspart  0-15 Units Subcutaneous TID WC  . [START ON 09/03/2020] levothyroxine  50 mcg Oral QAC breakfast  . memantine  10 mg Oral BID   Continuous Infusions:  PRN Meds: acetaminophen, nitroGLYCERIN, ondansetron (ZOFRAN) IV  Allergies:    Allergies  Allergen Reactions  . Penicillins Rash    Social History:   Social History   Socioeconomic History  . Marital status: Married    Spouse name: [name]  . Number of children: 2  . Years of education: BS  . Highest education level: Not on file  Occupational History  . Occupation: retired  Tobacco Use  . Smoking status: Former Smoker    Packs/day: 1.00    Years: 25.00  Pack years: 25.00    Types: Cigarettes    Quit date: 09/16/1998    Years since quitting: 21.9  . Smokeless tobacco: Never Used  Vaping Use  . Vaping Use: Never used  Substance and Sexual Activity  . Alcohol use: Yes    Alcohol/week: 5.0 standard drinks    Types: 2 Glasses of wine, 3 Shots of liquor per week    Comment: drinks a tequila shot daily  . Drug use: No  . Sexual activity: Not on file  Other Topics Concern  . Not on file  Social History Narrative   Retired Mudlogger.  Lives in [place].   Patient is married with 2 children.   Patient is right handed.   Patient has BS degree.   Patient drinks 1 cup daily.         Social Determinants  of Health   Financial Resource Strain: Not on file  Food Insecurity: Not on file  Transportation Needs: Not on file  Physical Activity: Not on file  Stress: Not on file  Social Connections: Not on file  Intimate Partner Violence: Not on file    Family History:   Family History  Problem Relation Age of Onset  . Heart disease Father   . Heart attack Father      ROS:  Please see the history of present illness.   All other ROS reviewed and negative.     Physical Exam/Data:   Vitals:   09/02/20 1000 09/02/20 1030 09/02/20 1100 09/02/20 1115  BP: (!) 143/78 130/65 (!) 146/102 140/69  Pulse: 75 70 81 69  Resp: 16 17 (!) 22 14  Temp:      SpO2: 99% 98% 97% 97%  Weight:      Height:       No intake or output data in the 24 hours ending 09/02/20 1219 Last 3 Weights 09/02/2020 07/11/2020 01/03/2020  Weight (lbs) 155 lb 6.8 oz 155 lb 6.4 oz 153 lb  Weight (kg) 70.5 kg 70.489 kg 69.4 kg     Body mass index is 23.63 kg/m.  General:  Well nourished, well developed, in no acute distress HEENT: normal Lymph: no adenopathy Neck: no JVD Endocrine:  No thryomegaly Vascular: No carotid bruits; FA pulses 2+ bilaterally without bruits  Cardiac:  normal S1, S2; RRR; no murmur Lungs:  clear to auscultation bilaterally, no wheezing, rhonchi or rales  Abd: soft, nontender, no hepatomegaly  Ext: no edema Musculoskeletal:  No deformities, BUE and BLE strength normal and equal Skin: warm and dry  Neuro:  CNs 2-12 intact, no focal abnormalities noted Psych:  Normal affect     Laboratory Data:  High Sensitivity Troponin:   Recent Labs  Lab 09/02/20 0635 09/02/20 0830  TROPONINIHS 13 11     Chemistry Recent Labs  Lab 09/02/20 0635  NA 140  K 3.9  CL 105  CO2 23  GLUCOSE 182*  BUN 28*  CREATININE 1.63*  CALCIUM 9.4  GFRNONAA 41*  ANIONGAP 12    No results for input(s): PROT, ALBUMIN, AST, ALT, ALKPHOS, BILITOT in the last 168 hours. Hematology Recent Labs  Lab  09/02/20 0635  WBC 9.9  RBC 5.17  HGB 13.7  HCT 43.7  MCV 84.5  MCH 26.5  MCHC 31.4  RDW 17.5*  PLT 249   BNPNo results for input(s): BNP, PROBNP in the last 168 hours.  DDimer No results for input(s): DDIMER in the last 168 hours.   Radiology/Studies:  DG Chest  2 View  Result Date: 09/02/2020 CLINICAL DATA:  Acute onset chest pain and nausea beginning this morning. EXAM: CHEST - 2 VIEW COMPARISON:  03/11/2017 FINDINGS: The heart size and mediastinal contours are within normal limits. Dual lead transvenous pacemaker remains in appropriate position. Aortic atherosclerotic calcification noted. Both lungs are clear. Multiple old right rib fracture deformities again seen. IMPRESSION: Stable exam.  No active cardiopulmonary disease. Electronically Signed   By: Danae Orleans M.D.   On: 09/02/2020 07:42     Assessment and Plan:   1. Chest pain - unable to assess any specific from history, patient has fairly significant dementia in talking with him.  - EKG AV paced cannot interpret for ishcemia, enzymes negative.  - can f/u echo, I would not pursue any form of ischemic testing given lack of definitie history, negative enzymes, and significant dementia that would limit his candidacy for a cath. If benign echo no further management   2. Dizziness -labs would suggets he may be dry, Symptoms occurred with standing for CXR, would also support possible orthostaltic dizziness - normal pacemaker check in ER - check orthostatics (of note has already reecived some IVFs  We will f/u echo peripherally, no further recs at this time.     For questions or updates, please contact [facility] Please consult [email or web address] for contact info under    Signed, Dina Rich, MD  09/02/2020 12:19 PM

## 2020-09-02 NOTE — ED Notes (Signed)
Lunch Tray Ordered @ 1242.

## 2020-09-02 NOTE — ED Notes (Signed)
Pt denies chest pain at this time.

## 2020-09-02 NOTE — ED Notes (Signed)
6E is unable to take report at this time. Per [facility] charge nurse has not approved the patient yet

## 2020-09-02 NOTE — ED Provider Notes (Signed)
[facility] EMERGENCY DEPARTMENT Provider Note   CSN: [id number] Arrival date & time: 09/02/20  3354     History Chief Complaint  Patient presents with  . Chest Pain    Edward Suarez is a 86 y.o. male.  Pt presents to the ED today with cp and nausea.  Pt has Alzheimer's disease, so his wife gives most of the hx.  Apparently, he woke up around 0400 with the cp.  He has not taken anything for his sx.  He has not taken any of his morning meds.  Pt's wife said he nearly passed out in the xray room during his CXR and became nauseous.  He denies nausea now.  He denies cp now.        Past Medical History:  Diagnosis Date  . Allergic rhinitis, cause unspecified   . BPH (benign prostatic hyperplasia)   . Carotid artery stenosis, asymptomatic    1-39% by dopplers 09/2016 followed by Dr. Darrick Penna  . Cataracts, bilateral   . Chronic kidney disease (CKD), stage III (moderate) (HCC)   . Complete heart block (HCC) 01/23/04   s/p Medtronic PPM implanted by Dr Amil Amen  . COPD (chronic obstructive pulmonary disease) (HCC)   . Diabetes mellitus   . Glaucoma   . Hyperlipidemia   . Hypertension   . 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 Kidney stones   . PAF (paroxysmal atrial fibrillation) (HCC) 10/09/2014   Noted on pacer check with 88 mode switches and longest episode >1 hour.  Now on Apixiban for CHADS2VASC score of 5    Patient Active Problem List   Diagnosis Date Noted  . Dementia arising in the senium and presenium (HCC) 11/19/2017  . PAF (paroxysmal atrial fibrillation) (HCC) 11/06/2016  . 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 Cervicalgia 10/19/2015  . Cardiac pacemaker in situ 10/19/2015  . Chronic renal insufficiency, stage III (moderate) (HCC) 11/21/2014  . 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 Allergic rhinitis 08/10/2014  . Hypercholesterolemia without hypertriglyceridemia 08/10/2014  . Snoring  06/15/2014  . Memory loss, short term 06/15/2014  . MCI (mild cognitive impairment) with memory loss 06/15/2014  . 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 PVC (premature ventricular contraction) 05/05/2014  . 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 Amnesia 05/04/2014  . Pacemaker-Medtronic 04/17/2012  . Hypertension 04/15/2012  . Atrioventricular block, complete (HCC) 04/13/2012  . Carotid stenosis, bilateral 02/07/2012    Past Surgical History:  Procedure Laterality Date  . PACEMAKER INSERTION  2005, 04/16/12   MDT implanted by Dr Amil Amen with generator chnage (MDT Adapta L) by Dr Johney Frame 04/16/12  . PERMANENT PACEMAKER GENERATOR CHANGE N/A 04/16/2012   Procedure: PERMANENT PACEMAKER GENERATOR CHANGE;  Surgeon: Hillis Range, MD;  Location: [facility] CATH LAB;  Service: Cardiovascular;  Laterality: N/A;       Family History  Problem Relation Age of Onset  . Heart disease Father   . Heart attack Father     Social History   Tobacco Use  . Smoking status: Former Smoker    Types: Cigarettes    Quit date: 09/16/1998    Years since quitting: 21.9  . Smokeless tobacco: Never Used  Vaping Use  . Vaping Use: Never used  Substance Use Topics  . Alcohol use: Yes    Alcohol/week: 5.0 standard drinks    Types: 2 Glasses of wine, 3 Shots of liquor per week  Comment: drinks a tequila shot daily  . Drug use: No    Home Medications Prior to Admission medications   Medication Sig Start Date End Date Taking? Authorizing Provider  amLODipine (NORVASC) 5 MG tablet TAKE 1 TABLET BY MOUTH EVERY DAY Patient taking differently: Take 5 mg by mouth daily.  09/25/17   Quintella Reichert, MD  apixaban (ELIQUIS) 2.5 MG TABS tablet Take 1 tablet (2.5 mg total) by mouth 2 (two) times daily. 06/16/18 06/16/19  Quintella Reichert, MD  atorvastatin (LIPITOR) 80 MG tablet TAKE 1 TABLET (80 MG TOTAL) BY MOUTH EVERY EVENING. 04/06/18   Quintella Reichert, MD   Calcium Carb-Cholecalciferol (CALCIUM 1000 + D PO) Take by mouth. OTC    [provider]  cetirizine (ZYRTEC) 10 MG tablet Take 10 mg by mouth daily. For allergies. Takes prn    [provider]  donepezil (ARICEPT) 10 MG tablet TAKE ONE TABLET BY MOUTH EVERY EVENING 02/23/20   Butch Penny, NP  fluticasone (FLONASE) 50 MCG/ACT nasal spray Place 2 sprays into both nostrils daily as needed for allergies or rhinitis.    [provider]  glipiZIDE (GLUCOTROL) 5 MG tablet Take 5 mg by mouth daily before breakfast.      [provider]  levothyroxine (SYNTHROID, LEVOTHROID) 50 MCG tablet Take 50 mcg by mouth daily before breakfast.  04/06/18   [provider]  memantine (NAMENDA) 10 MG tablet Take 1 tablet (10 mg total) by mouth 2 (two) times daily. 02/17/19   Butch Penny, NP  pioglitazone (ACTOS) 45 MG tablet Take 45 mg by mouth daily.  11/24/15   [provider]    Allergies    Penicillins  Review of Systems   Review of Systems  Cardiovascular: Positive for chest pain.  Gastrointestinal: Positive for nausea.  All other systems reviewed and are negative.   Physical Exam Updated Vital Signs BP 138/69   Pulse 71   Temp 98.6 F (37 C)   Resp 13   Ht 5\' 8"  (1.727 m)   Wt 70.5 kg   SpO2 95%   BMI 23.63 kg/m   Physical Exam Vitals and nursing note reviewed.  Constitutional:      Appearance: He is well-developed.  HENT:     Head: Normocephalic and atraumatic.  Eyes:     Extraocular Movements: Extraocular movements intact.     Pupils: Pupils are equal, round, and reactive to light.  Cardiovascular:     Rate and Rhythm: Normal rate and regular rhythm.     Heart sounds: Normal heart sounds.  Pulmonary:     Effort: Pulmonary effort is normal.     Breath sounds: Normal breath sounds.  Abdominal:     General: Bowel sounds are normal.     Palpations: Abdomen is soft.  Musculoskeletal:        General: Normal range of motion.      Cervical back: Normal range of motion and neck supple.  Skin:    General: Skin is warm.     Capillary Refill: Capillary refill takes less than 2 seconds.  Neurological:     Mental Status: He is alert. Mental status is at baseline.  Psychiatric:        Mood and Affect: Mood normal.        Behavior: Behavior normal.     ED Results / Procedures / Treatments   Labs (all labs ordered are listed, but only abnormal results are displayed) Labs Reviewed  BASIC METABOLIC PANEL -  Abnormal; Notable for the following components:      Result Value   Glucose, Bld 182 (*)    BUN 28 (*)    Creatinine, Ser 1.63 (*)    GFR, Estimated 41 (*)    All other components within normal limits  CBC - Abnormal; Notable for the following components:   RDW 17.5 (*)    All other components within normal limits  PROTIME-INR - Abnormal; Notable for the following components:   Prothrombin Time 15.7 (*)    INR 1.3 (*)    All other components within normal limits  URINALYSIS, ROUTINE W REFLEX MICROSCOPIC  CBG MONITORING, ED  TROPONIN I (HIGH SENSITIVITY)  TROPONIN I (HIGH SENSITIVITY)    EKG EKG Interpretation  Date/Time:  Saturday September 02 2020 06:17:43 EST Ventricular Rate:  70 PR Interval:    QRS Duration: 222 QT Interval:  520 QTC Calculation: 561 R Axis:   -62 Text Interpretation: AV dual-paced rhythm with occasional atrial-paced complexes Abnormal ECG No significant change since last tracing Confirmed by Rochele Raring [phone]) on 09/02/2020 6:30:07 AM   Radiology DG Chest 2 View  Result Date: 09/02/2020 CLINICAL DATA:  Acute onset chest pain and nausea beginning this morning. EXAM: CHEST - 2 VIEW COMPARISON:  03/11/2017 FINDINGS: The heart size and mediastinal contours are within normal limits. Dual lead transvenous pacemaker remains in appropriate position. Aortic atherosclerotic calcification noted. Both lungs are clear. Multiple old right rib fracture deformities again seen. IMPRESSION:  Stable exam.  No active cardiopulmonary disease. Electronically Signed   By: Danae Orleans M.D.   On: 09/02/2020 07:42    Procedures Procedures (including critical care time)  Medications Ordered in ED Medications  sodium chloride 0.9 % bolus 1,000 mL (0 mLs Intravenous Stopped 09/02/20 0831)    And  0.9 %  sodium chloride infusion ( Intravenous New Bag/Given 09/02/20 0859)  nitroGLYCERIN (NITROSTAT) SL tablet 0.4 mg (0.4 mg Sublingual Given 09/02/20 0930)  alum & mag hydroxide-simeth (MAALOX/MYLANTA) 200-200-20 MG/5ML suspension 30 mL (has no administration in time range)  aspirin chewable tablet 324 mg (324 mg Oral Given 09/02/20 0913)    ED Course  I have reviewed the triage vital signs and the nursing notes.  Pertinent labs & imaging results that were available during my care of the patient were reviewed by me and considered in my medical decision making (see chart for details).    MDM Rules/Calculators/A&P                          Pt given asa and nitro.  CP has improved, but it is still there. GI cocktail given.  Heart score of 5.  I think he needs to be observed overnight.  Pt d/w Dr. Ophelia Charter ([facility]) who will put him in for obs.  Final Clinical Impression(s) / ED Diagnoses Final diagnoses:  Chest pain, unspecified type  Stage 3b chronic kidney disease (HCC)    Rx / DC Orders ED Discharge Orders    None       Jacalyn Lefevre, MD 09/02/20 1036

## 2020-09-02 NOTE — ED Notes (Signed)
Pt continues to have chest pain. Unable to give a number. Second [employer] given

## 2020-09-03 ENCOUNTER — Observation Stay (HOSPITAL_BASED_OUTPATIENT_CLINIC_OR_DEPARTMENT_OTHER): Payer: PPO

## 2020-09-03 DIAGNOSIS — I361 Nonrheumatic tricuspid (valve) insufficiency: Secondary | ICD-10-CM | POA: Diagnosis not present

## 2020-09-03 DIAGNOSIS — I340 Nonrheumatic mitral (valve) insufficiency: Secondary | ICD-10-CM

## 2020-09-03 DIAGNOSIS — R079 Chest pain, unspecified: Secondary | ICD-10-CM | POA: Diagnosis not present

## 2020-09-03 LAB — ECHOCARDIOGRAM COMPLETE
Area-P 1/2: 3.17 cm2
Calc EF: 33.8 %
Height: 68 in
S' Lateral: 4.1 cm
Single Plane A2C EF: 35.1 %
Single Plane A4C EF: 36.8 %
Weight: 2402.13 oz

## 2020-09-03 LAB — GLUCOSE, CAPILLARY
Glucose-Capillary: 156 mg/dL — ABNORMAL HIGH (ref 70–99)
Glucose-Capillary: 138 mg/dL — ABNORMAL HIGH (ref 70–99)
Glucose-Capillary: 158 mg/dL — ABNORMAL HIGH (ref 70–99)
Glucose-Capillary: 160 mg/dL — ABNORMAL HIGH (ref 70–99)
Glucose-Capillary: 126 mg/dL — ABNORMAL HIGH (ref 70–99)

## 2020-09-03 MED ORDER — ALPRAZOLAM 0.25 MG PO TABS
0.2500 mg | ORAL_TABLET | Freq: Once | ORAL | Status: AC
  Administered 2020-09-03: 0.25 mg via ORAL
  Filled 2020-09-03: qty 1

## 2020-09-03 NOTE — Care Management Obs Status (Signed)
MEDICARE OBSERVATION STATUS NOTIFICATION   Patient Details  Name: LONALD TROIANI MRN: [id number] Date of Birth: [birth year]   Medicare Observation Status Notification Given:  Yes    Gala Lewandowsky, RN 09/03/2020, 3:00 PM

## 2020-09-03 NOTE — Progress Notes (Signed)
PROGRESS NOTE  Edward Suarez PPJ:[id number] DOB: 06-Jun-1932 DOA: 09/02/2020 PCP: Renford Dills, MD  Brief History   Edward Suarez is a 86 y.o. male with medical history significant of afib; HTN; HLD; glaucoma; DM; COPD; pacemaker placement; dementia; and stage 3 CKD presenting with chest pain.  He complained of neck, shoulder, and chest pain yesterday.  His wife thought he might have pulled a muscle and it resolved with Aleve.  This AM, he woke up early and got dressed, complaining of neck, back, and chest pain today.  It got worse and they came in.  Non-exertional pain.  Appears to be improved.     Woke up about 4AM with severe CP, recurrent.  Near syncope in radiology.  EKG is paced, interrogation negative during pre-syncope.  HEART score is 5, needs admission.  [facility] were consulted to admit the patient for further evaluation and treatment. Cardiology was consulted and the patient was seen by Dr. Wyline Mood who ordered an echocardiogram. The echocardiogram has demonstrated EF 30% with The left  ventricle has moderate to severely decreased function. There is akinesis of all mid-to-apical septal segments, mid-to-apical inferior segments, and the LV apex. The mid-to-apical inferolateral segments are severely hypokinetic. The rest of the LV segments are moderately hypokinetic. The left ventricular internal cavity  size was mildly dilated. There is mild concentric left ventricular  hypertrophy. Left ventricular diastolic parameters are consistent with Grade I diastolic dysfunction.   Consultants  . Cardiology  Procedures  . None  Antibiotics   Anti-infectives (From admission, onward)   None    .  Subjective  The patient is resting comfortably. No new complaints. His wife is at bedside.  Objective   Vitals:  Vitals:   09/03/20 0423 09/03/20 1410  BP: (!) 152/90 124/76  Pulse:  76  Resp: 18 18  Temp: 98.7 F (37.1 C) 98.4 F (36.9 C)  SpO2: 95% 97%    Exam:  Constitutional:  . The patient is awake and alert. No acute distress. Respiratory:  . No increased work of breathing. . No wheezes, rales, or rhonchi . No tactile fremitus Cardiovascular:  . Regular rate and rhythm . No murmurs, ectopy, or gallups. . No lateral PMI. No thrills. Abdomen:  . Abdomen is soft, non-tender, non-distended . No hernias, masses, or organomegaly . Normoactive bowel sounds.  Musculoskeletal:  . No cyanosis, clubbing, or edema Skin:  . No rashes, lesions, ulcers . palpation of skin: no induration or nodules Neurologic:  . CN 2-12 intact . Sensation all 4 extremities intact Psychiatric:  . Mental status o Mood, affect appropriate o Orientation to person, place, time  . judgment and insight appear intact  I have personally reviewed the following:   Today's Data  . Vitals  Imaging  . 2-view CXR  Cardiology Data  . EKG . Echocardiogram  Scheduled Meds: . amLODipine  5 mg Oral Daily  . apixaban  2.5 mg Oral BID  . atorvastatin  80 mg Oral QPM  . donepezil  10 mg Oral q AM  . insulin aspart  0-15 Units Subcutaneous TID WC  . levothyroxine  50 mcg Oral QAC breakfast  . memantine  10 mg Oral BID  . 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 Initial cardiac HS  troponin negative and negative repeat/Delta.EKG not indicative of acute ischemia.  HEART pathway score is 6, indicating that the patient has an elevated risk score and requires further evaluation. Cardiology has been consulted and they have ordered an echocardiogram that demonstrated  EF 30% . The left ventricle has moderate to severely decreased function. There is akinesis of all mid-to-apical septal segments, mid-to-apical inferior segments, and the LV apex. The mid-to-apical inferolateral segments are severely hypokinetic. The rest of the LV segments are moderately hypokinetic. The left ventricular internal cavity  size was mildly dilated. There is mild concentric left ventricular  hypertrophy. Left ventricular diastolic parameters are consistent with Grade I diastolic dysfunction. Cardiology is following. [name] Kitchen  HTN: Continue Noravsc with as needed hydralazine.   HLD: Continue Lipitor  DMII: Hold Actos, Glucotrol. The patient's sugars are being covered with moderate-scale SSI for now. Consider alternate agent rather than sulfonylurea due to risk of hypoglycemia. Tight glycemic control is not essential at this time  Dementia: Moderate to severe based on today's evaluation. Continue Aricept, Namenda. Wife is still able to care for him in independent living.  Atrial Fibrillation: Rate is controlled/patient has a pacemaker. He is on eliquis for anticoagulation.  Stage 3b CKD: Creatinine is at baseline. Consider IVF hydration before procedure if [facility] is planned.   I have seen and examined this patient myself. I have spent 35 minutes in his evaluation and care.  DVT prophylaxis: Eliquis  Code Status:  DNR - confirmed with patient/family Family Communication: Wife was present throughout evaluation Disposition Plan:The patient is from: home             Anticipated d/c is to: home without [facility] services              Anticipated d/c date will depend on clinical response to treatment, but  possibly as early as tomorrow if he has excellent response to treatment             Patient is currently: acutely ill  Aleaya Latona, DO [facility] Direct contact: see [email or web address]  7PM-7AM contact night coverage as above 09/03/2020, 4:49 PM  LOS: 0 days

## 2020-09-03 NOTE — Progress Notes (Signed)
  Echocardiogram 2D Echocardiogram has been performed.  Delcie Roch 09/03/2020, 2:43 PM

## 2020-09-03 NOTE — Progress Notes (Signed)
Patient continued to be restless and wandering around room and removing telemetry monitor after melatonin given as ordered.  Telemetry order has expired, notified MD of patient removing telemetry.  One time xanax order received.  Activity apron given to patient, bed alarm on, wife at bedside.  Will continue to monitor closely.

## 2020-09-03 NOTE — Progress Notes (Signed)
Pt very restless and confused throughout the night.  Alert to self.  Melatonin ordered and patient slept approx 2 1/2 hours until he had to urinate.  Patient became persistent at raising and lowering the siderail.  Pt punched right lower side rail with right hand and obtained a skin tear.  Site cleansed and bandaged.  Sitter placed at bedside/

## 2020-09-03 NOTE — Progress Notes (Signed)
Please see initial consult note, awaiting echo results. No plans for ischemic testing given advanced dementia, very limited historian without objective evidecne of ischemia. We will f/u echo results     Dina Rich MD

## 2020-09-04 DIAGNOSIS — R079 Chest pain, unspecified: Secondary | ICD-10-CM | POA: Diagnosis not present

## 2020-09-04 LAB — CBC WITH DIFFERENTIAL/PLATELET
Abs Immature Granulocytes: 0.03 10*3/uL (ref 0.00–0.07)
Basophils Absolute: 0 10*3/uL (ref 0.0–0.1)
Basophils Relative: 0 %
Eosinophils Absolute: 0.1 10*3/uL (ref 0.0–0.5)
Eosinophils Relative: 2 %
HCT: 40.5 % (ref 39.0–52.0)
Hemoglobin: 13.1 g/dL (ref 13.0–17.0)
Immature Granulocytes: 0 %
Lymphocytes Relative: 18 %
Lymphs Abs: 1.5 10*3/uL (ref 0.7–4.0)
MCH: 26.7 pg (ref 26.0–34.0)
MCHC: 32.3 g/dL (ref 30.0–36.0)
MCV: 82.7 fL (ref 80.0–100.0)
Monocytes Absolute: 1 10*3/uL (ref 0.1–1.0)
Monocytes Relative: 12 %
Neutro Abs: 5.6 10*3/uL (ref 1.7–7.7)
Neutrophils Relative %: 68 %
Platelets: 216 10*3/uL (ref 150–400)
RBC: 4.9 MIL/uL (ref 4.22–5.81)
RDW: 17.1 % — ABNORMAL HIGH (ref 11.5–15.5)
WBC: 8.3 10*3/uL (ref 4.0–10.5)
nRBC: 0 % (ref 0.0–0.2)

## 2020-09-04 LAB — BASIC METABOLIC PANEL
Anion gap: 11 (ref 5–15)
BUN: 15 mg/dL (ref 8–23)
CO2: 22 mmol/L (ref 22–32)
Calcium: 8.9 mg/dL (ref 8.9–10.3)
Chloride: 106 mmol/L (ref 98–111)
Creatinine, Ser: 1.4 mg/dL — ABNORMAL HIGH (ref 0.61–1.24)
GFR, Estimated: 49 mL/min — ABNORMAL LOW (ref >60)
Glucose, Bld: 149 mg/dL — ABNORMAL HIGH (ref 70–99)
Potassium: 3.5 mmol/L (ref 3.5–5.1)
Sodium: 139 mmol/L (ref 135–145)

## 2020-09-04 LAB — GLUCOSE, CAPILLARY
Glucose-Capillary: 143 mg/dL — ABNORMAL HIGH (ref 70–99)
Glucose-Capillary: 199 mg/dL — ABNORMAL HIGH (ref 70–99)

## 2020-09-04 MED ORDER — NITROGLYCERIN 0.4 MG SL SUBL: 0.4000 mg | SUBLINGUAL_TABLET | SUBLINGUAL | 0 refills | Status: DC | PRN

## 2020-09-04 NOTE — Progress Notes (Addendum)
Progress Note  Patient Name: Edward Suarez Date of Encounter: 09/04/2020  Primary Cardiologist: Armanda Magic, MD   Subjective   Discussed the patient's care at the bedside with patient's wife present.  He appears restless in the bed.  Denies pain.  Not currently on telemetry.  Inpatient Medications    Scheduled Meds: . amLODipine  5 mg Oral Daily  . apixaban  2.5 mg Oral BID  . atorvastatin  80 mg Oral QPM  . donepezil  10 mg Oral q AM  . insulin aspart  0-15 Units Subcutaneous TID WC  . levothyroxine  50 mcg Oral QAC breakfast  . memantine  10 mg Oral BID  . pneumococcal 23 valent vaccine  0.5 mL Intramuscular Tomorrow-1000   Continuous Infusions:  PRN Meds: acetaminophen, melatonin, nitroGLYCERIN, ondansetron (ZOFRAN) IV   Vital Signs    Vitals:   09/03/20 0423 09/03/20 1410 09/03/20 1936 09/04/20 0752  BP: (!) 152/90 124/76  (!) 142/67  Pulse:  76 78 71  Resp: 18 18 16 16   Temp: 98.7 F (37.1 C) 98.4 F (36.9 C) 98.7 F (37.1 C) 98.1 F (36.7 C)  TempSrc: Oral Oral Oral Oral  SpO2: 95% 97% 98% 96%  Weight: 68.1 kg     Height:       No intake or output data in the 24 hours ending 09/04/20 0939 Filed Weights   09/02/20 0634 09/03/20 0423  Weight: 70.5 kg 68.1 kg    Telemetry    None available to review, patient not on telemetry- Personally Reviewed  ECG    AV paced rhythm- Personally Reviewed  Physical Exam   GEN: restless Neck: No JVD Cardiac: regular rhythm, normal rate, no murmurs, rubs, or gallops.  Respiratory: Clear to auscultation bilaterally. GI: Soft, nontender, non-distended  MS: No edema; No deformity. Neuro:  Nonfocal  Psych: appears confused, pleasant  Labs    Chemistry Recent Labs  Lab 09/02/20 0635 09/04/20 0746  NA 140 139  K 3.9 3.5  CL 105 106  CO2 23 22  GLUCOSE 182* 149*  BUN 28* 15  CREATININE 1.63* 1.40*  CALCIUM 9.4 8.9  GFRNONAA 41* 49*  ANIONGAP 12 11     Hematology Recent Labs  Lab  09/02/20 0635 09/04/20 0746  WBC 9.9 8.3  RBC 5.17 4.90  HGB 13.7 13.1  HCT 43.7 40.5  MCV 84.5 82.7  MCH 26.5 26.7  MCHC 31.4 32.3  RDW 17.5* 17.1*  PLT 249 216    Cardiac EnzymesNo results for input(s): TROPONINI in the last 168 hours. No results for input(s): TROPIPOC in the last 168 hours.   BNPNo results for input(s): BNP, PROBNP in the last 168 hours.   DDimer No results for input(s): DDIMER in the last 168 hours.   Radiology    ECHOCARDIOGRAM COMPLETE  Result Date: 09/03/2020    ECHOCARDIOGRAM REPORT   Patient Name:   Edward Suarez Date of Exam: 09/03/2020 Medical Rec #:  09/05/2020       Height:       68.0 in Accession #:    [id number]      Weight:       150.1 lb Date of Birth:  17-Aug-1932       BSA:          1.809 m Patient Age:    86 years        BP:           152/90 mmHg Patient Gender: M  HR:           72 bpm. Exam Location:  Inpatient Procedure: 2D Echo Indications:    chest pain  History:        Patient has prior history of Echocardiogram examinations, most                 recent 04/30/2013. Pacemaker, Arrythmias:Paroxysmal A-Fib; Risk                 Factors:Hypertension, Dyslipidemia and Diabetes.  Sonographer:    Delcie Roch Referring Phys: [id number] Dorothe Pea BRANCH IMPRESSIONS  1. Left ventricular ejection fraction, by estimation, is 30%. The left ventricle has moderate to severely decreased function. There is akinesis of all mid-to-apical septal segments, mid-to-apical inferior segments, and the LV apex. The mid-to-apical inferolateral segments are severely hypokinetic. The rest of the LV segments are moderately hypokinetic. The left ventricular internal cavity size was mildly dilated. There is mild concentric left ventricular hypertrophy. Left ventricular diastolic parameters are consistent with Grade I diastolic dysfunction (impaired relaxation).  2. Right ventricular systolic function is normal. The right ventricular size is normal.  3. Left atrial  size was moderately dilated.  4. The mitral valve is abnormal. Mild mitral valve regurgitation.  5. The aortic valve is tricuspid. There is mild calcification of the aortic valve. There is mild thickening of the aortic valve. Aortic valve regurgitation is trivial.  6. The inferior vena cava is normal in size with greater than 50% respiratory variability, suggesting right atrial pressure of 3 mmHg. Comparison(s): Compared to prior echo report in 2014, the LVEF is now moderately-to-severely reduced with regional wall motion abnormalities as detailed above. FINDINGS  Left Ventricle: Left ventricular ejection fraction, by estimation, is 30%. The left ventricle has moderate to severely decreased function. The left ventricle demonstrates regional wall motion abnormalities. There is akinesis of all mid-to-apical septal segments, mid-to-apical inferior segments, and the LV apex. The mid-to-apical inferolateral segments are severely hypokinetic. The rest of the LV segments are moderately hypokinetic. The left ventricular internal cavity size was mildly dilated. There is mild concentric left ventricular hypertrophy. Abnormal (paradoxical) septal motion, consistent with RV pacemaker. Left ventricular diastolic parameters are consistent with Grade I diastolic dysfunction (impaired relaxation). Right Ventricle: The right ventricular size is normal. Right vetricular wall thickness was not well visualized. Right ventricular systolic function is normal. Left Atrium: Left atrial size was moderately dilated. Right Atrium: Right atrial size was normal in size. Pericardium: There is no evidence of pericardial effusion. Mitral Valve: The mitral valve is abnormal. There is mild thickening of the mitral valve leaflet(s). Mild mitral annular calcification. Mild mitral valve regurgitation. Tricuspid Valve: The tricuspid valve is normal in structure. Tricuspid valve regurgitation is mild. Aortic Valve: The aortic valve is tricuspid. There is  mild calcification of the aortic valve. There is mild thickening of the aortic valve. Aortic valve regurgitation is trivial. Pulmonic Valve: The pulmonic valve was normal in structure. Pulmonic valve regurgitation is trivial. Aorta: The aortic root is normal in size and structure. Venous: The inferior vena cava is normal in size with greater than 50% respiratory variability, suggesting right atrial pressure of 3 mmHg. IAS/Shunts: No atrial level shunt detected by color flow Doppler. Additional Comments: A pacer wire is visualized.  LEFT VENTRICLE PLAX 2D LVIDd:         5.50 cm      Diastology LVIDs:         4.10 cm      LV e' medial:  4.79 cm/s LV PW:         1.10 cm      LV E/e' medial:  19.5 LV IVS:        1.00 cm      LV e' lateral:   7.18 cm/s LVOT diam:     1.60 cm      LV E/e' lateral: 13.0 LV SV:         45 LV SV Index:   25 LVOT Area:     2.01 cm  LV Volumes (MOD) LV vol d, MOD A2C: 110.0 ml LV vol d, MOD A4C: 107.0 ml LV vol s, MOD A2C: 71.4 ml LV vol s, MOD A4C: 67.6 ml LV SV MOD A2C:     38.6 ml LV SV MOD A4C:     107.0 ml LV SV MOD BP:      37.1 ml RIGHT VENTRICLE             IVC RV S prime:     13.70 cm/s  IVC diam: 1.80 cm TAPSE (M-mode): 2.1 cm LEFT ATRIUM             Index       RIGHT ATRIUM           Index LA diam:        4.30 cm 2.38 cm/m  RA Area:     12.30 cm LA Vol (A2C):   73.3 ml 40.51 ml/m RA Volume:   27.90 ml  15.42 ml/m LA Vol (A4C):   61.9 ml 34.21 ml/m LA Biplane Vol: 70.5 ml 38.97 ml/m  AORTIC VALVE LVOT Vmax:   90.00 cm/s LVOT Vmean:  64.300 cm/s LVOT VTI:    0.226 m  AORTA Ao Root diam: 3.10 cm Ao Asc diam:  2.90 cm MITRAL VALVE MV Area (PHT): 3.17 cm    SHUNTS MV Decel Time: 239 msec    Systemic VTI:  0.23 m MV E velocity: 93.50 cm/s  Systemic Diam: 1.60 cm MV A velocity: 98.50 cm/s MV E/A ratio:  0.95 Laurance FlattenHeather Pemberton MD Electronically signed by Laurance FlattenHeather Pemberton MD Signature Date/Time: 09/03/2020/3:11:43 PM    Final     Cardiac Studies    Patient Profile      Edward Suarez is a 86 y.o. male with a hx of HTN, DM2, HL, complete heart block with pacemaker, mild carotid artery stenosis, PAF, CKD 3,  who is being seen today for the evaluation of chest pain and known reduced EF, which may have worsened since 2014.    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  Current echocardiogram suggested EF is around 30% with akinesis of the mid to apical septum inferior wall and apex, and severe hypokinesis of the inferolateral wall.  Review of prior echocardiogram report suggest there was apical hypokinesis with an EF of 45 to 50% prior.  There is abnormal septal motion with RV pacing on yesterday's exam.  With normal cardiac enzymes, it is unclear whether there has truly been a significant change in LVEF, or if  there is variability in interpretation of the EF and abnormal septal motion with pacing in combination with apical hypokinesis.  Overall the patient is not an optimal candidate for coronary angiography at this time given confusion and restlessness.  I have discussed his care in great detail with his wife at the bedside.  We participated in shared decision making and determined that outpatient follow-up would be reasonable to discuss this further.  He is not on aspirin given his DOAC for PAF.  Would continue DOAC at this time.  Would also continue atorvastatin 80 mg daily as noted in Dr. Norris Cross note from April 2020.  I discussed with the wife a prescription for  sublingual nitroglycerin to manage chest pain at home.  She favors this idea and we have discussed in detail administration of nitroglycerin and when to contact EMS.  Follow-up as an arranged with Dr. Mayford Knife on 09/26/2020.  Patient's wife would like to take him home today, this seems reasonable from a cardiovascular standpoint.  Recommendations discussed with Dr. Gerri Lins.  Total time of encounter: 40 minutes total time of encounter, including 25 minutes spent in face-to-face patient care on the date of this encounter. This time includes coordination of care and counseling regarding above mentioned problem list. Remainder of non-face-to-face time involved reviewing chart documents/testing relevant to the patient encounter and documentation in the medical record.   [facility] will sign off.    For questions or updates, please contact [facility] Please consult [email or web address] for contact info under        Signed, Parke Poisson, MD  09/04/2020, 9:39 AM

## 2020-09-04 NOTE — Discharge Summary (Signed)
Physician Discharge Summary  Edward Suarez ZOX:[id number] DOB: 10/09/1931 DOA: 09/02/2020  PCP: Edward Dills, MD  Admit date: 09/02/2020 Discharge date: 09/04/2020  Recommendations for Outpatient Follow-up:  1. Discharge to home 2. Follow up with PCP in 7-10 days. Chemistry to be drawn upon this visit and reported to PCP. 3. Follow up with Dr. Mayford Suarez in his officee on 09/27/2019 at 10;20 am.  Discharge Diagnoses: Principal diagnosis is #1 1. Chest pain 2. Acute on chronic systolic CHF 3. Hyperlipidemia 4. CAD - chronic 5. DM II 6.  Dementia 7. Atrial fibrillation 8. CKD IIIb 9. Dehydration  Discharge Condition: Fair  Disposition: Home  Diet recommendation: heart healthy/carbohydrate modified  Filed Weights   09/02/20 0634 09/03/20 0423  Weight: 70.5 kg 68.1 kg    History of present illness: Edward Suarez is a 86 y.o. male with medical history significant of afib; HTN; HLD; glaucoma; DM; COPD; pacemaker placement; dementia; and stage 3 CKD presenting with chest pain.  He complained of neck, shoulder, and chest pain yesterday.  His wife thought he might have pulled a muscle and it resolved with Aleve.  This AM, he woke up early and got dressed, complaining of neck, back, and chest pain today.  It got worse and they came in.  Non-exertional pain.  Appears to be improved.   ED Course:   Woke up about 4AM with severe CP, recurrent.  Near syncope in radiology.  EKG is paced, interrogation negative during pre-syncope.  HEART score is 5, needs observation.  Hospital Course: Edward Suarez a 86 y.o.malewith medical history significant ofafib; HTN; HLD; glaucoma; DM; COPD; pacemaker placement; dementia; and stage 3 CKD presenting with chest pain.He complained of neck, shoulder, and chest pain yesterday. His wife thought he might have pulled a muscle and it resolved with Aleve. This AM, he woke up early and got dressed, complaining of neck, back, and chest pain today. It  got worse and they came in. Non-exertional pain. Appears to be improved.   Woke up about 4AM with severe CP, recurrent. Near syncope in radiology. EKG is paced, interrogation negative during pre-syncope. HEART score is 5, needs admission.  [facility] were consulted to admit the patient for further evaluation and treatment. Cardiology was consulted and the patient was seen by Edward Suarez who ordered an echocardiogram. The echocardiogram has demonstrated EF 30% with The left  ventricle has moderate to severely decreased function. There is akinesis of all mid-to-apical septal segments, mid-to-apical inferior segments, and the LV apex. The mid-to-apical inferolateral segments are severely hypokinetic. The rest of the LV segments are moderately hypokinetic. The left ventricular internal cavity  size was mildly dilated. There is mild concentric left ventricular  hypertrophy. Left ventricular diastolic parameters are consistent with Grade I diastolic dysfunction.   The patient was kept overnight and was re-evaluated by cardiology in the morning. Edward Suarez saw the patient and discussed the abnormal echocardiogram findings with the patient's wife who makes decisions for him. The decision was made not to perform LHC as inpatient and to pursue the issue further as outpatient. Edward Suarez recommended continuation of the patient's DOAC, atorvastatin 80 mg daily, and addition of sublingual nitroglycerin.  The patient will be discharged to home today and follow up with Dr. Mayford Suarez as outpatient.  Today's assessment: S: The patient is resting comfortably. No new complaints. O: Vitals:  Vitals:   09/03/20 1936 09/04/20 0752  BP:  (!) 142/67  Pulse: 78 71  Resp: 16 16  Temp:  98.7 F (37.1 C) 98.1 F (36.7 C)  SpO2: 98% 96%   Exam:  Constitutional:  . The patient is awake, alert, and oriented x 3. No acute distress. Respiratory:  . No increased work of breathing. . No wheezes, rales, or  rhonchi . No tactile fremitus Cardiovascular:  . Regular rate and rhythm . No murmurs, ectopy, or gallups. . No lateral PMI. No thrills. Abdomen:  . Abdomen is soft, non-tender, non-distended . No hernias, masses, or organomegaly . Normoactive bowel sounds.  Musculoskeletal:  . No cyanosis, clubbing, or edema Skin:  . No rashes, lesions, ulcers . palpation of skin: no induration or nodules Neurologic:  . CN 2-12 intact . Sensation all 4 extremities intact Psychiatric:  . Mental status o Suarez, affect appropriate o Orientation to person, place, time  . judgment and insight appear intact  Discharge Instructions  Discharge Instructions    Activity as tolerated - No restrictions   Complete by: As directed    Call MD for:  difficulty breathing, headache or visual disturbances   Complete by: As directed    Call MD for:  severe uncontrolled pain   Complete by: As directed    Diet - low sodium heart healthy   Complete by: As directed    Discharge instructions   Complete by: As directed    Discharge to home Follow up with PCP in 7-10 days. Chemistry to be drawn upon this visit and reported to PCP. Follow up with Dr. Mayford Suarez in his officee on 09/27/2019 at 10;20 am.   Increase activity slowly   Complete by: As directed      Allergies as of 09/04/2020      Reactions   Penicillins Rash      Medication List    TAKE these medications   amLODipine 5 MG tablet Commonly known as: NORVASC TAKE 1 TABLET BY MOUTH EVERY DAY   apixaban 2.5 MG Tabs tablet Commonly known as: ELIQUIS Take 1 tablet (2.5 mg total) by mouth 2 (two) times daily.   atorvastatin 80 MG tablet Commonly known as: LIPITOR TAKE 1 TABLET (80 MG TOTAL) BY MOUTH EVERY EVENING.   CALCIUM 1000 + D PO Take by mouth. OTC   cetirizine 10 MG tablet Commonly known as: ZYRTEC Take 10 mg by mouth daily. For allergies. Takes prn   donepezil 10 MG tablet Commonly known as: ARICEPT TAKE ONE TABLET BY MOUTH EVERY  EVENING What changed: when to take this   fluticasone 50 MCG/ACT nasal spray Commonly known as: FLONASE Place 2 sprays into both nostrils daily as needed for allergies or rhinitis.   glipiZIDE 5 MG tablet Commonly known as: GLUCOTROL Take 5 mg by mouth daily before breakfast.   levothyroxine 50 MCG tablet Commonly known as: SYNTHROID Take 50 mcg by mouth daily before breakfast.   memantine 10 MG tablet Commonly known as: Namenda Take 1 tablet (10 mg total) by mouth 2 (two) times daily.   nitroGLYCERIN 0.4 MG SL tablet Commonly known as: NITROSTAT Place 1 tablet (0.4 mg total) under the tongue every 5 (five) minutes as needed for chest pain.   pioglitazone 45 MG tablet Commonly known as: ACTOS Take 45 mg by mouth daily.      Allergies  Allergen Reactions  . Penicillins Rash    The results of significant diagnostics from this hospitalization (including imaging, microbiology, ancillary and laboratory) are listed below for reference.    Significant Diagnostic Studies: DG Chest 2 View  Result Date: 09/02/2020 CLINICAL DATA:  Acute onset chest pain and nausea beginning this morning. EXAM: CHEST - 2 VIEW COMPARISON:  03/11/2017 FINDINGS: The heart size and mediastinal contours are within normal limits. Dual lead transvenous pacemaker remains in appropriate position. Aortic atherosclerotic calcification noted. Both lungs are clear. Multiple old right rib fracture deformities again seen. IMPRESSION: Stable exam.  No active cardiopulmonary disease. Electronically Signed   By: Danae Orleans M.D.   On: 09/02/2020 07:42   ECHOCARDIOGRAM COMPLETE  Result Date: 09/03/2020    ECHOCARDIOGRAM REPORT   Patient Name:   KEEFER SOULLIERE Date of Exam: 09/03/2020 Medical Rec #:  [id number]       Height:       68.0 in Accession #:    [id number]      Weight:       150.1 lb Date of Birth:  1932-06-05       BSA:          1.809 m Patient Age:    86 years        BP:           152/90 mmHg Patient Gender:  M               HR:           72 bpm. Exam Location:  Inpatient Procedure: 2D Echo Indications:    chest pain  History:        Patient has prior history of Echocardiogram examinations, most                 recent 04/30/2013. Pacemaker, Arrythmias:Paroxysmal A-Fib; Risk                 Factors:Hypertension, Dyslipidemia and Diabetes.  Sonographer:    Delcie Roch Referring Phys: [id number] Dorothe Pea BRANCH IMPRESSIONS  1. Left ventricular ejection fraction, by estimation, is 30%. The left ventricle has moderate to severely decreased function. There is akinesis of all mid-to-apical septal segments, mid-to-apical inferior segments, and the LV apex. The mid-to-apical inferolateral segments are severely hypokinetic. The rest of the LV segments are moderately hypokinetic. The left ventricular internal cavity size was mildly dilated. There is mild concentric left ventricular hypertrophy. Left ventricular diastolic parameters are consistent with Grade I diastolic dysfunction (impaired relaxation).  2. Right ventricular systolic function is normal. The right ventricular size is normal.  3. Left atrial size was moderately dilated.  4. The mitral valve is abnormal. Mild mitral valve regurgitation.  5. The aortic valve is tricuspid. There is mild calcification of the aortic valve. There is mild thickening of the aortic valve. Aortic valve regurgitation is trivial.  6. The inferior vena cava is normal in size with greater than 50% respiratory variability, suggesting right atrial pressure of 3 mmHg. Comparison(s): Compared to prior echo report in 2014, the LVEF is now moderately-to-severely reduced with regional wall motion abnormalities as detailed above. FINDINGS  Left Ventricle: Left ventricular ejection fraction, by estimation, is 30%. The left ventricle has moderate to severely decreased function. The left ventricle demonstrates regional wall motion abnormalities. There is akinesis of all mid-to-apical septal segments,  mid-to-apical inferior segments, and the LV apex. The mid-to-apical inferolateral segments are severely hypokinetic. The rest of the LV segments are moderately hypokinetic. The left ventricular internal cavity size was mildly dilated. There is mild concentric left ventricular hypertrophy. Abnormal (paradoxical) septal motion, consistent with RV pacemaker. Left ventricular diastolic parameters are consistent with Grade I diastolic dysfunction (impaired relaxation). Right Ventricle: The right ventricular size is normal. Right vetricular  wall thickness was not well visualized. Right ventricular systolic function is normal. Left Atrium: Left atrial size was moderately dilated. Right Atrium: Right atrial size was normal in size. Pericardium: There is no evidence of pericardial effusion. Mitral Valve: The mitral valve is abnormal. There is mild thickening of the mitral valve leaflet(s). Mild mitral annular calcification. Mild mitral valve regurgitation. Tricuspid Valve: The tricuspid valve is normal in structure. Tricuspid valve regurgitation is mild. Aortic Valve: The aortic valve is tricuspid. There is mild calcification of the aortic valve. There is mild thickening of the aortic valve. Aortic valve regurgitation is trivial. Pulmonic Valve: The pulmonic valve was normal in structure. Pulmonic valve regurgitation is trivial. Aorta: The aortic root is normal in size and structure. Venous: The inferior vena cava is normal in size with greater than 50% respiratory variability, suggesting right atrial pressure of 3 mmHg. IAS/Shunts: No atrial level shunt detected by color flow Doppler. Additional Comments: A pacer wire is visualized.  LEFT VENTRICLE PLAX 2D LVIDd:         5.50 cm      Diastology LVIDs:         4.10 cm      LV e' medial:    4.79 cm/s LV PW:         1.10 cm      LV E/e' medial:  19.5 LV IVS:        1.00 cm      LV e' lateral:   7.18 cm/s LVOT diam:     1.60 cm      LV E/e' lateral: 13.0 LV SV:         45 LV SV  Index:   25 LVOT Area:     2.01 cm  LV Volumes (MOD) LV vol d, MOD A2C: 110.0 ml LV vol d, MOD A4C: 107.0 ml LV vol s, MOD A2C: 71.4 ml LV vol s, MOD A4C: 67.6 ml LV SV MOD A2C:     38.6 ml LV SV MOD A4C:     107.0 ml LV SV MOD BP:      37.1 ml RIGHT VENTRICLE             IVC RV S prime:     13.70 cm/s  IVC diam: 1.80 cm TAPSE (M-mode): 2.1 cm LEFT ATRIUM             Index       RIGHT ATRIUM           Index LA diam:        4.30 cm 2.38 cm/m  RA Area:     12.30 cm LA Vol (A2C):   73.3 ml 40.51 ml/m RA Volume:   27.90 ml  15.42 ml/m LA Vol (A4C):   61.9 ml 34.21 ml/m LA Biplane Vol: 70.5 ml 38.97 ml/m  AORTIC VALVE LVOT Vmax:   90.00 cm/s LVOT Vmean:  64.300 cm/s LVOT VTI:    0.226 m  AORTA Ao Root diam: 3.10 cm Ao Asc diam:  2.90 cm MITRAL VALVE MV Area (PHT): 3.17 cm    SHUNTS MV Decel Time: 239 msec    Systemic VTI:  0.23 m MV E velocity: 93.50 cm/s  Systemic Diam: 1.60 cm MV A velocity: 98.50 cm/s MV E/A ratio:  0.95 Laurance FlattenHeather Pemberton MD Electronically signed by Laurance FlattenHeather Pemberton MD Signature Date/Time: 09/03/2020/3:11:43 PM    Final     Microbiology: Recent Results (from the past 240 hour(s))  Resp Panel by RT-PCR (Flu A&B,  Covid) Nasopharyngeal Swab     Status: None   Collection Time: 09/02/20 10:43 AM   Specimen: Nasopharyngeal Swab; Nasopharyngeal(NP) swabs in vial transport medium  Result Value Ref Range Status   SARS Coronavirus 2 by RT PCR NEGATIVE NEGATIVE Final    Comment: (NOTE) SARS-CoV-2 target nucleic acids are NOT DETECTED.  The SARS-CoV-2 RNA is generally detectable in upper respiratory specimens during the acute phase of infection. The lowest concentration of SARS-CoV-2 viral copies this assay can detect is 138 copies/mL. A negative result does not preclude SARS-Cov-2 infection and should not be used as the sole basis for treatment or other patient management decisions. A negative result may occur with  improper specimen collection/handling, submission of specimen  other than nasopharyngeal swab, presence of viral mutation(s) within the areas targeted by this assay, and inadequate number of viral copies(<138 copies/mL). A negative result must be combined with clinical observations, patient history, and epidemiological information. The expected result is Negative.  Fact Sheet for Patients:  BloggerCourse.com  Fact Sheet for Healthcare Providers:  SeriousBroker.it  This test is no t yet approved or cleared by the [place] FDA and  has been authorized for detection and/or diagnosis of SARS-CoV-2 by FDA under an Emergency Use Authorization (EUA). This EUA will remain  in effect (meaning this test can be used) for the duration of the COVID-19 declaration under Section 564(b)(1) of the Act, 21 U.S.C.section 360bbb-3(b)(1), unless the authorization is terminated  or revoked sooner.       Influenza A by PCR NEGATIVE NEGATIVE Final   Influenza B by PCR NEGATIVE NEGATIVE Final    Comment: (NOTE) The Xpert Xpress SARS-CoV-2/FLU/RSV plus assay is intended as an aid in the diagnosis of influenza from Nasopharyngeal swab specimens and should not be used as a sole basis for treatment. Nasal washings and aspirates are unacceptable for Xpert Xpress SARS-CoV-2/FLU/RSV testing.  Fact Sheet for Patients: BloggerCourse.com  Fact Sheet for Healthcare Providers: SeriousBroker.it  This test is not yet approved or cleared by the [place] FDA and has been authorized for detection and/or diagnosis of SARS-CoV-2 by FDA under an Emergency Use Authorization (EUA). This EUA will remain in effect (meaning this test can be used) for the duration of the COVID-19 declaration under Section 564(b)(1) of the Act, 21 U.S.C. section 360bbb-3(b)(1), unless the authorization is terminated or revoked.  Performed at [facility], [address], [place],  [place] [address]      Labs: Basic Metabolic Panel: Recent Labs  Lab 09/02/20 0635 09/04/20 0746  NA 140 139  K 3.9 3.5  CL 105 106  CO2 23 22  GLUCOSE 182* 149*  BUN 28* 15  CREATININE 1.63* 1.40*  CALCIUM 9.4 8.9   Liver Function Tests: No results for input(s): AST, ALT, ALKPHOS, BILITOT, PROT, ALBUMIN in the last 168 hours. No results for input(s): LIPASE, AMYLASE in the last 168 hours. No results for input(s): AMMONIA in the last 168 hours. CBC: Recent Labs  Lab 09/02/20 0635 09/04/20 0746  WBC 9.9 8.3  NEUTROABS  --  5.6  HGB 13.7 13.1  HCT 43.7 40.5  MCV 84.5 82.7  PLT 249 216   Cardiac Enzymes: No results for input(s): CKTOTAL, CKMB, CKMBINDEX, TROPONINI in the last 168 hours. BNP: BNP (last 3 results) No results for input(s): BNP in the last 8760 hours.  ProBNP (last 3 results) No results for input(s): PROBNP in the last 8760 hours.  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  Signed:        Gavrielle Streck, DO [facility]  09/04/2020, 5:30 PM

## 2020-09-12 ENCOUNTER — Ambulatory Visit (INDEPENDENT_AMBULATORY_CARE_PROVIDER_SITE_OTHER): Payer: PPO

## 2020-09-12 DIAGNOSIS — I495 Sick sinus syndrome: Secondary | ICD-10-CM

## 2020-09-13 LAB — CUP PACEART REMOTE DEVICE CHECK
Battery Impedance: 2183 Ohm
Battery Remaining Longevity: 16 mo
Battery Voltage: 2.71 V
Brady Statistic AP VP Percent: 95 %
Brady Statistic AP VS Percent: 0 %
Brady Statistic AS VP Percent: 5 %
Brady Statistic AS VS Percent: 0 %
Date Time Interrogation Session: 20211229102123
Implantable Lead Implant Date: 20050509
Implantable Lead Implant Date: 20050509
Implantable Lead Location: 753859
Implantable Lead Location: 753860
Implantable Lead Model: 5076
Implantable Lead Model: 5092
Implantable Pulse Generator Implant Date: 20130801
Lead Channel Impedance Value: 395 Ohm
Lead Channel Impedance Value: 531 Ohm
Lead Channel Pacing Threshold Amplitude: 0.625 V
Lead Channel Pacing Threshold Amplitude: 1.625 V
Lead Channel Pacing Threshold Pulse Width: 0.4 ms
Lead Channel Pacing Threshold Pulse Width: 0.4 ms
Lead Channel Setting Pacing Amplitude: 2 V
Lead Channel Setting Pacing Amplitude: 4 V
Lead Channel Setting Pacing Pulse Width: 0.4 ms
Lead Channel Setting Sensing Sensitivity: 4 mV

## 2020-09-26 ENCOUNTER — Other Ambulatory Visit: Payer: Self-pay

## 2020-09-26 ENCOUNTER — Ambulatory Visit: Payer: PPO | Admitting: Cardiology

## 2020-09-26 ENCOUNTER — Encounter: Payer: Self-pay | Admitting: Cardiology

## 2020-09-26 VITALS — BP 138/58 | HR 77 | Ht 68.0 in | Wt 151.6 lb

## 2020-09-26 DIAGNOSIS — N1831 Chronic kidney disease, stage 3a: Secondary | ICD-10-CM

## 2020-09-26 DIAGNOSIS — I6523 Occlusion and stenosis of bilateral carotid arteries: Secondary | ICD-10-CM

## 2020-09-26 DIAGNOSIS — R079 Chest pain, unspecified: Secondary | ICD-10-CM | POA: Diagnosis not present

## 2020-09-26 DIAGNOSIS — E7800 Pure hypercholesterolemia, unspecified: Secondary | ICD-10-CM

## 2020-09-26 DIAGNOSIS — R072 Precordial pain: Secondary | ICD-10-CM

## 2020-09-26 DIAGNOSIS — I480 Paroxysmal atrial fibrillation: Secondary | ICD-10-CM | POA: Diagnosis not present

## 2020-09-26 DIAGNOSIS — I10 Essential (primary) hypertension: Secondary | ICD-10-CM | POA: Diagnosis not present

## 2020-09-26 DIAGNOSIS — I442 Atrioventricular block, complete: Secondary | ICD-10-CM | POA: Diagnosis not present

## 2020-09-26 MED ORDER — HYDRALAZINE HCL 10 MG PO TABS: 10.0000 mg | ORAL_TABLET | Freq: Three times a day (TID) | ORAL | 3 refills | Status: DC

## 2020-09-26 MED ORDER — ISOSORBIDE MONONITRATE ER 30 MG PO TB24: 15.0000 mg | ORAL_TABLET | Freq: Every day | ORAL | 3 refills | Status: DC

## 2020-09-26 NOTE — H&P (View-Only) (Signed)
Date:  09/26/2020   ID:  Edward Suarez, DOB 1932-09-05, MRN [id number]  Patient Location:  Home  Provider location:   [place]  PCP:  Renford Dills, MD  Cardiologist:  Armanda Magic, MD  Electrophysiologist:  None   Chief Complaint:  HTN, PAF, Hyperlipidemia and carotid stenosis  History of Present Illness:    Edward Suarez is a 87 y.o. male with a hx of HTN, DM, dyslipidemia,complete HB s/p PPM and PVC's,mild (1-39% bilateral)carotid artery stenosis and is followed by Dr. Darrick Penna.He also has PAF noted on pacer check in 2016 and due to [facility] score of 5, was started on Eliquis.   He was recently hospitalized in Dec with chest pain.  EKG showed paced rhythm and while in radiology had a near syncopal episode with no arrhythmia on pacer interrogation.  Cardiology was consulted and 2D echo showed moderate LV dysfunction with EF 30% with AK of all mid to apical segments and LV apex.  After discussion with patient he did not want any further inpt evaluation and was discharged home with plan to followup in office to discuss ischemic workup given normal hsTrop during hospital stay.  It was felt that RV pacing with abnormal septal motion may be exaggerating the degree of LV dysfunction.  Due to his advanced age, confusion and restlessness in hospital, he was felt not to be an ideal candidate for cath.    He is here today for followup and is doing well.  he denies any chest pain or pressure, SOB, DOE, PND, orthopnea, LE edema, dizziness, palpitations or syncope. He is compliant with his meds and is tolerating meds with no SE.    Prior CV studies:   The following studies were reviewed today:  none  Past Medical History:  Diagnosis Date  . Allergic rhinitis, cause unspecified   . BPH (benign prostatic hyperplasia)   . Carotid artery stenosis, asymptomatic    1-39% by dopplers 09/2016 followed by Dr. Darrick Penna  . Cataracts, bilateral   . Chronic kidney disease (CKD), stage III  (moderate) (HCC)   . Complete heart block (HCC) 01/23/04   s/p Medtronic PPM implanted by Dr Amil Amen  . COPD (chronic obstructive pulmonary disease) (HCC)   . Diabetes mellitus   . Glaucoma   . Hyperlipidemia   . Hypertension   . 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 Kidney stones   . PAF (paroxysmal atrial fibrillation) (HCC) 10/09/2014   Noted on pacer check with 88 mode switches and longest episode >1 hour.  Now on Apixiban for CHADS2VASC score of 5   Past Surgical History:  Procedure Laterality Date  . PACEMAKER INSERTION  2005, 04/16/12   MDT implanted by Dr Amil Amen with generator chnage (MDT Adapta L) by Dr Johney Frame 04/16/12  . PERMANENT PACEMAKER GENERATOR CHANGE N/A 04/16/2012   Procedure: PERMANENT PACEMAKER GENERATOR CHANGE;  Surgeon: Hillis Range, MD;  Location: [facility] CATH LAB;  Service: Cardiovascular;  Laterality: N/A;     Current Meds  Medication Sig  . amLODipine (NORVASC) 5 MG tablet TAKE 1 TABLET BY MOUTH EVERY DAY (Patient taking differently: Take 5 mg by mouth daily.)  . apixaban (ELIQUIS) 2.5 MG TABS tablet Take 1 tablet (2.5 mg total) by mouth 2 (two) times daily.  [name] Kitchen atorvastatin (LIPITOR) 80 MG tablet TAKE 1 TABLET (80 MG TOTAL) BY MOUTH EVERY EVENING.  . Calcium Carb-Cholecalciferol (CALCIUM 1000 + D PO) Take by mouth. OTC  . cetirizine (ZYRTEC) 10 MG tablet Take 10 mg by  mouth daily. For allergies. Takes prn  . donepezil (ARICEPT) 10 MG tablet TAKE ONE TABLET BY MOUTH EVERY EVENING (Patient taking differently: Take 10 mg by mouth in the morning.)  . fluticasone (FLONASE) 50 MCG/ACT nasal spray Place 2 sprays into both nostrils daily as needed for allergies or rhinitis.  [name] Kitchen glipiZIDE (GLUCOTROL) 5 MG tablet Take 5 mg by mouth daily before breakfast.  . levothyroxine (SYNTHROID, LEVOTHROID) 50 MCG tablet Take 50 mcg by mouth daily before breakfast.   . memantine (NAMENDA) 10 MG tablet Take 1 tablet (10 mg total) by mouth 2 (two) times daily.  . nitroGLYCERIN  (NITROSTAT) 0.4 MG SL tablet Place 1 tablet (0.4 mg total) under the tongue every 5 (five) minutes as needed for chest pain.  . pioglitazone (ACTOS) 45 MG tablet Take 45 mg by mouth daily.      Allergies:   Penicillins   Social History   Tobacco Use  . Smoking status: Former Smoker    Packs/day: 1.00    Years: 25.00    Pack years: 25.00    Types: Cigarettes    Quit date: 09/16/1998    Years since quitting: 22.0  . Smokeless tobacco: Never Used  Vaping Use  . Vaping Use: Never used  Substance Use Topics  . Alcohol use: Yes    Alcohol/week: 5.0 standard drinks    Types: 2 Glasses of wine, 3 Shots of liquor per week    Comment: drinks a tequila shot daily  . Drug use: No     Family Hx: The patient's family history includes Heart attack in his father; Heart disease in his father.  ROS:   Please see the history of present illness.     All other systems reviewed and are negative.   Labs/Other Tests and Data Reviewed:    Recent Labs: 09/04/2020: BUN 15; Creatinine, Ser 1.40; Hemoglobin 13.1; Platelets 216; Potassium 3.5; Sodium 139   Recent Lipid Panel Lab Results  Component Value Date/Time   CHOL 128 12/16/2016 08:22 AM   TRIG 74 12/16/2016 08:22 AM   HDL 55 12/16/2016 08:22 AM   CHOLHDL 2.3 12/16/2016 08:22 AM   CHOLHDL 2.7 07/08/2016 08:02 AM   LDLCALC 58 12/16/2016 08:22 AM    Wt Readings from Last 3 Encounters:  09/26/20 151 lb 9.6 oz (68.8 kg)  09/03/20 150 lb 2.1 oz (68.1 kg)  07/11/20 155 lb 6.4 oz (70.5 kg)     Objective:    Vital Signs:  BP (!) 138/58   Pulse 77   Ht 5\' 8"  (1.727 m)   Wt 151 lb 9.6 oz (68.8 kg)   SpO2 97%   BMI 23.05 kg/m    GEN: Well nourished, well developed in no acute distress HEENT: Normal NECK: No JVD; No carotid bruits LYMPHATICS: No lymphadenopathy CARDIAC:RRR, no murmurs, rubs, gallops RESPIRATORY:  Clear to auscultation without rales, wheezing or rhonchi  ABDOMEN: Soft, non-tender, non-distended MUSCULOSKELETAL:   No edema; No deformity  SKIN: Warm and dry NEUROLOGIC:  Alert and oriented x 3 PSYCHIATRIC:  Normal affect    ASSESSMENT & PLAN:    1.  Chest pain -he recently was hospitalized for an episode of CP with negative enzymes -2D echo with new wall motion abnormality in the mid to apical inferior wall>>EF appears worse than prior scan -I will get a Lexiscan myoview to assess for ischemia and if low risk then treat medically -he has not had any further angina since Dec. -Shared Decision Making/Informed Consent{ All outpatient stress  tests require an informed consent ([id number]) ATTESTATION ORDER       :210 The risks [chest pain, shortness of breath, cardiac arrhythmias, dizziness, blood pressure fluctuations, myocardial infarction, stroke/transient ischemic attack, nausea, vomiting, allergic reaction, radiation exposure, metallic taste sensation and life-threatening complications (estimated to be 1 in 10,000)], benefits (risk stratification, diagnosing coronary artery disease, treatment guidance) and alternatives of a nuclear stress test were discussed in detail with Mr. Deer and he agrees to proceed. -no ASA due to DOAC -continue statin  2.  Bilateral carotid artery stenosis - He had dopplers done 11/2018 showing 1-39% bilateral stenosis.  -continue statin -no ASA due to DOAC  3.  Complete HB  - he is s/p PPM followed in device clinic.   -device interrogation in hospital Dec 2021 after syncope in Radiology with no arrhythmia ntoed  4.  HTN  -BP controlled on exam today -stop amlodipine since he needs to be on GDMT for HF -add Hydralazine 10mg  TID and Imdur 15mg  daily -followup with PharmD in 2 weeks  5.  PAF  -he is maintaining NSR on exam today and denies any palpitations -he denies any bleeding problems on Eliquis -continue Eliquis 2.5mg  BID (dosed for age>80 and SCR on average > 1.5) -SCr stable at 1.4-1.63 and Hbg 13.1 in Dec 2021  6.  Hyperlipidemia  - his LDL goal is <  70. -LDL 65 in Sept 2021  -continue on atorvastatin 80mg  daily.   7.  CKD stage 3a - his creatinine is stable at 1.4-1.63  8.  DCM -EF had been 45-50% on echo in 2014 and recently read as 30-35% -I have reviewed recent echo and feel EF is likely 40-45% with significant PSM from RV pacing which makes EF appear worse.  There is a new wall motion abnormality in the mid and apical inferior wall. -see #1 will get -he does not appear volume overloaded on exam today -will avoid ACE/ARB/spiro in setting of CKD -given his LV dysfunction, will stop Amlodipine and start Hydralazine 10mg  TID and Imdur 15mg  daily  Followup with me in 1  Year   Medication Adjustments/Labs and Tests Ordered: Current medicines are reviewed at length with the patient today.  Concerns regarding medicines are outlined above.  Tests Ordered: No orders of the defined types were placed in this encounter.  Medication Changes: No orders of the defined types were placed in this encounter.   Disposition:  Follow up in 6 month(s)  Signed, 09-01-1987, MD  09/26/2020 11:08 AM    [facility]

## 2020-09-26 NOTE — Progress Notes (Signed)
Remote pacemaker transmission.   

## 2020-09-26 NOTE — Addendum Note (Signed)
Addended by: Theresia Majors on: 09/26/2020 11:21 AM   Modules accepted: Orders

## 2020-09-26 NOTE — Progress Notes (Signed)
Date:  09/26/2020   ID:  Edward Suarez, DOB 1932-09-05, MRN [id number]  Patient Location:  Home  Provider location:   [place]  PCP:  Renford Dills, MD  Cardiologist:  Armanda Magic, MD  Electrophysiologist:  None   Chief Complaint:  HTN, PAF, Hyperlipidemia and carotid stenosis  History of Present Illness:    Edward Suarez is a 87 y.o. male with a hx of HTN, DM, dyslipidemia,complete HB s/p PPM and PVC's,mild (1-39% bilateral)carotid artery stenosis and is followed by Dr. Darrick Penna.He also has PAF noted on pacer check in 2016 and due to [facility] score of 5, was started on Eliquis.   He was recently hospitalized in Dec with chest pain.  EKG showed paced rhythm and while in radiology had a near syncopal episode with no arrhythmia on pacer interrogation.  Cardiology was consulted and 2D echo showed moderate LV dysfunction with EF 30% with AK of all mid to apical segments and LV apex.  After discussion with patient he did not want any further inpt evaluation and was discharged home with plan to followup in office to discuss ischemic workup given normal hsTrop during hospital stay.  It was felt that RV pacing with abnormal septal motion may be exaggerating the degree of LV dysfunction.  Due to his advanced age, confusion and restlessness in hospital, he was felt not to be an ideal candidate for cath.    He is here today for followup and is doing well.  he denies any chest pain or pressure, SOB, DOE, PND, orthopnea, LE edema, dizziness, palpitations or syncope. He is compliant with his meds and is tolerating meds with no SE.    Prior CV studies:   The following studies were reviewed today:  none  Past Medical History:  Diagnosis Date  . Allergic rhinitis, cause unspecified   . BPH (benign prostatic hyperplasia)   . Carotid artery stenosis, asymptomatic    1-39% by dopplers 09/2016 followed by Dr. Darrick Penna  . Cataracts, bilateral   . Chronic kidney disease (CKD), stage III  (moderate) (HCC)   . Complete heart block (HCC) 01/23/04   s/p Medtronic PPM implanted by Dr Amil Amen  . COPD (chronic obstructive pulmonary disease) (HCC)   . Diabetes mellitus   . Glaucoma   . Hyperlipidemia   . Hypertension   . 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 Kidney stones   . PAF (paroxysmal atrial fibrillation) (HCC) 10/09/2014   Noted on pacer check with 88 mode switches and longest episode >1 hour.  Now on Apixiban for CHADS2VASC score of 5   Past Surgical History:  Procedure Laterality Date  . PACEMAKER INSERTION  2005, 04/16/12   MDT implanted by Dr Amil Amen with generator chnage (MDT Adapta L) by Dr Johney Frame 04/16/12  . PERMANENT PACEMAKER GENERATOR CHANGE N/A 04/16/2012   Procedure: PERMANENT PACEMAKER GENERATOR CHANGE;  Surgeon: Hillis Range, MD;  Location: [facility] CATH LAB;  Service: Cardiovascular;  Laterality: N/A;     Current Meds  Medication Sig  . amLODipine (NORVASC) 5 MG tablet TAKE 1 TABLET BY MOUTH EVERY DAY (Patient taking differently: Take 5 mg by mouth daily.)  . apixaban (ELIQUIS) 2.5 MG TABS tablet Take 1 tablet (2.5 mg total) by mouth 2 (two) times daily.  [name] Kitchen atorvastatin (LIPITOR) 80 MG tablet TAKE 1 TABLET (80 MG TOTAL) BY MOUTH EVERY EVENING.  . Calcium Carb-Cholecalciferol (CALCIUM 1000 + D PO) Take by mouth. OTC  . cetirizine (ZYRTEC) 10 MG tablet Take 10 mg by  mouth daily. For allergies. Takes prn  . donepezil (ARICEPT) 10 MG tablet TAKE ONE TABLET BY MOUTH EVERY EVENING (Patient taking differently: Take 10 mg by mouth in the morning.)  . fluticasone (FLONASE) 50 MCG/ACT nasal spray Place 2 sprays into both nostrils daily as needed for allergies or rhinitis.  . glipiZIDE (GLUCOTROL) 5 MG tablet Take 5 mg by mouth daily before breakfast.  . levothyroxine (SYNTHROID, LEVOTHROID) 50 MCG tablet Take 50 mcg by mouth daily before breakfast.   . memantine (NAMENDA) 10 MG tablet Take 1 tablet (10 mg total) by mouth 2 (two) times daily.  . nitroGLYCERIN  (NITROSTAT) 0.4 MG SL tablet Place 1 tablet (0.4 mg total) under the tongue every 5 (five) minutes as needed for chest pain.  . pioglitazone (ACTOS) 45 MG tablet Take 45 mg by mouth daily.      Allergies:   Penicillins   Social History   Tobacco Use  . Smoking status: Former Smoker    Packs/day: 1.00    Years: 25.00    Pack years: 25.00    Types: Cigarettes    Quit date: 09/16/1998    Years since quitting: 22.0  . Smokeless tobacco: Never Used  Vaping Use  . Vaping Use: Never used  Substance Use Topics  . Alcohol use: Yes    Alcohol/week: 5.0 standard drinks    Types: 2 Glasses of wine, 3 Shots of liquor per week    Comment: drinks a tequila shot daily  . Drug use: No     Family Hx: The patient's family history includes Heart attack in his father; Heart disease in his father.  ROS:   Please see the history of present illness.     All other systems reviewed and are negative.   Labs/Other Tests and Data Reviewed:    Recent Labs: 09/04/2020: BUN 15; Creatinine, Ser 1.40; Hemoglobin 13.1; Platelets 216; Potassium 3.5; Sodium 139   Recent Lipid Panel Lab Results  Component Value Date/Time   CHOL 128 12/16/2016 08:22 AM   TRIG 74 12/16/2016 08:22 AM   HDL 55 12/16/2016 08:22 AM   CHOLHDL 2.3 12/16/2016 08:22 AM   CHOLHDL 2.7 07/08/2016 08:02 AM   LDLCALC 58 12/16/2016 08:22 AM    Wt Readings from Last 3 Encounters:  09/26/20 151 lb 9.6 oz (68.8 kg)  09/03/20 150 lb 2.1 oz (68.1 kg)  07/11/20 155 lb 6.4 oz (70.5 kg)     Objective:    Vital Signs:  BP (!) 138/58   Pulse 77   Ht 5' 8" (1.727 m)   Wt 151 lb 9.6 oz (68.8 kg)   SpO2 97%   BMI 23.05 kg/m    GEN: Well nourished, well developed in no acute distress HEENT: Normal NECK: No JVD; No carotid bruits LYMPHATICS: No lymphadenopathy CARDIAC:RRR, no murmurs, rubs, gallops RESPIRATORY:  Clear to auscultation without rales, wheezing or rhonchi  ABDOMEN: Soft, non-tender, non-distended MUSCULOSKELETAL:   No edema; No deformity  SKIN: Warm and dry NEUROLOGIC:  Alert and oriented x 3 PSYCHIATRIC:  Normal affect    ASSESSMENT & PLAN:    1.  Chest pain -he recently was hospitalized for an episode of CP with negative enzymes -2D echo with new wall motion abnormality in the mid to apical inferior wall>>EF appears worse than prior scan -I will get a Lexiscan myoview to assess for ischemia and if low risk then treat medically -he has not had any further angina since Dec. -Shared Decision Making/Informed Consent{ All outpatient stress   tests require an informed consent ([id number]) ATTESTATION ORDER       :210 The risks [chest pain, shortness of breath, cardiac arrhythmias, dizziness, blood pressure fluctuations, myocardial infarction, stroke/transient ischemic attack, nausea, vomiting, allergic reaction, radiation exposure, metallic taste sensation and life-threatening complications (estimated to be 1 in 10,000)], benefits (risk stratification, diagnosing coronary artery disease, treatment guidance) and alternatives of a nuclear stress test were discussed in detail with Mr. Deer and he agrees to proceed. -no ASA due to DOAC -continue statin  2.  Bilateral carotid artery stenosis - He had dopplers done 11/2018 showing 1-39% bilateral stenosis.  -continue statin -no ASA due to DOAC  3.  Complete HB  - he is s/p PPM followed in device clinic.   -device interrogation in hospital Dec 2021 after syncope in Radiology with no arrhythmia ntoed  4.  HTN  -BP controlled on exam today -stop amlodipine since he needs to be on GDMT for HF -add Hydralazine 10mg  TID and Imdur 15mg  daily -followup with PharmD in 2 weeks  5.  PAF  -he is maintaining NSR on exam today and denies any palpitations -he denies any bleeding problems on Eliquis -continue Eliquis 2.5mg  BID (dosed for age>80 and SCR on average > 1.5) -SCr stable at 1.4-1.63 and Hbg 13.1 in Dec 2021  6.  Hyperlipidemia  - his LDL goal is <  70. -LDL 65 in Sept 2021  -continue on atorvastatin 80mg  daily.   7.  CKD stage 3a - his creatinine is stable at 1.4-1.63  8.  DCM -EF had been 45-50% on echo in 2014 and recently read as 30-35% -I have reviewed recent echo and feel EF is likely 40-45% with significant PSM from RV pacing which makes EF appear worse.  There is a new wall motion abnormality in the mid and apical inferior wall. -see #1 will get -he does not appear volume overloaded on exam today -will avoid ACE/ARB/spiro in setting of CKD -given his LV dysfunction, will stop Amlodipine and start Hydralazine 10mg  TID and Imdur 15mg  daily  Followup with me in 1  Year   Medication Adjustments/Labs and Tests Ordered: Current medicines are reviewed at length with the patient today.  Concerns regarding medicines are outlined above.  Tests Ordered: No orders of the defined types were placed in this encounter.  Medication Changes: No orders of the defined types were placed in this encounter.   Disposition:  Follow up in 6 month(s)  Signed, 09-01-1987, MD  09/26/2020 11:08 AM    [facility]

## 2020-09-26 NOTE — Patient Instructions (Signed)
Medication Instructions:  Your physician has recommended you make the following change in your medication:  1) STOP taking Norvasc (amlodipine) 2) START taking hydralazine 10 mg three times per day  3) START taking Imdur (isosorbide mononitrate) 15 mg daily   *If you need a refill on your cardiac medications before your next appointment, please call your pharmacy*  Testing/Procedures: Your physician has requested that you have a lexiscan myoview. For further information please visit [email or web address] Please follow instruction sheet, as given.  Follow-Up: At [facility], you and your health needs are our priority.  As part of our continuing mission to provide you with exceptional heart care, we have created designated Provider Care Teams.  These Care Teams include your primary Cardiologist (physician) and Advanced Practice Providers (APPs -  Physician Assistants and Nurse Practitioners) who all work together to provide you with the care you need, when you need it.  Your next appointment:   3 month(s)  The format for your next appointment:   In Person  Provider:   You may see Armanda Magic, MD or one of the following Advanced Practice Providers on your designated Care Team:    Ronie Spies, PA-C  Jacolyn Reedy, PA-C  Other Instructions You have been referred to follow-up with our Pharmacist in the Hypertension Clinic in 2 weeks.

## 2020-09-27 ENCOUNTER — Other Ambulatory Visit: Payer: Self-pay

## 2020-09-27 ENCOUNTER — Telehealth (HOSPITAL_COMMUNITY): Payer: Self-pay | Admitting: *Deleted

## 2020-09-27 DIAGNOSIS — R079 Chest pain, unspecified: Secondary | ICD-10-CM

## 2020-09-27 DIAGNOSIS — E1122 Type 2 diabetes mellitus with diabetic chronic kidney disease: Secondary | ICD-10-CM | POA: Diagnosis not present

## 2020-09-27 DIAGNOSIS — E039 Hypothyroidism, unspecified: Secondary | ICD-10-CM | POA: Diagnosis not present

## 2020-09-27 DIAGNOSIS — J439 Emphysema, unspecified: Secondary | ICD-10-CM | POA: Diagnosis not present

## 2020-09-27 DIAGNOSIS — I480 Paroxysmal atrial fibrillation: Secondary | ICD-10-CM | POA: Diagnosis not present

## 2020-09-27 DIAGNOSIS — N1832 Chronic kidney disease, stage 3b: Secondary | ICD-10-CM | POA: Diagnosis not present

## 2020-09-27 DIAGNOSIS — N183 Chronic kidney disease, stage 3 unspecified: Secondary | ICD-10-CM | POA: Diagnosis not present

## 2020-09-27 DIAGNOSIS — E7800 Pure hypercholesterolemia, unspecified: Secondary | ICD-10-CM | POA: Diagnosis not present

## 2020-09-27 DIAGNOSIS — E782 Mixed hyperlipidemia: Secondary | ICD-10-CM | POA: Diagnosis not present

## 2020-09-27 DIAGNOSIS — I10 Essential (primary) hypertension: Secondary | ICD-10-CM | POA: Diagnosis not present

## 2020-09-27 DIAGNOSIS — F0390 Unspecified dementia without behavioral disturbance: Secondary | ICD-10-CM | POA: Diagnosis not present

## 2020-09-27 NOTE — Telephone Encounter (Signed)
Patient's wife per DPR was given detailed instructions per Myocardial Perfusion Study Information Sheet for the test on 10/04/20 at 1030. Patient notified to arrive 15 minutes early and that it is imperative to arrive on time for appointment to keep from having the test rescheduled.  If you need to cancel or reschedule your appointment, please call the office within 24 hours of your appointment. . Patient verbalized understanding.Adrian Dinovo, Adelene Idler  No mychart available.

## 2020-10-04 ENCOUNTER — Other Ambulatory Visit: Payer: Self-pay

## 2020-10-04 ENCOUNTER — Ambulatory Visit (HOSPITAL_COMMUNITY): Payer: PPO | Attending: Cardiovascular Disease

## 2020-10-04 DIAGNOSIS — R072 Precordial pain: Secondary | ICD-10-CM | POA: Insufficient documentation

## 2020-10-04 LAB — MYOCARDIAL PERFUSION IMAGING
LV dias vol: 158 mL (ref 62–150)
LV sys vol: 116 mL
Peak HR: 71 {beats}/min
Rest HR: 71 {beats}/min
SDS: 8
SRS: 4
SSS: 14
TID: 1.03

## 2020-10-04 MED ORDER — TECHNETIUM TC 99M TETROFOSMIN IV KIT
30.4000 | PACK | Freq: Once | INTRAVENOUS | Status: AC | PRN
  Administered 2020-10-04: 30.4 via INTRAVENOUS
  Filled 2020-10-04: qty 31

## 2020-10-04 MED ORDER — REGADENOSON 0.4 MG/5ML IV SOLN
0.4000 mg | Freq: Once | INTRAVENOUS | Status: AC
  Administered 2020-10-04: 0.4 mg via INTRAVENOUS

## 2020-10-04 MED ORDER — TECHNETIUM TC 99M TETROFOSMIN IV KIT
10.2000 | PACK | Freq: Once | INTRAVENOUS | Status: AC | PRN
  Administered 2020-10-04: 10.2 via INTRAVENOUS
  Filled 2020-10-04: qty 11

## 2020-10-10 NOTE — Progress Notes (Addendum)
Patient ID: Edward Suarez                 DOB: 14-Feb-1932                      MRN: [id number]     HPI: BRADIN Suarez is a 87 y.o. male referred by Dr. Mayford Suarez to HTN clinic. PMH is significant for HTN, DM, CKD Stage 3a, dyslipidemia, complete HB s/p PPM and PVCs, mild (1-39% bilateral) carotid artery stenosis, PAF on Eliquis. He was hospitalized 09/02/20 for chest pain. Echo on 09/02/20 showed EF 30%. After discussion with patient, he did not want any further workup and was discharged home. Pt was continued on Eliquis, atorvastatin 80 mg daily and started on sublingual NTG. He was last seen by Dr. Mayford Suarez on 09/26/2020. During visit, he denied chest pain or pressure, SOB, DOE, PND, orthopnea, LEE, dizzinness, palpitations, or syncope. His amlodipine was stopped and hydralazine 10 mg TID and Imdur 15 mg daily were started. Myoview Stress test on 10/04/2020 showed a high risk study with LVEF <30% and evidence of a prior MI. Pt will be scheduled for heart cath and is seeing Edward Suarez later today.  Today he presents to clinic with his wife. They are in good spirits. His wife is the main caregiver and helps manage medications and finances. She reports that [name] has had no unusual side effects since starting hydralazine. [name] denies any SOB or chest pain. He reports only needing 1 pillow to sleep comfortably at night. They report getting medications from a pill pack pharmacy and just receiving a 22-month supply that included newly prescribed hydralazine. They have about 3 weeks left. He does not have a blood pressure cuff at home and is not currently checking home BP. We discussed that the pioglitazone used for DM is contraindicated in heart failure and he should stop taking this medicine. We discussed the potential to initiate Jardiance but pt's wife concerned about cost since it is a branded medication.   Current HTN/CHF meds:  Hydralazine 10 mg TID Imdur 15mg  daily  Previously tried:   Amlodipine 5 mg  Fosinopril 40 mg   BP goal: < 130/80 mmHg  Family History: The patient's family history includes Heart attack in his father; Heart disease in his father.  Social History: Former smoker, quit date 2000  Home BP readings: not currently checking at home   Labs: 09/04/20: Na 139, K 3.5, Scr 1.40 09/02/20: Na 140, K 3.9, Scr 1.63 09/02/18: Na 141, K 4.2, Scr 1.39  Wt Readings from Last 3 Encounters:  10/04/20 151 lb (68.5 kg)  09/26/20 151 lb 9.6 oz (68.8 kg)  09/03/20 150 lb 2.1 oz (68.1 kg)   BP Readings from Last 3 Encounters:  09/26/20 (!) 138/58  09/04/20 (!) 142/67  07/11/20 (!) 147/69   Pulse Readings from Last 3 Encounters:  09/26/20 77  09/04/20 71  07/11/20 78    Renal function: CrCl cannot be calculated (Patient's most recent lab result is older than the maximum 21 days allowed.).  Past Medical History:  Diagnosis Date  . Allergic rhinitis, cause unspecified   . BPH (benign prostatic hyperplasia)   . Carotid artery stenosis, asymptomatic    1-39% by dopplers 09/2016 followed by Dr. 10/2016  . Cataracts, bilateral   . Chronic kidney disease (CKD), stage III (moderate) (HCC)   . Complete heart block (HCC) 01/23/04   s/p Medtronic PPM implanted by Dr 03/24/04  .  COPD (chronic obstructive pulmonary disease) (HCC)   . Diabetes mellitus   . Glaucoma   . Hyperlipidemia   . Hypertension   . 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 Kidney stones   . PAF (paroxysmal atrial fibrillation) (HCC) 10/09/2014   Noted on pacer check with 88 mode switches and longest episode >1 hour.  Now on Apixiban for CHADS2VASC score of 5    Current Outpatient Medications on File Prior to Visit  Medication Sig Dispense Refill  . apixaban (ELIQUIS) 2.5 MG TABS tablet Take 1 tablet (2.5 mg total) by mouth 2 (two) times daily. 180 tablet 3  . atorvastatin (LIPITOR) 80 MG tablet TAKE 1 TABLET (80 MG TOTAL) BY MOUTH EVERY EVENING. 90 tablet 2  . Calcium  Carb-Cholecalciferol (CALCIUM 1000 + D PO) Take by mouth. OTC    . cetirizine (ZYRTEC) 10 MG tablet Take 10 mg by mouth daily. For allergies. Takes prn    . donepezil (ARICEPT) 10 MG tablet TAKE ONE TABLET BY MOUTH EVERY EVENING (Patient taking differently: Take 10 mg by mouth in the morning.) 90 tablet 3  . fluticasone (FLONASE) 50 MCG/ACT nasal spray Place 2 sprays into both nostrils daily as needed for allergies or rhinitis.    [name] Kitchen glipiZIDE (GLUCOTROL) 5 MG tablet Take 5 mg by mouth daily before breakfast.    . hydrALAZINE (APRESOLINE) 10 MG tablet Take 1 tablet (10 mg total) by mouth 3 (three) times daily. 270 tablet 3  . isosorbide mononitrate (IMDUR) 30 MG 24 hr tablet Take 0.5 tablets (15 mg total) by mouth daily. 45 tablet 3  . levothyroxine (SYNTHROID, LEVOTHROID) 50 MCG tablet Take 50 mcg by mouth daily before breakfast.   3  . memantine (NAMENDA) 10 MG tablet Take 1 tablet (10 mg total) by mouth 2 (two) times daily. 180 tablet 3  . nitroGLYCERIN (NITROSTAT) 0.4 MG SL tablet Place 1 tablet (0.4 mg total) under the tongue every 5 (five) minutes as needed for chest pain. 14 tablet 0  . pioglitazone (ACTOS) 45 MG tablet Take 45 mg by mouth daily.      No current facility-administered medications on file prior to visit.    Allergies  Allergen Reactions  . Penicillins Rash     Assessment/Plan:  1. HFrEF/Hypertension - BP 130/78 mmHg in clinic at goal of < 130/80 mmHg. Recent Echo showing EF < 30% and patient is not on current optimal GDMT for heart failure. Pt not currently appearing fluid overloaded or decompensated. Will start metoprolol succinate 25 mg daily. Will continue hydralazine 10 mg TID until patient's pill pack for the month is completed. Once pill pack completed, will initiate losartan 25 mg daily for CHF benefit and discontinue hydralazine 10 mg TID (no CHF benefit in Caucasian patients, will not need additional BP control). Will discontinue pioglitazone 45 mg daily secondary  to HF diagnosis. Will defer initiating Jardiance 10 mg daily for later visit secondary to cost. We will call pharmacy to confirm that they will send 3-week supply of metoprolol to patient, that current pill pack does not contain pioglitazone, and when the next pill pack will go out to replace losartan with hydralazine. Will follow-up with patient in clinic for BP check and BMET in 5 weeks (about 2 weeks after losartan initiation). Pt encouraged to purchase bicep BP cuff that also measures HR and to check BP/HR at least 3x week and keep log. Pt advised to call clinic if HR consistently < 55 bpm or if SBP consistently <  100 mmHg.   Patient will benefit from Jardiance initiation to optimize DM, CKD and HF management. Ideally, patient would also be started on Entresto and spironolactone. [facility] chosen over [facility] secondary to cost. Spironolactone will be deferred for future initiation secondary to multiple med changes in one visit and risk of developing hypotension since BP currently at goal.   2. Anticoagulation - Sent refill to pharmacy for Eliquis 2.5 mg BID. Patient qualifies for reduced dose based on age (49) and Scr which has fluctuated around 1.5 consistently. Per last MD visit, pt to continue on lower dose.  Sula Soda, PharmD Candidate  Megan E. Supple, PharmD, BCACP, CPP [facility] [address] [place], [place] [address] Phone: [phone]; Fax: [phone] 10/11/2020 12:35 PM   Addendum: Called patient's pharmacy on 10/11/2020 to confirm following:  - Pt still had pioglitazone in current pill pack. Will call pt to discontinue packages. Pill description: white, round, H/33. Is in 2/2 breakfast packaging.  - Confirmed receipt of metoprolol 25 mg daily. Pharmacy will send 3 week supply and it will be added to next pill pack - Canceled future hydralazine refills. Pharmacy aware not to fill for next pack.  - Verbally prescribed losartan 25 mg tabs - 1 tab daily  (#30, 11R) to pharmacy. Pharmacy aware that patient does not need supply now and will fill in next pack. This replaces hydralazine 10 mg TID. - Confirmed receipt of Eliquis 2.5 mg BID rx. Pharmacy aware to fill for next pack.   Sula Soda, PharmD Candidate

## 2020-10-11 ENCOUNTER — Ambulatory Visit: Payer: PPO | Admitting: Physician Assistant

## 2020-10-11 ENCOUNTER — Ambulatory Visit (INDEPENDENT_AMBULATORY_CARE_PROVIDER_SITE_OTHER): Payer: PPO | Admitting: Pharmacist

## 2020-10-11 ENCOUNTER — Other Ambulatory Visit: Payer: Self-pay

## 2020-10-11 ENCOUNTER — Encounter: Payer: Self-pay | Admitting: Physician Assistant

## 2020-10-11 VITALS — BP 130/78 | HR 72 | Wt 152.0 lb

## 2020-10-11 VITALS — BP 132/68 | HR 77 | Ht 68.0 in | Wt 153.0 lb

## 2020-10-11 DIAGNOSIS — N1831 Chronic kidney disease, stage 3a: Secondary | ICD-10-CM

## 2020-10-11 DIAGNOSIS — I5020 Unspecified systolic (congestive) heart failure: Secondary | ICD-10-CM

## 2020-10-11 DIAGNOSIS — I420 Dilated cardiomyopathy: Secondary | ICD-10-CM | POA: Diagnosis not present

## 2020-10-11 DIAGNOSIS — I10 Essential (primary) hypertension: Secondary | ICD-10-CM

## 2020-10-11 DIAGNOSIS — E7800 Pure hypercholesterolemia, unspecified: Secondary | ICD-10-CM

## 2020-10-11 DIAGNOSIS — I442 Atrioventricular block, complete: Secondary | ICD-10-CM | POA: Diagnosis not present

## 2020-10-11 DIAGNOSIS — Z950 Presence of cardiac pacemaker: Secondary | ICD-10-CM

## 2020-10-11 DIAGNOSIS — I480 Paroxysmal atrial fibrillation: Secondary | ICD-10-CM | POA: Diagnosis not present

## 2020-10-11 MED ORDER — APIXABAN 2.5 MG PO TABS: 2.5000 mg | ORAL_TABLET | Freq: Two times a day (BID) | ORAL | 1 refills | Status: DC

## 2020-10-11 MED ORDER — METOPROLOL SUCCINATE ER 25 MG PO TB24: 25.0000 mg | ORAL_TABLET | Freq: Every day | ORAL | 11 refills | Status: DC

## 2020-10-11 NOTE — Patient Instructions (Addendum)
It was great to meet you today!  Your blood pressure today is 130/78 mmHg. Your blood pressure goal is to be less than 130/80 mmHg.   Start taking metoprolol succinate 1 tablet (25 mg) daily.   Keep taking hydralazine 1 table (10 mg) three times a day. Once your pill pack for the month is completed, we will stop hydralazine and start losartan 1 tablet (25 mg) once a day.   Stop taking pioglitazone every day.   At the next visit, we will discuss starting Jardiance. This medicine will be good for your heart, kidneys and diabetes.   Start checking your blood pressure and heart rate at least three times a week. You can get a blood pressure cuff at your local pharmacy.  We will follow-up with you in 1 month to check your blood pressure and heart rate.   Please give [place] a call if your heart rate is less than 55 bpm or if your top blood pressure number is less than 100 mmHg. [phone].

## 2020-10-11 NOTE — Patient Instructions (Addendum)
Medication Instructions:  Your physician recommends that you continue on your current medications as directed. Please refer to the Current Medication list given to you today.  *If you need a refill on your cardiac medications before your next appointment, please call your pharmacy*  Lab Work: You will have labs drawn today: BMET/CBC  Testing/Procedures: Due to recent COVID-19 restrictions implemented by our local and state authorities and in an effort to keep both patients and staff as safe as possible, our hospital system requires COVID-19 testing prior to certain scheduled hospital procedures. Please go to [address]. [place], [place] [address] on 10/16/20 at 12:10PM  . This is a drive up testing site. You will not need to exit your vehicle.  You will not be billed at the time of testing but may receive a bill later depending on your insurance. You must agree to self-quarantine from the time of your testing until the procedure date on 10/18/20.  This should included staying home with ONLY the people you live with. Avoid take-out, grocery store shopping or leaving the house for any non-emergent reason. Failure to have your COVID-19 test done on the date and time you have been scheduled will result in cancellation of your procedure. Please call our office at [phone] if you have any questions.     [facility] ST OFFICE [address] [place] [address] Dept: [phone] Loc: [phone]  Edward Suarez  10/11/2020  You are scheduled for a Cardiac Catheterization on Wednesday, February 2 with Dr. Nicki Guadalajara.  1. Please arrive at the [facility] (Main Entrance A) at [facility]: [address] [place], [place] [address] at 9:30 AM (This time is two hours before your procedure to ensure your preparation). Free valet parking service is available.   Special note: Every effort is made to  have your procedure done on time. Please understand that emergencies sometimes delay scheduled procedures.  2.Diet: Do not eat solid foods after midnight.  The patient may have clear liquids until 5am upon the day of the procedure.  3. Medication instructions in preparation for your procedure:   Contrast Allergy: No  Stop taking Eliquis (Apixiban) on Monday, January 31.  Do not take Glipizide the morning of your procedure. Do not take Losartan the day before and the day of your procedure.  On the morning of your procedure, take your Aspirin 81 mg and any morning medicines NOT listed above.  You may use sips of water.  4. Plan for one night stay--bring personal belongings. 5. Bring a current list of your medications and current insurance cards. 6. You MUST have a responsible person to drive you home. 7. Someone MUST be with you the first 24 hours after you arrive home or your discharge will be delayed. 8. Please wear clothes that are easy to get on and off and wear slip-on shoes.  Thank you for allowing [place] to care for you!   -- [facility]  Follow-Up: On 11/01/20 at 10:40AM with Armanda Magic, MD

## 2020-10-11 NOTE — Progress Notes (Signed)
Cardiology Office Note:    Date:  10/11/2020   ID:  CURTIES CONIGLIARO, DOB 12-17-1931, MRN [id number]  PCP:  Renford Dills, MD  [facility] Cardiologist:  Armanda Magic, MD   [facility] Electrophysiologist:  None   Referring MD: Renford Dills, MD   Chief Complaint:  Follow-up (Abnormal Myoview - Needs cath)    Patient Profile:    Edward Suarez is a 87 y.o. male with:   Paroxysmal atrial fibrillation   CHA2DS2-VASc Score = 6 [CHF History: Yes, HTN History: Yes, Diabetes History: Yes, Stroke History: No, Vascular Disease History: Yes, Age Score: 2, Gender Score: 0].    Apixaban Rx  Heart failure with reduced ejection fraction   Echocardiogram 08/2020: EF 30-35  Myoview 1/22: Lg inf infarct w min peri-infarct ischemia, EF 27  Hypertension   Diabetes mellitus   Hyperlipidemia   Complete HB   S/p pacer  PVCs  Carotid artery disease  Chronic kidney disease   Aortic atherosclerosis (CT 03/2016)  Prior CV studies: Myoview 10/04/2020 EF 27, large inferior infarct with minimal peri-infarct ischemia; high risk  Echo 09/03/2020 EF 30, AK of all mid-apical septal segments, mid-apical inferior segments and LV apex, severe HK of mid-apical inferolateral segment, mild concentric LVH, GR 1 DD, normal RVSF, moderate LAE, mild MR, mild AV calcification, trivial AI  Carotid US 11/19/2018 Bilateral ICA 1-39  Echo 04/30/2013 EF 45-50, apical HK, abnormal septal motion due to BBB, mild MR, mild TR, mild PI, GR 1 DD  History of Present Illness:    [name] was hospitalized in December 2021 with chest pain.  Echocardiogram demonstrated EF 30% with diffuse akinesis.  He did not want to undergo inpatient ischemic work-up and he was discharged home.  He saw Dr. Mayford Knife 09/26/2020.  He was set up for a [employer].  He was started on hydralazine.  His Myoview demonstrated inferior infarct with minimal peri-infarct ischemia.  Dr. Mayford Knife has recommended proceeding with right  and left heart catheterization.  He presents to the office today to arrange cardiac catheterization.  He is here with his wife.  He saw the PharmD clinic earlier today and med changes included changing Hydralazine to Losartan and stopping pioglitazone.  Since last seen, he has done well without chest pain, shortness of breath, syncope, orthopnea, leg edema.    Past Medical History:  Diagnosis Date  . Allergic rhinitis, cause unspecified   . BPH (benign prostatic hyperplasia)   . Carotid artery stenosis, asymptomatic    1-39% by dopplers 09/2016 followed by Dr. Darrick Penna  . Cataracts, bilateral   . Chronic kidney disease (CKD), stage III (moderate) (HCC)   . Complete heart block (HCC) 01/23/04   s/p Medtronic PPM implanted by Dr Amil Amen  . COPD (chronic obstructive pulmonary disease) (HCC)   . Diabetes mellitus   . Glaucoma   . Hyperlipidemia   . Hypertension   . 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 Kidney stones   . PAF (paroxysmal atrial fibrillation) (HCC) 10/09/2014   Noted on pacer check with 88 mode switches and longest episode >1 hour.  Now on Apixiban for CHADS2VASC score of 5    Current Medications: Current Meds  Medication Sig  . apixaban (ELIQUIS) 2.5 MG TABS tablet Take 1 tablet (2.5 mg total) by mouth 2 (two) times daily.  [name] Kitchen atorvastatin (LIPITOR) 80 MG tablet TAKE 1 TABLET (80 MG TOTAL) BY MOUTH EVERY EVENING.  . Calcium Carb-Cholecalciferol (CALCIUM 1000 + D PO) Take by mouth.  OTC  . cetirizine (ZYRTEC) 10 MG tablet Take 10 mg by mouth daily. For allergies. Takes prn  . donepezil (ARICEPT) 10 MG tablet TAKE ONE TABLET BY MOUTH EVERY EVENING  . fluticasone (FLONASE) 50 MCG/ACT nasal spray Place 2 sprays into both nostrils daily as needed for allergies or rhinitis.  [name] Kitchen glipiZIDE (GLUCOTROL) 5 MG tablet Take 5 mg by mouth daily before breakfast.  . hydrALAZINE (APRESOLINE) 10 MG tablet Take 1 tablet (10 mg total) by mouth 3 (three) times daily.  . isosorbide  mononitrate (IMDUR) 30 MG 24 hr tablet Take 0.5 tablets (15 mg total) by mouth daily.  [name] Kitchen levothyroxine (SYNTHROID, LEVOTHROID) 50 MCG tablet Take 50 mcg by mouth daily before breakfast.   . memantine (NAMENDA) 10 MG tablet Take 1 tablet (10 mg total) by mouth 2 (two) times daily.  . metoprolol succinate (TOPROL XL) 25 MG 24 hr tablet Take 1 tablet (25 mg total) by mouth daily.  . nitroGLYCERIN (NITROSTAT) 0.4 MG SL tablet Place 1 tablet (0.4 mg total) under the tongue every 5 (five) minutes as needed for chest pain.     Allergies:   Penicillins   Social History   Tobacco Use  . Smoking status: Former Smoker    Packs/day: 1.00    Years: 25.00    Pack years: 25.00    Types: Cigarettes    Quit date: 09/16/1998    Years since quitting: 22.0  . Smokeless tobacco: Never Used  Vaping Use  . Vaping Use: Never used  Substance Use Topics  . Alcohol use: Yes    Alcohol/week: 5.0 standard drinks    Types: 2 Glasses of wine, 3 Shots of liquor per week    Comment: drinks a tequila shot daily  . Drug use: No     Family Hx: The patient's family history includes Heart attack in his father; Heart disease in his father.  Review of Systems  Constitutional: Negative for fever.  Respiratory: Negative for cough.   Gastrointestinal: Negative for hematochezia and melena.  Genitourinary: Negative for hematuria.  All other systems reviewed and are negative.    EKGs/Labs/Other Test Reviewed:    EKG:  EKG is   ordered today.  The ekg ordered today demonstrates v paced, HR 77  Recent Labs: 09/04/2020: BUN 15; Creatinine, Ser 1.40; Hemoglobin 13.1; Platelets 216; Potassium 3.5; Sodium 139   Recent Lipid Panel Lab Results  Component Value Date/Time   CHOL 128 12/16/2016 08:22 AM   TRIG 74 12/16/2016 08:22 AM   HDL 55 12/16/2016 08:22 AM   CHOLHDL 2.3 12/16/2016 08:22 AM   CHOLHDL 2.7 07/08/2016 08:02 AM   LDLCALC 58 12/16/2016 08:22 AM      Risk Assessment/Calculations:    CHA2DS2-VASc  Score = 6  This indicates a 9.7% annual risk of stroke. The patient's score is based upon: CHF History: Yes HTN History: Yes Diabetes History: Yes Stroke History: No Vascular Disease History: Yes Age Score: 2 Gender Score: 0     Physical Exam:    VS:  BP 132/68   Pulse 77   Ht 5\' 8"  (1.727 m)   Wt 153 lb (69.4 kg)   SpO2 95%   BMI 23.26 kg/m     Wt Readings from Last 3 Encounters:  10/11/20 153 lb (69.4 kg)  10/11/20 152 lb (68.9 kg)  10/04/20 151 lb (68.5 kg)     Constitutional:      Appearance: Healthy appearance. Not in distress.  Neck:     Thyroid:  No thyromegaly.     Vascular: No JVR. JVD normal.     Lymphadenopathy: No cervical adenopathy.  Pulmonary:     Effort: Pulmonary effort is normal.     Breath sounds: No wheezing. No rales.  Cardiovascular:     Normal rate. Regular rhythm. Normal S1. Normal S2.     Murmurs: There is no murmur.  Edema:    Peripheral edema absent.  Abdominal:     Palpations: Abdomen is soft. There is no hepatomegaly.  Skin:    General: Skin is warm and dry.  Neurological:     General: No focal deficit present.     Mental Status: Alert and oriented to person, place and time.     Cranial Nerves: Cranial nerves are intact.       ASSESSMENT & PLAN:    1. DCM (dilated cardiomyopathy) (HCC) 2. HFrEF (heart failure with reduced ejection fraction) (HCC) EF 30.  Recent Myoview with large inferior scar suggesting ischemic CM.  NYHA II.  Volume status stable.  As noted, he saw the PharmD clinic earlier and he is getting started on GDMT.  R and L cardiac catheterization has been recommended.  We discussed the procedure today and he is willing to proceed.    3. PAF (paroxysmal atrial fibrillation) (HCC) Maintaining normal sinus rhythm.  Continue Apixaban at current dose.  He will hold Apixaban for 2 days prior to his cardiac catheterization.    4. Atrioventricular block, complete (HCC) 5. Cardiac pacemaker in situ F/u with EP as  planned.   6. Stage 3a chronic kidney disease (HCC) He is higher risk for contrast induced nephropathy given his baseline renal dysfunction.  Obtain f/u BMET today.  GFR based upon last Creatinine is 49.  We will go ahead an hold his Losartan the day before and day of his cath.   7. Essential hypertension Fair control.  Meds for CHF are currently being adjusted.  8. Hypercholesterolemia without hypertriglyceridemia Continue high intensity statin.  If + CAD on cath, goal LDL is < 70.      Shared Decision Making/Informed Consent The risks [stroke (1 in 1000), death (1 in 1000), kidney failure [usually temporary] (1 in 500), bleeding (1 in 200), allergic reaction [possibly serious] (1 in 200)], benefits (diagnostic support and management of coronary artery disease) and alternatives of a cardiac catheterization were discussed in detail with Mr. Dahlstrom and he is willing to proceed.  Dispo:  No follow-ups on file.   Medication Adjustments/Labs and Tests Ordered: Current medicines are reviewed at length with the patient today.  Concerns regarding medicines are outlined above.  Tests Ordered: Orders Placed This Encounter  Procedures  . CBC  . Basic metabolic panel  . EKG 12-Lead   Medication Changes: No orders of the defined types were placed in this encounter.   Signed, Tereso Newcomer, PA-C  10/11/2020 4:30 PM    [facility] [address] [place], [place], [place]  [address] Phone: [phone]; Fax: [phone]

## 2020-10-12 ENCOUNTER — Telehealth: Payer: Self-pay | Admitting: *Deleted

## 2020-10-12 LAB — BASIC METABOLIC PANEL
BUN/Creatinine Ratio: 16 (ref 10–24)
BUN: 26 mg/dL (ref 8–27)
CO2: 22 mmol/L (ref 20–29)
Calcium: 9.6 mg/dL (ref 8.6–10.2)
Chloride: 106 mmol/L (ref 96–106)
Creatinine, Ser: 1.63 mg/dL — ABNORMAL HIGH (ref 0.76–1.27)
GFR calc Af Amer: 43 mL/min/{1.73_m2} — ABNORMAL LOW (ref >59)
GFR calc non Af Amer: 38 mL/min/{1.73_m2} — ABNORMAL LOW (ref >59)
Glucose: 110 mg/dL — ABNORMAL HIGH (ref 65–99)
Potassium: 4.3 mmol/L (ref 3.5–5.2)
Sodium: 144 mmol/L (ref 134–144)

## 2020-10-12 LAB — CBC
Hematocrit: 42.8 % (ref 37.5–51.0)
Hemoglobin: 13.7 g/dL (ref 13.0–17.7)
MCH: 26.6 pg (ref 26.6–33.0)
MCHC: 32 g/dL (ref 31.5–35.7)
MCV: 83 fL (ref 79–97)
Platelets: 231 10*3/uL (ref 150–450)
RBC: 5.15 x10E6/uL (ref 4.14–5.80)
RDW: 15 % (ref 11.6–15.4)
WBC: 6.1 10*3/uL (ref 3.4–10.8)

## 2020-10-12 NOTE — Telephone Encounter (Addendum)
Per Tereso Newcomer, PA-C-see  BMP results 10/11/20:  -Plan to arrive at 6:30 AM for hydration prior to 11:30 AM procedure 10/18/20 discussed with patient's wife (DPR), [name].  -[name] is aware that we would want patient to hold losartan the day before and day of procedure.   [name] reports patient will not get prescription for losartan until 10/18/20,  pharmacist was aware of that and instructed patient to continue hydralazine until getting losartan prescription.  -[name] reports patient does not use NSAIDs.

## 2020-10-16 ENCOUNTER — Other Ambulatory Visit (HOSPITAL_COMMUNITY)
Admission: RE | Admit: 2020-10-16 | Discharge: 2020-10-16 | Disposition: A | Discharge status: Home / Self Care | Payer: PPO | Source: Ambulatory Visit | Attending: Cardiovascular Disease | Admitting: Cardiovascular Disease

## 2020-10-16 ENCOUNTER — Telehealth: Payer: Self-pay | Admitting: *Deleted

## 2020-10-16 DIAGNOSIS — Z20822 Contact with and (suspected) exposure to covid-19: Secondary | ICD-10-CM | POA: Diagnosis not present

## 2020-10-16 DIAGNOSIS — Z01812 Encounter for preprocedural laboratory examination: Secondary | ICD-10-CM | POA: Diagnosis not present

## 2020-10-16 LAB — SARS CORONAVIRUS 2 (TAT 6-24 HRS): SARS Coronavirus 2: NEGATIVE

## 2020-10-16 NOTE — Telephone Encounter (Addendum)
Pt contacted pre-catheterization scheduled at [facility] for: Wednesday October 18, 2020 11:30 AM Verified arrival time and place: [facility] Main Entrance A [place]) at: 6:30 AM-pre-procedure hydration   No solid food after midnight prior to cath, clear liquids until 5 AM day of procedure.  Hold: Eliquis-none 10/16/20 until post procedure Glipizide-AM of procedure Losartan-pt will not start until after procedure 10/18/20-see pharmacy notes 10/11/20 Actos-pt not taking removed from pill pack per wife -see pharmacy notes 10/11/20  Except hold medications AM meds can be  taken pre-cath with sips of water including: ASA 81 mg   Confirmed patient has responsible adult to drive home post procedure and be with patient first 24 hours after arriving home:  You are allowed ONE visitor in the waiting room during the time you are at the hospital for your procedure. Both you and your visitor must wear a mask once you enter the hospital.   Reviewed procedure/mask/visitor instructions with patient's wife (DPR), [place].

## 2020-10-17 NOTE — Telephone Encounter (Addendum)
[name] reports patient is hard of hearing and does have some cognitive impairment. She plans to accompany patient to the hospital and knows [facility] allows one visitor to wait in the hospital waiting room. She is concerned that pt will not be able to hear and/or understand questions/instructions, and requests to be allowed to assist patient if necessary-[phone]. [name] advised that I forward this information to Cath Lab so that they will be aware of situation.

## 2020-10-18 ENCOUNTER — Encounter (HOSPITAL_COMMUNITY)
Admission: RE | Disposition: A | Discharge status: Home / Self Care | Payer: Self-pay | Source: Home / Self Care | Attending: Cardiovascular Disease

## 2020-10-18 ENCOUNTER — Ambulatory Visit (HOSPITAL_COMMUNITY)
Admission: RE | Admit: 2020-10-18 | Discharge: 2020-10-18 | Disposition: A | Discharge status: Home / Self Care | Payer: PPO | Attending: Cardiovascular Disease | Admitting: Cardiovascular Disease

## 2020-10-18 ENCOUNTER — Other Ambulatory Visit: Payer: Self-pay

## 2020-10-18 DIAGNOSIS — I442 Atrioventricular block, complete: Secondary | ICD-10-CM | POA: Insufficient documentation

## 2020-10-18 DIAGNOSIS — I5020 Unspecified systolic (congestive) heart failure: Secondary | ICD-10-CM | POA: Diagnosis not present

## 2020-10-18 DIAGNOSIS — I130 Hypertensive heart and chronic kidney disease with heart failure and stage 1 through stage 4 chronic kidney disease, or unspecified chronic kidney disease: Secondary | ICD-10-CM | POA: Diagnosis not present

## 2020-10-18 DIAGNOSIS — Z87891 Personal history of nicotine dependence: Secondary | ICD-10-CM | POA: Insufficient documentation

## 2020-10-18 DIAGNOSIS — I509 Heart failure, unspecified: Secondary | ICD-10-CM | POA: Insufficient documentation

## 2020-10-18 DIAGNOSIS — Z7984 Long term (current) use of oral hypoglycemic drugs: Secondary | ICD-10-CM | POA: Insufficient documentation

## 2020-10-18 DIAGNOSIS — I6523 Occlusion and stenosis of bilateral carotid arteries: Secondary | ICD-10-CM | POA: Diagnosis not present

## 2020-10-18 DIAGNOSIS — Z7901 Long term (current) use of anticoagulants: Secondary | ICD-10-CM | POA: Diagnosis not present

## 2020-10-18 DIAGNOSIS — I2510 Atherosclerotic heart disease of native coronary artery without angina pectoris: Secondary | ICD-10-CM | POA: Diagnosis not present

## 2020-10-18 DIAGNOSIS — E785 Hyperlipidemia, unspecified: Secondary | ICD-10-CM | POA: Insufficient documentation

## 2020-10-18 DIAGNOSIS — I480 Paroxysmal atrial fibrillation: Secondary | ICD-10-CM | POA: Insufficient documentation

## 2020-10-18 DIAGNOSIS — Z79890 Hormone replacement therapy (postmenopausal): Secondary | ICD-10-CM | POA: Insufficient documentation

## 2020-10-18 DIAGNOSIS — N1831 Chronic kidney disease, stage 3a: Secondary | ICD-10-CM | POA: Diagnosis not present

## 2020-10-18 DIAGNOSIS — R079 Chest pain, unspecified: Secondary | ICD-10-CM | POA: Insufficient documentation

## 2020-10-18 DIAGNOSIS — I420 Dilated cardiomyopathy: Secondary | ICD-10-CM | POA: Diagnosis not present

## 2020-10-18 DIAGNOSIS — Z79899 Other long term (current) drug therapy: Secondary | ICD-10-CM | POA: Diagnosis not present

## 2020-10-18 DIAGNOSIS — Z880 Allergy status to penicillin: Secondary | ICD-10-CM | POA: Insufficient documentation

## 2020-10-18 HISTORY — PX: RIGHT/LEFT HEART CATH AND CORONARY ANGIOGRAPHY: CATH118266

## 2020-10-18 LAB — POCT I-STAT 7, (LYTES, BLD GAS, ICA,H+H)
Acid-base deficit: 5 mmol/L — ABNORMAL HIGH (ref 0.0–2.0)
Bicarbonate: 20.7 mmol/L (ref 20.0–28.0)
Calcium, Ion: 1.21 mmol/L (ref 1.15–1.40)
HCT: 39 % (ref 39.0–52.0)
Hemoglobin: 13.3 g/dL (ref 13.0–17.0)
O2 Saturation: 98 %
Potassium: 3.5 mmol/L (ref 3.5–5.1)
Sodium: 134 mmol/L — ABNORMAL LOW (ref 135–145)
TCO2: 22 mmol/L (ref 22–32)
pCO2 arterial: 41.5 mmHg (ref 32.0–48.0)
pH, Arterial: 7.306 — ABNORMAL LOW (ref 7.350–7.450)
pO2, Arterial: 117 mmHg — ABNORMAL HIGH (ref 83.0–108.0)

## 2020-10-18 LAB — POCT I-STAT EG7
Acid-base deficit: 1 mmol/L (ref 0.0–2.0)
Acid-base deficit: 1 mmol/L (ref 0.0–2.0)
Bicarbonate: 24.9 mmol/L (ref 20.0–28.0)
Bicarbonate: 25.1 mmol/L (ref 20.0–28.0)
Calcium, Ion: 1.23 mmol/L (ref 1.15–1.40)
Calcium, Ion: 1.26 mmol/L (ref 1.15–1.40)
HCT: 41 % (ref 39.0–52.0)
HCT: 41 % (ref 39.0–52.0)
Hemoglobin: 13.9 g/dL (ref 13.0–17.0)
Hemoglobin: 13.9 g/dL (ref 13.0–17.0)
O2 Saturation: 69 %
O2 Saturation: 71 %
Potassium: 3.8 mmol/L (ref 3.5–5.1)
Potassium: 3.9 mmol/L (ref 3.5–5.1)
Sodium: 144 mmol/L (ref 135–145)
Sodium: 144 mmol/L (ref 135–145)
TCO2: 26 mmol/L (ref 22–32)
TCO2: 26 mmol/L (ref 22–32)
pCO2, Ven: 43.8 mmHg — ABNORMAL LOW (ref 44.0–60.0)
pCO2, Ven: 44.3 mmHg (ref 44.0–60.0)
pH, Ven: 7.362 (ref 7.250–7.430)
pH, Ven: 7.362 (ref 7.250–7.430)
pO2, Ven: 38 mmHg (ref 32.0–45.0)
pO2, Ven: 39 mmHg (ref 32.0–45.0)

## 2020-10-18 LAB — GLUCOSE, CAPILLARY
Glucose-Capillary: 134 mg/dL — ABNORMAL HIGH (ref 70–99)
Glucose-Capillary: 103 mg/dL — ABNORMAL HIGH (ref 70–99)

## 2020-10-18 SURGERY — RIGHT/LEFT HEART CATH AND CORONARY ANGIOGRAPHY
Anesthesia: LOCAL

## 2020-10-18 MED ORDER — HEPARIN (PORCINE) IN NACL 1000-0.9 UT/500ML-% IV SOLN
INTRAVENOUS | Status: DC | PRN
Start: 2020-10-18 — End: 2020-10-18
  Administered 2020-10-18: 500 mL

## 2020-10-18 MED ORDER — SODIUM CHLORIDE 0.9 % IV SOLN: INTRAVENOUS | Status: AC

## 2020-10-18 MED ORDER — SODIUM CHLORIDE 0.9% FLUSH: 3.0000 mL | Freq: Two times a day (BID) | INTRAVENOUS | Status: DC

## 2020-10-18 MED ORDER — MIDAZOLAM HCL 2 MG/2ML IJ SOLN
INTRAMUSCULAR | Status: AC
  Filled 2020-10-18: qty 2

## 2020-10-18 MED ORDER — HYDRALAZINE HCL 20 MG/ML IJ SOLN: 10.0000 mg | INTRAMUSCULAR | Status: DC | PRN

## 2020-10-18 MED ORDER — LIDOCAINE HCL (PF) 1 % IJ SOLN
INTRAMUSCULAR | Status: DC | PRN
  Administered 2020-10-18 (×2): 15 mL via INTRADERMAL
  Administered 2020-10-18: 2 mL

## 2020-10-18 MED ORDER — ACETAMINOPHEN 325 MG PO TABS: 650.0000 mg | ORAL_TABLET | ORAL | Status: DC | PRN

## 2020-10-18 MED ORDER — SODIUM CHLORIDE 0.9 % WEIGHT BASED INFUSION
3.0000 mL/kg/h | INTRAVENOUS | Status: AC
  Administered 2020-10-18: 3 mL/kg/h via INTRAVENOUS

## 2020-10-18 MED ORDER — VERAPAMIL HCL 2.5 MG/ML IV SOLN: INTRAVENOUS | Status: DC | PRN

## 2020-10-18 MED ORDER — DIAZEPAM 2 MG PO TABS: 2.0000 mg | ORAL_TABLET | Freq: Four times a day (QID) | ORAL | Status: DC | PRN

## 2020-10-18 MED ORDER — SODIUM CHLORIDE 0.9 % IV SOLN: 250.0000 mL | INTRAVENOUS | Status: DC | PRN

## 2020-10-18 MED ORDER — ATORVASTATIN CALCIUM 80 MG PO TABS: 80.0000 mg | ORAL_TABLET | Freq: Every day | ORAL | Status: DC

## 2020-10-18 MED ORDER — ONDANSETRON HCL 4 MG/2ML IJ SOLN: 4.0000 mg | Freq: Four times a day (QID) | INTRAMUSCULAR | Status: DC | PRN

## 2020-10-18 MED ORDER — SODIUM CHLORIDE 0.9 % WEIGHT BASED INFUSION: 1.0000 mL/kg/h | INTRAVENOUS | Status: DC

## 2020-10-18 MED ORDER — VERAPAMIL HCL 2.5 MG/ML IV SOLN
INTRAVENOUS | Status: AC
  Filled 2020-10-18: qty 2

## 2020-10-18 MED ORDER — FENTANYL CITRATE (PF) 100 MCG/2ML IJ SOLN
INTRAMUSCULAR | Status: DC | PRN
  Administered 2020-10-18: 12.5 ug via INTRAVENOUS

## 2020-10-18 MED ORDER — FENTANYL CITRATE (PF) 100 MCG/2ML IJ SOLN
INTRAMUSCULAR | Status: AC
  Filled 2020-10-18: qty 2

## 2020-10-18 MED ORDER — LABETALOL HCL 5 MG/ML IV SOLN: 10.0000 mg | INTRAVENOUS | Status: DC | PRN

## 2020-10-18 MED ORDER — SODIUM CHLORIDE 0.9% FLUSH: 3.0000 mL | INTRAVENOUS | Status: DC | PRN

## 2020-10-18 MED ORDER — HEPARIN (PORCINE) IN NACL 1000-0.9 UT/500ML-% IV SOLN
INTRAVENOUS | Status: DC | PRN
  Administered 2020-10-18 (×2): 500 mL

## 2020-10-18 MED ORDER — LIDOCAINE HCL (PF) 1 % IJ SOLN
INTRAMUSCULAR | Status: AC
  Filled 2020-10-18: qty 30

## 2020-10-18 MED ORDER — HEPARIN (PORCINE) IN NACL 1000-0.9 UT/500ML-% IV SOLN
INTRAVENOUS | Status: AC
  Filled 2020-10-18: qty 1000

## 2020-10-18 MED ORDER — IOHEXOL 350 MG/ML SOLN
INTRAVENOUS | Status: DC | PRN
  Administered 2020-10-18: 50 mL

## 2020-10-18 MED ORDER — ASPIRIN 81 MG PO CHEW: 81.0000 mg | CHEWABLE_TABLET | ORAL | Status: DC

## 2020-10-18 MED ORDER — MIDAZOLAM HCL 2 MG/2ML IJ SOLN
INTRAMUSCULAR | Status: DC | PRN
  Administered 2020-10-18: 1 mg via INTRAVENOUS
  Administered 2020-10-18: 0.5 mg via INTRAVENOUS

## 2020-10-18 SURGICAL SUPPLY — 25 items
BAG SNAP BAND KOVER 36X36 (MISCELLANEOUS) ×1 IMPLANT
CATH BALLN WEDGE 5F 110CM (CATHETERS) ×1 IMPLANT
CATH INFINITI 5FR MULTPACK ANG (CATHETERS) ×1 IMPLANT
CATH INFINITI MULTIPACK ANG 4F (CATHETERS) ×2 IMPLANT
CATH SWAN GANZ 7F STRAIGHT (CATHETERS) ×1 IMPLANT
COVER DOME SNAP 22 D (MISCELLANEOUS) ×1 IMPLANT
DEVICE TORQUE H2O (MISCELLANEOUS) ×1 IMPLANT
GLIDESHEATH SLEND SS 6F .021 (SHEATH) ×1 IMPLANT
GUIDEWIRE .025 260CM (WIRE) ×1 IMPLANT
GUIDEWIRE ANGLED .035X150CM (WIRE) ×1 IMPLANT
GUIDEWIRE INQWIRE 1.5J.035X260 (WIRE) IMPLANT
INQWIRE 1.5J .035X260CM (WIRE) ×2
KIT HEART LEFT (KITS) ×2 IMPLANT
PACK CARDIAC CATHETERIZATION (CUSTOM PROCEDURE TRAY) ×2 IMPLANT
PINNACLE LONG 6F 25CM (SHEATH) ×2
SHEATH GLIDE SLENDER 4/5FR (SHEATH) ×2 IMPLANT
SHEATH INTRO PINNACLE 6F 25CM (SHEATH) IMPLANT
SHEATH PINNACLE 5F 10CM (SHEATH) ×1 IMPLANT
SHEATH PINNACLE 6F 10CM (SHEATH) ×1 IMPLANT
SHEATH PINNACLE 7F 10CM (SHEATH) ×1 IMPLANT
SHEATH PROBE COVER 6X72 (BAG) ×2 IMPLANT
TRANSDUCER W/STOPCOCK (MISCELLANEOUS) ×2 IMPLANT
TUBING CIL FLEX 10 FLL-RA (TUBING) ×2 IMPLANT
WIRE EMERALD 3MM-J .025X260CM (WIRE) ×1 IMPLANT
WIRE EMERALD 3MM-J .035X150CM (WIRE) ×1 IMPLANT

## 2020-10-18 NOTE — Progress Notes (Signed)
Patient and wife was given discharge instructions. Both verbalized understanding. 

## 2020-10-18 NOTE — Progress Notes (Signed)
Site area: Right groin a 5 french arterial and 7 french venous sheath was removed  Site Prior to Removal:  Level 0  Pressure Applied For 20 MINUTES    Minutes Beginning at 1415pm X 4 hours  Manual:   Yes.    Patient Status During Pull:  Stable  Post Pull Groin Site:  Level 0  Post Pull Instructions Given:  Yes.    Post Pull Pulses Present:  Yes.    Dressing Applied:  Yes.    Comments:

## 2020-10-18 NOTE — Discharge Instructions (Signed)

## 2020-10-18 NOTE — Interval H&P Note (Signed)
Cath Lab Visit (complete for each Cath Lab visit)  Clinical Evaluation Leading to the Procedure:   ACS: No.  Non-ACS:    Anginal Classification: CCS III  Anti-ischemic medical therapy: Maximal Therapy (2 or more classes of medications)  Non-Invasive Test Results: High-risk stress test findings: cardiac mortality >3%/year  Prior CABG: No previous CABG      History and Physical Interval Note:  10/18/2020 11:03 AM  Edward Suarez  has presented today for surgery, with the diagnosis of heart failure.  The various methods of treatment have been discussed with the patient and family. After consideration of risks, benefits and other options for treatment, the patient has consented to  Procedure(s): RIGHT/LEFT HEART CATH AND CORONARY ANGIOGRAPHY (N/A) as a surgical intervention.  The patient's history has been reviewed, patient examined, no change in status, stable for surgery.  I have reviewed the patient's chart and labs.  Questions were answered to the patient's satisfaction.     Nicki Guadalajara

## 2020-10-19 ENCOUNTER — Encounter (HOSPITAL_COMMUNITY): Payer: Self-pay | Admitting: Cardiovascular Disease

## 2020-10-19 MED FILL — Verapamil HCl IV Soln 2.5 MG/ML: INTRAVENOUS | Qty: 2 | Status: AC

## 2020-10-31 ENCOUNTER — Telehealth: Payer: Self-pay | Admitting: Pharmacist

## 2020-10-31 NOTE — Telephone Encounter (Signed)
Called pt and left message to confirm meds in new pill pack he should have received from pharmacy. He should have recently finished his hydralazine and started on losartan (changed for CHF benefit). Will discuss home BP readings and will need f/u BMET and BP check in about 2 weeks. Pt has scheduled f/u with Dr Mayford Knife tomorrow.

## 2020-11-01 ENCOUNTER — Encounter: Payer: Self-pay | Admitting: Cardiology

## 2020-11-01 ENCOUNTER — Other Ambulatory Visit: Payer: Self-pay

## 2020-11-01 ENCOUNTER — Ambulatory Visit: Payer: PPO | Admitting: Cardiology

## 2020-11-01 VITALS — BP 130/68 | HR 84 | Ht 69.0 in | Wt 151.0 lb

## 2020-11-01 DIAGNOSIS — I5020 Unspecified systolic (congestive) heart failure: Secondary | ICD-10-CM

## 2020-11-01 DIAGNOSIS — I480 Paroxysmal atrial fibrillation: Secondary | ICD-10-CM

## 2020-11-01 DIAGNOSIS — E7800 Pure hypercholesterolemia, unspecified: Secondary | ICD-10-CM | POA: Diagnosis not present

## 2020-11-01 DIAGNOSIS — N1831 Chronic kidney disease, stage 3a: Secondary | ICD-10-CM

## 2020-11-01 DIAGNOSIS — I6523 Occlusion and stenosis of bilateral carotid arteries: Secondary | ICD-10-CM

## 2020-11-01 DIAGNOSIS — I442 Atrioventricular block, complete: Secondary | ICD-10-CM | POA: Diagnosis not present

## 2020-11-01 DIAGNOSIS — I10 Essential (primary) hypertension: Secondary | ICD-10-CM

## 2020-11-01 DIAGNOSIS — I2510 Atherosclerotic heart disease of native coronary artery without angina pectoris: Secondary | ICD-10-CM

## 2020-11-01 DIAGNOSIS — I2583 Coronary atherosclerosis due to lipid rich plaque: Secondary | ICD-10-CM

## 2020-11-01 MED ORDER — ENTRESTO 24-26 MG PO TABS: 1.0000 | ORAL_TABLET | Freq: Two times a day (BID) | ORAL | 0 refills | Status: DC

## 2020-11-01 NOTE — Addendum Note (Signed)
Addended by: Theresia Majors on: 11/01/2020 11:26 AM   Modules accepted: Orders

## 2020-11-01 NOTE — Patient Instructions (Signed)
Medication Instructions:  Your physician has recommended you make the following change in your medication:  1) STOP taking losartan  2) START taking Entresto 24/26 mg twice daily   *If you need a refill on your cardiac medications before your next appointment, please call your pharmacy*   Lab Work: BMET in one week  If you have labs (blood work) drawn today and your tests are completely normal, you will receive your results only by: [name] Kitchen MyChart Message (if you have MyChart) OR . A paper copy in the mail If you have any lab test that is abnormal or we need to change your treatment, we will call you to review the results.  Follow-Up: At [facility], you and your health needs are our priority.  As part of our continuing mission to provide you with exceptional heart care, we have created designated Provider Care Teams.  These Care Teams include your primary Cardiologist (physician) and Advanced Practice Providers (APPs -  Physician Assistants and Nurse Practitioners) who all work together to provide you with the care you need, when you need it.  Your next appointment:   2 month(s)  The format for your next appointment:   In Person  Provider:   You may see Armanda Magic, MD or one of the following Advanced Practice Providers on your designated Care Team:    Ronie Spies, PA-C  Jacolyn Reedy, PA-C  Other Instructions You have been referred to see our PharmD in two weeks for uptitration of your heart failure medications.

## 2020-11-01 NOTE — Progress Notes (Signed)
Date:  11/01/2020   ID:  Edward Suarez, DOB [birth year], MRN [id number]  Patient Location:  Home  Provider location:   [place]  PCP:  Renford Dills, MD  Cardiologist:  Armanda Magic, MD  Electrophysiologist:  None   Chief Complaint:  HTN, PAF, Hyperlipidemia and carotid stenosis  History of Present Illness:    Edward Suarez is a 87 y.o. male with a hx of HTN, DM, dyslipidemia,complete HB s/p PPM and PVC's,mild (1-39% bilateral)carotid artery stenosis and is followed by Dr. Darrick Penna.He also has PAF noted on pacer check in 2016 and due to [facility] score of 5, was started on Eliquis.   He was recently hospitalized in Dec 2021 with chest pain.  EKG showed paced rhythm and while in radiology had a near syncopal episode with no arrhythmia on pacer interrogation.  Cardiology was consulted and 2D echo showed moderate LV dysfunction with EF 30% with AK of all mid to apical segments and LV apex.  After discussion with patient he did not want any further inpt evaluation and was discharged home with plan to followup in office to discuss ischemic workup given normal hsTrop during hospital stay.  It was felt that RV pacing with abnormal septal motion may be exaggerating the degree of LV dysfunction.  Due to his advanced age, confusion and restlessness in hospital, he was felt not to be an ideal candidate for cath.    He underwent Lexiscan myoview 09/2020 showing EF 27% with large inferior infarct with minimal peri-infarct ischemia felt to be a high risk scan.  He underwent right and left heart cath 10/18/2020 showing 40% prox to mid RCA, 30% mid LAD and 50% mid LAD with low filling pressures and EF 30% and medical management was recommended.    He is here today for followup and is doing well.  He denies any chest pain or pressure, SOB, DOE, PND, orthopnea, LE edema, dizziness, palpitations or syncope. He is compliant with his meds and is tolerating meds with no SE.    Prior CV studies:   The  following studies were reviewed today:  Cardiac Cath 10/18/2020 Conclusion    Prox RCA to Mid RCA lesion is 40% stenosed.  Mid LAD-1 lesion is 30% stenosed.  Mid LAD-2 lesion is 50% stenosed.   Low normal right heart pressures with mean PA pressure 12 mmHg.  Mild to moderate CAD with mild diffuse 30% and focal 50% mid LAD stenoses; normal left circumflex, and 40% smooth mid RCA stenosis.  Echo documentation of a dilated cardiomyopathy with EF estimate at 30% with wall motion abnormalities as noted apically, inferiorly, and mid septally.  RECOMMENDATION: Gentle hydration post-cath particularly with the patient's stage IIIb CKD.  GDMT for reduced LV function mild to moderate CAD.  Resume Eliquis tomorrow.  Aggressive lipid-lowering therapy with target LDL less than 70.  2D echo 08/2020 IMPRESSIONS   1. Left ventricular ejection fraction, by estimation, is 30%. The left  ventricle has moderate to severely decreased function. There is akinesis  of all mid-to-apical septal segments, mid-to-apical inferior segments, and  the LV apex. The mid-to-apical  inferolateral segments are severely hypokinetic. The rest of the LV  segments are moderately hypokinetic. The left ventricular internal cavity  size was mildly dilated. There is mild concentric left ventricular  hypertrophy. Left ventricular diastolic  parameters are consistent with Grade I diastolic dysfunction (impaired  relaxation).  2. Right ventricular systolic function is normal. The right ventricular  size is normal.  3. Left  atrial size was moderately dilated.  4. The mitral valve is abnormal. Mild mitral valve regurgitation.  5. The aortic valve is tricuspid. There is mild calcification of the  aortic valve. There is mild thickening of the aortic valve. Aortic valve  regurgitation is trivial.  6. The inferior vena cava is normal in size with greater than 50%  respiratory variability, suggesting right atrial  pressure of 3 mmHg.   Comparison(s): Compared to prior echo report in 2014, the LVEF is now  moderately-to-severely reduced with regional wall motion abnormalities as  detailed above.   Past Medical History:  Diagnosis Date  . Allergic rhinitis, cause unspecified   . BPH (benign prostatic hyperplasia)   . Carotid artery stenosis, asymptomatic    1-39% by dopplers 09/2016 followed by Dr. Darrick Penna  . Cataracts, bilateral   . Chronic kidney disease (CKD), stage III (moderate) (HCC)   . Complete heart block (HCC) 01/23/04   s/p Medtronic PPM implanted by Dr Amil Amen  . COPD (chronic obstructive pulmonary disease) (HCC)   . Diabetes mellitus   . Glaucoma   . Hyperlipidemia   . Hypertension   . 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 Kidney stones   . PAF (paroxysmal atrial fibrillation) (HCC) 10/09/2014   Noted on pacer check with 88 mode switches and longest episode >1 hour.  Now on Apixiban for CHADS2VASC score of 5   Past Surgical History:  Procedure Laterality Date  . PACEMAKER INSERTION  2005, 04/16/12   MDT implanted by Dr Amil Amen with generator chnage (MDT Adapta L) by Dr Johney Frame 04/16/12  . PERMANENT PACEMAKER GENERATOR CHANGE N/A 04/16/2012   Procedure: PERMANENT PACEMAKER GENERATOR CHANGE;  Surgeon: Hillis Range, MD;  Location: [facility] CATH LAB;  Service: Cardiovascular;  Laterality: N/A;  . RIGHT/LEFT HEART CATH AND CORONARY ANGIOGRAPHY N/A 10/18/2020   Procedure: RIGHT/LEFT HEART CATH AND CORONARY ANGIOGRAPHY;  Surgeon: Lennette Bihari, MD;  Location: [facility] INVASIVE CV LAB;  Service: Cardiovascular;  Laterality: N/A;     No outpatient medications have been marked as taking for the 11/01/20 encounter (Office Visit) with Quintella Reichert, MD.     Allergies:   Penicillins   Social History   Tobacco Use  . Smoking status: Former Smoker    Packs/day: 1.00    Years: 25.00    Pack years: 25.00    Types: Cigarettes    Quit date: 09/16/1998    Years since quitting: 22.1  . Smokeless  tobacco: Never Used  Vaping Use  . Vaping Use: Never used  Substance Use Topics  . Alcohol use: Yes    Alcohol/week: 5.0 standard drinks    Types: 2 Glasses of wine, 3 Shots of liquor per week    Comment: drinks a tequila shot daily  . Drug use: No     Family Hx: The patient's family history includes Heart attack in his father; Heart disease in his father.  ROS:   Please see the history of present illness.     All other systems reviewed and are negative.   Labs/Other Tests and Data Reviewed:    Recent Labs: 10/11/2020: BUN 26; Creatinine, Ser 1.63; Platelets 231 10/18/2020: Hemoglobin 13.3; Potassium 3.5; Sodium 134   Recent Lipid Panel Lab Results  Component Value Date/Time   CHOL 128 12/16/2016 08:22 AM   TRIG 74 12/16/2016 08:22 AM   HDL 55 12/16/2016 08:22 AM   CHOLHDL 2.3 12/16/2016 08:22 AM   CHOLHDL 2.7 07/08/2016 08:02 AM   LDLCALC 58 12/16/2016  08:22 AM    Wt Readings from Last 3 Encounters:  11/01/20 151 lb (68.5 kg)  10/18/20 156 lb (70.8 kg)  10/11/20 153 lb (69.4 kg)     Objective:    Vital Signs:  BP 130/68   Pulse 84   Ht 5\' 9"  (1.753 m)   Wt 151 lb (68.5 kg)   SpO2 96%   BMI 22.30 kg/m    GEN: Well nourished, well developed in no acute distress HEENT: Normal NECK: No JVD; No carotid bruits LYMPHATICS: No lymphadenopathy CARDIAC:RRR, no murmurs, rubs, gallops RESPIRATORY:  Clear to auscultation without rales, wheezing or rhonchi  ABDOMEN: Soft, non-tender, non-distended MUSCULOSKELETAL:  No edema; No deformity  SKIN: Warm and dry NEUROLOGIC:  Alert and oriented x 3 PSYCHIATRIC:  Normal affect    ASSESSMENT & PLAN:    1.  ASCAD -he recently was hospitalized for an episode of CP with negative enzymes -2D echo with new wall motion abnormality in the mid to apical inferior wall>>EF appears worse than prior scan -recent nuclear stress test high risk -right and left heart cath 10/18/2020 showing 40% prox to mid RCA, 30% mid LAD and 50% mid  LAD with low filling pressures and EF 30% and medical management was recommended.   -no ASA due to DOAC -continue high intensity statin, long acting nitrates and BB  2.  Bilateral carotid artery stenosis - He had dopplers done 11/2018 showing 1-39% bilateral stenosis.  -continue statin -no ASA due to DOAC  3.  Complete HB  - he is s/p PPM followed in device clinic.   -device interrogation in hospital Dec 2021 after syncope in Radiology with no arrhythmia ntoed  4.  HTN  -BP well controlled on exam today -change Losartan to Entresto 24-26mg  BID -continue Toprol XL 25mg  daily -followup with PharmD in 2 weeks for uptitration of Entresto -BMET in 1 week  5.  PAF  -continues to maintain NSR with no palpitations -he denies any bleeding problems on Eliquis -HBg was 13.3 on 10/18/2020 -SCr stable 1.630 on 10/18/2020 -continue Eliquis 2.5mg  BID (dosed for age>80 and SCR on average > 1.5)  6.  Hyperlipidemia  - his LDL goal is < 70. -LDL 65 in Sept 2021  -continue on atorvastatin 80mg  daily.   7.  CKD stage 3a - his creatinine is stable at 1.4-1.63  8.  Mixed ischemic non-ischemic DCM/chronic systolic CHF -EF had been 45-50% on echo in 2014 and recently read as 30-35% -cath with mild to moderate nonobstructive CAD 10/2020 and DCM out of proportion to degree of CAD -he does not appear volume overloaded on exam today -started on Losartan and SCr stable -will try to change to Entresto 24-26mg  BID from losartan but will need to follow renal function closely -consider addition of SGLPT2 drug at time of OV with PharmD  Followup with me in 2 months   Medication Adjustments/Labs and Tests Ordered: Current medicines are reviewed at length with the patient today.  Concerns regarding medicines are outlined above.  Tests Ordered: No orders of the defined types were placed in this encounter.  Medication Changes: No orders of the defined types were placed in this encounter.   Disposition:   Follow up in 6 month(s)  Signed, [birth year]-01-1985, MD  11/01/2020 10:51 AM    [facility]

## 2020-11-06 ENCOUNTER — Telehealth: Payer: Self-pay

## 2020-11-06 NOTE — Telephone Encounter (Signed)
**Note De-Identified  Obfuscation** Entresto PA started through covermymeds. Key: [id number]

## 2020-11-08 ENCOUNTER — Other Ambulatory Visit: Payer: PPO

## 2020-11-08 NOTE — Telephone Encounter (Signed)
Approval letter has been faxed.

## 2020-11-08 NOTE — Telephone Encounter (Signed)
**Note De-Identified [name] Obfuscation** Letter received Edward Suarez fax from [employer] stating that they have approved the pts [place] PA. Approval is valid until 11/06/2021  I have notified Edward Suarez at [employer] of this approval. Per Edward Suarez  the pts Entresto still needs a PA done as she re-ran it while we were on the phone.  I offered to fax her the approval letter we received from [employer] and she did request a copy.  I have printed the letter and emailed it to Dr Malachy Mood nurse so she can fax it to [place] at [employer] as is at the fax number written on cover letter included.

## 2020-11-09 DIAGNOSIS — H10413 Chronic giant papillary conjunctivitis, bilateral: Secondary | ICD-10-CM | POA: Diagnosis not present

## 2020-11-10 DIAGNOSIS — I10 Essential (primary) hypertension: Secondary | ICD-10-CM | POA: Diagnosis not present

## 2020-11-10 DIAGNOSIS — J439 Emphysema, unspecified: Secondary | ICD-10-CM | POA: Diagnosis not present

## 2020-11-10 DIAGNOSIS — I480 Paroxysmal atrial fibrillation: Secondary | ICD-10-CM | POA: Diagnosis not present

## 2020-11-10 DIAGNOSIS — E1122 Type 2 diabetes mellitus with diabetic chronic kidney disease: Secondary | ICD-10-CM | POA: Diagnosis not present

## 2020-11-10 DIAGNOSIS — F0390 Unspecified dementia without behavioral disturbance: Secondary | ICD-10-CM | POA: Diagnosis not present

## 2020-11-10 DIAGNOSIS — N183 Chronic kidney disease, stage 3 unspecified: Secondary | ICD-10-CM | POA: Diagnosis not present

## 2020-11-10 DIAGNOSIS — N1832 Chronic kidney disease, stage 3b: Secondary | ICD-10-CM | POA: Diagnosis not present

## 2020-11-10 DIAGNOSIS — E039 Hypothyroidism, unspecified: Secondary | ICD-10-CM | POA: Diagnosis not present

## 2020-11-10 DIAGNOSIS — E7800 Pure hypercholesterolemia, unspecified: Secondary | ICD-10-CM | POA: Diagnosis not present

## 2020-11-10 DIAGNOSIS — E782 Mixed hyperlipidemia: Secondary | ICD-10-CM | POA: Diagnosis not present

## 2020-11-16 ENCOUNTER — Ambulatory Visit (INDEPENDENT_AMBULATORY_CARE_PROVIDER_SITE_OTHER): Payer: PPO | Admitting: Pharmacist

## 2020-11-16 ENCOUNTER — Encounter: Payer: Self-pay | Admitting: Pharmacist

## 2020-11-16 ENCOUNTER — Other Ambulatory Visit: Payer: Self-pay

## 2020-11-16 VITALS — BP 80/56 | HR 72

## 2020-11-16 DIAGNOSIS — I5020 Unspecified systolic (congestive) heart failure: Secondary | ICD-10-CM

## 2020-11-16 DIAGNOSIS — E0849 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: Secondary | ICD-10-CM | POA: Diagnosis not present

## 2020-11-16 NOTE — Progress Notes (Deleted)
Patient ID: Edward Suarez                 DOB: 03-09-1932                      MRN: [id number]     HPI: Edward Suarez is a 87 y.o. male referred by Dr. Mayford Knife for CHF management. PMH is significant forHTN, DM, dyslipidemia,complete HB s/p PPM and PVC's,mild (1-39% bilateral)carotid artery stenosis and is followed by Dr. Darrick Penna.He also has PAF noted on pacer check in 2016 and due to [facility] score of 5, was started on Eliquis.     Current HTN meds: Entresto 24-26, imdur 30mg , metoprolol succinate 25mg  daily Previously tried:  BP goal:   Family History: father died at 62 from cardiac issues  Social History: rarely  Diet: bologna, ham.  3x a times, fruits/  Does not like vegetables  Exercise: try to walk a mile a day  Home BP readings:   Wt Readings from Last 3 Encounters:  11/01/20 151 lb (68.5 kg)  10/18/20 156 lb (70.8 kg)  10/11/20 153 lb (69.4 kg)   BP Readings from Last 3 Encounters:  11/01/20 130/68  10/18/20 (!) 151/81  10/11/20 132/68   Pulse Readings from Last 3 Encounters:  11/01/20 84  10/18/20 72  10/11/20 77    Renal function: CrCl cannot be calculated (Patient's most recent lab result is older than the maximum 21 days allowed.).  Past Medical History:  Diagnosis Date  . Allergic rhinitis, cause unspecified   . BPH (benign prostatic hyperplasia)   . Carotid artery stenosis, asymptomatic    1-39% by dopplers 09/2016 followed by Dr. 10/13/20  . Cataracts, bilateral   . Chronic kidney disease (CKD), stage III (moderate) (HCC)   . Complete heart block (HCC) 01/23/04   s/p Medtronic PPM implanted by Dr Darrick Penna  . COPD (chronic obstructive pulmonary disease) (HCC)   . Diabetes mellitus   . Glaucoma   . Hyperlipidemia   . Hypertension   . 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 Kidney stones   . PAF (paroxysmal atrial fibrillation) (HCC) 10/09/2014   Noted on pacer check with 88 mode switches and longest episode >1 hour.  Now on Apixiban for  CHADS2VASC score of 5    Current Outpatient Medications on File Prior to Visit  Medication Sig Dispense Refill  . apixaban (ELIQUIS) 2.5 MG TABS tablet Take 1 tablet (2.5 mg total) by mouth 2 (two) times daily. 180 tablet 1  . atorvastatin (LIPITOR) 80 MG tablet TAKE 1 TABLET (80 MG TOTAL) BY MOUTH EVERY EVENING. 90 tablet 2  . Calcium Carb-Cholecalciferol (CALCIUM 600 + D PO) Take 1 tablet by mouth daily. 10 mcg vit d    . cetirizine (ZYRTEC) 10 MG tablet Take 10 mg by mouth daily as needed for allergies. For allergies. Takes prn    . chlorhexidine (PERIDEX) 0.12 % solution as needed.    . donepezil (ARICEPT) 10 MG tablet TAKE ONE TABLET BY MOUTH EVERY EVENING 90 tablet 3  . fluticasone (FLONASE) 50 MCG/ACT nasal spray Place 2 sprays into both nostrils daily as needed for allergies or rhinitis.    08/12/2014 glipiZIDE (GLUCOTROL) 5 MG tablet Take 5 mg by mouth daily before breakfast.    . isosorbide mononitrate (IMDUR) 30 MG 24 hr tablet Take 0.5 tablets (15 mg total) by mouth daily. 45 tablet 3  . levothyroxine (SYNTHROID, LEVOTHROID) 50 MCG tablet Take 50 mcg by mouth  daily before breakfast.   3  . memantine (NAMENDA) 10 MG tablet Take 1 tablet (10 mg total) by mouth 2 (two) times daily. 180 tablet 3  . metoprolol succinate (TOPROL XL) 25 MG 24 hr tablet Take 1 tablet (25 mg total) by mouth daily. 30 tablet 11  . nitroGLYCERIN (NITROSTAT) 0.4 MG SL tablet Place 1 tablet (0.4 mg total) under the tongue every 5 (five) minutes as needed for chest pain. 14 tablet 0  . pioglitazone (ACTOS) 45 MG tablet Take 45 mg by mouth daily.    . sacubitril-valsartan (ENTRESTO) 24-26 MG Take 1 tablet by mouth 2 (two) times daily. 60 tablet 0   No current facility-administered medications on file prior to visit.    Allergies  Allergen Reactions  . Penicillins Rash    Reaction: Childhood     Assessment/Plan:  1. Hypertension -

## 2020-11-16 NOTE — Progress Notes (Signed)
Patient ID: Edward Suarez                 DOB: 07-09-1932                      MRN: [id number]     HPI: Edward Suarez is a 87 y.o. male referred by Dr. Mayford Knife for CHF management. PMH is significant for HTN, T2DM, dyslipidemia,complete HB s/p PPM and PVC's,mild (1-39% bilateral)carotid artery stenosis and is followed by Dr. Darrick Penna.He also has PAF noted on pacer check in 2016 and due to [facility] score of 5, was started on Eliquis.   Patient hospitalized in [facility] 2021 for chest pain and EKG revelaed EF of 30%.  Saw Dr Mayford Knife on 11/01/20 and losartan was discontinued and patient was started on Entresto and referred to PharmD for titration.  Patient presents today with wife who answers questions due to patient's mental status changes. The couple lives in [facility] and their medications are delivered pre packaged so she is not sure which med is which but reports he is compliant.  Owns a BP cuff but has not been using it frequently.  Last used on Monday and had readings in 80s/50s.  Patient does not check blood sugar at home.  Wife does most of cooking or he eats bologna and ham in the cafeteria. Does not like vegetables but she is able to get him to eat fruit.  They walk at least 1 mile every day.  Does not add salt to food.  Drinks alcohol very rarely.  Patient denies SOB, dizziness, chest pain, headaches. Has no swelling. Is not weighing himself but they own a scale.    Current HTN meds: Entresto 24-26, imdur 30mg , metoprolol succinate 25mg  daily Previously tried: amlodipine 5mg , fosinopril 40mg , losartan 25mg ,  BP goal: <130/80  Family History: father died at 9 from cardiac issues  Exercise: try to walk a mile a day  Home BP readings:  n/a  Wt Readings from Last 3 Encounters:  11/01/20 151 lb (68.5 kg)  10/18/20 156 lb (70.8 kg)  10/11/20 153 lb (69.4 kg)   BP Readings from Last 3 Encounters:  11/01/20 130/68  10/18/20 (!) 151/81  10/11/20 132/68   Pulse Readings from  Last 3 Encounters:  11/01/20 84  10/18/20 72  10/11/20 77    Renal function: CrCl cannot be calculated (Patient's most recent lab result is older than the maximum 21 days allowed.).  Past Medical History:  Diagnosis Date  . Allergic rhinitis, cause unspecified   . BPH (benign prostatic hyperplasia)   . Carotid artery stenosis, asymptomatic    1-39% by dopplers 09/2016 followed by Dr. 10/13/20  . Cataracts, bilateral   . Chronic kidney disease (CKD), stage III (moderate) (HCC)   . Complete heart block (HCC) 01/23/04   s/p Medtronic PPM implanted by Dr 12/16/20  . COPD (chronic obstructive pulmonary disease) (HCC)   . Diabetes mellitus   . Glaucoma   . Hyperlipidemia   . Hypertension   . 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 Kidney stones   . PAF (paroxysmal atrial fibrillation) (HCC) 10/09/2014   Noted on pacer check with 88 mode switches and longest episode >1 hour.  Now on Apixiban for CHADS2VASC score of 5    Current Outpatient Medications on File Prior to Visit  Medication Sig Dispense Refill  . apixaban (ELIQUIS) 2.5 MG TABS tablet Take 1 tablet (2.5 mg total) by mouth 2 (two) times daily.  180 tablet 1  . atorvastatin (LIPITOR) 80 MG tablet TAKE 1 TABLET (80 MG TOTAL) BY MOUTH EVERY EVENING. 90 tablet 2  . Calcium Carb-Cholecalciferol (CALCIUM 600 + D PO) Take 1 tablet by mouth daily. 10 mcg vit d    . cetirizine (ZYRTEC) 10 MG tablet Take 10 mg by mouth daily as needed for allergies. For allergies. Takes prn    . chlorhexidine (PERIDEX) 0.12 % solution as needed.    . donepezil (ARICEPT) 10 MG tablet TAKE ONE TABLET BY MOUTH EVERY EVENING 90 tablet 3  . fluticasone (FLONASE) 50 MCG/ACT nasal spray Place 2 sprays into both nostrils daily as needed for allergies or rhinitis.    [name] Kitchen glipiZIDE (GLUCOTROL) 5 MG tablet Take 5 mg by mouth daily before breakfast.    . isosorbide mononitrate (IMDUR) 30 MG 24 hr tablet Take 0.5 tablets (15 mg total) by mouth daily. 45 tablet 3  .  levothyroxine (SYNTHROID, LEVOTHROID) 50 MCG tablet Take 50 mcg by mouth daily before breakfast.   3  . memantine (NAMENDA) 10 MG tablet Take 1 tablet (10 mg total) by mouth 2 (two) times daily. 180 tablet 3  . metoprolol succinate (TOPROL XL) 25 MG 24 hr tablet Take 1 tablet (25 mg total) by mouth daily. 30 tablet 11  . nitroGLYCERIN (NITROSTAT) 0.4 MG SL tablet Place 1 tablet (0.4 mg total) under the tongue every 5 (five) minutes as needed for chest pain. 14 tablet 0  . pioglitazone (ACTOS) 45 MG tablet Take 45 mg by mouth daily.    . sacubitril-valsartan (ENTRESTO) 24-26 MG Take 1 tablet by mouth 2 (two) times daily. 60 tablet 0   No current facility-administered medications on file prior to visit.    Allergies  Allergen Reactions  . Penicillins Rash    Reaction: Childhood     Assessment/Plan:  1. CHF - Patient blood pressure in room today 80/56 which is similar to wife's home reading and well below goal of <130/80.  Patient is asymptomatic. Feels no dizziness or sluggishness.  Difficult to assess any mental status changes due to patient's dementia.    Briefly discussed pathophysiology of HFrEF and showed educational materials so wife could understand.   Unable to titrate Entresto, isosorbide, or metoprolol at this time due to patient's low BP.  Is due for BMP.  On patient;s med list has glipizide and pioglitazone. Pioglitazone is not recommended for use in patient's with heart failure and concern regarding patient blood sugar dropping too low with glipizide since he is not checking his home blood sugar.  Will order A1c.  Considering d/c both and add on Jardiance/Farxiga for HF and DM benefit.  Instructed patient's wife to begin checking his BP at home and recording logs and to call if he remains hypotensive.  Also gave instructions to weigh daily and report back with weight gains of >3# overnight or 5# in a week.  Wife voiced understanding.  Continue Entresto 24-24 BID Continue Imdur  15mg  daily Continue metoprolol succinate 25mg  daily Discontinue pioglitazone 45mg  for now Recheck in 3 weeks  , PharmD, BCACP, CDCES, CPP [facility] [address], [place], [facility] [address] Phone: [phone]; Fax: [phone] 11/16/2020 12:39 PM

## 2020-11-16 NOTE — Patient Instructions (Addendum)
It was very nice meeting you two today!  We would like to keep your blood pressure less than 130/80 Continue to monitor your blood pressure at home  Weigh yourself daily and call if you gain more than 3 pounds overnight or 5 pounds in a week  We will continue the same medications right now: Entresto 24-26 twice a day Metoprolol 25mg  once a day Isosorbide 30mg  (1/2 tablet once a day)  Please discontinue your pioglitazone at this time.  We will check your hemoglobin A1c today to see how your blood sugar is doing  Please call with any questions   , PharmD, BCACP, CDCES, CPP [facility] [address] [place], [address] [place] Phone: [phone]; Fax: [phone] 11/16/2020 11:32 AM

## 2020-11-17 LAB — BASIC METABOLIC PANEL
BUN/Creatinine Ratio: 19 (ref 10–24)
BUN: 31 mg/dL — ABNORMAL HIGH (ref 8–27)
CO2: 22 mmol/L (ref 20–29)
Calcium: 9.3 mg/dL (ref 8.6–10.2)
Chloride: 105 mmol/L (ref 96–106)
Creatinine, Ser: 1.66 mg/dL — ABNORMAL HIGH (ref 0.76–1.27)
Glucose: 258 mg/dL — ABNORMAL HIGH (ref 65–99)
Potassium: 4.6 mmol/L (ref 3.5–5.2)
Sodium: 141 mmol/L (ref 134–144)
eGFR: 40 mL/min/{1.73_m2} — ABNORMAL LOW (ref >59)

## 2020-11-17 LAB — HEMOGLOBIN A1C
Est. average glucose Bld gHb Est-mCnc: 146 mg/dL
Hgb A1c MFr Bld: 6.7 % — ABNORMAL HIGH (ref 4.8–5.6)

## 2020-11-21 ENCOUNTER — Telehealth: Payer: Self-pay | Admitting: Pharmacist

## 2020-11-21 DIAGNOSIS — E0849 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: Secondary | ICD-10-CM

## 2020-11-21 DIAGNOSIS — I5020 Unspecified systolic (congestive) heart failure: Secondary | ICD-10-CM

## 2020-11-21 MED ORDER — DAPAGLIFLOZIN PROPANEDIOL 10 MG PO TABS: ORAL_TABLET | ORAL | 1 refills | Status: DC

## 2020-11-21 NOTE — Telephone Encounter (Signed)
Called patient's wife again, no answer left message on machine

## 2020-11-21 NOTE — Telephone Encounter (Signed)
Spoke with wife.  Took husband's BP over the phone: 124/64.  Recommended she continue Entresto and will add Farxiga at this time for CHF and DM benefit.  Has already discontinued pioglitazone and will discontinue glipizide at this time due to patient's age, A1c, and risks of hypoglycemia.  Wife voiced understanding.

## 2020-11-22 ENCOUNTER — Telehealth: Payer: Self-pay | Admitting: Cardiology

## 2020-11-22 NOTE — Telephone Encounter (Signed)
    Pt c/o medication issue:  1. Name of Medication: dapagliflozin propanediol (FARXIGA) 10 MG TABS tablet  2. How are you currently taking this medication (dosage and times per day)? Take one tablet by mouth in the morning  3. Are you having a reaction (difficulty breathing--STAT)?   4. What is your medication issue? [facility] called, she said the Marcelline Deist is a non formulary drug it is not covered by pt's insurance

## 2020-11-22 NOTE — Telephone Encounter (Addendum)
Received call from [facility] pharmacy clarifying patient's prescriptions.  Looks like there was a note to pharmacy on Farxiga rx to cancel pioglitazone and glipizide rx. Confirmed that pill pack service should include Marcelline Deist and Entresto, and that pt has discontinued losartan, pioglitazone, and glipizide which will be removed from patient's pill pack service.

## 2020-11-23 ENCOUNTER — Other Ambulatory Visit: Payer: Self-pay

## 2020-11-23 MED ORDER — EMPAGLIFLOZIN 10 MG PO TABS: 10.0000 mg | ORAL_TABLET | Freq: Every day | ORAL | 11 refills | Status: DC

## 2020-11-23 NOTE — Telephone Encounter (Signed)
Called the pharmacy and changed the pt for farxiga to jardiance rx sent and verified w/pharmacy to dc farxiga and glipizide.

## 2020-11-23 NOTE — Telephone Encounter (Signed)
See previous phone note.  

## 2020-11-24 ENCOUNTER — Telehealth: Payer: Self-pay | Admitting: Pharmacist

## 2020-11-24 NOTE — Telephone Encounter (Signed)
Patient's wife called and leftVm stating that Edward Suarez was going to cost $240 which is cost prohibitive. After reviewing last OV note, I am concerned about patients blood pressure of 80/56. Jardiance does not lower blood pressure a lot, but can lower it some. I do not think it would be a good idea to add on when blood pressure is already very low. I called wife back and left message on VM (per wife approval) that we will hold off on Jardiance for now. They might be in coverage gap as cost should be $90/90 or $45/30. Also do not want to add with his current blood pressure. I asked her to continue to check blood pressure at home as I would prefer his systolic BP to be at least in the 90's. Patient already has follow up scheduled for 3/23.  If in the future we decide to go with Jardiance, patient assistance paperwork could be filled out.

## 2020-11-27 DIAGNOSIS — E1122 Type 2 diabetes mellitus with diabetic chronic kidney disease: Secondary | ICD-10-CM | POA: Diagnosis not present

## 2020-11-27 DIAGNOSIS — F0390 Unspecified dementia without behavioral disturbance: Secondary | ICD-10-CM | POA: Diagnosis not present

## 2020-11-27 DIAGNOSIS — N183 Chronic kidney disease, stage 3 unspecified: Secondary | ICD-10-CM | POA: Diagnosis not present

## 2020-11-27 DIAGNOSIS — E782 Mixed hyperlipidemia: Secondary | ICD-10-CM | POA: Diagnosis not present

## 2020-11-27 DIAGNOSIS — E039 Hypothyroidism, unspecified: Secondary | ICD-10-CM | POA: Diagnosis not present

## 2020-11-27 DIAGNOSIS — I480 Paroxysmal atrial fibrillation: Secondary | ICD-10-CM | POA: Diagnosis not present

## 2020-11-27 DIAGNOSIS — J439 Emphysema, unspecified: Secondary | ICD-10-CM | POA: Diagnosis not present

## 2020-11-27 DIAGNOSIS — I10 Essential (primary) hypertension: Secondary | ICD-10-CM | POA: Diagnosis not present

## 2020-11-27 NOTE — Telephone Encounter (Signed)
Pt was changed to jardiance because farxiga wasn't covered I explained this by phone to [facility] Thursday 11/23/20

## 2020-12-05 NOTE — Progress Notes (Signed)
Patient ID: Edward Suarez                 DOB: 03-26-1932                      MRN: [id number]     HPI: Edward Suarez is a 87 y.o. male referred by Dr. Mayford Knife for CHF management. PMH is significant for HTN, T2DM, dyslipidemia,complete HB s/p PPM and PVC's,mild (1-39% bilateral)carotid artery stenosis and is followed by Dr. Darrick Penna.He also hasPAF noted on pacer check in 2016 and due to [facility] score of 5, was started on Eliquis. Patient hospitalized in [facility] 2021 for chest pain and EKG revealed EF of 30%.  Saw Dr Mayford Knife on 11/01/20, BP was 130/68, and losartan was discontinued and patient was started on Entresto and referred to PharmD for titration.   At last pharmacy visit on 11/16/20, reported home BP readings in 80s/50s. Lives with wife in [place] and their medications are delivered pre packaged, he is compliant. Wife assists with answering questions for patient and helps check BP at home due to patient's dementia. Not weighing himself but they own a scale. BP in office was 80/56. Patient is asymptomatic, no dizziness or sluggishness. Pioglitazone was discontinued due to not being recommended in HF, glipizide discontinued due to risk of hypoglycemia and patient not checking at home. A1c controlled at 6.7%. Started on SGLT2 for CHF and DM benefit. Marcelline Deist not covered and cost of Jardiance $240--suspect in donut hole since cost should be $90/90 day supply. Did not start SGLT2 due to cost and because of patient's hypotension.   Today, patient arrives in good spirits accompanied by his wife who assists with answering questions. Denies swelling, SOB, dizziness, headaches, or blurred vision. Checks BP at home with reported readings in 120s/70s-80s. Has not seen any systolic readings below 110. Does not weight at home since weights are always stable. Does not add salt to food. Regarding his diabetes regimen, he is still taking pioglitazone 45 mg daily but has stopped glipizide. They are interested  in checking his blood sugar today since they do not check it at home. Of note, he last ate lunch around 12pm (ate a ham sandwich, cherry tomatoes, cashew nuts, and mixed fruit). Reports that patient is now in the donut hole and will have to pay $6000 in medication costs to get out. Their last fill of Sherryll Burger was $45 but expect this to go up at next fill as the cost of Jardiance per phone note on 11/24/20 was going to be $240.   Current HTN meds: Entresto 24-26 mg BID, imdur 30mg  daily, metoprolol succinate 25mg  daily  Previously tried: amlodipine 5mg , fosinopril 40mg , losartan 25mg   BP goal: <130/80 mmHg  Family History: father died at 83 from cardiac issues  Diet: Wife does most of cooking or he eats bologna and ham in the cafeteria. Does not like vegetables but she is able to get him to eat fruit.  Does not add salt to food.  Drinks alcohol very rarely.  Exercise: tries to walk a mile a day  Home BP readings: 120s/70s-80s; machine always reads out that it is healthy (not too high or low)  Wt Readings from Last 3 Encounters:  11/01/20 151 lb (68.5 kg)  10/18/20 156 lb (70.8 kg)  10/11/20 153 lb (69.4 kg)   BP Readings from Last 3 Encounters:  12/06/20 128/72  11/16/20 (!) 80/56  11/01/20 130/68   Pulse Readings from Last 3  Encounters:  12/06/20 72  11/16/20 72  11/01/20 84    Renal function: CrCl cannot be calculated (Unknown ideal weight.).  Past Medical History:  Diagnosis Date  . Allergic rhinitis, cause unspecified   . BPH (benign prostatic hyperplasia)   . Carotid artery stenosis, asymptomatic    1-39% by dopplers 09/2016 followed by Dr. Darrick Penna  . Cataracts, bilateral   . Chronic kidney disease (CKD), stage III (moderate) (HCC)   . Complete heart block (HCC) 01/23/04   s/p Medtronic PPM implanted by Dr Amil Amen  . COPD (chronic obstructive pulmonary disease) (HCC)   . Diabetes mellitus   . Glaucoma   . Hyperlipidemia   . Hypertension   . Insomnia due to medical  condition 08/10/2014  . Kidney stones   . PAF (paroxysmal atrial fibrillation) (HCC) 10/09/2014   Noted on pacer check with 88 mode switches and longest episode >1 hour.  Now on Apixiban for CHADS2VASC score of 5    Current Outpatient Medications on File Prior to Visit  Medication Sig Dispense Refill  . amoxicillin (AMOXIL) 500 MG tablet Take 1,000 mg by mouth 2 (two) times daily.    [name] Kitchen apixaban (ELIQUIS) 2.5 MG TABS tablet Take 1 tablet (2.5 mg total) by mouth 2 (two) times daily. 180 tablet 1  . atorvastatin (LIPITOR) 80 MG tablet TAKE 1 TABLET (80 MG TOTAL) BY MOUTH EVERY EVENING. 90 tablet 2  . Calcium Carb-Cholecalciferol (CALCIUM 600 + D PO) Take 1 tablet by mouth daily. 10 mcg vit d    . cetirizine (ZYRTEC) 10 MG tablet Take 10 mg by mouth daily as needed for allergies. For allergies. Takes prn    . chlorhexidine (PERIDEX) 0.12 % solution as needed.    . donepezil (ARICEPT) 10 MG tablet TAKE ONE TABLET BY MOUTH EVERY EVENING 90 tablet 3  . empagliflozin (JARDIANCE) 10 MG TABS tablet Take 1 tablet (10 mg total) by mouth daily before breakfast. 30 tablet 11  . fluticasone (FLONASE) 50 MCG/ACT nasal spray Place 2 sprays into both nostrils daily as needed for allergies or rhinitis.    [name] Kitchen isosorbide mononitrate (IMDUR) 30 MG 24 hr tablet Take 0.5 tablets (15 mg total) by mouth daily. 45 tablet 3  . levothyroxine (SYNTHROID, LEVOTHROID) 50 MCG tablet Take 50 mcg by mouth daily before breakfast.   3  . memantine (NAMENDA) 10 MG tablet Take 1 tablet (10 mg total) by mouth 2 (two) times daily. 180 tablet 3  . metoprolol succinate (TOPROL XL) 25 MG 24 hr tablet Take 1 tablet (25 mg total) by mouth daily. 30 tablet 11  . nitroGLYCERIN (NITROSTAT) 0.4 MG SL tablet Place 1 tablet (0.4 mg total) under the tongue every 5 (five) minutes as needed for chest pain. 14 tablet 0  . prednisoLONE acetate (PRED FORTE) 1 % ophthalmic suspension Place 1 drop into both eyes in the morning and at bedtime.    .  sacubitril-valsartan (ENTRESTO) 24-26 MG Take 1 tablet by mouth 2 (two) times daily. 60 tablet 0   No current facility-administered medications on file prior to visit.    Allergies  Allergen Reactions  . Penicillins Rash    Reaction: Childhood     Assessment/Plan:   1. CHF - Blood pressure is at goal <130/80 mmHg with reading of 128/72, which correlates with home readings and is much improved from previous hypotensive readings of 80s/50s at last visit. Will continue current medications: Entresto 24-26 mg BID, Imdur 30 mg daily, and metoprolol succinate 25 mg daily.  With improved BP readings, patient could tolerate addition of SGLT2 inhibitor to benefit both his heart failure and diabetes. As patient is in the donut hole, will submit patient assistance applications for Entresto and Jardiance. Since he is about halfway through his 30 day supply of Entresto, provided patient with 14 day sample of Entresto while waiting to hear back from assistance applications. Regarding his diabetes regimen, will continue pioglitazone for now. If Jardiance patient assistance is approved, will plan to discontinue pioglitazone due to not being recommended in heart failure and A1c lowering effect of Jardiance. Will check a BMET today at patient/wife's request to check blood glucose. Will call with updates on patient assistance program applications and schedule follow up at that time.   Pervis Hocking, PharmD PGY1 Pharmacy Resident 12/06/2020 4:18 PM

## 2020-12-06 ENCOUNTER — Other Ambulatory Visit: Payer: Self-pay

## 2020-12-06 ENCOUNTER — Ambulatory Visit (INDEPENDENT_AMBULATORY_CARE_PROVIDER_SITE_OTHER): Payer: PPO | Admitting: Student-PharmD

## 2020-12-06 VITALS — BP 128/72 | HR 72 | Wt 150.6 lb

## 2020-12-06 DIAGNOSIS — I5020 Unspecified systolic (congestive) heart failure: Secondary | ICD-10-CM

## 2020-12-06 MED ORDER — ENTRESTO 24-26 MG PO TABS: 1.0000 | ORAL_TABLET | Freq: Two times a day (BID) | ORAL | 0 refills | Status: DC

## 2020-12-06 NOTE — Patient Instructions (Signed)
It was nice to see you today!  Your goal blood pressure is less than 130/80 mmHg. In clinic, your blood pressure was 128/72 mmHg.  Medication Changes: For your heart failure, continue Entresto 24-26 mg twice daily, Imdur 30 mg once daily, and metoprolol succinate 25 mg daily  For your diabetes, continue pioglitazone for now  We are submitting patient assistance applications to hopefully bring the cost down for Entresto and a new medication called Jardiance. [place] Pepper will also help with diabetes, so if we can get this approved we would be able to stop pioglitazone in the future.   We are checking a basic metabolic panel today that will tell us your blood sugar. I will call you with these results tomorrow.   Monitor blood pressure at home daily and keep a log (on your phone or piece of paper) to bring with you to your next visit. Write down date, time, blood pressure and pulse.   Keep up the good work with diet and exercise. Aim for a diet full of vegetables, fruit and lean meats (chicken, [place], fish). Try to limit salt intake by eating fresh or frozen vegetables (instead of canned), rinse canned vegetables prior to cooking and do not add any additional salt to meals.   Please give [place] a call at [phone] with any questions or concerns.

## 2020-12-07 ENCOUNTER — Telehealth: Payer: Self-pay | Admitting: Pharmacist

## 2020-12-07 LAB — BASIC METABOLIC PANEL
BUN/Creatinine Ratio: 22 (ref 10–24)
BUN: 37 mg/dL — ABNORMAL HIGH (ref 8–27)
CO2: 21 mmol/L (ref 20–29)
Calcium: 9.1 mg/dL (ref 8.6–10.2)
Chloride: 109 mmol/L — ABNORMAL HIGH (ref 96–106)
Creatinine, Ser: 1.69 mg/dL — ABNORMAL HIGH (ref 0.76–1.27)
Glucose: 72 mg/dL (ref 65–99)
Potassium: 5.1 mmol/L (ref 3.5–5.2)
Sodium: 146 mmol/L — ABNORMAL HIGH (ref 134–144)
eGFR: 39 mL/min/{1.73_m2} — ABNORMAL LOW (ref >59)

## 2020-12-07 NOTE — Telephone Encounter (Signed)
Pt denied Jardiance pt assistance due to income, will submit for [place] assistance instead.

## 2020-12-12 ENCOUNTER — Ambulatory Visit (INDEPENDENT_AMBULATORY_CARE_PROVIDER_SITE_OTHER): Payer: PPO

## 2020-12-12 DIAGNOSIS — I442 Atrioventricular block, complete: Secondary | ICD-10-CM | POA: Diagnosis not present

## 2020-12-12 NOTE — Telephone Encounter (Addendum)
Pt denied Entresto patient assistance due to income, however called for status update on Farxiga and was advised that pt was approved through 09/15/21. Tried calling pt's wife to discuss, phone # not working.

## 2020-12-13 ENCOUNTER — Other Ambulatory Visit: Payer: Self-pay | Admitting: Cardiology

## 2020-12-13 LAB — CUP PACEART REMOTE DEVICE CHECK
Battery Impedance: 2256 Ohm
Battery Remaining Longevity: 20 mo
Battery Voltage: 2.73 V
Brady Statistic AP VP Percent: 95 %
Brady Statistic AP VS Percent: 0 %
Brady Statistic AS VP Percent: 5 %
Brady Statistic AS VS Percent: 0 %
Date Time Interrogation Session: 20220329161426
Implantable Lead Implant Date: 20050509
Implantable Lead Implant Date: 20050509
Implantable Lead Location: 753859
Implantable Lead Location: 753860
Implantable Lead Model: 5076
Implantable Lead Model: 5092
Implantable Pulse Generator Implant Date: 20130801
Lead Channel Impedance Value: 408 Ohm
Lead Channel Impedance Value: 500 Ohm
Lead Channel Pacing Threshold Amplitude: 0.5 V
Lead Channel Pacing Threshold Amplitude: 1.25 V
Lead Channel Pacing Threshold Pulse Width: 0.4 ms
Lead Channel Pacing Threshold Pulse Width: 0.4 ms
Lead Channel Setting Pacing Amplitude: 2 V
Lead Channel Setting Pacing Amplitude: 3.25 V
Lead Channel Setting Pacing Pulse Width: 0.4 ms
Lead Channel Setting Sensing Sensitivity: 4 mV

## 2020-12-13 NOTE — Telephone Encounter (Signed)
Left voicemail requesting call back.  

## 2020-12-13 NOTE — Addendum Note (Signed)
Addended by: Dicie Beam on: 12/13/2020 04:48 PM   Modules accepted: Orders

## 2020-12-13 NOTE — Telephone Encounter (Signed)
Patient's wife Edward Suarez called back this afternoon. Informed her of Jardiance patient assistance denial, but of [place] approval. She will receive this in the mail and Edward Suarez can start taking 1 tablet once daily once they receive it. Instructed them to stop taking the pioglitazone when he starts [place] since [place] also helps diabetes and pioglitazone is not recommended in heart failure. Also informed her that [facility] patient assistance was also denied. She will see what the cost is for the next refill and call us back if it is too expensive. Edward Suarez has a visit with Dr. Mayford Knife scheduled 4/19. She thinks her husband has enough Entresto remaining to last until this appointment. If cost is prohibitive, could switch Entresto back to losartan.

## 2020-12-19 ENCOUNTER — Telehealth: Payer: Self-pay | Admitting: Pharmacist

## 2020-12-19 NOTE — Telephone Encounter (Signed)
[facility] pharmacist from [place] called today, stating that patient was not on Actos (Megan Supple stopped this previously). We were aware of this. She states that he was put on glipizide since he was not on an DM medications for a few weeks bc jardiance was denied. I advised that he has been approved for pt assistance for Fargixa and will start this when it comes in the mail. She will stop the glipizide. We did give pt 14 days worth of samples of Entresto on 12/06/20. Was denied Pt assistance for Entresto.  Per pharmacist she will communicate with [facility] that pt has samples of entresto and what medications pt should be on.

## 2020-12-26 NOTE — Progress Notes (Signed)
Remote pacemaker transmission.   

## 2021-01-02 ENCOUNTER — Telehealth: Payer: Self-pay | Admitting: Pharmacist

## 2021-01-02 ENCOUNTER — Ambulatory Visit: Payer: PPO | Admitting: Cardiology

## 2021-01-02 ENCOUNTER — Encounter: Payer: Self-pay | Admitting: Cardiology

## 2021-01-02 ENCOUNTER — Other Ambulatory Visit: Payer: Self-pay

## 2021-01-02 VITALS — BP 102/58 | HR 83 | Ht 69.0 in | Wt 150.8 lb

## 2021-01-02 DIAGNOSIS — N1831 Chronic kidney disease, stage 3a: Secondary | ICD-10-CM

## 2021-01-02 DIAGNOSIS — E7800 Pure hypercholesterolemia, unspecified: Secondary | ICD-10-CM

## 2021-01-02 DIAGNOSIS — I6523 Occlusion and stenosis of bilateral carotid arteries: Secondary | ICD-10-CM

## 2021-01-02 DIAGNOSIS — I5020 Unspecified systolic (congestive) heart failure: Secondary | ICD-10-CM | POA: Diagnosis not present

## 2021-01-02 DIAGNOSIS — I442 Atrioventricular block, complete: Secondary | ICD-10-CM

## 2021-01-02 DIAGNOSIS — I10 Essential (primary) hypertension: Secondary | ICD-10-CM | POA: Diagnosis not present

## 2021-01-02 DIAGNOSIS — I2510 Atherosclerotic heart disease of native coronary artery without angina pectoris: Secondary | ICD-10-CM | POA: Diagnosis not present

## 2021-01-02 DIAGNOSIS — I480 Paroxysmal atrial fibrillation: Secondary | ICD-10-CM

## 2021-01-02 DIAGNOSIS — I2583 Coronary atherosclerosis due to lipid rich plaque: Secondary | ICD-10-CM | POA: Diagnosis not present

## 2021-01-02 NOTE — Progress Notes (Signed)
Date:  01/02/2021   ID:  Edward Suarez, DOB Feb 06, 1932, MRN [id number]  Patient Location:  Home  Provider location:   [place]  PCP:  Renford Dills, MD  Cardiologist:  Armanda Magic, MD  Electrophysiologist:  None   Chief Complaint:  HTN, PAF, Hyperlipidemia and carotid stenosis  History of Present Illness:    Edward Suarez is a 87 y.o. male with a hx of HTN, DM, dyslipidemia,complete HB s/p PPM and PVC's,mild (1-39% bilateral)carotid artery stenosis and is followed by Dr. Darrick Penna.He also has PAF noted on pacer check in 2016 and due to [facility] score of 5, was started on Eliquis.   He was recently hospitalized in Dec 2021 with chest pain.  EKG showed paced rhythm and while in radiology had a near syncopal episode with no arrhythmia on pacer interrogation.  Cardiology was consulted and 2D echo showed moderate LV dysfunction with EF 30% with AK of all mid to apical segments and LV apex.  After discussion with patient he did not want any further inpt evaluation and was discharged home with plan to followup in office to discuss ischemic workup given normal hsTrop during hospital stay.  It was felt that RV pacing with abnormal septal motion may be exaggerating the degree of LV dysfunction.  Due to his advanced age, confusion and restlessness in hospital, he was felt not to be an ideal candidate for cath.    He underwent Lexiscan myoview 09/2020 showing EF 27% with large inferior infarct with minimal peri-infarct ischemia felt to be a high risk scan.  He underwent right and left heart cath 10/18/2020 showing 40% prox to mid RCA, 30% mid LAD and 50% mid LAD with low filling pressures and EF 30% and medical management was recommended.  His losartan was changed to [place] added.  He is here today for followup and is doing well.  He denies any chest pain or pressure, SOB, DOE, PND, orthopnea, LE edema, dizziness, palpitations or syncope. He is compliant with his meds and is  tolerating meds with no SE.    Prior CV studies:   The following studies were reviewed today:  Cardiac Cath 10/18/2020 Conclusion    Prox RCA to Mid RCA lesion is 40% stenosed.  Mid LAD-1 lesion is 30% stenosed.  Mid LAD-2 lesion is 50% stenosed.   Low normal right heart pressures with mean PA pressure 12 mmHg.  Mild to moderate CAD with mild diffuse 30% and focal 50% mid LAD stenoses; normal left circumflex, and 40% smooth mid RCA stenosis.  Echo documentation of a dilated cardiomyopathy with EF estimate at 30% with wall motion abnormalities as noted apically, inferiorly, and mid septally.  RECOMMENDATION: Gentle hydration post-cath particularly with the patient's stage IIIb CKD.  GDMT for reduced LV function mild to moderate CAD.  Resume Eliquis tomorrow.  Aggressive lipid-lowering therapy with target LDL less than 70.  2D echo 08/2020 IMPRESSIONS   1. Left ventricular ejection fraction, by estimation, is 30%. The left  ventricle has moderate to severely decreased function. There is akinesis  of all mid-to-apical septal segments, mid-to-apical inferior segments, and  the LV apex. The mid-to-apical  inferolateral segments are severely hypokinetic. The rest of the LV  segments are moderately hypokinetic. The left ventricular internal cavity  size was mildly dilated. There is mild concentric left ventricular  hypertrophy. Left ventricular diastolic  parameters are consistent with Grade I diastolic dysfunction (impaired  relaxation).  2. Right ventricular systolic function is normal. The right  ventricular  size is normal.  3. Left atrial size was moderately dilated.  4. The mitral valve is abnormal. Mild mitral valve regurgitation.  5. The aortic valve is tricuspid. There is mild calcification of the  aortic valve. There is mild thickening of the aortic valve. Aortic valve  regurgitation is trivial.  6. The inferior vena cava is normal in size with greater than 50%   respiratory variability, suggesting right atrial pressure of 3 mmHg.   Comparison(s): Compared to prior echo report in 2014, the LVEF is now  moderately-to-severely reduced with regional wall motion abnormalities as  detailed above.   Past Medical History:  Diagnosis Date  . Allergic rhinitis, cause unspecified   . BPH (benign prostatic hyperplasia)   . Carotid artery stenosis, asymptomatic    1-39% by dopplers 09/2016 followed by Dr. Darrick Penna  . Cataracts, bilateral   . Chronic kidney disease (CKD), stage III (moderate) (HCC)   . Complete heart block (HCC) 01/23/04   s/p Medtronic PPM implanted by Dr Amil Amen  . COPD (chronic obstructive pulmonary disease) (HCC)   . Diabetes mellitus   . Glaucoma   . Hyperlipidemia   . Hypertension   . 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 Kidney stones   . PAF (paroxysmal atrial fibrillation) (HCC) 10/09/2014   Noted on pacer check with 88 mode switches and longest episode >1 hour.  Now on Apixiban for CHADS2VASC score of 5   Past Surgical History:  Procedure Laterality Date  . PACEMAKER INSERTION  2005, 04/16/12   MDT implanted by Dr Amil Amen with generator chnage (MDT Adapta L) by Dr Johney Frame 04/16/12  . PERMANENT PACEMAKER GENERATOR CHANGE N/A 04/16/2012   Procedure: PERMANENT PACEMAKER GENERATOR CHANGE;  Surgeon: Hillis Range, MD;  Location: [facility] CATH LAB;  Service: Cardiovascular;  Laterality: N/A;  . RIGHT/LEFT HEART CATH AND CORONARY ANGIOGRAPHY N/A 10/18/2020   Procedure: RIGHT/LEFT HEART CATH AND CORONARY ANGIOGRAPHY;  Surgeon: Lennette Bihari, MD;  Location: [facility] INVASIVE CV LAB;  Service: Cardiovascular;  Laterality: N/A;     Current Meds  Medication Sig  . amoxicillin (AMOXIL) 500 MG tablet Take 1,000 mg by mouth 2 (two) times daily.  [name] Kitchen apixaban (ELIQUIS) 2.5 MG TABS tablet Take 1 tablet (2.5 mg total) by mouth 2 (two) times daily.  [name] Kitchen atorvastatin (LIPITOR) 80 MG tablet TAKE 1 TABLET (80 MG TOTAL) BY MOUTH EVERY EVENING.  . Calcium  Carb-Cholecalciferol (CALCIUM 600 + D PO) Take 1 tablet by mouth daily. 10 mcg vit d  . cetirizine (ZYRTEC) 10 MG tablet Take 10 mg by mouth daily as needed for allergies. For allergies. Takes prn  . chlorhexidine (PERIDEX) 0.12 % solution as needed.  . dapagliflozin propanediol (FARXIGA) 10 MG TABS tablet Take 10 mg by mouth daily.  [name] Kitchen donepezil (ARICEPT) 10 MG tablet TAKE ONE TABLET BY MOUTH EVERY EVENING  . ENTRESTO 24-26 MG TAKE ONE TABLET BY MOUTH AT BREAKFAST AND AT BEDTIME  . fluticasone (FLONASE) 50 MCG/ACT nasal spray Place 2 sprays into both nostrils daily as needed for allergies or rhinitis.  [name] Kitchen isosorbide mononitrate (IMDUR) 30 MG 24 hr tablet Take 0.5 tablets (15 mg total) by mouth daily.  [name] Kitchen levothyroxine (SYNTHROID, LEVOTHROID) 50 MCG tablet Take 50 mcg by mouth daily before breakfast.   . memantine (NAMENDA) 10 MG tablet Take 1 tablet (10 mg total) by mouth 2 (two) times daily.  . metoprolol succinate (TOPROL XL) 25 MG 24 hr tablet Take 1 tablet (25 mg total) by mouth daily.  [name] Kitchen  nitroGLYCERIN (NITROSTAT) 0.4 MG SL tablet Place 1 tablet (0.4 mg total) under the tongue every 5 (five) minutes as needed for chest pain.  . prednisoLONE acetate (PRED FORTE) 1 % ophthalmic suspension Place 1 drop into both eyes in the morning and at bedtime.     Allergies:   Penicillins and Penicillin g benzathine   Social History   Tobacco Use  . Smoking status: Former Smoker    Packs/day: 1.00    Years: 25.00    Pack years: 25.00    Types: Cigarettes    Quit date: 09/16/1998    Years since quitting: 22.3  . Smokeless tobacco: Never Used  Vaping Use  . Vaping Use: Never used  Substance Use Topics  . Alcohol use: Yes    Alcohol/week: 5.0 standard drinks    Types: 2 Glasses of wine, 3 Shots of liquor per week    Comment: drinks a tequila shot daily  . Drug use: No     Family Hx: The patient's family history includes Heart attack in his father; Heart disease in his father.  ROS:   Please  see the history of present illness.     All other systems reviewed and are negative.   Labs/Other Tests and Data Reviewed:    Recent Labs: 10/11/2020: Platelets 231 10/18/2020: Hemoglobin 13.3 12/06/2020: BUN 37; Creatinine, Ser 1.69; Potassium 5.1; Sodium 146   Recent Lipid Panel Lab Results  Component Value Date/Time   CHOL 128 12/16/2016 08:22 AM   TRIG 74 12/16/2016 08:22 AM   HDL 55 12/16/2016 08:22 AM   CHOLHDL 2.3 12/16/2016 08:22 AM   CHOLHDL 2.7 07/08/2016 08:02 AM   LDLCALC 58 12/16/2016 08:22 AM    Wt Readings from Last 3 Encounters:  01/02/21 150 lb 12.8 oz (68.4 kg)  12/06/20 150 lb 9.6 oz (68.3 kg)  11/01/20 151 lb (68.5 kg)     Objective:    Vital Signs:  BP (!) 102/58   Pulse 83   Ht 5\' 9"  (1.753 m)   Wt 150 lb 12.8 oz (68.4 kg)   SpO2 98%   BMI 22.27 kg/m    GEN: Well nourished, well developed in no acute distress HEENT: Normal NECK: No JVD; No carotid bruits LYMPHATICS: No lymphadenopathy CARDIAC:RRR, no murmurs, rubs, gallops RESPIRATORY:  Clear to auscultation without rales, wheezing or rhonchi  ABDOMEN: Soft, non-tender, non-distended MUSCULOSKELETAL:  No edema; No deformity  SKIN: Warm and dry NEUROLOGIC:  Alert and oriented x 3 PSYCHIATRIC:  Normal affect    ASSESSMENT & PLAN:    1.  ASCAD -he recently was hospitalized for an episode of CP with negative enzymes -2D echo with new wall motion abnormality in the mid to apical inferior wall>>EF appears worse than prior scan -nuclear stress test high risk -right and left heart cath 10/18/2020 showing 40% prox to mid RCA, 30% mid LAD and 50% mid LAD with low filling pressures and EF 30% and medical management was recommended.   -he denies any anginal symptoms -no ASA due to DOAC -continue high intensity statin, long acting nitrates and BB  2.  Bilateral carotid artery stenosis -He had dopplers done 11/2018 showing 1-39% bilateral stenosis.  -continue statin -no ASA due to DOAC  3.   Complete HB  -he is s/p PPM followed in device clinic.   -device interrogation in hospital Dec 2021 after syncope in Radiology with no arrhythmia noted  4.  HTN  -BP is adequately controlled on exam today -continue Entresto 24-26mg  BID  and Toprol XL 25mg  daily  5.  PAF  -he remains in NSR on exam with no palpitations -he denies any bleeding problems on Eliquis -HBg was 13.3 on 10/18/2020 -SCr stable 1.630 on 10/18/2020 -continue Eliquis 2.5mg  BID (dosed for age>80 and SCR on average > 1.5)  6.  Hyperlipidemia   his LDL goal is < 70. -LDL 65 in Sept 2021  -continue on atorvastatin 80mg  daily.   7.  CKD stage 3a -his creatinine is stable at 1.4-1.69  8.  Mixed ischemic non-ischemic DCM/chronic systolic CHF -EF had been 45-50% on echo in 2014 and recently read as 30-35% -cath with mild to moderate nonobstructive CAD 10/2020 and DCM out of proportion to degree of CAD -he appears euvolemic on exam today -he continue Entresto 24-26mg  BID, Farxiga 10mg  daily, Imdur 15mg  daily and Toprol XL 25mg  daily -soft BP prohibits addition of spiro or uptitration of Entresto or BB -check BMET today since he has now been on Entresto for 1 week -not a candidate for AICD given advanced age  Followup with me in 3 months   Medication Adjustments/Labs and Tests Ordered: Current medicines are reviewed at length with the patient today.  Concerns regarding medicines are outlined above.  Tests Ordered: No orders of the defined types were placed in this encounter.  Medication Changes: No orders of the defined types were placed in this encounter.   Disposition:  Follow up in 6 month(s)  Signed, Armanda Magicraci Ceriah Kohler, MD  01/02/2021 11:19 AM    [facility]

## 2021-01-02 NOTE — Telephone Encounter (Signed)
Called and spoke with pt's wife, confirmed that they did receive Farxiga in the mail and pt has started taking 1 tablet daily. Also confirmed that he has stopped his glipizide and pioglitazone (contraindicated in CHF). He has been getting Entresto samples from his PCP office most recently. Discussed donut hole pricing of his Eliquis and Entresto. If cost does become prohibitive, could change pt back to losartan if needed. If he is able to stay on 1 branded med, would prefer the Eliquis as pt has Alzheimer's disease and pt's wife states that bringing him out for visits (which would be required for warfarin monitoring) is challenging. She was appreciative for the call and will let us know if Sherryll Burger becomes too expensive.

## 2021-01-02 NOTE — Patient Instructions (Signed)
Medication Instructions:  Your physician recommends that you continue on your current medications as directed. Please refer to the Current Medication list given to you today.  *If you need a refill on your cardiac medications before your next appointment, please call your pharmacy*  Follow-Up: At [facility], you and your health needs are our priority.  As part of our continuing mission to provide you with exceptional heart care, we have created designated Provider Care Teams.  These Care Teams include your primary Cardiologist (physician) and Advanced Practice Providers (APPs -  Physician Assistants and Nurse Practitioners) who all work together to provide you with the care you need, when you need it.  Your next appointment:   3 month(s)  The format for your next appointment:   In Person  Provider:   You may see Traci Turner, MD or one of the following Advanced Practice Providers on your designated Care Team:    Dayna Dunn, PA-C  Michele Lenze, PA-C    

## 2021-01-09 DIAGNOSIS — N183 Chronic kidney disease, stage 3 unspecified: Secondary | ICD-10-CM | POA: Diagnosis not present

## 2021-01-09 DIAGNOSIS — E782 Mixed hyperlipidemia: Secondary | ICD-10-CM | POA: Diagnosis not present

## 2021-01-09 DIAGNOSIS — I10 Essential (primary) hypertension: Secondary | ICD-10-CM | POA: Diagnosis not present

## 2021-01-09 DIAGNOSIS — E1122 Type 2 diabetes mellitus with diabetic chronic kidney disease: Secondary | ICD-10-CM | POA: Diagnosis not present

## 2021-01-09 DIAGNOSIS — E039 Hypothyroidism, unspecified: Secondary | ICD-10-CM | POA: Diagnosis not present

## 2021-01-09 DIAGNOSIS — I25810 Atherosclerosis of coronary artery bypass graft(s) without angina pectoris: Secondary | ICD-10-CM | POA: Diagnosis not present

## 2021-01-09 DIAGNOSIS — I480 Paroxysmal atrial fibrillation: Secondary | ICD-10-CM | POA: Diagnosis not present

## 2021-01-09 DIAGNOSIS — I5020 Unspecified systolic (congestive) heart failure: Secondary | ICD-10-CM | POA: Diagnosis not present

## 2021-01-09 DIAGNOSIS — E7800 Pure hypercholesterolemia, unspecified: Secondary | ICD-10-CM | POA: Diagnosis not present

## 2021-01-09 DIAGNOSIS — I255 Ischemic cardiomyopathy: Secondary | ICD-10-CM | POA: Diagnosis not present

## 2021-01-09 DIAGNOSIS — J439 Emphysema, unspecified: Secondary | ICD-10-CM | POA: Diagnosis not present

## 2021-01-09 DIAGNOSIS — F0390 Unspecified dementia without behavioral disturbance: Secondary | ICD-10-CM | POA: Diagnosis not present

## 2021-01-10 ENCOUNTER — Ambulatory Visit: Payer: PPO | Admitting: Adult Health

## 2021-01-10 ENCOUNTER — Other Ambulatory Visit: Payer: Self-pay

## 2021-01-10 VITALS — BP 123/65 | HR 89 | Ht 68.0 in | Wt 149.0 lb

## 2021-01-10 DIAGNOSIS — F0390 Unspecified dementia without behavioral disturbance: Secondary | ICD-10-CM | POA: Diagnosis not present

## 2021-01-10 NOTE — Progress Notes (Signed)
PATIENT: Edward Suarez DOB: Dec 15, 1931  REASON FOR VISIT: follow up HISTORY FROM: patient Primary neurologist: Dr. Vickey Huger  HISTORY OF PRESENT ILLNESS: Today 01/10/21:  Edward Suarez is an 87 year old male with a history of memory disturbance.  He returns today for follow-up.  He remains on Aricept 10 mg at bedtime and Namenda 10 mg twice a day.  His wife does feel that his memory has gotten worse.  He lives at home with his wife.  He is able to complete all ADLs independently but with prompting.  Denies any trouble sleeping.  No change in mood or behavior.  Wife reports that he is sleepy throughout the day.  He does not operate a motor vehicle.  She has noticed a decrease in his appetite.  He returns today for an evaluation.  07/11/20: Edward Suarez is an 87 year old male with a history of memory disturbance. She returns today for follow-up. Currently on Aricept and Namenda. Wife has noticed some progression. She manages his meds/appointments and fiances. He is able to complete all ADLS. Not driving. Sleeps well. Pleasant. No new issues.   HISTORY 01/03/20:  Edward Suarez is an 87 year old male with a history of memory disturbance.  He returns today for follow-up.  His wife feels that his memory has gotten slightly worse particularly his short-term memory.  She reports that he is able to complete all ADLs independently.  He continues to help with chores.  He does not operate a motor vehicle.  His wife manages his medications, appointments and all the finances.  She reports that approximately once a month he will wake up at 3 or 4 AM and turn all the lights on and watch television.  Otherwise no significant changes in his mood or behaviors.  REVIEW OF SYSTEMS: Out of a complete 14 system review of symptoms, the patient complains only of the following symptoms, and all other reviewed systems are negative.  See HPI  ALLERGIES: Allergies  Allergen Reactions  . Penicillins Rash    Reaction:  Childhood  . Penicillin G Benzathine Other (See Comments)    HOME MEDICATIONS: Outpatient Medications Prior to Visit  Medication Sig Dispense Refill  . amoxicillin (AMOXIL) 500 MG tablet Take 1,000 mg by mouth 2 (two) times daily.    [name] Kitchen apixaban (ELIQUIS) 2.5 MG TABS tablet Take 1 tablet (2.5 mg total) by mouth 2 (two) times daily. 180 tablet 1  . atorvastatin (LIPITOR) 80 MG tablet TAKE 1 TABLET (80 MG TOTAL) BY MOUTH EVERY EVENING. 90 tablet 2  . Calcium Carb-Cholecalciferol (CALCIUM 600 + D PO) Take 1 tablet by mouth daily. 10 mcg vit d    . cetirizine (ZYRTEC) 10 MG tablet Take 10 mg by mouth daily as needed for allergies. For allergies. Takes prn    . chlorhexidine (PERIDEX) 0.12 % solution as needed.    . dapagliflozin propanediol (FARXIGA) 10 MG TABS tablet Take 10 mg by mouth daily.    [name] Kitchen donepezil (ARICEPT) 10 MG tablet TAKE ONE TABLET BY MOUTH EVERY EVENING 90 tablet 3  . ENTRESTO 24-26 MG TAKE ONE TABLET BY MOUTH AT BREAKFAST AND AT BEDTIME 180 tablet 3  . fluticasone (FLONASE) 50 MCG/ACT nasal spray Place 2 sprays into both nostrils daily as needed for allergies or rhinitis.    [name] Kitchen isosorbide mononitrate (IMDUR) 30 MG 24 hr tablet Take 0.5 tablets (15 mg total) by mouth daily. 45 tablet 3  . levothyroxine (SYNTHROID, LEVOTHROID) 50 MCG tablet Take 50 mcg by mouth  daily before breakfast.   3  . memantine (NAMENDA) 10 MG tablet Take 1 tablet (10 mg total) by mouth 2 (two) times daily. 180 tablet 3  . metoprolol succinate (TOPROL XL) 25 MG 24 hr tablet Take 1 tablet (25 mg total) by mouth daily. 30 tablet 11  . nitroGLYCERIN (NITROSTAT) 0.4 MG SL tablet Place 1 tablet (0.4 mg total) under the tongue every 5 (five) minutes as needed for chest pain. 14 tablet 0  . prednisoLONE acetate (PRED FORTE) 1 % ophthalmic suspension Place 1 drop into both eyes in the morning and at bedtime.     No facility-administered medications prior to visit.    PAST MEDICAL HISTORY: Past Medical History:   Diagnosis Date  . Allergic rhinitis, cause unspecified   . BPH (benign prostatic hyperplasia)   . Carotid artery stenosis, asymptomatic    1-39% by dopplers 09/2016 followed by Dr. Darrick Penna  . Cataracts, bilateral   . Chronic kidney disease (CKD), stage III (moderate) (HCC)   . Complete heart block (HCC) 01/23/04   s/p Medtronic PPM implanted by Dr Amil Amen  . COPD (chronic obstructive pulmonary disease) (HCC)   . Diabetes mellitus   . Glaucoma   . Hyperlipidemia   . Hypertension   . 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 Kidney stones   . PAF (paroxysmal atrial fibrillation) (HCC) 10/09/2014   Noted on pacer check with 88 mode switches and longest episode >1 hour.  Now on Apixiban for CHADS2VASC score of 5    PAST SURGICAL HISTORY: Past Surgical History:  Procedure Laterality Date  . PACEMAKER INSERTION  2005, 04/16/12   MDT implanted by Dr Amil Amen with generator chnage (MDT Adapta L) by Dr Johney Frame 04/16/12  . PERMANENT PACEMAKER GENERATOR CHANGE N/A 04/16/2012   Procedure: PERMANENT PACEMAKER GENERATOR CHANGE;  Surgeon: Hillis Range, MD;  Location: [facility] CATH LAB;  Service: Cardiovascular;  Laterality: N/A;  . RIGHT/LEFT HEART CATH AND CORONARY ANGIOGRAPHY N/A 10/18/2020   Procedure: RIGHT/LEFT HEART CATH AND CORONARY ANGIOGRAPHY;  Surgeon: Lennette Bihari, MD;  Location: [facility] INVASIVE CV LAB;  Service: Cardiovascular;  Laterality: N/A;    FAMILY HISTORY: Family History  Problem Relation Age of Onset  . Heart disease Father   . Heart attack Father     SOCIAL HISTORY: Social History   Socioeconomic History  . Marital status: Married    Spouse name: [name]  . Number of children: 2  . Years of education: BS  . Highest education level: Not on file  Occupational History  . Occupation: retired  Tobacco Use  . Smoking status: Former Smoker    Packs/day: 1.00    Years: 25.00    Pack years: 25.00    Types: Cigarettes    Quit date: 09/16/1998    Years since quitting: 22.3  .  Smokeless tobacco: Never Used  Vaping Use  . Vaping Use: Never used  Substance and Sexual Activity  . Alcohol use: Yes    Alcohol/week: 5.0 standard drinks    Types: 2 Glasses of wine, 3 Shots of liquor per week    Comment: drinks a tequila shot daily  . Drug use: No  . Sexual activity: Not on file  Other Topics Concern  . Not on file  Social History Narrative   Retired Mudlogger.  Lives in [place].   Patient is married with 2 children.   Patient is right handed.   Patient has BS degree.   Patient drinks 1 cup daily.  Social Determinants of Health   Financial Resource Strain: Not on file  Food Insecurity: Not on file  Transportation Needs: Not on file  Physical Activity: Not on file  Stress: Not on file  Social Connections: Not on file  Intimate Partner Violence: Not on file      PHYSICAL EXAM  Vitals:   01/10/21 1104  BP: 123/65  Pulse: 89  Weight: 149 lb (67.6 kg)  Height: 5\' 8"  (1.727 m)   Body mass index is 22.66 kg/m.   MMSE - Mini Mental State Exam 07/11/2020 07/11/2020 01/03/2020  Not completed: - Unable to complete -  Orientation to time 0 1 2  Orientation to Place 1 1 2   Registration 3 - 3  Attention/ Calculation 0 - 4  Attention/Calculation-comments - - -  Recall 0 - 0  Language- name 2 objects 2 - 2  Language- repeat 0 - 0  Language- follow 3 step command 1 - 2  Language- follow 3 step command-comments - - -  Language- read & follow direction 1 - 1  Write a sentence 0 - 1  Copy design 0 - 0  Copy design-comments - - 3 animals  Total score 8 - 17     Generalized: Well developed, in no acute distress   Neurological examination  Mentation: Alert oriented to person.   Follows all commands speech and language fluent Cranial nerve II-XII: Pupils were equal round reactive to light. Extraocular movements were full, visual field were full on confrontational test.  Head turning and shoulder shrug  were normal and  symmetric. Motor: The motor testing reveals 5 over 5 strength of all 4 extremities. Good symmetric motor tone is noted throughout.  Sensory: Sensory testing is intact to soft touch on all 4 extremities. No evidence of extinction is noted.  Coordination: Cerebellar testing reveals good finger-nose-finger and heel-to-shin bilaterally.  Gait and station: Gait is normal. Reflexes: Deep tendon reflexes are symmetric and normal bilaterally.   DIAGNOSTIC DATA (LABS, IMAGING, TESTING) - I reviewed patient records, labs, notes, testing and imaging myself where available.  Lab Results  Component Value Date   WBC 6.1 10/11/2020   HGB 13.3 10/18/2020   HCT 39.0 10/18/2020   MCV 83 10/11/2020   PLT 231 10/11/2020      Component Value Date/Time   NA 146 (H) 12/06/2020 1610   K 5.1 12/06/2020 1610   CL 109 (H) 12/06/2020 1610   CO2 21 12/06/2020 1610   GLUCOSE 72 12/06/2020 1610   GLUCOSE 149 (H) 09/04/2020 0746   BUN 37 (H) 12/06/2020 1610   CREATININE 1.69 (H) 12/06/2020 1610   CALCIUM 9.1 12/06/2020 1610   PROT 5.9 (L) 12/16/2016 0822   ALBUMIN 4.3 12/16/2016 0822   AST 21 12/16/2016 0822   ALT 16 12/16/2016 0822   ALKPHOS 61 12/16/2016 0822   BILITOT 0.3 12/16/2016 0822   GFRNONAA 38 (L) 10/11/2020 1435   GFRNONAA 49 (L) 09/04/2020 0746   GFRAA 43 (L) 10/11/2020 1435   Lab Results  Component Value Date   CHOL 128 12/16/2016   HDL 55 12/16/2016   LDLCALC 58 12/16/2016   TRIG 74 12/16/2016   CHOLHDL 2.3 12/16/2016   Lab Results  Component Value Date   HGBA1C 6.7 (H) 11/16/2020   No results found for: VITAMINB12 No results found for: TSH    ASSESSMENT AND PLAN 87 y.o. year old male  has a past medical history of Allergic rhinitis, cause unspecified, BPH (benign prostatic hyperplasia), Carotid artery  stenosis, asymptomatic, Cataracts, bilateral, Chronic kidney disease (CKD), stage III (moderate) (HCC), Complete heart block (HCC) (01/23/04), COPD (chronic obstructive pulmonary  disease) (HCC), Diabetes mellitus, Glaucoma, Hyperlipidemia, Hypertension, Insomnia due to medical condition (08/10/2014), Kidney stones, and PAF (paroxysmal atrial fibrillation) (HCC) (10/09/2014). here with:  1. Dementia  - Continue Aricept 10 mg at bedtime - Continue Namenda 10 mg BID - Advised if symptoms worsen or develop new symptoms then let us kow -FU in 6 months  I spent 32 minutes of face-to-face and non-face-to-face time with patient.  This included previsit chart review, discussion of disease progression and staging of Alzheimer's.  Butch Penny, MSN, NP-C 01/10/2021, 11:11 AM [facility] [address] [place], [place] [address] [phone]

## 2021-01-10 NOTE — Patient Instructions (Signed)
Your Plan:  Continue aricept and Namenda If your symptoms worsen or you develop new symptoms please let us know.   Thank you for coming to see us at [facility]. I hope we have been able to provide you high quality care today.  You may receive a patient satisfaction survey over the next few weeks. We would appreciate your feedback and comments so that we may continue to improve ourselves and the health of our patients.  

## 2021-01-11 ENCOUNTER — Ambulatory Visit: Payer: PPO | Admitting: Cardiology

## 2021-01-11 DIAGNOSIS — I6521 Occlusion and stenosis of right carotid artery: Secondary | ICD-10-CM | POA: Diagnosis not present

## 2021-01-11 DIAGNOSIS — I10 Essential (primary) hypertension: Secondary | ICD-10-CM | POA: Diagnosis not present

## 2021-01-11 DIAGNOSIS — N1832 Chronic kidney disease, stage 3b: Secondary | ICD-10-CM | POA: Diagnosis not present

## 2021-01-11 DIAGNOSIS — E7800 Pure hypercholesterolemia, unspecified: Secondary | ICD-10-CM | POA: Diagnosis not present

## 2021-01-11 DIAGNOSIS — E1122 Type 2 diabetes mellitus with diabetic chronic kidney disease: Secondary | ICD-10-CM | POA: Diagnosis not present

## 2021-01-11 DIAGNOSIS — Z1389 Encounter for screening for other disorder: Secondary | ICD-10-CM | POA: Diagnosis not present

## 2021-01-11 DIAGNOSIS — I255 Ischemic cardiomyopathy: Secondary | ICD-10-CM | POA: Diagnosis not present

## 2021-01-11 DIAGNOSIS — I25810 Atherosclerosis of coronary artery bypass graft(s) without angina pectoris: Secondary | ICD-10-CM | POA: Diagnosis not present

## 2021-01-11 DIAGNOSIS — I442 Atrioventricular block, complete: Secondary | ICD-10-CM | POA: Diagnosis not present

## 2021-01-11 DIAGNOSIS — Z0000 Encounter for general adult medical examination without abnormal findings: Secondary | ICD-10-CM | POA: Diagnosis not present

## 2021-01-11 DIAGNOSIS — I5020 Unspecified systolic (congestive) heart failure: Secondary | ICD-10-CM | POA: Diagnosis not present

## 2021-01-11 DIAGNOSIS — Z7984 Long term (current) use of oral hypoglycemic drugs: Secondary | ICD-10-CM | POA: Diagnosis not present

## 2021-01-11 DIAGNOSIS — E039 Hypothyroidism, unspecified: Secondary | ICD-10-CM | POA: Diagnosis not present

## 2021-01-11 DIAGNOSIS — F0390 Unspecified dementia without behavioral disturbance: Secondary | ICD-10-CM | POA: Diagnosis not present

## 2021-01-15 ENCOUNTER — Telehealth: Payer: Self-pay | Admitting: Adult Health

## 2021-01-15 NOTE — Telephone Encounter (Signed)
Called and LMVM for wife.  Last seen 01-10-21 witn MM/NP.  Continue nemanda and aricept.

## 2021-01-15 NOTE — Telephone Encounter (Signed)
Pt's wife, Josaiah Muhammed called, would like to discuss next plan about his dementia. Would like a call from the nurse.

## 2021-01-16 NOTE — Telephone Encounter (Signed)
Dr Vickey Huger would prefer the apt to discuss.

## 2021-01-16 NOTE — Telephone Encounter (Signed)
I called wife, and she is wanting an appointment (consult with MD) to talk about next plan for her husband (ALZ Dementia) who is in [facility] independent living right now.  I relayed that MM/NP who just saw her recently would be happy to speak with her re: this.  Wife insistent wanting to see the Doctor.  Tentative appt held for 03-22-2021 at 0830 with CD/MD).  Or if she wants to call her that would open up appt,

## 2021-01-16 NOTE — Telephone Encounter (Signed)
Called and LMVM for wife. Made appt with MD for 03-22-2021 At 0830.  She is to call back if questions.

## 2021-01-16 NOTE — Telephone Encounter (Signed)
Pt's wife, Jakai Risse (on [place]) called, would like to discuss future and treatment and placement. Would like a call from the nurse. Call my cellphone: [phone]

## 2021-02-06 ENCOUNTER — Other Ambulatory Visit: Payer: Self-pay | Admitting: Adult Health

## 2021-02-13 DIAGNOSIS — J439 Emphysema, unspecified: Secondary | ICD-10-CM | POA: Diagnosis not present

## 2021-02-13 DIAGNOSIS — E1122 Type 2 diabetes mellitus with diabetic chronic kidney disease: Secondary | ICD-10-CM | POA: Diagnosis not present

## 2021-02-13 DIAGNOSIS — I25810 Atherosclerosis of coronary artery bypass graft(s) without angina pectoris: Secondary | ICD-10-CM | POA: Diagnosis not present

## 2021-02-13 DIAGNOSIS — I480 Paroxysmal atrial fibrillation: Secondary | ICD-10-CM | POA: Diagnosis not present

## 2021-02-13 DIAGNOSIS — E039 Hypothyroidism, unspecified: Secondary | ICD-10-CM | POA: Diagnosis not present

## 2021-02-13 DIAGNOSIS — I5020 Unspecified systolic (congestive) heart failure: Secondary | ICD-10-CM | POA: Diagnosis not present

## 2021-02-13 DIAGNOSIS — I10 Essential (primary) hypertension: Secondary | ICD-10-CM | POA: Diagnosis not present

## 2021-02-13 DIAGNOSIS — E782 Mixed hyperlipidemia: Secondary | ICD-10-CM | POA: Diagnosis not present

## 2021-02-13 DIAGNOSIS — F0390 Unspecified dementia without behavioral disturbance: Secondary | ICD-10-CM | POA: Diagnosis not present

## 2021-02-13 DIAGNOSIS — N183 Chronic kidney disease, stage 3 unspecified: Secondary | ICD-10-CM | POA: Diagnosis not present

## 2021-02-13 DIAGNOSIS — I255 Ischemic cardiomyopathy: Secondary | ICD-10-CM | POA: Diagnosis not present

## 2021-02-26 DIAGNOSIS — E039 Hypothyroidism, unspecified: Secondary | ICD-10-CM | POA: Diagnosis not present

## 2021-03-13 ENCOUNTER — Ambulatory Visit (INDEPENDENT_AMBULATORY_CARE_PROVIDER_SITE_OTHER): Payer: PPO

## 2021-03-13 DIAGNOSIS — I255 Ischemic cardiomyopathy: Secondary | ICD-10-CM | POA: Diagnosis not present

## 2021-03-13 DIAGNOSIS — I480 Paroxysmal atrial fibrillation: Secondary | ICD-10-CM | POA: Diagnosis not present

## 2021-03-13 DIAGNOSIS — J439 Emphysema, unspecified: Secondary | ICD-10-CM | POA: Diagnosis not present

## 2021-03-13 DIAGNOSIS — E782 Mixed hyperlipidemia: Secondary | ICD-10-CM | POA: Diagnosis not present

## 2021-03-13 DIAGNOSIS — E1122 Type 2 diabetes mellitus with diabetic chronic kidney disease: Secondary | ICD-10-CM | POA: Diagnosis not present

## 2021-03-13 DIAGNOSIS — I420 Dilated cardiomyopathy: Secondary | ICD-10-CM | POA: Diagnosis not present

## 2021-03-13 DIAGNOSIS — N1832 Chronic kidney disease, stage 3b: Secondary | ICD-10-CM | POA: Diagnosis not present

## 2021-03-13 DIAGNOSIS — E039 Hypothyroidism, unspecified: Secondary | ICD-10-CM | POA: Diagnosis not present

## 2021-03-13 DIAGNOSIS — F0390 Unspecified dementia without behavioral disturbance: Secondary | ICD-10-CM | POA: Diagnosis not present

## 2021-03-13 DIAGNOSIS — I10 Essential (primary) hypertension: Secondary | ICD-10-CM | POA: Diagnosis not present

## 2021-03-13 DIAGNOSIS — E7800 Pure hypercholesterolemia, unspecified: Secondary | ICD-10-CM | POA: Diagnosis not present

## 2021-03-13 DIAGNOSIS — I25810 Atherosclerosis of coronary artery bypass graft(s) without angina pectoris: Secondary | ICD-10-CM | POA: Diagnosis not present

## 2021-03-15 LAB — CUP PACEART REMOTE DEVICE CHECK
Battery Impedance: 2449 Ohm
Battery Remaining Longevity: 19 mo
Battery Voltage: 2.72 V
Brady Statistic AP VP Percent: 95 %
Brady Statistic AP VS Percent: 0 %
Brady Statistic AS VP Percent: 4 %
Brady Statistic AS VS Percent: 0 %
Date Time Interrogation Session: 20220630083257
Implantable Lead Implant Date: 20050509
Implantable Lead Implant Date: 20050509
Implantable Lead Location: 753859
Implantable Lead Location: 753860
Implantable Lead Model: 5076
Implantable Lead Model: 5092
Implantable Pulse Generator Implant Date: 20130801
Lead Channel Impedance Value: 418 Ohm
Lead Channel Impedance Value: 617 Ohm
Lead Channel Pacing Threshold Amplitude: 0.625 V
Lead Channel Pacing Threshold Amplitude: 1.375 V
Lead Channel Pacing Threshold Pulse Width: 0.4 ms
Lead Channel Pacing Threshold Pulse Width: 0.4 ms
Lead Channel Setting Pacing Amplitude: 2 V
Lead Channel Setting Pacing Amplitude: 3.5 V
Lead Channel Setting Pacing Pulse Width: 0.4 ms
Lead Channel Setting Sensing Sensitivity: 4 mV

## 2021-03-22 ENCOUNTER — Ambulatory Visit: Payer: PPO | Admitting: Neurology

## 2021-03-22 DIAGNOSIS — F0281 Dementia in other diseases classified elsewhere with behavioral disturbance: Secondary | ICD-10-CM

## 2021-03-22 DIAGNOSIS — F02818 Dementia in other diseases classified elsewhere, unspecified severity, with other behavioral disturbance: Secondary | ICD-10-CM | POA: Insufficient documentation

## 2021-03-22 DIAGNOSIS — F03918 Unspecified dementia, unspecified severity, with other behavioral disturbance: Secondary | ICD-10-CM | POA: Insufficient documentation

## 2021-03-22 DIAGNOSIS — G301 Alzheimer's disease with late onset: Secondary | ICD-10-CM | POA: Insufficient documentation

## 2021-03-22 NOTE — Patient Instructions (Signed)

## 2021-03-22 NOTE — Progress Notes (Signed)
   Discussion with Mrs. Si, who would like some clarification about her husband's medical future in the near term.  Edward Suarez has been a patient of this office for about 6 years now and initially presented with a mild cognitive impairment which quickly became an late in life onset dementia there has been no stepwise decline his decline has been continuous and progressive and he now has had episodes of nocturnal confusion sometimes even delusions.  The couple lives at friend's homes and Chanz continues to love social outings conversations do not meetings he does not need help with feeding bathing or clothing.  His sentences however now have no longer and objects and he creates a kind of word salad or small talk without content.   At night he has been more active and he has even tried or went to a neighbor's apartment believing that there was a dinner party he was invited to.  He misunderstood or mixed up but he had a dinner appointment the next evening.  The couple brought him back and was very gracious about it but he has definitely developed sundowning - the couple walks daily to expose him to natural daylight which helps with circadian rhythm control it is also important that he has physical activity.   During the  hospitalization recently an echocardiogram again showed an ejection fraction of only 30% and mild to moderate coronary artery disease.   He became confused, it took several nurses to calm him down and he became actually more increasingly agitated.   Finally mixture of melatonin and Xanax has helped to make the night smoother during hospitalization and I would very much be in favor of continuing this at home.  Renae Gloss continues to love ice cream and also he has a healthy and moderate diet I think that life quality for him now comes first and also for his wife.  So in light of his other medical illnesses and comorbidities I do not think that he will die of dementia but will diagnosed with  dementia.  I expect him to have from his brain status probably more than 2 years to go before he would need full assistance from a neurologic status.    His medical illnesses however are more likely to limit his life expectancy than the dementia.  Melvyn Novas, MD

## 2021-03-23 ENCOUNTER — Telehealth: Payer: Self-pay | Admitting: Pharmacist

## 2021-03-23 DIAGNOSIS — I5020 Unspecified systolic (congestive) heart failure: Secondary | ICD-10-CM

## 2021-03-23 MED ORDER — ENTRESTO 24-26 MG PO TABS: 1.0000 | ORAL_TABLET | Freq: Two times a day (BID) | ORAL | 0 refills | Status: DC

## 2021-03-23 NOTE — Telephone Encounter (Signed)
Spoke with patient's wife.  Contacted [employer] patient assistance but they did not have record of her application on file.  However [employer] did.  Will print out another application for patient to sign and leave Entresto sample up front.    Note: wife had only been giving him Entresto once daily.  Did not realize was BID medication.

## 2021-03-26 NOTE — Telephone Encounter (Signed)
Patient assistance application faxed to [employer]

## 2021-04-03 NOTE — Progress Notes (Signed)
Remote pacemaker transmission.   

## 2021-04-05 ENCOUNTER — Ambulatory Visit: Payer: PPO | Admitting: Cardiology

## 2021-04-05 ENCOUNTER — Other Ambulatory Visit: Payer: Self-pay

## 2021-04-05 ENCOUNTER — Telehealth: Payer: Self-pay | Admitting: Pharmacist

## 2021-04-05 ENCOUNTER — Encounter: Payer: Self-pay | Admitting: Cardiology

## 2021-04-05 VITALS — BP 90/70 | HR 76 | Ht 68.0 in | Wt 145.4 lb

## 2021-04-05 DIAGNOSIS — I5020 Unspecified systolic (congestive) heart failure: Secondary | ICD-10-CM

## 2021-04-05 DIAGNOSIS — I2510 Atherosclerotic heart disease of native coronary artery without angina pectoris: Secondary | ICD-10-CM | POA: Diagnosis not present

## 2021-04-05 DIAGNOSIS — I6523 Occlusion and stenosis of bilateral carotid arteries: Secondary | ICD-10-CM | POA: Diagnosis not present

## 2021-04-05 DIAGNOSIS — I480 Paroxysmal atrial fibrillation: Secondary | ICD-10-CM | POA: Diagnosis not present

## 2021-04-05 DIAGNOSIS — I2583 Coronary atherosclerosis due to lipid rich plaque: Secondary | ICD-10-CM

## 2021-04-05 DIAGNOSIS — I442 Atrioventricular block, complete: Secondary | ICD-10-CM

## 2021-04-05 DIAGNOSIS — E7800 Pure hypercholesterolemia, unspecified: Secondary | ICD-10-CM

## 2021-04-05 DIAGNOSIS — I10 Essential (primary) hypertension: Secondary | ICD-10-CM | POA: Diagnosis not present

## 2021-04-05 MED ORDER — APIXABAN 2.5 MG PO TABS: 2.5000 mg | ORAL_TABLET | Freq: Two times a day (BID) | ORAL | 1 refills | Status: DC

## 2021-04-05 MED ORDER — METOPROLOL SUCCINATE ER 25 MG PO TB24: 12.5000 mg | ORAL_TABLET | Freq: Every day | ORAL | 3 refills | Status: DC

## 2021-04-05 MED ORDER — DAPAGLIFLOZIN PROPANEDIOL 10 MG PO TABS: 10.0000 mg | ORAL_TABLET | Freq: Every day | ORAL | 3 refills | Status: DC

## 2021-04-05 MED ORDER — ISOSORBIDE MONONITRATE ER 30 MG PO TB24: 15.0000 mg | ORAL_TABLET | Freq: Every day | ORAL | 3 refills | Status: DC

## 2021-04-05 MED ORDER — ATORVASTATIN CALCIUM 80 MG PO TABS: 80.0000 mg | ORAL_TABLET | Freq: Every evening | ORAL | 2 refills | Status: DC

## 2021-04-05 NOTE — Patient Instructions (Signed)
Medication Instructions:  Your physician has recommended you make the following change in your medication: 1) DECREASE Toprol XL (metoprolol succinate) to 12.5 mg (1/2 tablet) daily  *If you need a refill on your cardiac medications before your next appointment, please call your pharmacy*  Follow-Up: At [facility], you and your health needs are our priority.  As part of our continuing mission to provide you with exceptional heart care, we have created designated Provider Care Teams.  These Care Teams include your primary Cardiologist (physician) and Advanced Practice Providers (APPs -  Physician Assistants and Nurse Practitioners) who all work together to provide you with the care you need, when you need it.  Your next appointment:   6 month(s)  The format for your next appointment:   In Person  Provider:   You may see Armanda Magic, MD or one of the following Advanced Practice Providers on your designated Care Team:   Ronie Spies, PA-C Jacolyn Reedy, PA-C

## 2021-04-05 NOTE — Telephone Encounter (Signed)
Received fax from [employer] that pt was approved for [facility] pt assistance for the remainder of the year. Called pt and spoke with his wife who was appreciative for the call.

## 2021-04-05 NOTE — Progress Notes (Signed)
Date:  04/05/2021   ID:  Edward Suarez, DOB 12/14/1931, MRN [id number]  Patient Location:  Home  Provider location:   [place]  PCP:  Renford Dills, MD  Cardiologist:  Armanda Magic, MD  Electrophysiologist:  None   Chief Complaint:  HTN, PAF, Hyperlipidemia and carotid stenosis  History of Present Illness:    Edward Suarez is a 87 y.o. male with a hx of  HTN, DM, dyslipidemia, complete HB s/p PPM and PVC's, mild (1-39% bilateral) carotid artery stenosis and is followed by Dr. Darrick Penna. He also has  PAF noted on pacer check in 2016 and due to [facility] score of 5, was started on Eliquis.   He was recently hospitalized in Dec 2021 with chest pain.  EKG showed paced rhythm and while in radiology had a near syncopal episode with no arrhythmia on pacer interrogation.  Cardiology was consulted and 2D echo showed moderate LV dysfunction with EF 30% with AK of all mid to apical segments and LV apex.  After discussion with patient he did not want any further inpt evaluation and was discharged home with plan to followup in office to discuss ischemic workup given normal hsTrop during hospital stay.  It was felt that RV pacing with abnormal septal motion may be exaggerating the degree of LV dysfunction.  Due to his advanced age, confusion and restlessness in hospital, he was felt not to be an ideal candidate for cath.    He underwent Lexiscan myoview 09/2020 showing EF 27% with large inferior infarct with minimal peri-infarct ischemia felt to be a high risk scan.  He underwent right and left heart cath 10/18/2020 showing 40% prox to mid RCA, 30% mid LAD and 50% mid LAD with low filling pressures and EF 30% and medical management was recommended.  His losartan was changed to [place] added.  He is here today for followup and is doing well.  He denies any chest pain or pressure, SOB, DOE, PND, orthopnea, LE edema, dizziness, palpitations or syncope. He is compliant with his meds and is  tolerating meds with no SE.    Prior CV studies:   The following studies were reviewed today:  Cardiac Cath 10/18/2020 Conclusion    Prox RCA to Mid RCA lesion is 40% stenosed. Mid LAD-1 lesion is 30% stenosed. Mid LAD-2 lesion is 50% stenosed.   Low normal right heart pressures with mean PA pressure 12 mmHg.   Mild to moderate CAD with mild diffuse 30% and focal 50% mid LAD stenoses; normal left circumflex, and 40% smooth mid RCA stenosis.   Echo documentation of a dilated cardiomyopathy with EF estimate at 30% with wall motion abnormalities as noted apically, inferiorly, and mid septally.   RECOMMENDATION: Gentle hydration post-cath particularly with the patient's stage IIIb CKD.  GDMT for reduced LV function mild to moderate CAD.  Resume Eliquis tomorrow.  Aggressive lipid-lowering therapy with target LDL less than 70.  2D echo 08/2020 IMPRESSIONS    1. Left ventricular ejection fraction, by estimation, is 30%. The left  ventricle has moderate to severely decreased function. There is akinesis  of all mid-to-apical septal segments, mid-to-apical inferior segments, and  the LV apex. The mid-to-apical  inferolateral segments are severely hypokinetic. The rest of the LV  segments are moderately hypokinetic. The left ventricular internal cavity  size was mildly dilated. There is mild concentric left ventricular  hypertrophy. Left ventricular diastolic  parameters are consistent with Grade I diastolic dysfunction (impaired  relaxation).  2. Right ventricular systolic function is normal. The right ventricular  size is normal.   3. Left atrial size was moderately dilated.   4. The mitral valve is abnormal. Mild mitral valve regurgitation.   5. The aortic valve is tricuspid. There is mild calcification of the  aortic valve. There is mild thickening of the aortic valve. Aortic valve  regurgitation is trivial.   6. The inferior vena cava is normal in size with greater than 50%   respiratory variability, suggesting right atrial pressure of 3 mmHg.   Comparison(s): Compared to prior echo report in 2014, the LVEF is now  moderately-to-severely reduced with regional wall motion abnormalities as  detailed above.   Past Medical History:  Diagnosis Date   Allergic rhinitis, cause unspecified    BPH (benign prostatic hyperplasia)    Carotid artery stenosis, asymptomatic    1-39% by dopplers 09/2016 followed by Dr. Darrick PennaFields   Cataracts, bilateral    Chronic kidney disease (CKD), stage III (moderate) (HCC)    Complete heart block (HCC) 01/23/04   s/p Medtronic PPM implanted by Dr Amil AmenEdmunds   COPD (chronic obstructive pulmonary disease) (HCC)    Diabetes mellitus    Glaucoma    Hyperlipidemia    Hypertension    Insomnia due to medical condition 08/10/2014   Kidney stones    PAF (paroxysmal atrial fibrillation) (HCC) 10/09/2014   Noted on pacer check with 88 mode switches and longest episode >1 hour.  Now on Apixiban for CHADS2VASC score of 5   Past Surgical History:  Procedure Laterality Date   PACEMAKER INSERTION  2005, 04/16/12   MDT implanted by Dr Amil AmenEdmunds with generator chnage (MDT Adapta L) by Dr Johney FrameAllred 04/16/12   PERMANENT PACEMAKER GENERATOR CHANGE N/A 04/16/2012   Procedure: PERMANENT PACEMAKER GENERATOR CHANGE;  Surgeon: Hillis RangeJames Allred, MD;  [facility];  Service: Cardiovascular;  Laterality: N/A;   RIGHT/LEFT HEART CATH AND CORONARY ANGIOGRAPHY N/A 10/18/2020   Procedure: RIGHT/LEFT HEART CATH AND CORONARY ANGIOGRAPHY;  Surgeon: Lennette BihariKelly, Thomas A, MD;  Location: [facility] INVASIVE CV LAB;  Service: Cardiovascular;  Laterality: N/A;     Current Meds  Medication Sig   amoxicillin (AMOXIL) 500 MG tablet Take 1,000 mg by mouth 2 (two) times daily.   apixaban (ELIQUIS) 2.5 MG TABS tablet Take 1 tablet (2.5 mg total) by mouth 2 (two) times daily.   atorvastatin (LIPITOR) 80 MG tablet TAKE 1 TABLET (80 MG TOTAL) BY MOUTH EVERY EVENING.   Calcium Carb-Cholecalciferol  (CALCIUM 600 + D PO) Take 1 tablet by mouth daily. 10 mcg vit d   cetirizine (ZYRTEC) 10 MG tablet Take 10 mg by mouth daily as needed for allergies. For allergies. Takes prn   chlorhexidine (PERIDEX) 0.12 % solution as needed.   dapagliflozin propanediol (FARXIGA) 10 MG TABS tablet Take 10 mg by mouth daily.   donepezil (ARICEPT) 10 MG tablet TAKE ONE TABLET BY MOUTH EVERY EVENING   ENTRESTO 24-26 MG TAKE ONE TABLET BY MOUTH AT BREAKFAST AND AT BEDTIME   fluticasone (FLONASE) 50 MCG/ACT nasal spray Place 2 sprays into both nostrils daily as needed for allergies or rhinitis.   isosorbide mononitrate (IMDUR) 30 MG 24 hr tablet Take 0.5 tablets (15 mg total) by mouth daily.   memantine (NAMENDA) 10 MG tablet Take 1 tablet (10 mg total) by mouth 2 (two) times daily.   metoprolol succinate (TOPROL XL) 25 MG 24 hr tablet Take 1 tablet (25 mg total) by mouth daily.   nitroGLYCERIN (NITROSTAT)  0.4 MG SL tablet Place 1 tablet (0.4 mg total) under the tongue every 5 (five) minutes as needed for chest pain.   prednisoLONE acetate (PRED FORTE) 1 % ophthalmic suspension Place 1 drop into both eyes in the morning and at bedtime.   sacubitril-valsartan (ENTRESTO) 24-26 MG Take 1 tablet by mouth 2 (two) times daily.   SYNTHROID 75 MCG tablet Take 75 mcg by mouth daily.     Allergies:   Penicillins and Penicillin g benzathine   Social History   Tobacco Use   Smoking status: Former    Packs/day: 1.00    Years: 25.00    Pack years: 25.00    Types: Cigarettes    Quit date: 09/16/1998    Years since quitting: 22.5   Smokeless tobacco: Never  Vaping Use   Vaping Use: Never used  Substance Use Topics   Alcohol use: Yes    Alcohol/week: 5.0 standard drinks    Types: 2 Glasses of wine, 3 Shots of liquor per week    Comment: drinks a tequila shot daily   Drug use: No     Family Hx: The patient's family history includes Heart attack in his father; Heart disease in his father.  ROS:   Please see the  history of present illness.     All other systems reviewed and are negative.   Labs/Other Tests and Data Reviewed:    Recent Labs: 10/11/2020: Platelets 231 10/18/2020: Hemoglobin 13.3 12/06/2020: BUN 37; Creatinine, Ser 1.69; Potassium 5.1; Sodium 146   Recent Lipid Panel Lab Results  Component Value Date/Time   CHOL 128 12/16/2016 08:22 AM   TRIG 74 12/16/2016 08:22 AM   HDL 55 12/16/2016 08:22 AM   CHOLHDL 2.3 12/16/2016 08:22 AM   CHOLHDL 2.7 07/08/2016 08:02 AM   LDLCALC 58 12/16/2016 08:22 AM    Wt Readings from Last 3 Encounters:  04/05/21 145 lb 6.4 oz (66 kg)  01/10/21 149 lb (67.6 kg)  01/02/21 150 lb 12.8 oz (68.4 kg)     Objective:    Vital Signs:  BP 90/70   Pulse 76   Ht 5\' 8"  (1.727 m)   Wt 145 lb 6.4 oz (66 kg)   SpO2 93%   BMI 22.11 kg/m    GEN: Well nourished, well developed in no acute distress HEENT: Normal NECK: No JVD; No carotid bruits LYMPHATICS: No lymphadenopathy CARDIAC:RRR, no murmurs, rubs, gallops RESPIRATORY:  Clear to auscultation without rales, wheezing or rhonchi  ABDOMEN: Soft, non-tender, non-distended MUSCULOSKELETAL:  No edema; No deformity  SKIN: Warm and dry NEUROLOGIC:  Alert and oriented x 3 PSYCHIATRIC:  Normal affect   ASSESSMENT & PLAN:    1.  ASCAD -he recently was hospitalized for an episode of CP with negative enzymes -2D echo with new wall motion abnormality in the mid to apical inferior wall>>EF appeared worse than prior scan -nuclear stress test was high risk -right and left heart cath 10/18/2020 showing 40% prox to mid RCA, 30% mid LAD and 50% mid LAD with low filling pressures and EF 30% and medical management was recommended.   -he denies any anginal symptoms since I saw him last -no ASA due to DOAC -Continue prescription drug management with Imdur 15mg  daily, high dose statin and decrease Toprol XL to 12.5mg  daily due to soft BP>.refilled for 6 months   2.  Bilateral carotid artery stenosis -He had dopplers  done 11/2018 showing 1-39% bilateral stenosis.  -continue statin -no ASA due to DOAC  3.  Complete HB  -he is s/p PPM followed in device clinic.   -device interrogation in hospital Dec 2021 after syncope in Radiology with no arrhythmia noted  4.  HTN  -BP is adequately controlled on exam today -continue Entresto 24-26mg  BID and decrease Toprol XL to 12.5mg  daily due to soft BP  5.  PAF  -he continues to remain in NSR and denies any palpitations -he denies any bleeding problems on Eliquis -I have personally reviewed and interpreted outside labs performed by patient's PCP which showed SCr 1.69 in march 2022 and Hbg 14.2 in April 2022 -Continue prescription drug management with Eliquis 2.5mg  BID>>refilled for 6 months  (dosed for age>80 and SCR on average > 1.5)  6.  Hyperlipidemia  -his LDL goal is < 70. -I have personally reviewed and interpreted outside PCP performed by patient's PCP which showed LDL 67, HDL 53 in April 2022 and ALT 13 in Sept 2021  -Continue prescription drug management with Atorvastatin 80mg  daily>refilled for 6 months  7.  Mixed ischemic non-ischemic DCM/chronic systolic CHF -EF had been 45-50% on echo in 2014 and recently read as 30-35% -cath with mild to moderate nonobstructive CAD 10/2020 and DCM out of proportion to degree of CAD -he does not appear volume overloaded on exam today and weight is down and his legs have no edema -Continue prescription drug management with Entresto 24-26mg  BID, Farxiga 10mg  daily, Imdur 15mg  daily>refilled for 6 months -decrease Toprol XL to 12.5mg  daily due to soft BP -soft BP prohibits addition of spiro or uptitration of Entresto or BB -not a candidate for AICD given advanced age  Followup with me in 3 months   Medication Adjustments/Labs and Tests Ordered: Current medicines are reviewed at length with the patient today.  Concerns regarding medicines are outlined above.  Tests Ordered: No orders of the defined types were  placed in this encounter.  Medication Changes: No orders of the defined types were placed in this encounter.   Disposition:  Follow up in 6 month(s)  Signed, 11/2020, MD  04/05/2021 11:16 AM     [facility]

## 2021-04-05 NOTE — Addendum Note (Signed)
Addended by: Theresia Majors on: 04/05/2021 11:24 AM   Modules accepted: Orders

## 2021-04-09 DIAGNOSIS — I255 Ischemic cardiomyopathy: Secondary | ICD-10-CM | POA: Diagnosis not present

## 2021-04-09 DIAGNOSIS — J439 Emphysema, unspecified: Secondary | ICD-10-CM | POA: Diagnosis not present

## 2021-04-09 DIAGNOSIS — I480 Paroxysmal atrial fibrillation: Secondary | ICD-10-CM | POA: Diagnosis not present

## 2021-04-09 DIAGNOSIS — N183 Chronic kidney disease, stage 3 unspecified: Secondary | ICD-10-CM | POA: Diagnosis not present

## 2021-04-09 DIAGNOSIS — I25810 Atherosclerosis of coronary artery bypass graft(s) without angina pectoris: Secondary | ICD-10-CM | POA: Diagnosis not present

## 2021-04-09 DIAGNOSIS — E1122 Type 2 diabetes mellitus with diabetic chronic kidney disease: Secondary | ICD-10-CM | POA: Diagnosis not present

## 2021-04-09 DIAGNOSIS — E7800 Pure hypercholesterolemia, unspecified: Secondary | ICD-10-CM | POA: Diagnosis not present

## 2021-04-09 DIAGNOSIS — I5020 Unspecified systolic (congestive) heart failure: Secondary | ICD-10-CM | POA: Diagnosis not present

## 2021-04-09 DIAGNOSIS — I10 Essential (primary) hypertension: Secondary | ICD-10-CM | POA: Diagnosis not present

## 2021-04-09 DIAGNOSIS — E782 Mixed hyperlipidemia: Secondary | ICD-10-CM | POA: Diagnosis not present

## 2021-04-09 DIAGNOSIS — E039 Hypothyroidism, unspecified: Secondary | ICD-10-CM | POA: Diagnosis not present

## 2021-04-09 DIAGNOSIS — F0390 Unspecified dementia without behavioral disturbance: Secondary | ICD-10-CM | POA: Diagnosis not present

## 2021-05-16 ENCOUNTER — Ambulatory Visit: Payer: PPO | Admitting: Internal Medicine

## 2021-05-16 ENCOUNTER — Other Ambulatory Visit: Payer: Self-pay

## 2021-05-16 VITALS — BP 104/60 | HR 84 | Ht 68.0 in | Wt 146.0 lb

## 2021-05-16 DIAGNOSIS — I442 Atrioventricular block, complete: Secondary | ICD-10-CM

## 2021-05-16 DIAGNOSIS — I480 Paroxysmal atrial fibrillation: Secondary | ICD-10-CM | POA: Diagnosis not present

## 2021-05-16 DIAGNOSIS — I10 Essential (primary) hypertension: Secondary | ICD-10-CM

## 2021-05-16 NOTE — Progress Notes (Signed)
PCP: Renford Dills, MD Primary Cardiologist: Dr Mayford Knife Primary EP:  Dr Purvis Sheffield Edward Suarez is a 87 y.o. male who presents today to re-establish in the electrophysiology clinic.  Edward have not seen him since 2018.   He has dementia.  He is accompanied by his wife (POA) today.    Today, he denies symptoms of palpitations, chest pain, shortness of breath,  lower extremity edema, dizziness, presyncope, or syncope.  The patient is otherwise without complaint today.   Past Medical History:  Diagnosis Date   Allergic rhinitis, cause unspecified    BPH (benign prostatic hyperplasia)    Carotid artery stenosis, asymptomatic    1-39% by dopplers 09/2016 followed by Dr. Darrick Penna   Cataracts, bilateral    Chronic kidney disease (CKD), stage III (moderate) (HCC)    Complete heart block (HCC) 01/23/04   s/p Medtronic PPM implanted by Dr Amil Amen   COPD (chronic obstructive pulmonary disease) (HCC)    Diabetes mellitus    Glaucoma    Hyperlipidemia    Hypertension    Insomnia due to medical condition 08/10/2014   Kidney stones    PAF (paroxysmal atrial fibrillation) (HCC) 10/09/2014   Noted on pacer check with 88 mode switches and longest episode >1 hour.  Now on Apixiban for CHADS2VASC score of 5   Past Surgical History:  Procedure Laterality Date   PACEMAKER INSERTION  2005, 04/16/12   MDT implanted by Dr Amil Amen with generator chnage (MDT Adapta L) by Dr Johney Frame 04/16/12   PERMANENT PACEMAKER GENERATOR CHANGE N/A 04/16/2012   Procedure: PERMANENT PACEMAKER GENERATOR CHANGE;  Surgeon: Hillis Range, MD;  Location: [facility] CATH LAB;  Service: Cardiovascular;  Laterality: N/A;   RIGHT/LEFT HEART CATH AND CORONARY ANGIOGRAPHY N/A 10/18/2020   Procedure: RIGHT/LEFT HEART CATH AND CORONARY ANGIOGRAPHY;  Surgeon: Lennette Bihari, MD;  Location: [facility] INVASIVE CV LAB;  Service: Cardiovascular;  Laterality: N/A;    ROS- all systems are reviewed and negative except as per HPI above  Current Outpatient Medications   Medication Sig Dispense Refill   apixaban (ELIQUIS) 2.5 MG TABS tablet Take 1 tablet (2.5 mg total) by mouth 2 (two) times daily. 180 tablet 1   atorvastatin (LIPITOR) 80 MG tablet Take 1 tablet (80 mg total) by mouth every evening. 90 tablet 2   Calcium Carb-Cholecalciferol (CALCIUM 600 + D PO) Take 1 tablet by mouth daily. 10 mcg vit d     cetirizine (ZYRTEC) 10 MG tablet Take 10 mg by mouth daily as needed for allergies. For allergies. Takes prn     chlorhexidine (PERIDEX) 0.12 % solution as needed.     dapagliflozin propanediol (FARXIGA) 10 MG TABS tablet Take 1 tablet (10 mg total) by mouth daily. 90 tablet 3   donepezil (ARICEPT) 10 MG tablet TAKE ONE TABLET BY MOUTH EVERY EVENING 90 tablet 3   ENTRESTO 24-26 MG TAKE ONE TABLET BY MOUTH AT BREAKFAST AND AT BEDTIME 180 tablet 3   fluticasone (FLONASE) 50 MCG/ACT nasal spray Place 2 sprays into both nostrils daily as needed for allergies or rhinitis.     isosorbide mononitrate (IMDUR) 30 MG 24 hr tablet Take 0.5 tablets (15 mg total) by mouth daily. 45 tablet 3   memantine (NAMENDA) 10 MG tablet Take 1 tablet (10 mg total) by mouth 2 (two) times daily. 180 tablet 3   metoprolol succinate (TOPROL XL) 25 MG 24 hr tablet Take 0.5 tablets (12.5 mg total) by mouth daily. 45 tablet 3   nitroGLYCERIN (  NITROSTAT) 0.4 MG SL tablet Place 1 tablet (0.4 mg total) under the tongue every 5 (five) minutes as needed for chest pain. 14 tablet 0   prednisoLONE acetate (PRED FORTE) 1 % ophthalmic suspension Place 1 drop into both eyes in the morning and at bedtime.     SYNTHROID 75 MCG tablet Take 75 mcg by mouth daily.     No current facility-administered medications for this visit.    Physical Exam: Vitals:   05/16/21 1143  BP: 104/60  Pulse: 84  SpO2: 94%  Weight: 146 lb (66.2 kg)  Height: 5\' 8"  (1.727 m)    GEN- The patient is well appearing, alert and oriented x 3 today.   Head- normocephalic, atraumatic Eyes-  Sclera clear, conjunctiva  pink Ears- hearing intact Oropharynx- clear Lungs- Clear to ausculation bilaterally, normal work of breathing Chest- pacemaker pocket is well healed Heart- Regular rate and rhythm, no murmurs, rubs or gallops, PMI not laterally displaced GI- soft, NT, ND, + BS Extremities- no clubbing, cyanosis, or edema  Pacemaker interrogation- reviewed in detail today,  See PACEART report  ekg tracing ordered today is personally reviewed and shows sinus with V pacing  Assessment and Plan:  1. Symptomatic complete heart block Normal pacemaker function See Pace Art report No changes today he is device dependant today We discussed importance of remote monitoring today  2. Paroxysmal atrial fibrillation Chads2vasc score is at least 5.  He is on eliquis AF burden is <0.1%  3. HTN Stable No change required today  4. Chronic systolic dysfunction EF 30% Edward agree with Dr that he is not a candidate for ICD.  Given advanced dementia, his wife is not interested in upgrade to CRT either.  5. Code status Edward have discussed at length with his wife Mayford [facility]) who is very clear that his wishes are to be DNI/DNR.   Risks, benefits and potential toxicities for medications prescribed and/or refilled reviewed with patient today.   Return in a year  [facility] MD, [facility] 05/16/2021 11:48 AM

## 2021-05-16 NOTE — Patient Instructions (Signed)
Medication Instructions:  Your physician recommends that you continue on your current medications as directed. Please refer to the Current Medication list given to you today.  Labwork: None ordered.  Testing/Procedures: None ordered.  Follow-Up: Your physician wants you to follow-up in: 12 months with  Hillis Range, MD    You will receive a reminder letter in the mail two months in advance. If you don't receive a letter, please call our office to schedule the follow-up appointment.  Remote monitoring is used to monitor your Pacemaker from home. This monitoring reduces the number of office visits required to check your device to one time per year. It allows [place] to keep an eye on the functioning of your device to ensure it is working properly. You are scheduled for a device check from home on 06/12/21. You may send your transmission at any time that day. If you have a wireless device, the transmission will be sent automatically. After your physician reviews your transmission, you will receive a postcard with your next transmission date.  Any Other Special Instructions Will Be Listed Below (If Applicable).  If you need a refill on your cardiac medications before your next appointment, please call your pharmacy.

## 2021-05-17 ENCOUNTER — Emergency Department (HOSPITAL_COMMUNITY): Payer: PPO

## 2021-05-17 ENCOUNTER — Emergency Department (HOSPITAL_COMMUNITY)
Admission: EM | Admit: 2021-05-17 | Discharge: 2021-05-17 | Disposition: A | Discharge status: Home / Self Care | Payer: PPO | Attending: Emergency Medicine | Admitting: Emergency Medicine

## 2021-05-17 ENCOUNTER — Other Ambulatory Visit: Payer: Self-pay

## 2021-05-17 DIAGNOSIS — S065X9A Traumatic subdural hemorrhage with loss of consciousness of unspecified duration, initial encounter: Secondary | ICD-10-CM

## 2021-05-17 DIAGNOSIS — T1490XA Injury, unspecified, initial encounter: Secondary | ICD-10-CM

## 2021-05-17 DIAGNOSIS — Z20822 Contact with and (suspected) exposure to covid-19: Secondary | ICD-10-CM | POA: Insufficient documentation

## 2021-05-17 DIAGNOSIS — R4182 Altered mental status, unspecified: Secondary | ICD-10-CM | POA: Diagnosis not present

## 2021-05-17 DIAGNOSIS — S51812A Laceration without foreign body of left forearm, initial encounter: Secondary | ICD-10-CM | POA: Insufficient documentation

## 2021-05-17 DIAGNOSIS — S0003XA Contusion of scalp, initial encounter: Secondary | ICD-10-CM | POA: Diagnosis not present

## 2021-05-17 DIAGNOSIS — S51012A Laceration without foreign body of left elbow, initial encounter: Secondary | ICD-10-CM | POA: Insufficient documentation

## 2021-05-17 DIAGNOSIS — S065XAA Traumatic subdural hemorrhage with loss of consciousness status unknown, initial encounter: Secondary | ICD-10-CM

## 2021-05-17 DIAGNOSIS — S299XXA Unspecified injury of thorax, initial encounter: Secondary | ICD-10-CM | POA: Diagnosis not present

## 2021-05-17 DIAGNOSIS — Z7901 Long term (current) use of anticoagulants: Secondary | ICD-10-CM | POA: Diagnosis not present

## 2021-05-17 DIAGNOSIS — R739 Hyperglycemia, unspecified: Secondary | ICD-10-CM | POA: Diagnosis not present

## 2021-05-17 DIAGNOSIS — I959 Hypotension, unspecified: Secondary | ICD-10-CM | POA: Diagnosis not present

## 2021-05-17 DIAGNOSIS — S0990XA Unspecified injury of head, initial encounter: Secondary | ICD-10-CM | POA: Diagnosis not present

## 2021-05-17 DIAGNOSIS — I700 Atherosclerosis of aorta: Secondary | ICD-10-CM | POA: Diagnosis not present

## 2021-05-17 DIAGNOSIS — R404 Transient alteration of awareness: Secondary | ICD-10-CM | POA: Diagnosis not present

## 2021-05-17 DIAGNOSIS — S59902A Unspecified injury of left elbow, initial encounter: Secondary | ICD-10-CM | POA: Diagnosis not present

## 2021-05-17 DIAGNOSIS — S065X0A Traumatic subdural hemorrhage without loss of consciousness, initial encounter: Secondary | ICD-10-CM | POA: Diagnosis not present

## 2021-05-17 DIAGNOSIS — S3993XA Unspecified injury of pelvis, initial encounter: Secondary | ICD-10-CM | POA: Diagnosis not present

## 2021-05-17 DIAGNOSIS — J984 Other disorders of lung: Secondary | ICD-10-CM | POA: Diagnosis not present

## 2021-05-17 DIAGNOSIS — W109XXA Fall (on) (from) unspecified stairs and steps, initial encounter: Secondary | ICD-10-CM | POA: Diagnosis not present

## 2021-05-17 DIAGNOSIS — R61 Generalized hyperhidrosis: Secondary | ICD-10-CM | POA: Diagnosis not present

## 2021-05-17 DIAGNOSIS — I10 Essential (primary) hypertension: Secondary | ICD-10-CM | POA: Diagnosis not present

## 2021-05-17 DIAGNOSIS — S59912A Unspecified injury of left forearm, initial encounter: Secondary | ICD-10-CM | POA: Diagnosis not present

## 2021-05-17 DIAGNOSIS — S199XXA Unspecified injury of neck, initial encounter: Secondary | ICD-10-CM | POA: Diagnosis not present

## 2021-05-17 LAB — COMPREHENSIVE METABOLIC PANEL
ALT: 15 U/L (ref 0–44)
AST: 14 U/L — ABNORMAL LOW (ref 15–41)
Albumin: 3.6 g/dL (ref 3.5–5.0)
Alkaline Phosphatase: 69 U/L (ref 38–126)
Anion gap: 8 (ref 5–15)
BUN: 30 mg/dL — ABNORMAL HIGH (ref 8–23)
CO2: 22 mmol/L (ref 22–32)
Calcium: 9 mg/dL (ref 8.9–10.3)
Chloride: 107 mmol/L (ref 98–111)
Creatinine, Ser: 1.74 mg/dL — ABNORMAL HIGH (ref 0.61–1.24)
GFR, Estimated: 37 mL/min — ABNORMAL LOW (ref >60)
Glucose, Bld: 292 mg/dL — ABNORMAL HIGH (ref 70–99)
Potassium: 4.3 mmol/L (ref 3.5–5.1)
Sodium: 137 mmol/L (ref 135–145)
Total Bilirubin: 0.5 mg/dL (ref 0.3–1.2)
Total Protein: 6 g/dL — ABNORMAL LOW (ref 6.5–8.1)

## 2021-05-17 LAB — CBC
HCT: 46.5 % (ref 39.0–52.0)
Hemoglobin: 14.2 g/dL (ref 13.0–17.0)
MCH: 26.5 pg (ref 26.0–34.0)
MCHC: 30.5 g/dL (ref 30.0–36.0)
MCV: 86.9 fL (ref 80.0–100.0)
Platelets: 261 10*3/uL (ref 150–400)
RBC: 5.35 MIL/uL (ref 4.22–5.81)
RDW: 17.2 % — ABNORMAL HIGH (ref 11.5–15.5)
WBC: 7.2 10*3/uL (ref 4.0–10.5)
nRBC: 0 % (ref 0.0–0.2)

## 2021-05-17 LAB — I-STAT CHEM 8, ED
BUN: 31 mg/dL — ABNORMAL HIGH (ref 8–23)
Calcium, Ion: 1.21 mmol/L (ref 1.15–1.40)
Chloride: 108 mmol/L (ref 98–111)
Creatinine, Ser: 1.7 mg/dL — ABNORMAL HIGH (ref 0.61–1.24)
Glucose, Bld: 273 mg/dL — ABNORMAL HIGH (ref 70–99)
HCT: 46 % (ref 39.0–52.0)
Hemoglobin: 15.6 g/dL (ref 13.0–17.0)
Potassium: 4.2 mmol/L (ref 3.5–5.1)
Sodium: 142 mmol/L (ref 135–145)
TCO2: 23 mmol/L (ref 22–32)

## 2021-05-17 LAB — SAMPLE TO BLOOD BANK

## 2021-05-17 LAB — RESP PANEL BY RT-PCR (FLU A&B, COVID) ARPGX2
Influenza A by PCR: NEGATIVE
Influenza B by PCR: NEGATIVE
SARS Coronavirus 2 by RT PCR: NEGATIVE

## 2021-05-17 LAB — PROTIME-INR
INR: 1.2 (ref 0.8–1.2)
Prothrombin Time: 14.8 seconds (ref 11.4–15.2)

## 2021-05-17 MED ORDER — ACETAMINOPHEN 325 MG PO TABS
650.0000 mg | ORAL_TABLET | Freq: Once | ORAL | Status: AC
  Administered 2021-05-17: 650 mg via ORAL
  Filled 2021-05-17: qty 2

## 2021-05-17 NOTE — ED Provider Notes (Signed)
[facility] EMERGENCY DEPARTMENT Provider Note   CSN: [id number] Arrival date & time: 05/17/21  2200     History No chief complaint on file.   Edward Suarez is a 87 y.o. male.  Pt presents to the ED today as a level 2 trauma.  Pt fell going down steps.  He lost his footing.  He is on Eliquis.  Pt did sustain a few skin tears to his left forearm and elbow.  No other obvious injuries.      No past medical history on file.  There are no problems to display for this patient.  See merged chart    No family history on file.     Home Medications Prior to Admission medications   Not on File    Allergies    Patient has no known allergies.  Review of Systems   Review of Systems  Skin:  Positive for wound.  All other systems reviewed and are negative.  Physical Exam Updated Vital Signs BP 132/61   Pulse 70   Temp (!) 97.3 F (36.3 C)   Resp 14   Ht 5\' 10"  (1.778 m)   Wt 72.6 kg   SpO2 96%   BMI 22.96 kg/m   Physical Exam Vitals and nursing note reviewed.  Constitutional:      Appearance: Normal appearance.  HENT:     Head: Normocephalic and atraumatic.     Right Ear: External ear normal.     Left Ear: External ear normal.     Nose: Nose normal.     Mouth/Throat:     Mouth: Mucous membranes are moist.     Pharynx: Oropharynx is clear.  Eyes:     Conjunctiva/sclera: Conjunctivae normal.     Pupils: Pupils are equal, round, and reactive to light.  Neck:     Comments: In c-collar Cardiovascular:     Rate and Rhythm: Normal rate and regular rhythm.     Pulses: Normal pulses.     Heart sounds: Normal heart sounds.  Pulmonary:     Effort: Pulmonary effort is normal.     Breath sounds: Normal breath sounds.  Abdominal:     General: Abdomen is flat. Bowel sounds are normal.     Palpations: Abdomen is soft.  Musculoskeletal:        General: Normal range of motion.  Skin:    General: Skin is warm.     Capillary Refill: Capillary  refill takes less than 2 seconds.     Comments: Skin tears to left forearm/elbow  Neurological:     Mental Status: He is alert. Mental status is at baseline.  Psychiatric:        Mood and Affect: Mood normal.        Behavior: Behavior normal.        Thought Content: Thought content normal.        Judgment: Judgment normal.    ED Results / Procedures / Treatments   Labs (all labs ordered are listed, but only abnormal results are displayed) Labs Reviewed  I-STAT CHEM 8, ED - Abnormal; Notable for the following components:      Result Value   BUN 31 (*)    Creatinine, Ser 1.70 (*)    Glucose, Bld 273 (*)    All other components within normal limits  RESP PANEL BY RT-PCR (FLU A&B, COVID) ARPGX2  COMPREHENSIVE METABOLIC PANEL  CBC  ETHANOL  URINALYSIS, ROUTINE W REFLEX MICROSCOPIC  LACTIC ACID, PLASMA  PROTIME-INR  SAMPLE TO BLOOD BANK    EKG EKG Interpretation  Date/Time:  Thursday May 17 2021 22:17:15 EDT Ventricular Rate:  70 PR Interval:  139 QRS Duration: 225 QT Interval:  445 QTC Calculation: 481 R Axis:   253 Text Interpretation: Atrial-ventricular dual-paced rhythm No further analysis attempted due to paced rhythm Baseline wander in lead(s) V5 Confirmed by Jacalyn Lefevre [phone]) on 05/17/2021 10:44:45 PM  Radiology DG Elbow Complete Left  Result Date: 05/17/2021 CLINICAL DATA:  Two trauma EXAM: LEFT ELBOW - COMPLETE 3+ VIEW COMPARISON:  X-ray left forearm 05/17/2021 10:16 p.m. FINDINGS: There is no evidence of fracture, dislocation, or joint effusion. There is no evidence of arthropathy or other focal bone abnormality. Soft tissues are unremarkable. IMPRESSION: Negative. Electronically Signed   By: Tish Frederickson M.D.   On: 05/17/2021 22:37   DG Forearm Left  Result Date: 05/17/2021 CLINICAL DATA:  Fall, level 2 trauma EXAM: LEFT FOREARM - 2 VIEW COMPARISON:  None. FINDINGS: No fracture or dislocation is seen. The joint spaces are preserved. Visualized soft  tissues are within normal limits. IMPRESSION: Negative. Electronically Signed   By: Charline Bills M.D.   On: 05/17/2021 22:31   CT HEAD WO CONTRAST ( )  Result Date: 05/17/2021 CLINICAL DATA:  Head trauma EXAM: CT HEAD WITHOUT CONTRAST CT CERVICAL SPINE WITHOUT CONTRAST TECHNIQUE: Multidetector CT imaging of the head and cervical spine was performed following the standard protocol without intravenous contrast. Multiplanar CT image reconstructions of the cervical spine were also generated. COMPARISON:  None. FINDINGS: CT HEAD FINDINGS Brain: No evidence of large-territorial acute infarction. No parenchymal hemorrhage. No mass lesion. There is a 1 cm right calvarial convexity hematoma formation with density slightly hypodense to the gray matter. No mass effect or midline shift. No hydrocephalus. Basilar cisterns are patent. Vascular: No hyperdense vessel. Skull: No acute fracture or focal lesion. Sinuses/Orbits: Paranasal sinuses and mastoid air cells are clear. The orbits are unremarkable. Other: None. CT CERVICAL SPINE FINDINGS Alignment: Normal. Skull base and vertebrae: Multilevel severe degenerative changes of the spine. No acute fracture. No aggressive appearing focal osseous lesion or focal pathologic process. Soft tissues and spinal canal: No prevertebral fluid or swelling. No visible canal hematoma. Upper chest: Biapical pleural/pulmonary scarring. Other: Partially visualized cardiac leads on the right. IMPRESSION: 1. Likely subacute 1 cm right subdural hematoma with no associated shift. 2. No acute displaced fracture or traumatic listhesis of the cervical spine. These results were called by telephone at the time of interpretation on 05/17/2021 at 10:47 pm to provider Wells Gerdeman , who verbally acknowledged these results. Electronically Signed   By: Tish Frederickson M.D.   On: 05/17/2021 22:51   CT Cervical Spine Wo Contrast  Result Date: 05/17/2021 CLINICAL DATA:  Head trauma EXAM: CT HEAD  WITHOUT CONTRAST CT CERVICAL SPINE WITHOUT CONTRAST TECHNIQUE: Multidetector CT imaging of the head and cervical spine was performed following the standard protocol without intravenous contrast. Multiplanar CT image reconstructions of the cervical spine were also generated. COMPARISON:  None. FINDINGS: CT HEAD FINDINGS Brain: No evidence of large-territorial acute infarction. No parenchymal hemorrhage. No mass lesion. There is a 1 cm right calvarial convexity hematoma formation with density slightly hypodense to the gray matter. No mass effect or midline shift. No hydrocephalus. Basilar cisterns are patent. Vascular: No hyperdense vessel. Skull: No acute fracture or focal lesion. Sinuses/Orbits: Paranasal sinuses and mastoid air cells are clear. The orbits are unremarkable. Other: None. CT CERVICAL SPINE FINDINGS Alignment: Normal. Skull base and  vertebrae: Multilevel severe degenerative changes of the spine. No acute fracture. No aggressive appearing focal osseous lesion or focal pathologic process. Soft tissues and spinal canal: No prevertebral fluid or swelling. No visible canal hematoma. Upper chest: Biapical pleural/pulmonary scarring. Other: Partially visualized cardiac leads on the right. IMPRESSION: 1. Likely subacute 1 cm right subdural hematoma with no associated shift. 2. No acute displaced fracture or traumatic listhesis of the cervical spine. These results were called by telephone at the time of interpretation on 05/17/2021 at 10:47 pm to provider Getsemani Lindon , who verbally acknowledged these results. Electronically Signed   By: Tish Frederickson M.D.   On: 05/17/2021 22:51   DG Pelvis Portable  Result Date: 05/17/2021 CLINICAL DATA:  Trauma EXAM: PORTABLE PELVIS 1-2 VIEWS COMPARISON:  None. FINDINGS: Pubic symphysis and rami are intact. The SI joints are non widened. Both femoral heads project in joint. There are mild degenerative changes of the bilateral hips. IMPRESSION: No acute osseous  abnormality Electronically Signed   By: Jasmine Pang M.D.   On: 05/17/2021 22:31   DG Chest Port 1 View  Result Date: 05/17/2021 CLINICAL DATA:  Level 2 trauma. EXAM: PORTABLE CHEST 1 VIEW COMPARISON:  Chest radiograph dated 09/02/2020. FINDINGS: No focal consolidation pleural effusion, pneumothorax. The cardiac silhouette is within limits. Atherosclerotic calcification of the aorta. Right pectoral pacemaker device. No acute osseous pathology. IMPRESSION: No active disease. Electronically Signed   By: Elgie Collard M.D.   On: 05/17/2021 22:31    Procedures Procedures   Medications Ordered in ED Medications - No data to display  ED Course  I have reviewed the triage vital signs and the nursing notes.  Pertinent labs & imaging results that were available during my care of the patient were reviewed by me and considered in my medical decision making (see chart for details).    MDM Rules/Calculators/A&P                           Pt does have a small subacute SDH.  His wife does not recall any recent falls.  Pt is at his normal MS.  CT reviewed with Dr. Conchita Paris who recommends holding Eliquis and having pt f/u in the office for a repeat CT scan in a few weeks.   Pt is able to ambulate without any problems.  Pt is stable for d/c.  Return if worse.   CRITICAL CARE Performed by: Jacalyn Lefevre   Total critical care time: 30 minutes  Critical care time was exclusive of separately billable procedures and treating other patients.  Critical care was necessary to treat or prevent imminent or life-threatening deterioration.  Critical care was time spent personally by me on the following activities: development of treatment plan with patient and/or surrogate as well as nursing, discussions with consultants, evaluation of patient's response to treatment, examination of patient, obtaining history from patient or surrogate, ordering and performing treatments and interventions, ordering and  review of laboratory studies, ordering and review of radiographic studies, pulse oximetry and re-evaluation of patient's condition.  Final Clinical Impression(s) / ED Diagnoses Final diagnoses:  Trauma  SDH (subdural hematoma) (HCC)    Rx / DC Orders ED Discharge Orders     None        Jacalyn Lefevre, MD 05/17/21 [phone]

## 2021-05-17 NOTE — Discharge Instructions (Addendum)
Hold Eliquis until you see Dr. Conchita Paris (neurosurgeon).

## 2021-05-17 NOTE — ED Notes (Signed)
Dressing applied to skin tears on left arm.

## 2021-05-17 NOTE — ED Notes (Signed)
Trauma Response Nurse Note-  Reason for Call / Reason for Trauma activation:   -Level 2 fall on blood thinner  Initial Focused Assessment (If applicable, or please see trauma documentation):  - Pt came in alert. C-collar in place. No external hemorrhaging noted. Pt noted to be incontinent of urine. EDP notified.   Interventions:  -Blood work, x-rays and ct scans obtained.  Plan of Care as of this note:  -Waiting on imaging results  Event Summary:   -Pt came in as a level 2 trauma, fall on thinners. Pt taken to CT and is back in room at this time. Pts wife is at bedside. Pt is alert, but has history of dementia. Pt on cardiac, bp and pulse ox monitor.

## 2021-05-17 NOTE — ED Triage Notes (Signed)
Brought in by [facility] from home - witnessed fall on thinners - pt was walking down steps , lost footing and fell 3-4 steps. Hx of dementia. C/o left shoulder and left arm pain with skin tears. C collar in place.

## 2021-05-17 NOTE — ED Notes (Signed)
Back to room at this time.

## 2021-05-17 NOTE — ED Notes (Signed)
Patient transported to CT with [facility].

## 2021-05-17 NOTE — Progress Notes (Signed)
Orthopedic Tech Progress Note Patient Details:  Edward Suarez [birth year] [id number] Level 2 trauma Patient ID: [name], male   DOB: [birth year], 87 y.o.   MRN: [id number]  Michelle Piper 05/17/2021, 10:05 PM

## 2021-05-17 NOTE — Progress Notes (Signed)
   05/17/21 2155  Clinical Encounter Type  Visited With Patient not available  Visit Type Trauma  Referral From Nurse  Consult/Referral To Chaplain   Chaplain received level 2 fall on thinners. The patient is being attended to by the medical team. Lunette Stands is available if needed.This note was prepared by Deneen Harts, M.Div..  For questions please contact by phone [phone].

## 2021-05-18 LAB — ETHANOL: Alcohol, Ethyl (B): <10 mg/dL (ref <10)

## 2021-05-18 LAB — LACTIC ACID, PLASMA: Lactic Acid, Venous: 2.4 mmol/L (ref 0.5–1.9)

## 2021-06-06 DIAGNOSIS — E039 Hypothyroidism, unspecified: Secondary | ICD-10-CM | POA: Diagnosis not present

## 2021-06-06 DIAGNOSIS — E1122 Type 2 diabetes mellitus with diabetic chronic kidney disease: Secondary | ICD-10-CM | POA: Diagnosis not present

## 2021-06-06 DIAGNOSIS — I10 Essential (primary) hypertension: Secondary | ICD-10-CM | POA: Diagnosis not present

## 2021-06-06 DIAGNOSIS — I255 Ischemic cardiomyopathy: Secondary | ICD-10-CM | POA: Diagnosis not present

## 2021-06-06 DIAGNOSIS — I5020 Unspecified systolic (congestive) heart failure: Secondary | ICD-10-CM | POA: Diagnosis not present

## 2021-06-06 DIAGNOSIS — F0390 Unspecified dementia without behavioral disturbance: Secondary | ICD-10-CM | POA: Diagnosis not present

## 2021-06-06 DIAGNOSIS — I25810 Atherosclerosis of coronary artery bypass graft(s) without angina pectoris: Secondary | ICD-10-CM | POA: Diagnosis not present

## 2021-06-06 DIAGNOSIS — E782 Mixed hyperlipidemia: Secondary | ICD-10-CM | POA: Diagnosis not present

## 2021-06-06 DIAGNOSIS — N1832 Chronic kidney disease, stage 3b: Secondary | ICD-10-CM | POA: Diagnosis not present

## 2021-06-06 DIAGNOSIS — J439 Emphysema, unspecified: Secondary | ICD-10-CM | POA: Diagnosis not present

## 2021-06-06 DIAGNOSIS — I480 Paroxysmal atrial fibrillation: Secondary | ICD-10-CM | POA: Diagnosis not present

## 2021-06-06 DIAGNOSIS — E7800 Pure hypercholesterolemia, unspecified: Secondary | ICD-10-CM | POA: Diagnosis not present

## 2021-06-11 DIAGNOSIS — H52203 Unspecified astigmatism, bilateral: Secondary | ICD-10-CM | POA: Diagnosis not present

## 2021-06-11 DIAGNOSIS — E119 Type 2 diabetes mellitus without complications: Secondary | ICD-10-CM | POA: Diagnosis not present

## 2021-06-12 ENCOUNTER — Ambulatory Visit (INDEPENDENT_AMBULATORY_CARE_PROVIDER_SITE_OTHER): Payer: PPO

## 2021-06-12 DIAGNOSIS — I442 Atrioventricular block, complete: Secondary | ICD-10-CM | POA: Diagnosis not present

## 2021-06-13 LAB — CUP PACEART REMOTE DEVICE CHECK
Battery Impedance: 2409 Ohm
Battery Remaining Longevity: 22 mo
Battery Voltage: 2.72 V
Brady Statistic AP VP Percent: 97 %
Brady Statistic AP VS Percent: 0 %
Brady Statistic AS VP Percent: 3 %
Brady Statistic AS VS Percent: 0 %
Date Time Interrogation Session: 20220928113417
Implantable Lead Implant Date: 20050509
Implantable Lead Implant Date: 20050509
Implantable Lead Location: 753859
Implantable Lead Location: 753860
Implantable Lead Model: 5076
Implantable Lead Model: 5092
Implantable Pulse Generator Implant Date: 20130801
Lead Channel Impedance Value: 422 Ohm
Lead Channel Impedance Value: 616 Ohm
Lead Channel Pacing Threshold Amplitude: 0.625 V
Lead Channel Pacing Threshold Amplitude: 1.125 V
Lead Channel Pacing Threshold Pulse Width: 0.4 ms
Lead Channel Pacing Threshold Pulse Width: 0.4 ms
Lead Channel Setting Pacing Amplitude: 2 V
Lead Channel Setting Pacing Amplitude: 2.75 V
Lead Channel Setting Pacing Pulse Width: 0.4 ms
Lead Channel Setting Sensing Sensitivity: 4 mV

## 2021-06-20 NOTE — Progress Notes (Signed)
Remote pacemaker transmission.   

## 2021-07-03 DIAGNOSIS — H6123 Impacted cerumen, bilateral: Secondary | ICD-10-CM | POA: Diagnosis not present

## 2021-07-11 DIAGNOSIS — Z66 Do not resuscitate: Secondary | ICD-10-CM | POA: Diagnosis not present

## 2021-07-11 DIAGNOSIS — F0390 Unspecified dementia without behavioral disturbance: Secondary | ICD-10-CM | POA: Diagnosis not present

## 2021-07-11 DIAGNOSIS — Z23 Encounter for immunization: Secondary | ICD-10-CM | POA: Diagnosis not present

## 2021-07-11 DIAGNOSIS — I442 Atrioventricular block, complete: Secondary | ICD-10-CM | POA: Diagnosis not present

## 2021-07-11 DIAGNOSIS — I480 Paroxysmal atrial fibrillation: Secondary | ICD-10-CM | POA: Diagnosis not present

## 2021-07-11 DIAGNOSIS — I5020 Unspecified systolic (congestive) heart failure: Secondary | ICD-10-CM | POA: Diagnosis not present

## 2021-07-11 DIAGNOSIS — E1122 Type 2 diabetes mellitus with diabetic chronic kidney disease: Secondary | ICD-10-CM | POA: Diagnosis not present

## 2021-07-11 DIAGNOSIS — N1832 Chronic kidney disease, stage 3b: Secondary | ICD-10-CM | POA: Diagnosis not present

## 2021-07-11 DIAGNOSIS — I25810 Atherosclerosis of coronary artery bypass graft(s) without angina pectoris: Secondary | ICD-10-CM | POA: Diagnosis not present

## 2021-07-11 DIAGNOSIS — I10 Essential (primary) hypertension: Secondary | ICD-10-CM | POA: Diagnosis not present

## 2021-07-11 DIAGNOSIS — Z7984 Long term (current) use of oral hypoglycemic drugs: Secondary | ICD-10-CM | POA: Diagnosis not present

## 2021-07-12 DIAGNOSIS — E782 Mixed hyperlipidemia: Secondary | ICD-10-CM | POA: Diagnosis not present

## 2021-07-12 DIAGNOSIS — J439 Emphysema, unspecified: Secondary | ICD-10-CM | POA: Diagnosis not present

## 2021-07-12 DIAGNOSIS — I10 Essential (primary) hypertension: Secondary | ICD-10-CM | POA: Diagnosis not present

## 2021-07-12 DIAGNOSIS — I480 Paroxysmal atrial fibrillation: Secondary | ICD-10-CM | POA: Diagnosis not present

## 2021-07-12 DIAGNOSIS — E039 Hypothyroidism, unspecified: Secondary | ICD-10-CM | POA: Diagnosis not present

## 2021-07-12 DIAGNOSIS — E1122 Type 2 diabetes mellitus with diabetic chronic kidney disease: Secondary | ICD-10-CM | POA: Diagnosis not present

## 2021-07-12 DIAGNOSIS — N183 Chronic kidney disease, stage 3 unspecified: Secondary | ICD-10-CM | POA: Diagnosis not present

## 2021-07-12 DIAGNOSIS — F0390 Unspecified dementia without behavioral disturbance: Secondary | ICD-10-CM | POA: Diagnosis not present

## 2021-07-12 DIAGNOSIS — N1832 Chronic kidney disease, stage 3b: Secondary | ICD-10-CM | POA: Diagnosis not present

## 2021-07-12 DIAGNOSIS — E7800 Pure hypercholesterolemia, unspecified: Secondary | ICD-10-CM | POA: Diagnosis not present

## 2021-07-30 DIAGNOSIS — W19XXXA Unspecified fall, initial encounter: Secondary | ICD-10-CM | POA: Diagnosis not present

## 2021-07-30 DIAGNOSIS — S60219A Contusion of unspecified wrist, initial encounter: Secondary | ICD-10-CM | POA: Diagnosis not present

## 2021-08-07 ENCOUNTER — Telehealth: Payer: Self-pay

## 2021-08-07 MED ORDER — VALSARTAN 40 MG PO TABS: 40.0000 mg | ORAL_TABLET | Freq: Every day | ORAL | 3 refills | Status: DC

## 2021-08-07 NOTE — Telephone Encounter (Signed)
Patient's wife is returning call. 

## 2021-08-07 NOTE — Telephone Encounter (Signed)
Can switch from Entresto to valsartan. Patient has history of low BP which is why Entresto could never be titrate further than lower dose.  Therefore recommend switching to valsartan 40mg  once daily.

## 2021-08-07 NOTE — Telephone Encounter (Signed)
Spoke with the patient's wife and have advised on recommendations to switch from Entresto to valsartan 40 mg daily. She verbalized understanding.

## 2021-08-07 NOTE — Telephone Encounter (Signed)
"  We have tried to do the application for continued Entresto assistance.  It turns out that we are slightly above their financial ceiling.  I ask you again for a prescription that my insurance will better cover."  Patient's wife sent above message in regards to patient's Entresto. They have attempted to re-apply for patient assistance for [facility] but have been denied. Wife is asking if there is something else that the patient can take instead.

## 2021-08-07 NOTE — Telephone Encounter (Signed)
Left message for patient to call back  

## 2021-08-08 ENCOUNTER — Ambulatory Visit: Payer: PPO | Admitting: Adult Health

## 2021-08-14 DIAGNOSIS — I480 Paroxysmal atrial fibrillation: Secondary | ICD-10-CM | POA: Diagnosis not present

## 2021-08-14 DIAGNOSIS — J439 Emphysema, unspecified: Secondary | ICD-10-CM | POA: Diagnosis not present

## 2021-08-14 DIAGNOSIS — F0390 Unspecified dementia without behavioral disturbance: Secondary | ICD-10-CM | POA: Diagnosis not present

## 2021-08-14 DIAGNOSIS — E039 Hypothyroidism, unspecified: Secondary | ICD-10-CM | POA: Diagnosis not present

## 2021-08-14 DIAGNOSIS — N183 Chronic kidney disease, stage 3 unspecified: Secondary | ICD-10-CM | POA: Diagnosis not present

## 2021-08-14 DIAGNOSIS — I25810 Atherosclerosis of coronary artery bypass graft(s) without angina pectoris: Secondary | ICD-10-CM | POA: Diagnosis not present

## 2021-08-14 DIAGNOSIS — E1122 Type 2 diabetes mellitus with diabetic chronic kidney disease: Secondary | ICD-10-CM | POA: Diagnosis not present

## 2021-08-14 DIAGNOSIS — I10 Essential (primary) hypertension: Secondary | ICD-10-CM | POA: Diagnosis not present

## 2021-08-14 DIAGNOSIS — I255 Ischemic cardiomyopathy: Secondary | ICD-10-CM | POA: Diagnosis not present

## 2021-08-14 DIAGNOSIS — E7800 Pure hypercholesterolemia, unspecified: Secondary | ICD-10-CM | POA: Diagnosis not present

## 2021-08-14 DIAGNOSIS — E782 Mixed hyperlipidemia: Secondary | ICD-10-CM | POA: Diagnosis not present

## 2021-08-21 ENCOUNTER — Encounter: Payer: Self-pay | Admitting: Adult Health

## 2021-08-21 ENCOUNTER — Ambulatory Visit: Payer: PPO | Admitting: Adult Health

## 2021-08-21 ENCOUNTER — Other Ambulatory Visit: Payer: Self-pay

## 2021-08-21 VITALS — BP 106/61 | HR 76 | Ht 68.0 in | Wt 147.0 lb

## 2021-08-21 DIAGNOSIS — G301 Alzheimer's disease with late onset: Secondary | ICD-10-CM

## 2021-08-21 DIAGNOSIS — F02818 Dementia in other diseases classified elsewhere, unspecified severity, with other behavioral disturbance: Secondary | ICD-10-CM | POA: Diagnosis not present

## 2021-08-21 NOTE — Progress Notes (Signed)
PATIENT: Edward Suarez DOB: Oct 24, 1931  REASON FOR VISIT: follow up HISTORY FROM: patient Primary neurologist: Dr. Vickey Huger  HISTORY OF PRESENT ILLNESS: Today 08/21/21:  Edward Suarez is an 87 year old male with a history of memory disturbance.  He returns today for follow-up.  He is here with his wife.  She provides most of the information.  They continue to live at [facility] home in independent living.  He requires prompting with ADLs.  His wife manages his medications, appointments and finances.  She denies any significant changes in his mood or behavior.  He remains on Aricept and Namenda.  01/10/21: Edward Suarez is an 87 year old male with a history of memory disturbance.  He returns today for follow-up.  He remains on Aricept 10 mg at bedtime and Namenda 10 mg twice a day.  His wife does feel that his memory has gotten worse.  He lives at home with his wife.  He is able to complete all ADLs independently but with prompting.  Denies any trouble sleeping.  No change in mood or behavior.  Wife reports that he is sleepy throughout the day.  He does not operate a motor vehicle.  She has noticed a decrease in his appetite.  He returns today for an evaluation.  07/11/20: Edward Suarez is an 87 year old male with a history of memory disturbance. She returns today for follow-up. Currently on Aricept and Namenda. Wife has noticed some progression. She manages his meds/appointments and fiances. He is able to complete all ADLS. Not driving. Sleeps well. Pleasant. No new issues.   HISTORY 01/03/20:   Edward Suarez is an 87 year old male with a history of memory disturbance.  He returns today for follow-up.  His wife feels that his memory has gotten slightly worse particularly his short-term memory.  She reports that he is able to complete all ADLs independently.  He continues to help with chores.  He does not operate a motor vehicle.  His wife manages his medications, appointments and all the finances.  She reports  that approximately once a month he will wake up at 3 or 4 AM and turn all the lights on and watch television.  Otherwise no significant changes in his mood or behaviors.  REVIEW OF SYSTEMS: Out of a complete 14 system review of symptoms, the patient complains only of the following symptoms, and all other reviewed systems are negative.  See HPI  ALLERGIES: Allergies  Allergen Reactions   Penicillins Rash    Reaction: Childhood   Penicillin G Benzathine Other (See Comments)   Penicillins Rash    HOME MEDICATIONS: Outpatient Medications Prior to Visit  Medication Sig Dispense Refill   apixaban (ELIQUIS) 2.5 MG TABS tablet Take 1 tablet (2.5 mg total) by mouth 2 (two) times daily. 180 tablet 1   atorvastatin (LIPITOR) 80 MG tablet Take 1 tablet (80 mg total) by mouth every evening. 90 tablet 2   atorvastatin (LIPITOR) 80 MG tablet Take 80 mg by mouth daily.     Calcium Carb-Cholecalciferol (CALCIUM 600 + D PO) Take 1 tablet by mouth daily. 10 mcg vit d     cetirizine (ZYRTEC) 10 MG tablet Take 10 mg by mouth daily as needed for allergies. For allergies. Takes prn     chlorhexidine (PERIDEX) 0.12 % solution as needed.     dapagliflozin propanediol (FARXIGA) 10 MG TABS tablet Take 1 tablet (10 mg total) by mouth daily. 90 tablet 3   donepezil (ARICEPT) 10 MG tablet TAKE ONE  TABLET BY MOUTH EVERY EVENING 90 tablet 3   donepezil (ARICEPT) 10 MG tablet Take 10 mg by mouth at bedtime.     ELIQUIS 2.5 MG TABS tablet Take 2.5 mg by mouth 2 (two) times daily.     fluticasone (FLONASE) 50 MCG/ACT nasal spray Place 2 sprays into both nostrils daily as needed for allergies or rhinitis.     glipiZIDE (GLUCOTROL) 5 MG tablet Take 5 mg by mouth daily.     isosorbide mononitrate (IMDUR) 30 MG 24 hr tablet Take 0.5 tablets (15 mg total) by mouth daily. 45 tablet 3   isosorbide mononitrate (IMDUR) 30 MG 24 hr tablet Take 15 mg by mouth daily.     levothyroxine (SYNTHROID) 50 MCG tablet Take 50 mcg by mouth  daily.     loratadine (CLARITIN) 10 MG tablet Take 10 mg by mouth daily as needed for allergies.     memantine (NAMENDA) 10 MG tablet Take 1 tablet (10 mg total) by mouth 2 (two) times daily. 180 tablet 3   memantine (NAMENDA) 10 MG tablet Take 10 mg by mouth daily.     metoprolol succinate (TOPROL XL) 25 MG 24 hr tablet Take 0.5 tablets (12.5 mg total) by mouth daily. 45 tablet 3   metoprolol succinate (TOPROL-XL) 25 MG 24 hr tablet Take 12.5 mg by mouth daily.     nitroGLYCERIN (NITROSTAT) 0.4 MG SL tablet Place 1 tablet (0.4 mg total) under the tongue every 5 (five) minutes as needed for chest pain. 14 tablet 0   prednisoLONE acetate (PRED FORTE) 1 % ophthalmic suspension Place 1 drop into both eyes in the morning and at bedtime.     SYNTHROID 75 MCG tablet Take 75 mcg by mouth daily.     valsartan (DIOVAN) 40 MG tablet Take 1 tablet (40 mg total) by mouth daily. 90 tablet 3   No facility-administered medications prior to visit.    PAST MEDICAL HISTORY: Past Medical History:  Diagnosis Date   Allergic rhinitis, cause unspecified    BPH (benign prostatic hyperplasia)    Carotid artery stenosis, asymptomatic    1-39% by dopplers 09/2016 followed by Dr. Oneida Alar   Cataracts, bilateral    Chronic kidney disease (CKD), stage III (moderate) ([place])    Complete heart block ([place]) 01/23/04   s/p Medtronic PPM implanted by Dr Leonia Reeves   COPD (chronic obstructive pulmonary disease) ([place])    Diabetes mellitus    Glaucoma    Hyperlipidemia    Hypertension    Insomnia due to medical condition 08/10/2014   Kidney stones    PAF (paroxysmal atrial fibrillation) ([place]) 10/09/2014   Noted on pacer check with 88 mode switches and longest episode >1 hour.  Now on Apixiban for [place] score of 5    PAST SURGICAL HISTORY: Past Surgical History:  Procedure Laterality Date   PACEMAKER INSERTION  2005, 04/16/12   MDT implanted by Dr Leonia Reeves with generator chnage (MDT Adapta L) by Dr Rayann Heman 04/16/12    PERMANENT PACEMAKER GENERATOR CHANGE N/A 04/16/2012   Procedure: PERMANENT PACEMAKER GENERATOR CHANGE;  Surgeon: Thompson Grayer, MD;  Location: [facility] CATH LAB;  Service: Cardiovascular;  Laterality: N/A;   RIGHT/LEFT HEART CATH AND CORONARY ANGIOGRAPHY N/A 10/18/2020   Procedure: RIGHT/LEFT HEART CATH AND CORONARY ANGIOGRAPHY;  Surgeon: Troy Sine, MD;  Location: [place] CV LAB;  Service: Cardiovascular;  Laterality: N/A;    FAMILY HISTORY: Family History  Problem Relation Age of Onset   Heart disease Father  Heart attack Father     SOCIAL HISTORY: Social History   Socioeconomic History   Marital status: Married    Spouse name: [name]   Number of children: 2   Years of education: BS   Highest education level: Not on file  Occupational History   Occupation: retired  Tobacco Use   Smoking status: Former    Packs/day: 1.00    Years: 25.00    Pack years: 25.00    Types: Cigarettes    Quit date: 09/16/1998    Years since quitting: 22.9   Smokeless tobacco: Never  Vaping Use   Vaping Use: Never used  Substance and Sexual Activity   Alcohol use: Yes    Alcohol/week: 5.0 standard drinks    Types: 2 Glasses of wine, 3 Shots of liquor per week    Comment: drinks a tequila shot daily   Drug use: No   Sexual activity: Not on file  Other Topics Concern   Not on file  Social History Narrative   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   Patient is married with 2 children.   Patient is right handed.   Patient has BS degree.   Patient drinks 1 cup daily.         Social Determinants of Health   Financial Resource Strain: Not on file  Food Insecurity: Not on file  Transportation Needs: Not on file  Physical Activity: Not on file  Stress: Not on file  Social Connections: Not on file  Intimate Partner Violence: Not on file      PHYSICAL EXAM  Vitals:   08/21/21 1554  BP: 106/61  Pulse: 76  Weight: 147 lb (66.7 kg)  Height: 5\' 8"  (1.727 m)    Body mass  index is 22.35 kg/m.   MMSE - Mini Mental State Exam 01/10/2021 07/11/2020 07/11/2020  Not completed: Unable to complete - Unable to complete  Orientation to time 1 0 1  Orientation to Place 1 1 1   Registration 2 3 -  Attention/ Calculation 0 0 -  Attention/Calculation-comments - - -  Recall 0 0 -  Language- name 2 objects 0 2 -  Language- repeat - 0 -  Language- follow 3 step command - 1 -  Language- follow 3 step command-comments - - -  Language- read & follow direction - 1 -  Write a sentence - 0 -  Copy design - 0 -  Copy design-comments - - -  Total score - 8 -     Generalized: Well developed, in no acute distress   Neurological examination  Mentation: Alert oriented to person.   Follows all commands speech and language fluent Cranial nerve II-XII: Pupils were equal round reactive to light. Extraocular movements were full, visual field were full on confrontational test.  Head turning and shoulder shrug  were normal and symmetric. Motor: The motor testing reveals 5 over 5 strength of all 4 extremities. Good symmetric motor tone is noted throughout.  Sensory: Sensory testing is intact to soft touch on all 4 extremities. No evidence of extinction is noted.  Coordination: Cerebellar testing reveals good finger-nose-finger and heel-to-shin bilaterally.  Gait and station: Gait is normal. Reflexes: Deep tendon reflexes are symmetric and normal bilaterally.   DIAGNOSTIC DATA (LABS, IMAGING, TESTING) - I reviewed patient records, labs, notes, testing and imaging myself where available.  Lab Results  Component Value Date   WBC 7.2 05/17/2021   HGB 15.6 05/17/2021   HCT 46.0 05/17/2021  MCV 86.9 05/17/2021   PLT 261 05/17/2021      Component Value Date/Time   NA 142 05/17/2021 2213   NA 146 (H) 12/06/2020 1610   K 4.2 05/17/2021 2213   CL 108 05/17/2021 2213   CO2 22 05/17/2021 2201   GLUCOSE 273 (H) 05/17/2021 2213   BUN 31 (H) 05/17/2021 2213   BUN 37 (H) 12/06/2020  1610   CREATININE 1.70 (H) 05/17/2021 2213   CALCIUM 9.0 05/17/2021 2201   PROT 6.0 (L) 05/17/2021 2201   PROT 5.9 (L) 12/16/2016 0822   ALBUMIN 3.6 05/17/2021 2201   ALBUMIN 4.3 12/16/2016 0822   AST 14 (L) 05/17/2021 2201   ALT 15 05/17/2021 2201   ALKPHOS 69 05/17/2021 2201   BILITOT 0.5 05/17/2021 2201   BILITOT 0.3 12/16/2016 0822   GFRNONAA 37 (L) 05/17/2021 2201   GFRAA 43 (L) 10/11/2020 1435   Lab Results  Component Value Date   CHOL 128 12/16/2016   HDL 55 12/16/2016   LDLCALC 58 12/16/2016   TRIG 74 12/16/2016   CHOLHDL 2.3 12/16/2016   Lab Results  Component Value Date   HGBA1C 6.7 (H) 11/16/2020   No results found for: VITAMINB12 No results found for: TSH    ASSESSMENT AND PLAN 87 y.o. year old male  has a past medical history of Allergic rhinitis, cause unspecified, BPH (benign prostatic hyperplasia), Carotid artery stenosis, asymptomatic, Cataracts, bilateral, Chronic kidney disease (CKD), stage III (moderate) ([place]), Complete heart block ([place]) (01/23/04), COPD (chronic obstructive pulmonary disease) ([place]), Diabetes mellitus, Glaucoma, Hyperlipidemia, Hypertension, Insomnia due to medical condition (08/10/2014), Kidney stones, and PAF (paroxysmal atrial fibrillation) ([place]) (10/09/2014). here with:  1.  Late onset Alzheimer's without behavioral disturbance  - Continue Aricept 10 mg at bedtime - Continue Namenda 10 mg BID - Advised if symptoms worsen or develop new symptoms then let us kow -FU in 6 months    Ward Givens, MSN, NP-C 08/21/2021, 8:01 AM [facility] [address], [place], [place] [address] [phone]

## 2021-09-04 DIAGNOSIS — N1832 Chronic kidney disease, stage 3b: Secondary | ICD-10-CM | POA: Diagnosis not present

## 2021-09-04 DIAGNOSIS — F0390 Unspecified dementia without behavioral disturbance: Secondary | ICD-10-CM | POA: Diagnosis not present

## 2021-09-04 DIAGNOSIS — I25810 Atherosclerosis of coronary artery bypass graft(s) without angina pectoris: Secondary | ICD-10-CM | POA: Diagnosis not present

## 2021-09-04 DIAGNOSIS — E1122 Type 2 diabetes mellitus with diabetic chronic kidney disease: Secondary | ICD-10-CM | POA: Diagnosis not present

## 2021-09-04 DIAGNOSIS — I480 Paroxysmal atrial fibrillation: Secondary | ICD-10-CM | POA: Diagnosis not present

## 2021-09-04 DIAGNOSIS — E782 Mixed hyperlipidemia: Secondary | ICD-10-CM | POA: Diagnosis not present

## 2021-09-04 DIAGNOSIS — I10 Essential (primary) hypertension: Secondary | ICD-10-CM | POA: Diagnosis not present

## 2021-09-04 DIAGNOSIS — E039 Hypothyroidism, unspecified: Secondary | ICD-10-CM | POA: Diagnosis not present

## 2021-09-04 DIAGNOSIS — J439 Emphysema, unspecified: Secondary | ICD-10-CM | POA: Diagnosis not present

## 2021-09-04 DIAGNOSIS — I255 Ischemic cardiomyopathy: Secondary | ICD-10-CM | POA: Diagnosis not present

## 2021-09-18 ENCOUNTER — Ambulatory Visit: Payer: PPO | Admitting: Adult Health

## 2021-10-02 ENCOUNTER — Other Ambulatory Visit: Payer: Self-pay | Admitting: Cardiology

## 2021-10-02 NOTE — Telephone Encounter (Signed)
Prescription refill request for Eliquis received. Indication: afib  Last office visit: 05/16/2021, All[place] Scr: 05/17/2021, 1.7 Age: 88 yo  Weight: 66.7 kg   Refill sent.

## 2021-10-04 ENCOUNTER — Other Ambulatory Visit: Payer: Self-pay

## 2021-10-04 ENCOUNTER — Encounter: Payer: Self-pay | Admitting: Cardiology

## 2021-10-04 ENCOUNTER — Ambulatory Visit: Payer: PPO | Admitting: Cardiology

## 2021-10-04 VITALS — BP 120/64 | HR 72 | Ht 68.0 in | Wt 146.0 lb

## 2021-10-04 DIAGNOSIS — I480 Paroxysmal atrial fibrillation: Secondary | ICD-10-CM

## 2021-10-04 DIAGNOSIS — I2510 Atherosclerotic heart disease of native coronary artery without angina pectoris: Secondary | ICD-10-CM | POA: Diagnosis not present

## 2021-10-04 DIAGNOSIS — E7800 Pure hypercholesterolemia, unspecified: Secondary | ICD-10-CM

## 2021-10-04 DIAGNOSIS — I5020 Unspecified systolic (congestive) heart failure: Secondary | ICD-10-CM

## 2021-10-04 DIAGNOSIS — I10 Essential (primary) hypertension: Secondary | ICD-10-CM

## 2021-10-04 DIAGNOSIS — I6523 Occlusion and stenosis of bilateral carotid arteries: Secondary | ICD-10-CM

## 2021-10-04 DIAGNOSIS — I420 Dilated cardiomyopathy: Secondary | ICD-10-CM | POA: Diagnosis not present

## 2021-10-04 DIAGNOSIS — I442 Atrioventricular block, complete: Secondary | ICD-10-CM | POA: Diagnosis not present

## 2021-10-04 DIAGNOSIS — I2583 Coronary atherosclerosis due to lipid rich plaque: Secondary | ICD-10-CM | POA: Diagnosis not present

## 2021-10-04 NOTE — Patient Instructions (Signed)
Medication Instructions:  Your physician recommends that you continue on your current medications as directed. Please refer to the Current Medication list given to you today.  *If you need a refill on your cardiac medications before your next appointment, please call your pharmacy*  Follow-Up: At [facility], you and your health needs are our priority.  As part of our continuing mission to provide you with exceptional heart care, we have created designated Provider Care Teams.  These Care Teams include your primary Cardiologist (physician) and Advanced Practice Providers (APPs -  Physician Assistants and Nurse Practitioners) who all work together to provide you with the care you need, when you need it.  Your next appointment:   6 month(s)  The format for your next appointment:   In Person  Provider:   Traci Turner, MD   

## 2021-10-04 NOTE — Progress Notes (Signed)
Date:  10/04/2021   ID:  Edward Suarez, DOB 02-08-1932, MRN [id number]  Patient Location:  Home  Provider location:   [place]  PCP:  Renford Dills, MD  Cardiologist:  Armanda Magic, MD  Electrophysiologist:  None   Chief Complaint:  HTN, PAF, Hyperlipidemia and carotid stenosis  History of Present Illness:    Edward Suarez is a 88 y.o. male with a hx of  HTN, DM, dyslipidemia, complete HB s/p PPM and PVC's, mild (1-39% bilateral) carotid artery stenosis and is followed by Dr. Darrick Penna. He also has  PAF noted on pacer check in 2016 and due to [facility] score of 5, was started on Eliquis.   He was hospitalized in Dec 2021 with chest pain.  EKG showed paced rhythm and while in radiology had a near syncopal episode with no arrhythmia on pacer interrogation.  Cardiology was consulted and 2D echo showed moderate LV dysfunction with EF 30% with AK of all mid to apical segments and LV apex.  After discussion with patient he did not want any further inpt evaluation and was discharged home with plan to followup in office to discuss ischemic workup given normal hsTrop during hospital stay.  It was felt that RV pacing with abnormal septal motion may be exaggerating the degree of LV dysfunction.  Due to his advanced age, confusion and restlessness in hospital, he was felt not to be an ideal candidate for cath.    He underwent Lexiscan myoview 09/2020 showing EF 27% with large inferior infarct with minimal peri-infarct ischemia felt to be a high risk scan.  He underwent right and left heart cath 10/18/2020 showing 40% prox to mid RCA, 30% mid LAD and 50% mid LAD with low filling pressures and EF 30% and medical management was recommended.  His losartan was changed to [place] added.  He is here today for followup and is doing well.  He he  denies any chest pain or pressure, SOB, DOE, PND, orthopnea, LE edema, dizziness, palpitations or syncope.  He is compliant with his meds and is  tolerating meds with no SE.     Prior CV studies:   The following studies were reviewed today:  Cardiac Cath 10/18/2020 Conclusion    Prox RCA to Mid RCA lesion is 40% stenosed. Mid LAD-1 lesion is 30% stenosed. Mid LAD-2 lesion is 50% stenosed.   Low normal right heart pressures with mean PA pressure 12 mmHg.   Mild to moderate CAD with mild diffuse 30% and focal 50% mid LAD stenoses; normal left circumflex, and 40% smooth mid RCA stenosis.   Echo documentation of a dilated cardiomyopathy with EF estimate at 30% with wall motion abnormalities as noted apically, inferiorly, and mid septally.   RECOMMENDATION: Gentle hydration post-cath particularly with the patient's stage IIIb CKD.  GDMT for reduced LV function mild to moderate CAD.  Resume Eliquis tomorrow.  Aggressive lipid-lowering therapy with target LDL less than 70.  2D echo 08/2020 IMPRESSIONS    1. Left ventricular ejection fraction, by estimation, is 30%. The left  ventricle has moderate to severely decreased function. There is akinesis  of all mid-to-apical septal segments, mid-to-apical inferior segments, and  the LV apex. The mid-to-apical  inferolateral segments are severely hypokinetic. The rest of the LV  segments are moderately hypokinetic. The left ventricular internal cavity  size was mildly dilated. There is mild concentric left ventricular  hypertrophy. Left ventricular diastolic  parameters are consistent with Grade I diastolic dysfunction (impaired  relaxation).   2. Right ventricular systolic function is normal. The right ventricular  size is normal.   3. Left atrial size was moderately dilated.   4. The mitral valve is abnormal. Mild mitral valve regurgitation.   5. The aortic valve is tricuspid. There is mild calcification of the  aortic valve. There is mild thickening of the aortic valve. Aortic valve  regurgitation is trivial.   6. The inferior vena cava is normal in size with greater than 50%   respiratory variability, suggesting right atrial pressure of 3 mmHg.   Comparison(s): Compared to prior echo report in 2014, the LVEF is now  moderately-to-severely reduced with regional wall motion abnormalities as  detailed above.   Past Medical History:  Diagnosis Date   Allergic rhinitis, cause unspecified    BPH (benign prostatic hyperplasia)    Carotid artery stenosis, asymptomatic    1-39% by dopplers 09/2016 followed by Dr. Oneida Alar   Cataracts, bilateral    Chronic kidney disease (CKD), stage III (moderate) ([place])    Complete heart block ([place]) 01/23/04   s/p Medtronic PPM implanted by Dr Leonia Reeves   COPD (chronic obstructive pulmonary disease) ([place])    Diabetes mellitus    Glaucoma    Hyperlipidemia    Hypertension    Insomnia due to medical condition 08/10/2014   Kidney stones    PAF (paroxysmal atrial fibrillation) ([place]) 10/09/2014   Noted on pacer check with 88 mode switches and longest episode >1 hour.  Now on Apixiban for [place] score of 5   Past Surgical History:  Procedure Laterality Date   PACEMAKER INSERTION  2005, 04/16/12   MDT implanted by Dr Leonia Reeves with generator chnage (MDT Adapta L) by Dr Rayann Heman 04/16/12   PERMANENT PACEMAKER GENERATOR CHANGE N/A 04/16/2012   Procedure: PERMANENT PACEMAKER GENERATOR CHANGE;  Surgeon: Thompson Grayer, MD;  Location: [facility] CATH LAB;  Service: Cardiovascular;  Laterality: N/A;   RIGHT/LEFT HEART CATH AND CORONARY ANGIOGRAPHY N/A 10/18/2020   Procedure: RIGHT/LEFT HEART CATH AND CORONARY ANGIOGRAPHY;  Surgeon: Troy Sine, MD;  Location: [place] CV LAB;  Service: Cardiovascular;  Laterality: N/A;     Current Meds  Medication Sig   atorvastatin (LIPITOR) 80 MG tablet Take 1 tablet (80 mg total) by mouth every evening.   Calcium Carb-Cholecalciferol (CALCIUM 600 + D PO) Take 1 tablet by mouth daily. 10 mcg vit d   cetirizine (ZYRTEC) 10 MG tablet Take 10 mg by mouth daily as needed for allergies. For allergies. Takes prn    dapagliflozin propanediol (FARXIGA) 10 MG TABS tablet Take 1 tablet (10 mg total) by mouth daily.   donepezil (ARICEPT) 10 MG tablet TAKE ONE TABLET BY MOUTH EVERY EVENING   fluticasone (FLONASE) 50 MCG/ACT nasal spray Place 2 sprays into both nostrils daily as needed for allergies or rhinitis.   isosorbide mononitrate (IMDUR) 30 MG 24 hr tablet Take 15 mg by mouth daily.   loratadine (CLARITIN) 10 MG tablet Take 10 mg by mouth daily as needed for allergies.   memantine (NAMENDA) 10 MG tablet Take 1 tablet (10 mg total) by mouth 2 (two) times daily.   metoprolol succinate (TOPROL-XL) 25 MG 24 hr tablet Take 12.5 mg by mouth daily.   nitroGLYCERIN (NITROSTAT) 0.4 MG SL tablet Place 1 tablet (0.4 mg total) under the tongue every 5 (five) minutes as needed for chest pain.   prednisoLONE acetate (PRED FORTE) 1 % ophthalmic suspension Place 1 drop into both eyes as needed.   SYNTHROID 75  MCG tablet Take 75 mcg by mouth daily.   valsartan (DIOVAN) 40 MG tablet Take 1 tablet (40 mg total) by mouth daily.     Allergies:   Penicillins, Penicillin g benzathine, and Penicillins   Social History   Tobacco Use   Smoking status: Former    Packs/day: 1.00    Years: 25.00    Pack years: 25.00    Types: Cigarettes    Quit date: 09/16/1998    Years since quitting: 23.0   Smokeless tobacco: Never  Vaping Use   Vaping Use: Never used  Substance Use Topics   Alcohol use: Not Currently    Comment: 1-2 a month glasses of wine   Drug use: No     Family Hx: The patient's family history includes Alzheimer's disease in his sister; Heart attack in his father; Heart disease in his father.  ROS:   Please see the history of present illness.     All other systems reviewed and are negative.   Labs/Other Tests and Data Reviewed:    Recent Labs: 05/17/2021: ALT 15; BUN 31; Creatinine, Ser 1.70; Hemoglobin 15.6; Platelets 261; Potassium 4.2; Sodium 142   Recent Lipid Panel Lab Results  Component Value  Date/Time   CHOL 128 12/16/2016 08:22 AM   TRIG 74 12/16/2016 08:22 AM   HDL 55 12/16/2016 08:22 AM   CHOLHDL 2.3 12/16/2016 08:22 AM   CHOLHDL 2.7 07/08/2016 08:02 AM   LDLCALC 58 12/16/2016 08:22 AM    Wt Readings from Last 3 Encounters:  10/04/21 146 lb (66.2 kg)  08/21/21 147 lb (66.7 kg)  05/17/21 160 lb (72.6 kg)     Objective:    Vital Signs:  BP 120/64    Pulse 72    Ht 5\' 8"  (1.727 m)    Wt 146 lb (66.2 kg)    SpO2 96%    BMI 22.20 kg/m    GEN: Well nourished, well developed in no acute distress HEENT: Normal NECK: No JVD; No carotid bruits LYMPHATICS: No lymphadenopathy CARDIAC:RRR, no murmurs, rubs, gallops RESPIRATORY:  Clear to auscultation without rales, wheezing or rhonchi  ABDOMEN: Soft, non-tender, non-distended MUSCULOSKELETAL:  No edema; No deformity  SKIN: Warm and dry NEUROLOGIC:  Alert and oriented x 3 PSYCHIATRIC:  Normal affect   ASSESSMENT & PLAN:    1.  ASCAD -2D echo with new wall motion abnormality in the mid to apical inferior wall>>EF appeared worse than prior scan -nuclear stress test was high risk -right and left heart cath 10/18/2020 showing 40% prox to mid RCA, 30% mid LAD and 50% mid LAD with low filling pressures and EF 30% and medical management was recommended.   -He has done well since I saw him last and denies any anginal symptoms -Continue prescription drug management with Imdur 15 mg daily, Toprol-XL 12.5 mg daily and high-dose statin with as needed refills -He is not on aspirin due to [place] for his A. fib  2.  Bilateral carotid artery stenosis -He had dopplers done 11/2018 showing 1-39% bilateral stenosis.  -continue statin -no ASA due to DOAC -Given his advanced age will not pursue repeat Dopplers at this time>>his wife agrees with conservative approach  3.  Complete HB  -he is s/p PPM followed in device clinic.   -device interrogation in hospital Dec 2021 after syncope in Radiology with no arrhythmia noted  4.  HTN  -His BP  is well controlled on exam today -He will continue on Farxiga 10 mg daily, Imdur 15  mg daily, Toprol-XL 12.5 mg daily with as needed refills  5.  PAF  -He remains in normal sinus rhythm on exam today and denies any palpitations  -I have personally reviewed and interpreted outside labs performed by patient's PCP which showed serum creatinine 1.71 and potassium 4.3 on 07/11/2021 as well as hemoglobin 14.1 -Continue prescription drug management with Toprol-XL 12.5 mg daily and Eliquis 2.5 mg twice daily (dosed for age greater than 63 and serum creatinine greater than 1.5_with as needed refills  6.  Hyperlipidemia  -his LDL goal is < 70. -I have personally reviewed and interpreted outside labs performed by patient's PCP which showed LDL 58, HDL 47, triglycerides 132 on 07/11/2021 -Continue prescription drug management with atorvastatin 80 mg daily with as needed refills  7.  Mixed ischemic non-ischemic DCM/chronic systolic CHF -EF had been 45-50% on echo in 2014 and recently read as 30-35% -cath with mild to moderate nonobstructive CAD 10/2020 and DCM out of proportion to degree of CAD -He appears euvolemic on exam his weight is stable -His wife stopped his Entresto due to cost and started Valsartan -Continue prescription drug management with Farxiga 10 mg daily, Imdur 15 mg daily, Toprol-XL 12.5 mg daily, valsartan 40 mg daily with as needed refills -not a candidate for AICD given advanced age  Followup with me in 6 months   Medication Adjustments/Labs and Tests Ordered: Current medicines are reviewed at length with the patient today.  Concerns regarding medicines are outlined above.  Tests Ordered: No orders of the defined types were placed in this encounter.   Medication Changes: No orders of the defined types were placed in this encounter.    Disposition:  Follow up in 6 month(s)  Signed, Fransico Him, MD  10/04/2021 11:15 AM    [place]

## 2021-10-05 DIAGNOSIS — J439 Emphysema, unspecified: Secondary | ICD-10-CM | POA: Diagnosis not present

## 2021-10-05 DIAGNOSIS — E039 Hypothyroidism, unspecified: Secondary | ICD-10-CM | POA: Diagnosis not present

## 2021-10-05 DIAGNOSIS — E7800 Pure hypercholesterolemia, unspecified: Secondary | ICD-10-CM | POA: Diagnosis not present

## 2021-10-05 DIAGNOSIS — I5020 Unspecified systolic (congestive) heart failure: Secondary | ICD-10-CM | POA: Diagnosis not present

## 2021-10-05 DIAGNOSIS — N1832 Chronic kidney disease, stage 3b: Secondary | ICD-10-CM | POA: Diagnosis not present

## 2021-10-05 DIAGNOSIS — I10 Essential (primary) hypertension: Secondary | ICD-10-CM | POA: Diagnosis not present

## 2021-10-05 DIAGNOSIS — F0390 Unspecified dementia without behavioral disturbance: Secondary | ICD-10-CM | POA: Diagnosis not present

## 2021-10-05 DIAGNOSIS — E1122 Type 2 diabetes mellitus with diabetic chronic kidney disease: Secondary | ICD-10-CM | POA: Diagnosis not present

## 2021-10-05 DIAGNOSIS — I480 Paroxysmal atrial fibrillation: Secondary | ICD-10-CM | POA: Diagnosis not present

## 2021-10-05 DIAGNOSIS — N183 Chronic kidney disease, stage 3 unspecified: Secondary | ICD-10-CM | POA: Diagnosis not present

## 2021-10-05 DIAGNOSIS — I25810 Atherosclerosis of coronary artery bypass graft(s) without angina pectoris: Secondary | ICD-10-CM | POA: Diagnosis not present

## 2021-10-07 LAB — CUP PACEART REMOTE DEVICE CHECK
Battery Impedance: 2535 Ohm
Battery Remaining Longevity: 17 mo
Battery Voltage: 2.71 V
Brady Statistic AP VP Percent: 95 %
Brady Statistic AP VS Percent: 0 %
Brady Statistic AS VP Percent: 5 %
Brady Statistic AS VS Percent: 0 %
Date Time Interrogation Session: 20230120103207
Implantable Lead Implant Date: 20050509
Implantable Lead Implant Date: 20050509
Implantable Lead Location: 753859
Implantable Lead Location: 753860
Implantable Lead Model: 5076
Implantable Lead Model: 5092
Implantable Pulse Generator Implant Date: 20130801
Lead Channel Impedance Value: 440 Ohm
Lead Channel Impedance Value: 526 Ohm
Lead Channel Pacing Threshold Amplitude: 0.5 V
Lead Channel Pacing Threshold Amplitude: 1.125 V
Lead Channel Pacing Threshold Pulse Width: 0.4 ms
Lead Channel Pacing Threshold Pulse Width: 0.4 ms
Lead Channel Setting Pacing Amplitude: 2 V
Lead Channel Setting Pacing Amplitude: 3.5 V
Lead Channel Setting Pacing Pulse Width: 0.4 ms
Lead Channel Setting Sensing Sensitivity: 4 mV

## 2021-10-08 ENCOUNTER — Telehealth: Payer: Self-pay

## 2021-10-08 ENCOUNTER — Ambulatory Visit (INDEPENDENT_AMBULATORY_CARE_PROVIDER_SITE_OTHER): Payer: PPO

## 2021-10-08 DIAGNOSIS — I420 Dilated cardiomyopathy: Secondary | ICD-10-CM | POA: Diagnosis not present

## 2021-10-08 NOTE — Telephone Encounter (Addendum)
Scheduled remote reviewed. Normal device function.   There was one atrial arrhythmia detected that was one hour and 39 minutes on 09/13/2021.  ? OAC, sent to triage. Next remote 91 days.  Hassell Halim, RN, CCDS, [employer]    Called to confirm if patient was taking Eliquis, listed under Dr. Malachy Mood note to continue Eliquis but Eliquis is marked out " patient not taking"

## 2021-10-10 NOTE — Telephone Encounter (Signed)
Second unsuccessful telephone encounter to patient to follow up on Eliquis compliance secondary to recent episode of PAF. [employer] compliant VM message left requesting call back to [phone]

## 2021-10-12 ENCOUNTER — Telehealth: Payer: Self-pay

## 2021-10-12 DIAGNOSIS — E1122 Type 2 diabetes mellitus with diabetic chronic kidney disease: Secondary | ICD-10-CM | POA: Diagnosis not present

## 2021-10-12 DIAGNOSIS — D6869 Other thrombophilia: Secondary | ICD-10-CM | POA: Diagnosis not present

## 2021-10-12 DIAGNOSIS — I110 Hypertensive heart disease with heart failure: Secondary | ICD-10-CM | POA: Diagnosis not present

## 2021-10-12 DIAGNOSIS — N183 Chronic kidney disease, stage 3 unspecified: Secondary | ICD-10-CM | POA: Diagnosis not present

## 2021-10-12 DIAGNOSIS — Z515 Encounter for palliative care: Secondary | ICD-10-CM | POA: Diagnosis not present

## 2021-10-12 DIAGNOSIS — E1151 Type 2 diabetes mellitus with diabetic peripheral angiopathy without gangrene: Secondary | ICD-10-CM | POA: Diagnosis not present

## 2021-10-12 DIAGNOSIS — F0390 Unspecified dementia without behavioral disturbance: Secondary | ICD-10-CM | POA: Diagnosis not present

## 2021-10-12 DIAGNOSIS — E1165 Type 2 diabetes mellitus with hyperglycemia: Secondary | ICD-10-CM | POA: Diagnosis not present

## 2021-10-12 DIAGNOSIS — I480 Paroxysmal atrial fibrillation: Secondary | ICD-10-CM | POA: Diagnosis not present

## 2021-10-12 DIAGNOSIS — I5022 Chronic systolic (congestive) heart failure: Secondary | ICD-10-CM | POA: Diagnosis not present

## 2021-10-12 DIAGNOSIS — I2510 Atherosclerotic heart disease of native coronary artery without angina pectoris: Secondary | ICD-10-CM | POA: Diagnosis not present

## 2021-10-12 DIAGNOSIS — J449 Chronic obstructive pulmonary disease, unspecified: Secondary | ICD-10-CM | POA: Diagnosis not present

## 2021-10-12 NOTE — Telephone Encounter (Signed)
Edward Suarez (dpr) was returning nurse call. When I put them on hold she hung up. They called back and I could barely hear them and they hung up again.

## 2021-10-12 NOTE — Telephone Encounter (Signed)
Spoke with patient's wife, [name] (DPR on file).  She confirmed that patient is currently taking Eliqius.

## 2021-10-18 NOTE — Progress Notes (Signed)
Remote pacemaker transmission.   

## 2021-11-02 DIAGNOSIS — N1832 Chronic kidney disease, stage 3b: Secondary | ICD-10-CM | POA: Diagnosis not present

## 2021-11-02 DIAGNOSIS — E7800 Pure hypercholesterolemia, unspecified: Secondary | ICD-10-CM | POA: Diagnosis not present

## 2021-11-02 DIAGNOSIS — E039 Hypothyroidism, unspecified: Secondary | ICD-10-CM | POA: Diagnosis not present

## 2021-11-02 DIAGNOSIS — I480 Paroxysmal atrial fibrillation: Secondary | ICD-10-CM | POA: Diagnosis not present

## 2021-11-02 DIAGNOSIS — I5020 Unspecified systolic (congestive) heart failure: Secondary | ICD-10-CM | POA: Diagnosis not present

## 2021-11-02 DIAGNOSIS — I10 Essential (primary) hypertension: Secondary | ICD-10-CM | POA: Diagnosis not present

## 2021-11-02 DIAGNOSIS — E1122 Type 2 diabetes mellitus with diabetic chronic kidney disease: Secondary | ICD-10-CM | POA: Diagnosis not present

## 2021-12-12 DIAGNOSIS — E1122 Type 2 diabetes mellitus with diabetic chronic kidney disease: Secondary | ICD-10-CM | POA: Diagnosis not present

## 2021-12-14 DIAGNOSIS — I10 Essential (primary) hypertension: Secondary | ICD-10-CM | POA: Diagnosis not present

## 2021-12-14 DIAGNOSIS — I5020 Unspecified systolic (congestive) heart failure: Secondary | ICD-10-CM | POA: Diagnosis not present

## 2021-12-14 DIAGNOSIS — E782 Mixed hyperlipidemia: Secondary | ICD-10-CM | POA: Diagnosis not present

## 2021-12-14 DIAGNOSIS — E1122 Type 2 diabetes mellitus with diabetic chronic kidney disease: Secondary | ICD-10-CM | POA: Diagnosis not present

## 2021-12-26 ENCOUNTER — Other Ambulatory Visit: Payer: Self-pay | Admitting: Cardiology

## 2021-12-27 ENCOUNTER — Telehealth: Payer: Self-pay | Admitting: Pharmacist

## 2021-12-27 DIAGNOSIS — I5020 Unspecified systolic (congestive) heart failure: Secondary | ICD-10-CM

## 2021-12-27 MED ORDER — DAPAGLIFLOZIN PROPANEDIOL 10 MG PO TABS: 10.0000 mg | ORAL_TABLET | Freq: Every day | ORAL | 0 refills | Status: DC

## 2021-12-27 MED ORDER — DAPAGLIFLOZIN PROPANEDIOL 10 MG PO TABS: 10.0000 mg | ORAL_TABLET | Freq: Every day | ORAL | 3 refills | Status: DC

## 2021-12-27 NOTE — Telephone Encounter (Signed)
Spoke with patient's wife and let them know we sent in updated Rx.  Will provide sample for patient to make sure he does not miss any doses ?

## 2021-12-27 NOTE — Telephone Encounter (Signed)
Called and spoke to a representative of [place] and me to ask what the status of the shipment and they stated that they are still approved til 09/15/22 but they needed a prescription sent to [facility] in which I completed. Since they are on their last week of medication and it can take a week for them to process based on what they told me, the pt could possibly benefit from receiving a sample. I will route to chst pharmd as they are a pt of dr. Radford Pax and if they have farxiga on hand they could offer a sample to the pt when they tell them that we sent the rx to resolve the issue.  ?

## 2021-12-27 NOTE — Telephone Encounter (Signed)
Patient's wife left me a message regarding her husband's [place].  Had been receiving medication through patient assistance.  Is down to his last week of medication. Can you please call Edward Suarez patient assistance to see if they are planning on sending him any more medication or if we need to submit a new application? ? ?Wife is Edward Suarez: [phone] ?

## 2022-01-02 DIAGNOSIS — I480 Paroxysmal atrial fibrillation: Secondary | ICD-10-CM | POA: Diagnosis not present

## 2022-01-02 DIAGNOSIS — I10 Essential (primary) hypertension: Secondary | ICD-10-CM | POA: Diagnosis not present

## 2022-01-02 DIAGNOSIS — E7800 Pure hypercholesterolemia, unspecified: Secondary | ICD-10-CM | POA: Diagnosis not present

## 2022-01-02 DIAGNOSIS — E1122 Type 2 diabetes mellitus with diabetic chronic kidney disease: Secondary | ICD-10-CM | POA: Diagnosis not present

## 2022-01-02 DIAGNOSIS — I25810 Atherosclerosis of coronary artery bypass graft(s) without angina pectoris: Secondary | ICD-10-CM | POA: Diagnosis not present

## 2022-01-02 DIAGNOSIS — E039 Hypothyroidism, unspecified: Secondary | ICD-10-CM | POA: Diagnosis not present

## 2022-01-02 DIAGNOSIS — I5020 Unspecified systolic (congestive) heart failure: Secondary | ICD-10-CM | POA: Diagnosis not present

## 2022-01-02 DIAGNOSIS — J439 Emphysema, unspecified: Secondary | ICD-10-CM | POA: Diagnosis not present

## 2022-01-02 DIAGNOSIS — N1832 Chronic kidney disease, stage 3b: Secondary | ICD-10-CM | POA: Diagnosis not present

## 2022-01-07 ENCOUNTER — Ambulatory Visit (INDEPENDENT_AMBULATORY_CARE_PROVIDER_SITE_OTHER): Payer: PPO

## 2022-01-07 DIAGNOSIS — I420 Dilated cardiomyopathy: Secondary | ICD-10-CM

## 2022-01-08 LAB — CUP PACEART REMOTE DEVICE CHECK
Battery Impedance: 2799 Ohm
Battery Remaining Longevity: 15 mo
Battery Voltage: 2.7 V
Brady Statistic AP VP Percent: 96 %
Brady Statistic AP VS Percent: 0 %
Brady Statistic AS VP Percent: 4 %
Brady Statistic AS VS Percent: 0 %
Date Time Interrogation Session: 20230424171406
Implantable Lead Implant Date: 20050509
Implantable Lead Implant Date: 20050509
Implantable Lead Location: 753859
Implantable Lead Location: 753860
Implantable Lead Model: 5076
Implantable Lead Model: 5092
Implantable Pulse Generator Implant Date: 20130801
Lead Channel Impedance Value: 397 Ohm
Lead Channel Impedance Value: 635 Ohm
Lead Channel Pacing Threshold Amplitude: 0.5 V
Lead Channel Pacing Threshold Amplitude: 1.375 V
Lead Channel Pacing Threshold Pulse Width: 0.4 ms
Lead Channel Pacing Threshold Pulse Width: 0.4 ms
Lead Channel Setting Pacing Amplitude: 2 V
Lead Channel Setting Pacing Amplitude: 3.5 V
Lead Channel Setting Pacing Pulse Width: 0.4 ms
Lead Channel Setting Sensing Sensitivity: 4 mV

## 2022-01-16 DIAGNOSIS — D6869 Other thrombophilia: Secondary | ICD-10-CM | POA: Diagnosis not present

## 2022-01-16 DIAGNOSIS — I480 Paroxysmal atrial fibrillation: Secondary | ICD-10-CM | POA: Diagnosis not present

## 2022-01-16 DIAGNOSIS — I700 Atherosclerosis of aorta: Secondary | ICD-10-CM | POA: Diagnosis not present

## 2022-01-16 DIAGNOSIS — Z1331 Encounter for screening for depression: Secondary | ICD-10-CM | POA: Diagnosis not present

## 2022-01-16 DIAGNOSIS — E1122 Type 2 diabetes mellitus with diabetic chronic kidney disease: Secondary | ICD-10-CM | POA: Diagnosis not present

## 2022-01-16 DIAGNOSIS — Z0000 Encounter for general adult medical examination without abnormal findings: Secondary | ICD-10-CM | POA: Diagnosis not present

## 2022-01-16 DIAGNOSIS — E039 Hypothyroidism, unspecified: Secondary | ICD-10-CM | POA: Diagnosis not present

## 2022-01-16 DIAGNOSIS — I5020 Unspecified systolic (congestive) heart failure: Secondary | ICD-10-CM | POA: Diagnosis not present

## 2022-01-16 DIAGNOSIS — Z66 Do not resuscitate: Secondary | ICD-10-CM | POA: Diagnosis not present

## 2022-01-16 DIAGNOSIS — N1832 Chronic kidney disease, stage 3b: Secondary | ICD-10-CM | POA: Diagnosis not present

## 2022-01-16 DIAGNOSIS — E7800 Pure hypercholesterolemia, unspecified: Secondary | ICD-10-CM | POA: Diagnosis not present

## 2022-01-16 DIAGNOSIS — I10 Essential (primary) hypertension: Secondary | ICD-10-CM | POA: Diagnosis not present

## 2022-01-22 ENCOUNTER — Telehealth: Payer: Self-pay

## 2022-01-22 NOTE — Telephone Encounter (Signed)
**Note De-Identified  Obfuscation** The pts wife and DPR sent [place] a MYCHART message through her [facility] account concerning the pts Farxiga/[place] and [place] application and requesting samples. ? ?I have not received an [place] and [place] Pt asst application for Farxiga from the pt. ? ?I called to discuss but got no answer so I left a message asking the pt or his wife Darryll Capers to call Jeani Hawking back at Dr Theodosia Blender office at [facility] at [phone]. ? ?I have also sent them a message through the pts wife, [name]'s MYCHART account. ?

## 2022-01-22 NOTE — Telephone Encounter (Signed)
Leaving pt 2 boxes of Farxiga 10 mg tablets at the front desk for pt to pick up at [facility].  Lot: [id number]  Exp: 06/15/2024 Larita Fife, LPN, FYI ?Pavero, Christopher, [facility]  You 10 minutes ago (9:17 AM)  ? ?It's because she is writing the message for her husband Edward Suarez MRN:  [id number]   ? ?You  Cv Div Pharmd 1 hour ago (7:28 AM)  ? ?Pt's medication Marcelline Deist is not listed on pt's medication list. Can you please list this on the pt's medication list , so it shows on the pt's mediation list that the pt is taking this medication.  ? ?Thanks, Rheagan Nayak,CMA   ? ?Malena Peer D, RPH-CPP  You 21 hours ago (11:53 AM)  ? ?Can you please leave 2 weeks of Farxgia for this patient. Thanks you   ? ?Malena Peer D, RPH-CPP  Nat Christen 21 hours ago (11:52 AM)  ? ?Please call  [phone]) to follow up on the status of your application. They are usually very quick to respond. Please make sure they have your application and are not waiting on any other information. ?We can leave 2 weeks of Farxiga up front for you. ?  ?Efraim Kaufmann, PharmD ?  ? ?Theresia Majors, RN routed conversation to [employer] (8:20 AM)  ? ?Theresia Majors, RN routed conversation to Theresia Majors, Environmental health practitioner (7:53 AM)  ? ?Edward Suarez  P Cv Div Ch St Triage (supporting Quintella Reichert, MD) Yesterday (6:54 AM)  ? ?I am down to two sample pills.  No word or prescription from the [place] maker.  Perhaps they do not wat [place] to participate in their drug assistance program.  If so, please provide more samples or prescribe a medicine that is similar, but more affordable.  Many thanks ?

## 2022-01-23 NOTE — Telephone Encounter (Signed)
Form has been faxed.

## 2022-01-23 NOTE — Telephone Encounter (Signed)
**Note De-Identified Talina Pleitez Obfuscation** Per [facility] message from the pts wife [name] through her [facility] account this am: ? ?Nat Christen ?to P Cv Div Ch St Triage (supporting Briscoe Daniello, Lorelle Formosa, LPN)   ?10:22 AM ?At last!  Help is on the way.  I have determined that Sachit's prescription will be continued but we will not be receiving  our shipment for at least  two weeks.  This application was done by telephone. ? I would like to pick up samples.   Tell me where to go to pick up at least a two week supply. ?  ?Thanks  for your continued  help. ?[name] ?  ?Conversation: Marcelline Deist Assistance ?(Newest Message First) ?Jan 23, 2022 ?Me ?to Nat Christen   ? 11:31 AM ?Renee Rival!!! ?I will also fax them a signed providers page of their application and a [place] prescription. ?We have left Bernette Mayers 2 weeks of Farxiga samples in the front office at [employer] at [address] in [place] for you guys to pick up. ?Thank you for letting us know of this need. ?Elease Hashimoto ?

## 2022-01-23 NOTE — Progress Notes (Signed)
Remote pacemaker transmission.   

## 2022-01-29 ENCOUNTER — Telehealth: Payer: Self-pay | Admitting: Adult Health

## 2022-01-29 NOTE — Telephone Encounter (Signed)
Called pt to r/s 6/14 appt (NP out PM), was leaving VM and pt hung up. Unable to reach 2nd time. ?

## 2022-02-01 DIAGNOSIS — I10 Essential (primary) hypertension: Secondary | ICD-10-CM | POA: Diagnosis not present

## 2022-02-01 DIAGNOSIS — N1832 Chronic kidney disease, stage 3b: Secondary | ICD-10-CM | POA: Diagnosis not present

## 2022-02-01 DIAGNOSIS — E1122 Type 2 diabetes mellitus with diabetic chronic kidney disease: Secondary | ICD-10-CM | POA: Diagnosis not present

## 2022-02-01 DIAGNOSIS — E7800 Pure hypercholesterolemia, unspecified: Secondary | ICD-10-CM | POA: Diagnosis not present

## 2022-02-01 DIAGNOSIS — E782 Mixed hyperlipidemia: Secondary | ICD-10-CM | POA: Diagnosis not present

## 2022-02-01 DIAGNOSIS — I480 Paroxysmal atrial fibrillation: Secondary | ICD-10-CM | POA: Diagnosis not present

## 2022-02-01 DIAGNOSIS — E039 Hypothyroidism, unspecified: Secondary | ICD-10-CM | POA: Diagnosis not present

## 2022-02-09 DIAGNOSIS — J069 Acute upper respiratory infection, unspecified: Secondary | ICD-10-CM | POA: Diagnosis not present

## 2022-02-27 ENCOUNTER — Ambulatory Visit: Payer: PPO | Admitting: Adult Health

## 2022-02-27 ENCOUNTER — Other Ambulatory Visit: Payer: Self-pay | Admitting: Adult Health

## 2022-03-01 DIAGNOSIS — E1122 Type 2 diabetes mellitus with diabetic chronic kidney disease: Secondary | ICD-10-CM | POA: Diagnosis not present

## 2022-03-01 DIAGNOSIS — E782 Mixed hyperlipidemia: Secondary | ICD-10-CM | POA: Diagnosis not present

## 2022-03-01 DIAGNOSIS — I480 Paroxysmal atrial fibrillation: Secondary | ICD-10-CM | POA: Diagnosis not present

## 2022-03-01 DIAGNOSIS — E039 Hypothyroidism, unspecified: Secondary | ICD-10-CM | POA: Diagnosis not present

## 2022-03-01 DIAGNOSIS — N183 Chronic kidney disease, stage 3 unspecified: Secondary | ICD-10-CM | POA: Diagnosis not present

## 2022-03-01 DIAGNOSIS — I5020 Unspecified systolic (congestive) heart failure: Secondary | ICD-10-CM | POA: Diagnosis not present

## 2022-03-01 DIAGNOSIS — I10 Essential (primary) hypertension: Secondary | ICD-10-CM | POA: Diagnosis not present

## 2022-03-13 DIAGNOSIS — I480 Paroxysmal atrial fibrillation: Secondary | ICD-10-CM | POA: Diagnosis not present

## 2022-03-13 DIAGNOSIS — G309 Alzheimer's disease, unspecified: Secondary | ICD-10-CM | POA: Diagnosis not present

## 2022-03-13 DIAGNOSIS — Z950 Presence of cardiac pacemaker: Secondary | ICD-10-CM | POA: Diagnosis not present

## 2022-03-13 DIAGNOSIS — Z515 Encounter for palliative care: Secondary | ICD-10-CM | POA: Diagnosis not present

## 2022-03-13 DIAGNOSIS — Z66 Do not resuscitate: Secondary | ICD-10-CM | POA: Diagnosis not present

## 2022-03-13 DIAGNOSIS — F02811 Dementia in other diseases classified elsewhere, unspecified severity, with agitation: Secondary | ICD-10-CM | POA: Diagnosis not present

## 2022-03-28 ENCOUNTER — Other Ambulatory Visit: Payer: Self-pay | Admitting: Cardiology

## 2022-04-03 DIAGNOSIS — E7800 Pure hypercholesterolemia, unspecified: Secondary | ICD-10-CM | POA: Diagnosis not present

## 2022-04-03 DIAGNOSIS — I480 Paroxysmal atrial fibrillation: Secondary | ICD-10-CM | POA: Diagnosis not present

## 2022-04-03 DIAGNOSIS — E1122 Type 2 diabetes mellitus with diabetic chronic kidney disease: Secondary | ICD-10-CM | POA: Diagnosis not present

## 2022-04-03 DIAGNOSIS — E039 Hypothyroidism, unspecified: Secondary | ICD-10-CM | POA: Diagnosis not present

## 2022-04-03 DIAGNOSIS — I10 Essential (primary) hypertension: Secondary | ICD-10-CM | POA: Diagnosis not present

## 2022-05-06 ENCOUNTER — Ambulatory Visit (INDEPENDENT_AMBULATORY_CARE_PROVIDER_SITE_OTHER): Payer: PPO

## 2022-05-06 DIAGNOSIS — I420 Dilated cardiomyopathy: Secondary | ICD-10-CM | POA: Diagnosis not present

## 2022-05-09 LAB — CUP PACEART REMOTE DEVICE CHECK
Battery Impedance: 3433 Ohm
Battery Remaining Longevity: 12 mo
Battery Voltage: 2.69 V
Brady Statistic AP VP Percent: 95 %
Brady Statistic AP VS Percent: 0 %
Brady Statistic AS VP Percent: 5 %
Brady Statistic AS VS Percent: 0 %
Date Time Interrogation Session: 20230820162645
Implantable Lead Implant Date: 20050509
Implantable Lead Implant Date: 20050509
Implantable Lead Location: 753859
Implantable Lead Location: 753860
Implantable Lead Model: 5076
Implantable Lead Model: 5092
Implantable Pulse Generator Implant Date: 20130801
Lead Channel Impedance Value: 433 Ohm
Lead Channel Impedance Value: 580 Ohm
Lead Channel Pacing Threshold Amplitude: 0.5 V
Lead Channel Pacing Threshold Amplitude: 1.125 V
Lead Channel Pacing Threshold Pulse Width: 0.4 ms
Lead Channel Pacing Threshold Pulse Width: 0.4 ms
Lead Channel Setting Pacing Amplitude: 2 V
Lead Channel Setting Pacing Amplitude: 3 V
Lead Channel Setting Pacing Pulse Width: 0.4 ms
Lead Channel Setting Sensing Sensitivity: 4 mV

## 2022-05-14 ENCOUNTER — Ambulatory Visit: Payer: PPO | Admitting: Adult Health

## 2022-05-14 ENCOUNTER — Encounter: Payer: Self-pay | Admitting: Adult Health

## 2022-05-14 VITALS — BP 118/46 | HR 72 | Ht 69.0 in | Wt 143.9 lb

## 2022-05-14 DIAGNOSIS — G301 Alzheimer's disease with late onset: Secondary | ICD-10-CM

## 2022-05-14 DIAGNOSIS — F02818 Dementia in other diseases classified elsewhere, unspecified severity, with other behavioral disturbance: Secondary | ICD-10-CM

## 2022-05-14 NOTE — Progress Notes (Signed)
PATIENT: Edward Suarez DOB: 1931/10/24  REASON FOR VISIT: follow up HISTORY FROM: patient Primary neurologist: Dr. Vickey Huger  Chief Complaint  Patient presents with   Follow-up    Rm 4, wife.  Sweet, gentle nature.  Some incontinence.  Weight lose, even with good diet and eating fair amount of food.       HISTORY OF PRESENT ILLNESS: Today 05/14/22:  [name] is an 88 year old male with a history of memory disturbance.  He returns today for follow-up.  He continues to live at home with his wife.  She helps him with ADLs.  She manages his medications, appointments and finances.  She has noticed some weight loss even with a good diet.  Also has noticed some urinary incontinence.  Change in temperment at times can be verbally abusive at times but other times he is very gentle.  Remains on Aricept and Namenda. Reports they see his PCP tomorrow and plan to discuss palliative care  **Only has 3 pound weight loss since we last saw him in December according to our scales  08/21/21: [name] is an 88 year old male with a history of memory disturbance.  He returns today for follow-up.  He is here with his wife.  She provides most of the information.  They continue to live at friend's home in independent living.  He requires prompting with ADLs.  His wife manages his medications, appointments and finances.  She denies any significant changes in his mood or behavior.  He remains on Aricept and Namenda.  01/10/21: [name] is an 88 year old male with a history of memory disturbance.  He returns today for follow-up.  He remains on Aricept 10 mg at bedtime and Namenda 10 mg twice a day.  His wife does feel that his memory has gotten worse.  He lives at home with his wife.  He is able to complete all ADLs independently but with prompting.  Denies any trouble sleeping.  No change in mood or behavior.  Wife reports that he is sleepy throughout the day.  He does not operate a motor vehicle.  She has  noticed a decrease in his appetite.  He returns today for an evaluation.  07/11/20: [name] is an 88 year old male with a history of memory disturbance. She returns today for follow-up. Currently on Aricept and Namenda. Wife has noticed some progression. She manages his meds/appointments and fiances. He is able to complete all ADLS. Not driving. Sleeps well. Pleasant. No new issues.   HISTORY 01/03/20:   [name] is an 88 year old male with a history of memory disturbance.  He returns today for follow-up.  His wife feels that his memory has gotten slightly worse particularly his short-term memory.  She reports that he is able to complete all ADLs independently.  He continues to help with chores.  He does not operate a motor vehicle.  His wife manages his medications, appointments and all the finances.  She reports that approximately once a month he will wake up at 3 or 4 AM and turn all the lights on and watch television.  Otherwise no significant changes in his mood or behaviors.  REVIEW OF SYSTEMS: Out of a complete 14 system review of symptoms, the patient complains only of the following symptoms, and all other reviewed systems are negative.  See HPI  ALLERGIES: Allergies  Allergen Reactions   Penicillins Rash    Reaction: Childhood   Penicillin G Benzathine Rash   Penicillins Rash  HOME MEDICATIONS: Outpatient Medications Prior to Visit  Medication Sig Dispense Refill   atorvastatin (LIPITOR) 80 MG tablet TAKE ONE TABLET BY MOUTH EVERY EVENING 90 tablet 2   Calcium Carb-Cholecalciferol (CALCIUM 600 + D PO) Take 1 tablet by mouth daily. 10 mcg vit d     cetirizine (ZYRTEC) 10 MG tablet Take 10 mg by mouth daily as needed for allergies. For allergies. Takes prn     dapagliflozin propanediol (FARXIGA) 10 MG TABS tablet Take 1 tablet (10 mg total) by mouth daily. 90 tablet 3   donepezil (ARICEPT) 10 MG tablet TAKE ONE TABLET BY MOUTH EVERY EVENING 90 tablet 0   fluticasone (FLONASE)  50 MCG/ACT nasal spray Place 2 sprays into both nostrils daily as needed for allergies or rhinitis.     isosorbide mononitrate (IMDUR) 30 MG 24 hr tablet TAKE 1/2 TABLET BY MOUTH EVERY MORNING 45 tablet 1   loratadine (CLARITIN) 10 MG tablet Take 10 mg by mouth daily as needed for allergies.     memantine (NAMENDA) 10 MG tablet Take 1 tablet (10 mg total) by mouth 2 (two) times daily. 180 tablet 3   nitroGLYCERIN (NITROSTAT) 0.4 MG SL tablet Place 1 tablet (0.4 mg total) under the tongue every 5 (five) minutes as needed for chest pain. 14 tablet 0   prednisoLONE acetate (PRED FORTE) 1 % ophthalmic suspension Place 1 drop into both eyes as needed.     SYNTHROID 75 MCG tablet Take 75 mcg by mouth daily.     valsartan (DIOVAN) 40 MG tablet Take 1 tablet (40 mg total) by mouth daily. 90 tablet 3   dapagliflozin propanediol (FARXIGA) 10 MG TABS tablet Take 1 tablet (10 mg total) by mouth daily. 14 tablet 0   metoprolol succinate (TOPROL-XL) 25 MG 24 hr tablet Take 12.5 mg by mouth daily. (Patient not taking: Reported on 05/14/2022)     No facility-administered medications prior to visit.    PAST MEDICAL HISTORY: Past Medical History:  Diagnosis Date   Allergic rhinitis, cause unspecified    BPH (benign prostatic hyperplasia)    Carotid artery stenosis, asymptomatic    1-39% by dopplers 09/2016 followed by Dr. Oneida Alar   Cataracts, bilateral    Chronic kidney disease (CKD), stage III (moderate) ([place])    Complete heart block ([place]) 01/23/04   s/p Medtronic PPM implanted by Dr Leonia Reeves   COPD (chronic obstructive pulmonary disease) ([place])    Diabetes mellitus    Glaucoma    Hyperlipidemia    Hypertension    Insomnia due to medical condition 08/10/2014   Kidney stones    PAF (paroxysmal atrial fibrillation) ([place]) 10/09/2014   Noted on pacer check with 88 mode switches and longest episode >1 hour.  Now on Apixiban for Ewing score of 5    PAST SURGICAL HISTORY: Past Surgical History:  Procedure  Laterality Date   PACEMAKER INSERTION  2005, 04/16/12   MDT implanted by Dr Leonia Reeves with generator chnage (MDT Adapta L) by Dr Rayann Heman 04/16/12   PERMANENT PACEMAKER GENERATOR CHANGE N/A 04/16/2012   Procedure: PERMANENT PACEMAKER GENERATOR CHANGE;  Surgeon: Thompson Grayer, MD;  Location: [facility] CATH LAB;  Service: Cardiovascular;  Laterality: N/A;   RIGHT/LEFT HEART CATH AND CORONARY ANGIOGRAPHY N/A 10/18/2020   Procedure: RIGHT/LEFT HEART CATH AND CORONARY ANGIOGRAPHY;  Surgeon: Troy Sine, MD;  Location: [place] CV LAB;  Service: Cardiovascular;  Laterality: N/A;    FAMILY HISTORY: Family History  Problem Relation Age of Onset   Heart disease  Father    Heart attack Father    Alzheimer's disease Sister     SOCIAL HISTORY: Social History   Socioeconomic History   Marital status: Married    Spouse name: [name]   Number of children: 2   Years of education: BS   Highest education level: Not on file  Occupational History   Occupation: retired  Tobacco Use   Smoking status: Former    Packs/day: 1.00    Years: 25.00    Total pack years: 25.00    Types: Cigarettes    Quit date: 09/16/1998    Years since quitting: 23.6   Smokeless tobacco: Never  Vaping Use   Vaping Use: Never used  Substance and Sexual Activity   Alcohol use: Not Currently    Comment: 1-2 a month glasses of wine   Drug use: No   Sexual activity: Not on file  Other Topics Concern   Not on file  Social History Narrative   Retired Mudlogger.  Lives in [place].   Patient is married with 2 children.   Patient is right handed.   Patient has BS degree.   Patient drinks 1 cup daily.         Social Determinants of Health   Financial Resource Strain: Not on file  Food Insecurity: Not on file  Transportation Needs: Not on file  Physical Activity: Not on file  Stress: Not on file  Social Connections: Not on file  Intimate Partner Violence: Not on file      PHYSICAL EXAM  Vitals:   05/14/22 1038   BP: (!) 118/46  Pulse: 72  SpO2: 99%  Weight: 143 lb 14.4 oz (65.3 kg)  Height: 5\' 9"  (1.753 m)    Body mass index is 21.25 kg/m.      08/21/2021    3:58 PM 01/10/2021   11:11 AM 07/11/2020   12:28 PM  MMSE - Mini Mental State Exam  Not completed: Unable to complete Unable to complete   Orientation to time  1 0  Orientation to Place  1 1  Registration  2 3  Attention/ Calculation  0 0  Recall  0 0  Language- name 2 objects  0 2  Language- repeat   0  Language- follow 3 step command   1  Language- read & follow direction   1  Write a sentence   0  Copy design   0  Total score   8     Generalized: Well developed, in no acute distress   Neurological examination  Mentation: Alert oriented to person.  Speech is limited usually answers yes or no Cranial nerve II-XII: Pupils were equal round reactive to light. Extraocular movements were full, visual field were full on confrontational test.  Head turning and shoulder shrug  were normal and symmetric. Motor: The motor testing reveals 5 over 5 strength of all 4 extremities. Good symmetric motor tone is noted throughout.  Sensory: Sensory testing is intact to soft touch on all 4 extremities. No evidence of extinction is noted.  Coordination: Unable to complete Gait and station: Gait is normal.  DIAGNOSTIC DATA (LABS, IMAGING, TESTING) - I reviewed patient records, labs, notes, testing and imaging myself where available.  Lab Results  Component Value Date   WBC 7.2 05/17/2021   HGB 15.6 05/17/2021   HCT 46.0 05/17/2021   MCV 86.9 05/17/2021   PLT 261 05/17/2021      Component Value Date/Time   NA 142  05/17/2021 2213   NA 146 (H) 12/06/2020 1610   K 4.2 05/17/2021 2213   CL 108 05/17/2021 2213   CO2 22 05/17/2021 2201   GLUCOSE 273 (H) 05/17/2021 2213   BUN 31 (H) 05/17/2021 2213   BUN 37 (H) 12/06/2020 1610   CREATININE 1.70 (H) 05/17/2021 2213   CALCIUM 9.0 05/17/2021 2201   PROT 6.0 (L) 05/17/2021 2201   PROT  5.9 (L) 12/16/2016 0822   ALBUMIN 3.6 05/17/2021 2201   ALBUMIN 4.3 12/16/2016 0822   AST 14 (L) 05/17/2021 2201   ALT 15 05/17/2021 2201   ALKPHOS 69 05/17/2021 2201   BILITOT 0.5 05/17/2021 2201   BILITOT 0.3 12/16/2016 0822   GFRNONAA 37 (L) 05/17/2021 2201   GFRAA 43 (L) 10/11/2020 1435   Lab Results  Component Value Date   CHOL 128 12/16/2016   HDL 55 12/16/2016   LDLCALC 58 12/16/2016   TRIG 74 12/16/2016   CHOLHDL 2.3 12/16/2016   Lab Results  Component Value Date   HGBA1C 6.7 (H) 11/16/2020       ASSESSMENT AND PLAN 88 y.o. year old male  has a past medical history of Allergic rhinitis, cause unspecified, BPH (benign prostatic hyperplasia), Carotid artery stenosis, asymptomatic, Cataracts, bilateral, Chronic kidney disease (CKD), stage III (moderate) ([facility]), Complete heart block ([place]) (01/23/04), COPD (chronic obstructive pulmonary disease) ([place]), Diabetes mellitus, Glaucoma, Hyperlipidemia, Hypertension, Insomnia due to medical condition (08/10/2014), Kidney stones, and PAF (paroxysmal atrial fibrillation) ([place]) (10/09/2014). here with:  1.  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 Keep regular FU with PCP     Ward Givens, MSN, NP-C 05/14/2022, 10:44 AM [facility] [address], [place], [place] [address] [phone]

## 2022-05-15 DIAGNOSIS — F0390 Unspecified dementia without behavioral disturbance: Secondary | ICD-10-CM | POA: Diagnosis not present

## 2022-05-15 DIAGNOSIS — I10 Essential (primary) hypertension: Secondary | ICD-10-CM | POA: Diagnosis not present

## 2022-05-23 IMAGING — DX DG CHEST 2V
2 series · 2 of 2 positions shown · non-contrast
Comparison: 03/11/2017

CLINICAL DATA: Acute onset chest pain and nausea beginning this
morning.

EXAM:
CHEST - 2 VIEW

[chest pa]
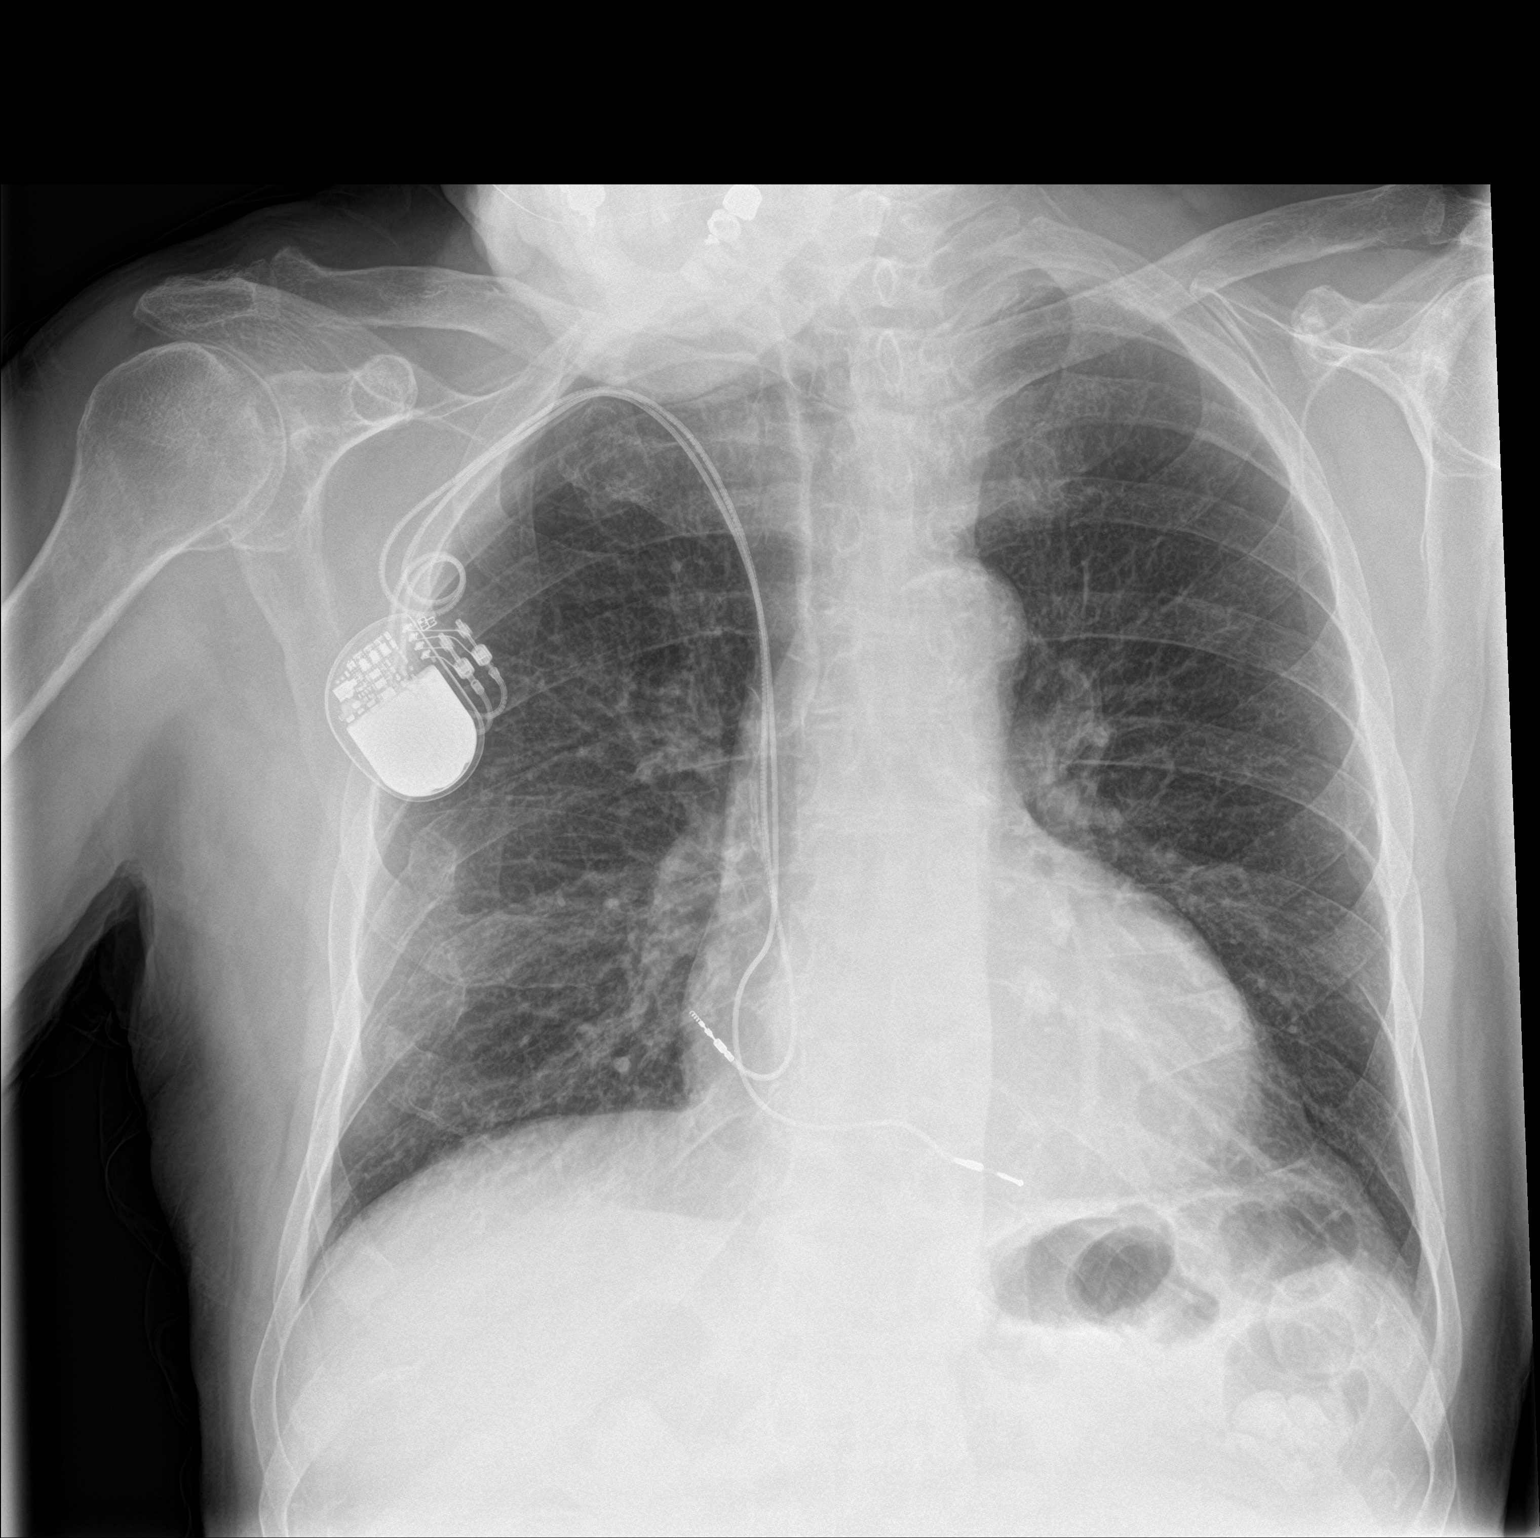

[chest lat]
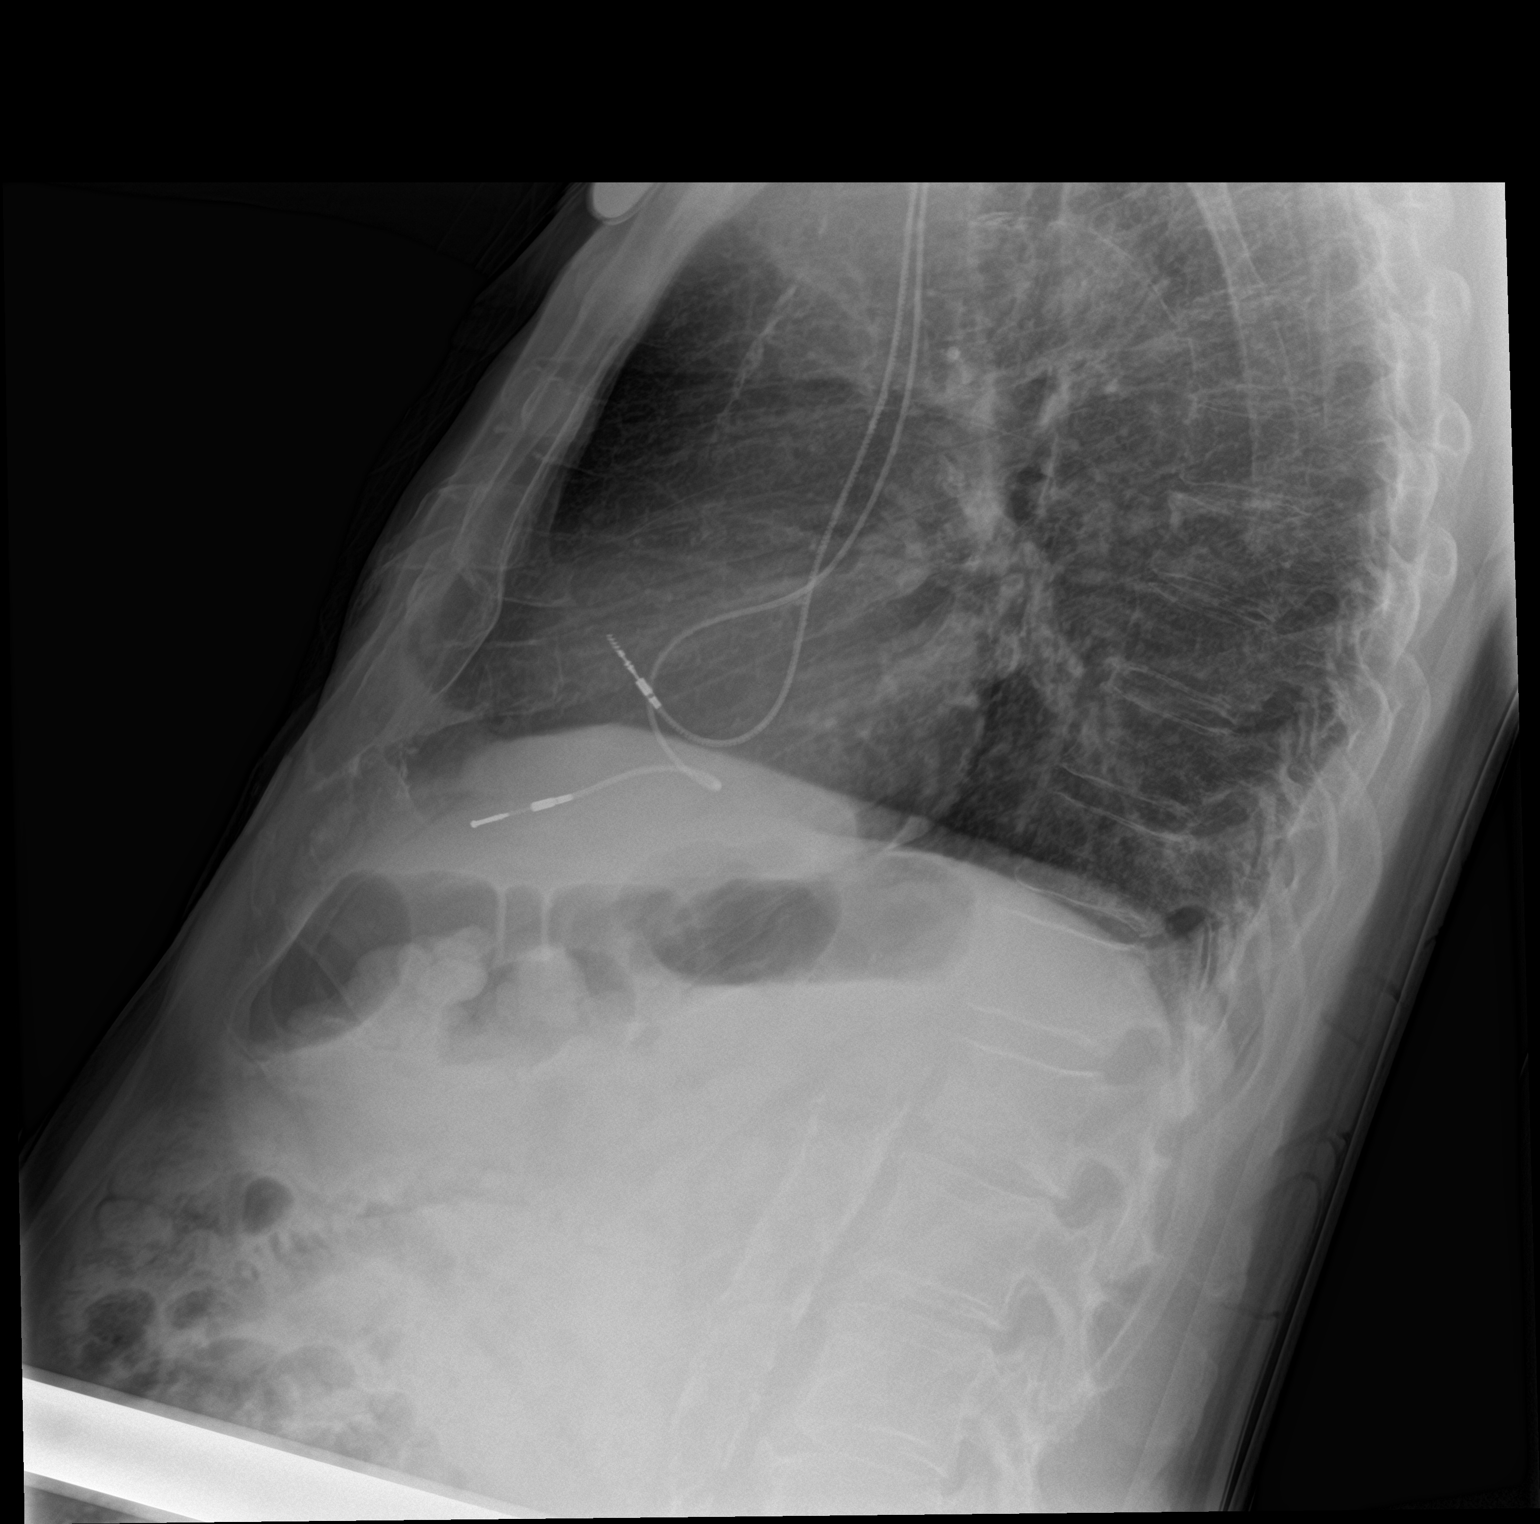

[2 of 2 positions shown; findings below may reference images not displayed]

FINDINGS: The heart size and mediastinal contours are within normal limits.
Dual lead transvenous pacemaker remains in appropriate position.
Aortic atherosclerotic calcification noted. Both lungs are clear.
Multiple old right rib fracture deformities again seen.
IMPRESSION: Stable exam.  No active cardiopulmonary disease.

## 2022-05-30 ENCOUNTER — Other Ambulatory Visit: Payer: Self-pay | Admitting: Adult Health

## 2022-06-02 NOTE — Progress Notes (Signed)
Remote pacemaker transmission.   

## 2022-06-03 DIAGNOSIS — E039 Hypothyroidism, unspecified: Secondary | ICD-10-CM | POA: Diagnosis not present

## 2022-06-03 DIAGNOSIS — I10 Essential (primary) hypertension: Secondary | ICD-10-CM | POA: Diagnosis not present

## 2022-06-03 DIAGNOSIS — E1122 Type 2 diabetes mellitus with diabetic chronic kidney disease: Secondary | ICD-10-CM | POA: Diagnosis not present

## 2022-06-03 DIAGNOSIS — I480 Paroxysmal atrial fibrillation: Secondary | ICD-10-CM | POA: Diagnosis not present

## 2022-06-03 DIAGNOSIS — I5020 Unspecified systolic (congestive) heart failure: Secondary | ICD-10-CM | POA: Diagnosis not present

## 2022-06-03 DIAGNOSIS — N1832 Chronic kidney disease, stage 3b: Secondary | ICD-10-CM | POA: Diagnosis not present

## 2022-06-07 ENCOUNTER — Non-Acute Institutional Stay (SKILLED_NURSING_FACILITY): Payer: PPO | Admitting: Nurse Practitioner

## 2022-06-07 ENCOUNTER — Encounter: Payer: Self-pay | Admitting: Nurse Practitioner

## 2022-06-07 DIAGNOSIS — E1121 Type 2 diabetes mellitus with diabetic nephropathy: Secondary | ICD-10-CM

## 2022-06-07 DIAGNOSIS — I10 Essential (primary) hypertension: Secondary | ICD-10-CM | POA: Diagnosis not present

## 2022-06-07 DIAGNOSIS — E0849 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: Secondary | ICD-10-CM | POA: Diagnosis not present

## 2022-06-07 DIAGNOSIS — F02818 Dementia in other diseases classified elsewhere, unspecified severity, with other behavioral disturbance: Secondary | ICD-10-CM

## 2022-06-07 DIAGNOSIS — G301 Alzheimer's disease with late onset: Secondary | ICD-10-CM

## 2022-06-07 DIAGNOSIS — I480 Paroxysmal atrial fibrillation: Secondary | ICD-10-CM | POA: Diagnosis not present

## 2022-06-07 DIAGNOSIS — E7800 Pure hypercholesterolemia, unspecified: Secondary | ICD-10-CM

## 2022-06-07 DIAGNOSIS — I420 Dilated cardiomyopathy: Secondary | ICD-10-CM

## 2022-06-07 DIAGNOSIS — I5020 Unspecified systolic (congestive) heart failure: Secondary | ICD-10-CM | POA: Diagnosis not present

## 2022-06-07 DIAGNOSIS — E039 Hypothyroidism, unspecified: Secondary | ICD-10-CM

## 2022-06-07 NOTE — Assessment & Plan Note (Signed)
Compensated clinically, takes [place]

## 2022-06-07 NOTE — Assessment & Plan Note (Signed)
Blood pressure is controlledtakes Valsartan, update CMP/eGFR,

## 2022-06-07 NOTE — Assessment & Plan Note (Signed)
taking Donepezil, Memantine, followed by Neurology, obtain MMSE

## 2022-06-07 NOTE — Assessment & Plan Note (Signed)
taking Levothyroxine,  no recent TSH-update TSH

## 2022-06-07 NOTE — Assessment & Plan Note (Signed)
takes Atorvastatin, LDL 58 12/16/16, update lipid panel.

## 2022-06-07 NOTE — Progress Notes (Unsigned)
Location:  [facility] Room Number: NO/110/A Place of Service:  SNF (31) Provider:  Keyen Marban X, NP  Patient Care Team: Seward Carol, MD as PCP - General (Internal Medicine) Sueanne Margarita, MD as PCP - Cardiology (Cardiology) Seward Carol, MD (Internal Medicine)  Extended Emergency Contact Information Primary Emergency Contact: Bissonnette,[name] Address: [phone]          [place], [place] [address] [name] of [place] Phone: [phone] Mobile Phone: [phone] Relation: Spouse Secondary Emergency Contact: [place] Phone: [phone] Mobile Phone: [phone] Relation: Spouse  Code Status:  DNR Goals of care: Advanced Directive information    06/07/2022    8:40 AM  Advanced Directives  Does Patient Have a Medical Advance Directive? Yes  Type of Paramedic of [place];Out of facility DNR (pink MOST or yellow form)  Does patient want to make changes to medical advance directive? No - Patient declined  Copy of [place] in Chart? No - copy requested     Chief Complaint  Patient presents with   Hospitalization Follow-up    Patient is here for follow up after hospital stay And medication review    HPI:  Pt is a 88 y.o. male seen today for medication review.   Afib followed by Cardiology  CAD, on Statin, Isosorbide, prn NTG,   CHFrEF, takes [place]  Alzheimer's dementia, taking Donepezil, Memantine, followed by Neurology  T2DM,diet controlled, no recent Hgb A1c  Hypothyroidism, taking Levothyroxine,  no recent TSH  Hyperlipidemia, takes Atorvastatin, LDL 58 12/16/16  HTN, takes Valsartan  CKD Bun/creat 31/1.7 05/17/21        Past Medical History:  Diagnosis Date   Allergic rhinitis, cause unspecified    BPH (benign prostatic hyperplasia)    Carotid artery stenosis, asymptomatic    1-39% by dopplers 09/2016 followed by Dr. Oneida Alar   Cataracts, bilateral    Chronic kidney  disease (CKD), stage III (moderate) ([place])    Complete heart block ([place]) 01/23/04   s/p Medtronic PPM implanted by Dr Leonia Reeves   COPD (chronic obstructive pulmonary disease) ([place])    Diabetes mellitus    Glaucoma    Hyperlipidemia    Hypertension    Insomnia due to medical condition 08/10/2014   Kidney stones    PAF (paroxysmal atrial fibrillation) ([place]) 10/09/2014   Noted on pacer check with 88 mode switches and longest episode >1 hour.  Now on Apixiban for [place] score of 5   Past Surgical History:  Procedure Laterality Date   PACEMAKER INSERTION  2005, 04/16/12   MDT implanted by Dr Leonia Reeves with generator chnage (MDT Adapta L) by Dr Rayann Heman 04/16/12   PERMANENT PACEMAKER GENERATOR CHANGE N/A 04/16/2012   Procedure: PERMANENT PACEMAKER GENERATOR CHANGE;  Surgeon: Thompson Grayer, MD;  Location: [facility] CATH LAB;  Service: Cardiovascular;  Laterality: N/A;   RIGHT/LEFT HEART CATH AND CORONARY ANGIOGRAPHY N/A 10/18/2020   Procedure: RIGHT/LEFT HEART CATH AND CORONARY ANGIOGRAPHY;  Surgeon: Troy Sine, MD;  Location: [facility] CV LAB;  Service: Cardiovascular;  Laterality: N/A;    Allergies  Allergen Reactions   Penicillins Rash    Reaction: Childhood   Penicillin G Benzathine Rash   Penicillins Rash    Outpatient Encounter Medications as of 06/07/2022  Medication Sig   atorvastatin (LIPITOR) 80 MG tablet TAKE ONE TABLET BY MOUTH EVERY EVENING   Calcium Carb-Cholecalciferol (CALCIUM 600 + D PO) Take 1 tablet by mouth daily. 10 mcg vit d  cetirizine (ZYRTEC) 10 MG tablet Take 10 mg by mouth daily as needed for allergies. For allergies. Takes prn   dapagliflozin propanediol (FARXIGA) 10 MG TABS tablet Take 1 tablet (10 mg total) by mouth daily.   donepezil (ARICEPT) 10 MG tablet TAKE ONE TABLET BY MOUTH EVERY EVENING   fluticasone (FLONASE) 50 MCG/ACT nasal spray Place 2 sprays into both nostrils daily as needed for allergies or rhinitis.   isosorbide mononitrate (IMDUR) 30 MG 24 hr tablet  TAKE 1/2 TABLET BY MOUTH EVERY MORNING   loratadine (CLARITIN) 10 MG tablet Take 10 mg by mouth daily as needed for allergies.   memantine (NAMENDA) 10 MG tablet Take 1 tablet (10 mg total) by mouth 2 (two) times daily.   nitroGLYCERIN (NITROSTAT) 0.4 MG SL tablet Place 1 tablet (0.4 mg total) under the tongue every 5 (five) minutes as needed for chest pain.   prednisoLONE acetate (PRED FORTE) 1 % ophthalmic suspension Place 1 drop into both eyes as needed.   SYNTHROID 75 MCG tablet Take 75 mcg by mouth daily.   valsartan (DIOVAN) 40 MG tablet Take 1 tablet (40 mg total) by mouth daily.   No facility-administered encounter medications on file as of 06/07/2022.    Review of Systems  Constitutional:  Negative for appetite change, fatigue and fever.  HENT:  Negative for congestion and trouble swallowing.   Eyes:  Negative for visual disturbance.  Respiratory:  Negative for cough, shortness of breath and wheezing.   Cardiovascular:  Negative for chest pain, palpitations and leg swelling.  Gastrointestinal:  Negative for abdominal pain, constipation and nausea.  Genitourinary:  Negative for dysuria, frequency and urgency.  Musculoskeletal:  Positive for arthralgias and gait problem.  Skin:  Negative for color change.  Neurological:  Negative for speech difficulty, weakness and headaches.  Psychiatric/Behavioral:  Positive for confusion. Negative for sleep disturbance. The patient is not nervous/anxious.     Immunization History  Administered Date(s) Administered   Influenza-Unspecified 07/04/2014   Zoster Recombinat (Shingrix) 11/06/2017, 03/28/2018   Pertinent  Health Maintenance Due  Topic Date Due   FOOT EXAM  Never done   OPHTHALMOLOGY EXAM  Never done   HEMOGLOBIN A1C  05/19/2021   INFLUENZA VACCINE  04/16/2022      09/03/2020    7:45 PM 09/04/2020   11:00 AM 10/18/2020    8:17 AM 05/17/2021   10:12 PM 06/07/2022    8:41 AM  Fall Risk  Falls in the past year?     0  Was there  an injury with Fall?     0  Fall Risk Category Calculator     0  Fall Risk Category     Low  Patient Fall Risk Level High fall risk High fall risk High fall risk Moderate fall risk Moderate fall risk  Patient at Risk for Falls Due to     History of fall(s)  Fall risk Follow up     Falls evaluation completed   Functional Status Survey:    Vitals:   06/07/22 0840  BP: (!) 155/75  Pulse: 73  Resp: 16  Temp: 97.9 F (36.6 C)  SpO2: 96%  Weight: 143 lb (64.9 kg)  Height: 5' 6"  (1.676 m)   Body mass index is 23.08 kg/m. Physical Exam Vitals and nursing note reviewed.  Constitutional:      Appearance: Normal appearance.  HENT:     Head: Normocephalic and atraumatic.     Nose: Nose normal.     Mouth/Throat:  Mouth: Mucous membranes are moist.  Eyes:     Extraocular Movements: Extraocular movements intact.     Conjunctiva/sclera: Conjunctivae normal.     Pupils: Pupils are equal, round, and reactive to light.  Cardiovascular:     Rate and Rhythm: Normal rate and regular rhythm.     Heart sounds: No murmur heard. Pulmonary:     Effort: Pulmonary effort is normal.     Breath sounds: No rales.  Abdominal:     General: Bowel sounds are normal.     Palpations: Abdomen is soft.     Tenderness: There is no abdominal tenderness.  Musculoskeletal:     Cervical back: Normal range of motion and neck supple.     Right lower leg: No edema.     Left lower leg: No edema.  Skin:    General: Skin is warm and dry.  Neurological:     General: No focal deficit present.     Mental Status: He is alert. Mental status is at baseline.     Gait: Gait abnormal.     Comments: Oriented to person  Psychiatric:        Mood and Affect: Mood normal.        Behavior: Behavior normal.     Labs reviewed: No results for input(s): "NA", "K", "CL", "CO2", "GLUCOSE", "BUN", "CREATININE", "CALCIUM", "MG", "PHOS" in the last 8760 hours. No results for input(s): "AST", "ALT", "ALKPHOS", "BILITOT",  "PROT", "ALBUMIN" in the last 8760 hours. No results for input(s): "WBC", "NEUTROABS", "HGB", "HCT", "MCV", "PLT" in the last 8760 hours. No results found for: "TSH" Lab Results  Component Value Date   HGBA1C 6.7 (H) 11/16/2020   Lab Results  Component Value Date   CHOL 128 12/16/2016   HDL 55 12/16/2016   LDLCALC 58 12/16/2016   TRIG 74 12/16/2016   CHOLHDL 2.3 12/16/2016    Significant Diagnostic Results in last 30 days:  CT HEAD WO CONTRAST (5MM)  Result Date: 05/17/2021 CLINICAL DATA:  Head trauma EXAM: CT HEAD WITHOUT CONTRAST CT CERVICAL SPINE WITHOUT CONTRAST TECHNIQUE: Multidetector CT imaging of the head and cervical spine was performed following the standard protocol without intravenous contrast. Multiplanar CT image reconstructions of the cervical spine were also generated. COMPARISON:  None. FINDINGS: CT HEAD FINDINGS Brain: No evidence of large-territorial acute infarction. No parenchymal hemorrhage. No mass lesion. There is a 1 cm right calvarial convexity hematoma formation with density slightly hypodense to the gray matter. No mass effect or midline shift. No hydrocephalus. Basilar cisterns are patent. Vascular: No hyperdense vessel. Skull: No acute fracture or focal lesion. Sinuses/Orbits: Paranasal sinuses and mastoid air cells are clear. The orbits are unremarkable. Other: None. CT CERVICAL SPINE FINDINGS Alignment: Normal. Skull base and vertebrae: Multilevel severe degenerative changes of the spine. No acute fracture. No aggressive appearing focal osseous lesion or focal pathologic process. Soft tissues and spinal canal: No prevertebral fluid or swelling. No visible canal hematoma. Upper chest: Biapical pleural/pulmonary scarring. Other: Partially visualized cardiac leads on the right. IMPRESSION: 1. Likely subacute 1 cm right subdural hematoma with no associated shift. 2. No acute displaced fracture or traumatic listhesis of the cervical spine. These results were called by  telephone at the time of interpretation on 05/17/2021 at 10:47 pm to provider JULIE HAVILAND , who verbally acknowledged these results. Electronically Signed   By: Iven Finn M.D.   On: 05/17/2021 22:51   CT Cervical Spine Wo Contrast  Result Date: 05/17/2021 CLINICAL DATA:  Head trauma  EXAM: CT HEAD WITHOUT CONTRAST CT CERVICAL SPINE WITHOUT CONTRAST TECHNIQUE: Multidetector CT imaging of the head and cervical spine was performed following the standard protocol without intravenous contrast. Multiplanar CT image reconstructions of the cervical spine were also generated. COMPARISON:  None. FINDINGS: CT HEAD FINDINGS Brain: No evidence of large-territorial acute infarction. No parenchymal hemorrhage. No mass lesion. There is a 1 cm right calvarial convexity hematoma formation with density slightly hypodense to the gray matter. No mass effect or midline shift. No hydrocephalus. Basilar cisterns are patent. Vascular: No hyperdense vessel. Skull: No acute fracture or focal lesion. Sinuses/Orbits: Paranasal sinuses and mastoid air cells are clear. The orbits are unremarkable. Other: None. CT CERVICAL SPINE FINDINGS Alignment: Normal. Skull base and vertebrae: Multilevel severe degenerative changes of the spine. No acute fracture. No aggressive appearing focal osseous lesion or focal pathologic process. Soft tissues and spinal canal: No prevertebral fluid or swelling. No visible canal hematoma. Upper chest: Biapical pleural/pulmonary scarring. Other: Partially visualized cardiac leads on the right. IMPRESSION: 1. Likely subacute 1 cm right subdural hematoma with no associated shift. 2. No acute displaced fracture or traumatic listhesis of the cervical spine. These results were called by telephone at the time of interpretation on 05/17/2021 at 10:47 pm to provider JULIE HAVILAND , who verbally acknowledged these results. Electronically Signed   By: Iven Finn M.D.   On: 05/17/2021 22:51   DG Elbow Complete  Left  Result Date: 05/17/2021 CLINICAL DATA:  Two trauma EXAM: LEFT ELBOW - COMPLETE 3+ VIEW COMPARISON:  X-ray left forearm 05/17/2021 10:16 p.m. FINDINGS: There is no evidence of fracture, dislocation, or joint effusion. There is no evidence of arthropathy or other focal bone abnormality. Soft tissues are unremarkable. IMPRESSION: Negative. Electronically Signed   By: Iven Finn M.D.   On: 05/17/2021 22:37   DG Pelvis Portable  Result Date: 05/17/2021 CLINICAL DATA:  Trauma EXAM: PORTABLE PELVIS 1-2 VIEWS COMPARISON:  None. FINDINGS: Pubic symphysis and rami are intact. The SI joints are non widened. Both femoral heads project in joint. There are mild degenerative changes of the bilateral hips. IMPRESSION: No acute osseous abnormality Electronically Signed   By: Donavan Foil M.D.   On: 05/17/2021 22:31   DG Forearm Left  Result Date: 05/17/2021 CLINICAL DATA:  Fall, level 2 trauma EXAM: LEFT FOREARM - 2 VIEW COMPARISON:  None. FINDINGS: No fracture or dislocation is seen. The joint spaces are preserved. Visualized soft tissues are within normal limits. IMPRESSION: Negative. Electronically Signed   By: Julian Hy M.D.   On: 05/17/2021 22:31   DG Chest Port 1 View  Result Date: 05/17/2021 CLINICAL DATA:  Level 2 trauma. EXAM: PORTABLE CHEST 1 VIEW COMPARISON:  Chest radiograph dated 09/02/2020. FINDINGS: No focal consolidation pleural effusion, pneumothorax. The cardiac silhouette is within limits. Atherosclerotic calcification of the aorta. Right pectoral pacemaker device. No acute osseous pathology. IMPRESSION: No active disease. Electronically Signed   By: Anner Crete M.D.   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   Hypertension Blood pressure is controlledtakes Valsartan, update CMP/eGFR,   Diabetic kidney Bun/creat 31/1.7 05/17/21, update CBC/diff, CMP/eGFR     Family/ staff Communication: plan of care reviewed with the patient and charge nurse  Labs/tests ordered: CBC/diff, CMP/eGFR, lipid panel, Hgb a1c, TSH  Time spend 35 minutes.

## 2022-06-07 NOTE — Assessment & Plan Note (Addendum)
on Statin, Isosorbide, prn NTG

## 2022-06-07 NOTE — Assessment & Plan Note (Signed)
Heart rate is in control, followed by Cardiology

## 2022-06-07 NOTE — Assessment & Plan Note (Signed)
Bun/creat 31/1.7 05/17/21, update CBC/diff, CMP/eGFR

## 2022-06-07 NOTE — Assessment & Plan Note (Signed)
diet controlled, no recent Hgb A1c-update Hgb a1c

## 2022-06-08 ENCOUNTER — Other Ambulatory Visit: Payer: Self-pay | Admitting: Orthopedic Surgery

## 2022-06-08 DIAGNOSIS — F03911 Unspecified dementia, unspecified severity, with agitation: Secondary | ICD-10-CM

## 2022-06-08 MED ORDER — LORAZEPAM 0.5 MG PO TABS: 0.5000 mg | ORAL_TABLET | Freq: Three times a day (TID) | ORAL | 0 refills | Status: DC | PRN

## 2022-06-08 MED ORDER — DIVALPROEX SODIUM 125 MG PO DR TAB: 125.0000 mg | DELAYED_RELEASE_TABLET | Freq: Two times a day (BID) | ORAL | 0 refills | Status: DC

## 2022-06-08 NOTE — Progress Notes (Signed)
[facility] nurse reports increased agitation (swinging at staff) and exit seeking behavior. H/o dementia. Recently transferred to SNF. He is on Aricept and Namenda. Will start depakote and ativan prn. SBAR completed for provider to complete 09/25.

## 2022-06-10 ENCOUNTER — Encounter: Payer: Self-pay | Admitting: Nurse Practitioner

## 2022-06-10 ENCOUNTER — Non-Acute Institutional Stay (SKILLED_NURSING_FACILITY): Payer: PPO | Admitting: Nurse Practitioner

## 2022-06-10 DIAGNOSIS — I480 Paroxysmal atrial fibrillation: Secondary | ICD-10-CM

## 2022-06-10 DIAGNOSIS — E0849 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: Secondary | ICD-10-CM

## 2022-06-10 DIAGNOSIS — I10 Essential (primary) hypertension: Secondary | ICD-10-CM

## 2022-06-10 DIAGNOSIS — I420 Dilated cardiomyopathy: Secondary | ICD-10-CM

## 2022-06-10 DIAGNOSIS — G301 Alzheimer's disease with late onset: Secondary | ICD-10-CM | POA: Diagnosis not present

## 2022-06-10 DIAGNOSIS — F02818 Dementia in other diseases classified elsewhere, unspecified severity, with other behavioral disturbance: Secondary | ICD-10-CM | POA: Diagnosis not present

## 2022-06-10 DIAGNOSIS — I5020 Unspecified systolic (congestive) heart failure: Secondary | ICD-10-CM | POA: Diagnosis not present

## 2022-06-10 DIAGNOSIS — E1121 Type 2 diabetes mellitus with diabetic nephropathy: Secondary | ICD-10-CM

## 2022-06-10 DIAGNOSIS — E039 Hypothyroidism, unspecified: Secondary | ICD-10-CM

## 2022-06-10 NOTE — Progress Notes (Unsigned)
Location:   [facility] Room Number: 110 Place of Service:  SNF (31) Provider: Lennie Odor Myrtle Barnhard NP  Seward Carol, MD  Patient Care Team: Seward Carol, MD as PCP - General (Internal Medicine) Sueanne Margarita, MD as PCP - Cardiology (Cardiology) Seward Carol, MD (Internal Medicine)  Extended Emergency Contact Information Primary Emergency Contact: Morris,[name] Address: [phone]          [place], [place] [address] [name] of [place] Phone: [phone] Mobile Phone: [phone] Relation: Spouse Secondary Emergency Contact: [place] Phone: [phone] Mobile Phone: [phone] Relation: Spouse  Code Status: DNR Goals of care: Advanced Directive information    06/10/2022    2:45 PM  Advanced Directives  Does Patient Have a Medical Advance Directive? No     Chief Complaint  Patient presents with   Acute Visit    Combative behaviors.     HPI:  Pt is a 88 y.o. male seen today for an acute visit for combative behaviors, swinging out/hitting staff, threw BP machine across the room, yelled get out or you are about to die when incontinent care was provided 06/08/22. MMSE 2/30 06/07/22. Prn Lorazepam tid and Depakote 181m bid started 06/08/22. Pending CBC/diff, CMP/eGFR, lipid panel, Hgb a1c, TSH     Afib followed by Cardiology, heart rate is in control.              CAD, on Statin, Isosorbide, prn NTG,              CHFrEF, takes FIran            Alzheimer's dementia, taking Donepezil, Memantine, followed by Neurology             T2DM,diet controlled, no recent Hgb A1c             Hypothyroidism, taking Levothyroxine,  no recent TSH             Hyperlipidemia, takes Atorvastatin, LDL 58 12/16/16             HTN, takes Valsartan             CKD Bun/creat 31/1.7 05/17/21  Past Medical History:  Diagnosis Date   Allergic rhinitis, cause unspecified    BPH (benign prostatic hyperplasia)    Carotid artery stenosis, asymptomatic    1-39% by  dopplers 09/2016 followed by Dr. FOneida Alar  Cataracts, bilateral    Chronic kidney disease (CKD), stage III (moderate) ([place]    Complete heart block ([place] 01/23/04   s/p Medtronic PPM implanted by Dr ELeonia Reeves  COPD (chronic obstructive pulmonary disease) ([place]    Diabetes mellitus    Glaucoma    Hyperlipidemia    Hypertension    Insomnia due to medical condition 08/10/2014   Kidney stones    PAF (paroxysmal atrial fibrillation) ([place] 10/09/2014   Noted on pacer check with 88 mode switches and longest episode >1 hour.  Now on Apixiban for [place] 5   Past Surgical History:  Procedure Laterality Date   PACEMAKER INSERTION  2005, 04/16/12   MDT implanted by Dr ELeonia Reeveswith generator chnage (MDT Adapta L) by Dr ARayann Heman8/1/13   PERMANENT PACEMAKER GENERATOR CHANGE N/A 04/16/2012   Procedure: PERMANENT PACEMAKER GENERATOR CHANGE;  Surgeon: JThompson Grayer MD;  [facility];  Service: Cardiovascular;  Laterality: N/A;   RIGHT/LEFT HEART CATH AND CORONARY ANGIOGRAPHY N/A 10/18/2020   Procedure: RIGHT/LEFT HEART CATH AND CORONARY ANGIOGRAPHY;  Surgeon: KTroy Sine  MD;  Location: [facility];  Service: Cardiovascular;  Laterality: N/A;    Allergies  Allergen Reactions   Penicillins Rash    Reaction: Childhood   Penicillin G Benzathine Rash   Penicillins Rash    Allergies as of 06/10/2022       Reactions   Penicillins Rash   Reaction: Childhood   Penicillin G Benzathine Rash   Penicillins Rash        Medication List        Accurate as of June 10, 2022 11:59 PM. If you have any questions, ask your nurse or doctor.          atorvastatin 80 MG tablet Commonly known as: LIPITOR TAKE ONE TABLET BY MOUTH EVERY EVENING   CALCIUM 600 + D PO Take 1 tablet by mouth daily. 10 mcg vit d   cetirizine 10 MG tablet Commonly known as: ZYRTEC Take 10 mg by mouth daily as needed for allergies. For allergies. Takes prn   dapagliflozin propanediol 10 MG Tabs  tablet Commonly known as: FARXIGA Take 1 tablet (10 mg total) by mouth daily.   divalproex 125 MG DR tablet Commonly known as: Depakote Take 1 tablet (125 mg total) by mouth 2 (two) times daily.   donepezil 10 MG tablet Commonly known as: ARICEPT TAKE ONE TABLET BY MOUTH EVERY EVENING   fluticasone 50 MCG/ACT nasal spray Commonly known as: FLONASE Place 2 sprays into both nostrils daily as needed for allergies or rhinitis.   isosorbide mononitrate 30 MG 24 hr tablet Commonly known as: IMDUR TAKE 1/2 TABLET BY MOUTH EVERY MORNING   loratadine 10 MG tablet Commonly known as: CLARITIN Take 10 mg by mouth daily as needed for allergies.   LORazepam 0.5 MG tablet Commonly known as: Ativan Take 1 tablet (0.5 mg total) by mouth 3 (three) times daily as needed for anxiety. agitation   memantine 10 MG tablet Commonly known as: Namenda Take 1 tablet (10 mg total) by mouth 2 (two) times daily.   nitroGLYCERIN 0.4 MG SL tablet Commonly known as: NITROSTAT Place 1 tablet (0.4 mg total) under the tongue every 5 (five) minutes as needed for chest pain.   prednisoLONE acetate 1 % ophthalmic suspension Commonly known as: PRED FORTE Place 1 drop into both eyes as needed.   Synthroid 75 MCG tablet Generic drug: levothyroxine Take 75 mcg by mouth daily.   valsartan 40 MG tablet Commonly known as: Diovan Take 1 tablet (40 mg total) by mouth daily.       ROS was provided with assistance of staff.  Review of Systems  Constitutional:  Negative for appetite change, fatigue and fever.  HENT:  Negative for congestion and trouble swallowing.   Eyes:  Negative for visual disturbance.  Respiratory:  Negative for cough, shortness of breath and wheezing.   Cardiovascular:  Negative for chest pain, palpitations and leg swelling.  Gastrointestinal:  Negative for abdominal pain, constipation and nausea.  Genitourinary:  Negative for dysuria, frequency and urgency.  Musculoskeletal:  Positive  for arthralgias and gait problem.  Skin:  Negative for color change.  Neurological:  Negative for speech difficulty, weakness and headaches.  Psychiatric/Behavioral:  Positive for agitation, behavioral problems and confusion. Negative for sleep disturbance. The patient is not nervous/anxious.     Immunization History  Administered Date(s) Administered   Influenza-Unspecified 07/04/2014   Moderna SARS-COV2 Booster Vaccination 07/25/2020, 02/13/2021   Moderna Sars-Covid-2 Vaccination 09/20/2019, 10/18/2019   Pfizer Covid-19 Vaccine Bivalent Booster 18yr & up  06/20/2021   Zoster Recombinat (Shingrix) 11/06/2017, 03/28/2018   Pertinent  Health Maintenance Due  Topic Date Due   FOOT EXAM  Never done   OPHTHALMOLOGY EXAM  Never done   HEMOGLOBIN A1C  05/19/2021   INFLUENZA VACCINE  04/16/2022      09/03/2020    7:45 PM 09/04/2020   11:00 AM 10/18/2020    8:17 AM 05/17/2021   10:12 PM 06/07/2022    8:41 AM  Fall Risk  Falls in the past year?     0  Was there an injury with Fall?     0  Fall Risk Category Calculator     0  Fall Risk Category     Low  Patient Fall Risk Level High fall risk High fall risk High fall risk Moderate fall risk Moderate fall risk  Patient at Risk for Falls Due to     History of fall(s)  Fall risk Follow up     Falls evaluation completed   Functional Status Survey:    Vitals:   06/10/22 1107  BP: (!) 142/78  Pulse: 68  Resp: 16  Temp: 97.6 F (36.4 C)  SpO2: 97%  Height: 5' 6"  (1.676 m)   Body mass index is 23.08 kg/m. Physical Exam Vitals and nursing note reviewed.  Constitutional:      Appearance: Normal appearance.  HENT:     Head: Normocephalic and atraumatic.     Nose: Nose normal.     Mouth/Throat:     Mouth: Mucous membranes are moist.  Eyes:     Extraocular Movements: Extraocular movements intact.     Conjunctiva/sclera: Conjunctivae normal.     Pupils: Pupils are equal, round, and reactive to light.  Cardiovascular:     Rate and  Rhythm: Normal rate and regular rhythm.     Heart sounds: No murmur heard. Pulmonary:     Effort: Pulmonary effort is normal.     Breath sounds: No rales.  Abdominal:     General: Bowel sounds are normal.     Palpations: Abdomen is soft.     Tenderness: There is no abdominal tenderness.  Musculoskeletal:     Cervical back: Normal range of motion and neck supple.     Right lower leg: No edema.     Left lower leg: No edema.  Skin:    General: Skin is warm and dry.  Neurological:     General: No focal deficit present.     Mental Status: He is alert. Mental status is at baseline.     Gait: Gait abnormal.     Comments: Oriented to person  Psychiatric:     Comments: Flat affect, non cooperative.      Labs reviewed: No results for input(s): "NA", "K", "CL", "CO2", "GLUCOSE", "BUN", "CREATININE", "CALCIUM", "MG", "PHOS" in the last 8760 hours. No results for input(s): "AST", "ALT", "ALKPHOS", "BILITOT", "PROT", "ALBUMIN" in the last 8760 hours. No results for input(s): "WBC", "NEUTROABS", "HGB", "HCT", "MCV", "PLT" in the last 8760 hours. No results found for: "TSH" Lab Results  Component Value Date   HGBA1C 6.7 (H) 11/16/2020   Lab Results  Component Value Date   CHOL 128 12/16/2016   HDL 55 12/16/2016   LDLCALC 58 12/16/2016   TRIG 74 12/16/2016   CHOLHDL 2.3 12/16/2016    Significant Diagnostic Results in last 30 days:  No results found.  Assessment/Plan: Late onset Alzheimer's dementia with behavioral disturbance ([place]) combative behaviors, swinging out/hitting staff, threw BP machine across  the room, yelled get out or you are about to die when incontinent care was provided 06/08/22. MMSE 2/30 06/07/22. Prn Lorazepam tid and Depakote 137m bid started 06/08/22. Pending CBC/diff, CMP/eGFR, lipid panel, Hgb a1c, TSH taking Donepezil, Memantine, followed by Neurology  PAF (paroxysmal atrial fibrillation) ([place]  Afib followed by Cardiology, heart rate is in control.   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   Labs/tests ordered:  CMP 2 weeks  Time spend 35 minutes

## 2022-06-10 NOTE — Assessment & Plan Note (Signed)
combative behaviors, swinging out/hitting staff, threw BP machine across the room, yelled get out or you are about to die when incontinent care was provided 06/08/22. MMSE 2/30 06/07/22. Prn Lorazepam tid and Depakote 128m bid started 06/08/22. Pending CBC/diff, CMP/eGFR, lipid panel, Hgb a1c, TSH taking Donepezil, Memantine, followed by Neurology

## 2022-06-10 NOTE — Assessment & Plan Note (Signed)
Afib followed by Cardiology, heart rate is in control.

## 2022-06-10 NOTE — Assessment & Plan Note (Signed)
takes [place]

## 2022-06-10 NOTE — Assessment & Plan Note (Signed)
Blood pressure is controlled, takes Valsartan

## 2022-06-10 NOTE — Assessment & Plan Note (Signed)
Bun/creat 31/1.7 05/17/21

## 2022-06-10 NOTE — Assessment & Plan Note (Signed)
diet controlled, no recent Hgb A1c

## 2022-06-10 NOTE — Assessment & Plan Note (Signed)
taking Levothyroxine,  no recent TSH

## 2022-06-10 NOTE — Assessment & Plan Note (Signed)
on Statin, Isosorbide, prn NTG,  

## 2022-06-11 ENCOUNTER — Non-Acute Institutional Stay (SKILLED_NURSING_FACILITY): Payer: PPO | Admitting: Family Medicine

## 2022-06-11 ENCOUNTER — Encounter: Payer: Self-pay | Admitting: Nurse Practitioner

## 2022-06-11 DIAGNOSIS — E1121 Type 2 diabetes mellitus with diabetic nephropathy: Secondary | ICD-10-CM | POA: Diagnosis not present

## 2022-06-11 DIAGNOSIS — F02818 Dementia in other diseases classified elsewhere, unspecified severity, with other behavioral disturbance: Secondary | ICD-10-CM

## 2022-06-11 DIAGNOSIS — N1831 Chronic kidney disease, stage 3a: Secondary | ICD-10-CM

## 2022-06-11 DIAGNOSIS — I10 Essential (primary) hypertension: Secondary | ICD-10-CM | POA: Diagnosis not present

## 2022-06-11 DIAGNOSIS — G301 Alzheimer's disease with late onset: Secondary | ICD-10-CM | POA: Diagnosis not present

## 2022-06-11 DIAGNOSIS — R1313 Dysphagia, pharyngeal phase: Secondary | ICD-10-CM | POA: Diagnosis not present

## 2022-06-11 DIAGNOSIS — M6281 Muscle weakness (generalized): Secondary | ICD-10-CM | POA: Diagnosis not present

## 2022-06-11 DIAGNOSIS — M25552 Pain in left hip: Secondary | ICD-10-CM | POA: Diagnosis not present

## 2022-06-11 DIAGNOSIS — I480 Paroxysmal atrial fibrillation: Secondary | ICD-10-CM | POA: Diagnosis not present

## 2022-06-11 DIAGNOSIS — R2681 Unsteadiness on feet: Secondary | ICD-10-CM | POA: Diagnosis not present

## 2022-06-11 DIAGNOSIS — E0849 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: Secondary | ICD-10-CM | POA: Diagnosis not present

## 2022-06-11 DIAGNOSIS — R2689 Other abnormalities of gait and mobility: Secondary | ICD-10-CM | POA: Diagnosis not present

## 2022-06-11 DIAGNOSIS — R41841 Cognitive communication deficit: Secondary | ICD-10-CM | POA: Diagnosis not present

## 2022-06-11 DIAGNOSIS — R29898 Other symptoms and signs involving the musculoskeletal system: Secondary | ICD-10-CM | POA: Diagnosis not present

## 2022-06-11 DIAGNOSIS — F0390 Unspecified dementia without behavioral disturbance: Secondary | ICD-10-CM | POA: Diagnosis not present

## 2022-06-11 DIAGNOSIS — M79642 Pain in left hand: Secondary | ICD-10-CM | POA: Diagnosis not present

## 2022-06-11 DIAGNOSIS — R1310 Dysphagia, unspecified: Secondary | ICD-10-CM | POA: Diagnosis not present

## 2022-06-11 NOTE — Progress Notes (Signed)
This encounter was created in error - please disregard.

## 2022-06-11 NOTE — Progress Notes (Unsigned)
Location:  [facility]    Place of Service: [facility]   Provider: Man X Mast NP   Seward Carol, MD  Patient Care Team: Seward Carol, MD as PCP - General (Internal Medicine) Sueanne Margarita, MD as PCP - Cardiology (Cardiology) Seward Carol, MD (Internal Medicine)  Extended Emergency Contact Information Primary Emergency Contact: Kallio,[name] Address: [address]          [place], [place] [address] [name] of [place] Phone: [phone] Mobile Phone: [phone] Relation: Spouse Secondary Emergency Contact: [place] Phone: [phone] Mobile Phone: [phone] Relation: Spouse  Code Status:DNR   Managed Care   Goals of care: Advanced Directive information    06/10/2022    2:45 PM  Advanced Directives  Does Patient Have a Medical Advance Directive? No     No chief complaint on file.   HPI:  Pt is a 88 y.o. male seen today for medical management of chronic diseases.     Past Medical History:  Diagnosis Date   Allergic rhinitis, cause unspecified    BPH (benign prostatic hyperplasia)    Carotid artery stenosis, asymptomatic    1-39% by dopplers 09/2016 followed by Dr. Oneida Alar   Cataracts, bilateral    Chronic kidney disease (CKD), stage III (moderate) ([place])    Complete heart block ([place]) 01/23/04   s/p Medtronic PPM implanted by Dr Leonia Reeves   COPD (chronic obstructive pulmonary disease) ([place])    Diabetes mellitus    Glaucoma    Hyperlipidemia    Hypertension    Insomnia due to medical condition 08/10/2014   Kidney stones    PAF (paroxysmal atrial fibrillation) ([place]) 10/09/2014   Noted on pacer check with 88 mode switches and longest episode >1 hour.  Now on Apixiban for [place] score of 5   Past Surgical History:  Procedure Laterality Date   PACEMAKER INSERTION  2005, 04/16/12   MDT implanted by Dr Leonia Reeves with generator chnage (MDT Adapta L) by Dr Rayann Heman 04/16/12   PERMANENT PACEMAKER GENERATOR  CHANGE N/A 04/16/2012   Procedure: PERMANENT PACEMAKER GENERATOR CHANGE;  Surgeon: Thompson Grayer, MD;  Location: [facility] CATH LAB;  Service: Cardiovascular;  Laterality: N/A;   RIGHT/LEFT HEART CATH AND CORONARY ANGIOGRAPHY N/A 10/18/2020   Procedure: RIGHT/LEFT HEART CATH AND CORONARY ANGIOGRAPHY;  Surgeon: Troy Sine, MD;  Location: [facility] CV LAB;  Service: Cardiovascular;  Laterality: N/A;    Allergies  Allergen Reactions   Penicillins Rash    Reaction: Childhood   Penicillin G Benzathine Rash   Penicillins Rash    Allergies as of 06/10/2022       Reactions   Penicillins Rash   Reaction: Childhood   Penicillin G Benzathine Rash   Penicillins Rash        Medication List        Accurate as of June 10, 2022 11:59 PM. If you have any questions, ask your nurse or doctor.          atorvastatin 80 MG tablet Commonly known as: LIPITOR TAKE ONE TABLET BY MOUTH EVERY EVENING   CALCIUM 600 + D PO Take 1 tablet by mouth daily. 10 mcg vit d   cetirizine 10 MG tablet Commonly known as: ZYRTEC Take 10 mg by mouth daily as needed for allergies. For allergies. Takes prn   dapagliflozin propanediol 10 MG Tabs tablet Commonly known as: FARXIGA Take 1 tablet (10 mg total) by mouth daily.   divalproex 125 MG DR tablet  Commonly known as: Depakote Take 1 tablet (125 mg total) by mouth 2 (two) times daily.   donepezil 10 MG tablet Commonly known as: ARICEPT TAKE ONE TABLET BY MOUTH EVERY EVENING   fluticasone 50 MCG/ACT nasal spray Commonly known as: FLONASE Place 2 sprays into both nostrils daily as needed for allergies or rhinitis.   isosorbide mononitrate 30 MG 24 hr tablet Commonly known as: IMDUR TAKE 1/2 TABLET BY MOUTH EVERY MORNING   loratadine 10 MG tablet Commonly known as: CLARITIN Take 10 mg by mouth daily as needed for allergies.   LORazepam 0.5 MG tablet Commonly known as: Ativan Take 1 tablet (0.5 mg total) by mouth 3 (three) times daily as needed  for anxiety. agitation   memantine 10 MG tablet Commonly known as: Namenda Take 1 tablet (10 mg total) by mouth 2 (two) times daily.   nitroGLYCERIN 0.4 MG SL tablet Commonly known as: NITROSTAT Place 1 tablet (0.4 mg total) under the tongue every 5 (five) minutes as needed for chest pain.   prednisoLONE acetate 1 % ophthalmic suspension Commonly known as: PRED FORTE Place 1 drop into both eyes as needed.   Synthroid 75 MCG tablet Generic drug: levothyroxine Take 75 mcg by mouth daily.   valsartan 40 MG tablet Commonly known as: Diovan Take 1 tablet (40 mg total) by mouth daily.        Review of Systems  Immunization History  Administered Date(s) Administered   Influenza-Unspecified 07/04/2014   Moderna SARS-COV2 Booster Vaccination 07/25/2020, 02/13/2021   Moderna Sars-Covid-2 Vaccination 09/20/2019, 10/18/2019   Pfizer Covid-19 Vaccine Bivalent Booster 35yrs & up 06/20/2021   Zoster Recombinat (Shingrix) 11/06/2017, 03/28/2018   Pertinent  Health Maintenance Due  Topic Date Due   FOOT EXAM  Never done   OPHTHALMOLOGY EXAM  Never done   HEMOGLOBIN A1C  05/19/2021   INFLUENZA VACCINE  04/16/2022      09/03/2020    7:45 PM 09/04/2020   11:00 AM 10/18/2020    8:17 AM 05/17/2021   10:12 PM 06/07/2022    8:41 AM  Fall Risk  Falls in the past year?     0  Was there an injury with Fall?     0  Fall Risk Category Calculator     0  Fall Risk Category     Low  Patient Fall Risk Level High fall risk High fall risk High fall risk Moderate fall risk Moderate fall risk  Patient at Risk for Falls Due to     History of fall(s)  Fall risk Follow up     Falls evaluation completed   Functional Status Survey:    There were no vitals filed for this visit. There is no height or weight on file to calculate BMI. Physical Exam  Labs reviewed: No results for input(s): "NA", "K", "CL", "CO2", "GLUCOSE", "BUN", "CREATININE", "CALCIUM", "MG", "PHOS" in the last 8760 hours. No  results for input(s): "AST", "ALT", "ALKPHOS", "BILITOT", "PROT", "ALBUMIN" in the last 8760 hours. No results for input(s): "WBC", "NEUTROABS", "HGB", "HCT", "MCV", "PLT" in the last 8760 hours. No results found for: "TSH" Lab Results  Component Value Date   HGBA1C 6.7 (H) 11/16/2020   Lab Results  Component Value Date   CHOL 128 12/16/2016   HDL 55 12/16/2016   LDLCALC 58 12/16/2016   TRIG 74 12/16/2016   CHOLHDL 2.3 12/16/2016    Significant Diagnostic Results in last 30 days:  No results found.  Assessment/Plan There are no diagnoses linked  to this encounter.   Family/ staff Communication:   Labs/tests ordered:

## 2022-06-11 NOTE — Progress Notes (Unsigned)
Provider:  Alain Honey, MD Location:      Place of Service:     PCP: Seward Carol, MD Patient Care Team: Seward Carol, MD as PCP - General (Internal Medicine) Sueanne Margarita, MD as PCP - Cardiology (Cardiology) Seward Carol, MD (Internal Medicine)  Extended Emergency Contact Information Primary Emergency Contact: [facility] [address] Address: [address]          [place], [place] [address] [name] of [place] Phone: [phone] Mobile Phone: [phone] Relation: Spouse Secondary Emergency Contact: [place] Phone: [phone] Mobile Phone: [phone] Relation: Spouse  Code Status:  Goals of Care: Advanced Directive information    06/10/2022    2:45 PM  Advanced Directives  Does Patient Have a Medical Advance Directive? No      No chief complaint on file.   HPI: Patient is a 88 y.o. male seen today for admission to [facility].  He has a history of dementia a and wife feels like she can no longer care for him in the home given his increasing needs.  He continues on Aricept and Namenda.  , requires more help with ADLs.  There have been some behaviors in recent months he has been sleeping more, is more confused nd has been followed by neurology for some time.  Prior to his admission he lived with his wife in independent living here at friend's home.  Chronic problems include chronic kidney disease stage IIIb, complete heart block with pacemaker, COPD, hyperlipidemia, hypertension, paroxysmal atrial fibrillation on apixaban.  Yesterday patient had problems with agitation requiring IM lorazepam.  Depakote was added.  During his agitation I believe he may have hit his left hand on a person with a wall.  There is some bruising there today and some tenderness but there is no deformity and he has good strength.  I think this is probably bruised and not broken. I have scheduled Ativan 3 times daily for 1 week while the Depakote is  building.  Past Medical History:  Diagnosis Date   Allergic rhinitis, cause unspecified    BPH (benign prostatic hyperplasia)    Carotid artery stenosis, asymptomatic    1-39% by dopplers 09/2016 followed by Dr. Oneida Alar   Cataracts, bilateral    Chronic kidney disease (CKD), stage III (moderate) ([place])    Complete heart block ([place]) 01/23/04   s/p Medtronic PPM implanted by Dr Leonia Reeves   COPD (chronic obstructive pulmonary disease) ([place])    Diabetes mellitus    Glaucoma    Hyperlipidemia    Hypertension    Insomnia due to medical condition 08/10/2014   Kidney stones    PAF (paroxysmal atrial fibrillation) ([place]) 10/09/2014   Noted on pacer check with 88 mode switches and longest episode >1 hour.  Now on Apixiban for [place] score of 5   Past Surgical History:  Procedure Laterality Date   PACEMAKER INSERTION  2005, 04/16/12   MDT implanted by Dr Leonia Reeves with generator chnage (MDT Adapta L) by Dr Rayann Heman 04/16/12   PERMANENT PACEMAKER GENERATOR CHANGE N/A 04/16/2012   Procedure: PERMANENT PACEMAKER GENERATOR CHANGE;  Surgeon: Thompson Grayer, MD;  Location: [facility] CATH LAB;  Service: Cardiovascular;  Laterality: N/A;   RIGHT/LEFT HEART CATH AND CORONARY ANGIOGRAPHY N/A 10/18/2020   Procedure: RIGHT/LEFT HEART CATH AND CORONARY ANGIOGRAPHY;  Surgeon: Troy Sine, MD;  Location: [facility] CV LAB;  Service: Cardiovascular;  Laterality: N/A;    reports that he quit smoking about 23 years ago. His smoking use included  cigarettes. He has a 25.00 pack-year smoking history. He has never used smokeless tobacco. He reports that he does not currently use alcohol. He reports that he does not use drugs. Social History   Socioeconomic History   Marital status: Married    Spouse name: [name]   Number of children: 2   Years of education: BS   Highest education level: Not on file  Occupational History   Occupation: retired  Tobacco Use   Smoking status: Former    Packs/day: 1.00    Years: 25.00    Total  pack years: 25.00    Types: Cigarettes    Quit date: 09/16/1998    Years since quitting: 23.7   Smokeless tobacco: Never  Vaping Use   Vaping Use: Never used  Substance and Sexual Activity   Alcohol use: Not Currently    Comment: 1-2 a month glasses of wine   Drug use: No   Sexual activity: Not on file  Other Topics Concern   Not on file  Social History Narrative   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   Patient is married with 2 children.   Patient is right handed.   Patient has BS degree.   Patient drinks 1 cup daily.         Social Determinants of Health   Financial Resource Strain: Not on file  Food Insecurity: Not on file  Transportation Needs: Not on file  Physical Activity: Not on file  Stress: Not on file  Social Connections: Not on file  Intimate Partner Violence: Not on file    Functional Status Survey:    Family History  Problem Relation Age of Onset   Heart disease Father    Heart attack Father    Alzheimer's disease Sister     Health Maintenance  Topic Date Due   Pneumonia Vaccine 43+ Years old (1 - PCV) Never done   FOOT EXAM  Never done   OPHTHALMOLOGY EXAM  Never done   TETANUS/TDAP  Never done   HEMOGLOBIN A1C  05/19/2021   COVID-19 Vaccine (4 - Mixed Product risk series) 08/15/2021   INFLUENZA VACCINE  04/16/2022   Zoster Vaccines- Shingrix  Completed   HPV VACCINES  Aged Out    Allergies  Allergen Reactions   Penicillins Rash    Reaction: Childhood   Penicillin G Benzathine Rash   Penicillins Rash    Outpatient Encounter Medications as of 06/11/2022  Medication Sig   atorvastatin (LIPITOR) 80 MG tablet TAKE ONE TABLET BY MOUTH EVERY EVENING   Calcium Carb-Cholecalciferol (CALCIUM 600 + D PO) Take 1 tablet by mouth daily. 10 mcg vit d   cetirizine (ZYRTEC) 10 MG tablet Take 10 mg by mouth daily as needed for allergies. For allergies. Takes prn   dapagliflozin propanediol (FARXIGA) 10 MG TABS tablet Take 1 tablet (10 mg  total) by mouth daily.   divalproex (DEPAKOTE) 125 MG DR tablet Take 1 tablet (125 mg total) by mouth 2 (two) times daily.   donepezil (ARICEPT) 10 MG tablet TAKE ONE TABLET BY MOUTH EVERY EVENING   fluticasone (FLONASE) 50 MCG/ACT nasal spray Place 2 sprays into both nostrils daily as needed for allergies or rhinitis.   isosorbide mononitrate (IMDUR) 30 MG 24 hr tablet TAKE 1/2 TABLET BY MOUTH EVERY MORNING   loratadine (CLARITIN) 10 MG tablet Take 10 mg by mouth daily as needed for allergies.   LORazepam (ATIVAN) 0.5 MG tablet Take 1 tablet (0.5 mg total) by mouth  3 (three) times daily as needed for anxiety. agitation   memantine (NAMENDA) 10 MG tablet Take 1 tablet (10 mg total) by mouth 2 (two) times daily.   nitroGLYCERIN (NITROSTAT) 0.4 MG SL tablet Place 1 tablet (0.4 mg total) under the tongue every 5 (five) minutes as needed for chest pain.   prednisoLONE acetate (PRED FORTE) 1 % ophthalmic suspension Place 1 drop into both eyes as needed.   SYNTHROID 75 MCG tablet Take 75 mcg by mouth daily.   valsartan (DIOVAN) 40 MG tablet Take 1 tablet (40 mg total) by mouth daily.   No facility-administered encounter medications on file as of 06/11/2022.    Review of Systems  Unable to perform ROS: Dementia    There were no vitals filed for this visit. There is no height or weight on file to calculate BMI. Physical Exam Vitals and nursing note reviewed.  Constitutional:      Comments: Patient is drowsy but arousable and able to follow orders  HENT:     Head: Normocephalic.     Nose: Nose normal.     Mouth/Throat:     Mouth: Mucous membranes are moist.  Eyes:     Extraocular Movements: Extraocular movements intact.     Pupils: Pupils are equal, round, and reactive to light.  Cardiovascular:     Rate and Rhythm: Normal rate. Rhythm irregular.  Pulmonary:     Effort: Pulmonary effort is normal.     Breath sounds: Normal breath sounds.  Abdominal:     General: Abdomen is flat. Bowel  sounds are normal.     Palpations: Abdomen is soft.  Musculoskeletal:     Cervical back: Normal range of motion.  Neurological:     General: No focal deficit present.     Mental Status: He is alert.     Comments: Oriented to person.  Most recent MMSE was 2/30  Psychiatric:     Comments: Patient exhibited some hostile behaviors yesterday and was medicated.  This may be a product of his adjustment to new environment.     Labs reviewed: Basic Metabolic Panel: No results for input(s): "NA", "K", "CL", "CO2", "GLUCOSE", "BUN", "CREATININE", "CALCIUM", "MG", "PHOS" in the last 8760 hours. Liver Function Tests: No results for input(s): "AST", "ALT", "ALKPHOS", "BILITOT", "PROT", "ALBUMIN" in the last 8760 hours. No results for input(s): "LIPASE", "AMYLASE" in the last 8760 hours. No results for input(s): "AMMONIA" in the last 8760 hours. CBC: No results for input(s): "WBC", "NEUTROABS", "HGB", "HCT", "MCV", "PLT" in the last 8760 hours. Cardiac Enzymes: No results for input(s): "CKTOTAL", "CKMB", "CKMBINDEX", "TROPONINI" in the last 8760 hours. BNP: Invalid input(s): "POCBNP" Lab Results  Component Value Date   HGBA1C 6.7 (H) 11/16/2020   No results found for: "TSH" No results found for: "VITAMINB12" No results found for: "FOLATE" No results found for: "IRON", "TIBC", "FERRITIN"  Imaging and Procedures obtained prior to SNF admission: CT HEAD WO CONTRAST (5MM)  Result Date: 05/17/2021 CLINICAL DATA:  Head trauma EXAM: CT HEAD WITHOUT CONTRAST CT CERVICAL SPINE WITHOUT CONTRAST TECHNIQUE: Multidetector CT imaging of the head and cervical spine was performed following the standard protocol without intravenous contrast. Multiplanar CT image reconstructions of the cervical spine were also generated. COMPARISON:  None. FINDINGS: CT HEAD FINDINGS Brain: No evidence of large-territorial acute infarction. No parenchymal hemorrhage. No mass lesion. There is a 1 cm right calvarial convexity  hematoma formation with density slightly hypodense to the gray matter. No mass effect or midline shift. No  hydrocephalus. Basilar cisterns are patent. Vascular: No hyperdense vessel. Skull: No acute fracture or focal lesion. Sinuses/Orbits: Paranasal sinuses and mastoid air cells are clear. The orbits are unremarkable. Other: None. CT CERVICAL SPINE FINDINGS Alignment: Normal. Skull base and vertebrae: Multilevel severe degenerative changes of the spine. No acute fracture. No aggressive appearing focal osseous lesion or focal pathologic process. Soft tissues and spinal canal: No prevertebral fluid or swelling. No visible canal hematoma. Upper chest: Biapical pleural/pulmonary scarring. Other: Partially visualized cardiac leads on the right. IMPRESSION: 1. Likely subacute 1 cm right subdural hematoma with no associated shift. 2. No acute displaced fracture or traumatic listhesis of the cervical spine. These results were called by telephone at the time of interpretation on 05/17/2021 at 10:47 pm to provider JULIE HAVILAND , who verbally acknowledged these results. Electronically Signed   By: Iven Finn M.D.   On: 05/17/2021 22:51   CT Cervical Spine Wo Contrast  Result Date: 05/17/2021 CLINICAL DATA:  Head trauma EXAM: CT HEAD WITHOUT CONTRAST CT CERVICAL SPINE WITHOUT CONTRAST TECHNIQUE: Multidetector CT imaging of the head and cervical spine was performed following the standard protocol without intravenous contrast. Multiplanar CT image reconstructions of the cervical spine were also generated. COMPARISON:  None. FINDINGS: CT HEAD FINDINGS Brain: No evidence of large-territorial acute infarction. No parenchymal hemorrhage. No mass lesion. There is a 1 cm right calvarial convexity hematoma formation with density slightly hypodense to the gray matter. No mass effect or midline shift. No hydrocephalus. Basilar cisterns are patent. Vascular: No hyperdense vessel. Skull: No acute fracture or focal lesion.  Sinuses/Orbits: Paranasal sinuses and mastoid air cells are clear. The orbits are unremarkable. Other: None. CT CERVICAL SPINE FINDINGS Alignment: Normal. Skull base and vertebrae: Multilevel severe degenerative changes of the spine. No acute fracture. No aggressive appearing focal osseous lesion or focal pathologic process. Soft tissues and spinal canal: No prevertebral fluid or swelling. No visible canal hematoma. Upper chest: Biapical pleural/pulmonary scarring. Other: Partially visualized cardiac leads on the right. IMPRESSION: 1. Likely subacute 1 cm right subdural hematoma with no associated shift. 2. No acute displaced fracture or traumatic listhesis of the cervical spine. These results were called by telephone at the time of interpretation on 05/17/2021 at 10:47 pm to provider JULIE HAVILAND , who verbally acknowledged these results. Electronically Signed   By: Iven Finn M.D.   On: 05/17/2021 22:51   DG Elbow Complete Left  Result Date: 05/17/2021 CLINICAL DATA:  Two trauma EXAM: LEFT ELBOW - COMPLETE 3+ VIEW COMPARISON:  X-ray left forearm 05/17/2021 10:16 p.m. FINDINGS: There is no evidence of fracture, dislocation, or joint effusion. There is no evidence of arthropathy or other focal bone abnormality. Soft tissues are unremarkable. IMPRESSION: Negative. Electronically Signed   By: Iven Finn M.D.   On: 05/17/2021 22:37   DG Pelvis Portable  Result Date: 05/17/2021 CLINICAL DATA:  Trauma EXAM: PORTABLE PELVIS 1-2 VIEWS COMPARISON:  None. FINDINGS: Pubic symphysis and rami are intact. The SI joints are non widened. Both femoral heads project in joint. There are mild degenerative changes of the bilateral hips. IMPRESSION: No acute osseous abnormality Electronically Signed   By: Donavan Foil M.D.   On: 05/17/2021 22:31   DG Forearm Left  Result Date: 05/17/2021 CLINICAL DATA:  Fall, level 2 trauma EXAM: LEFT FOREARM - 2 VIEW COMPARISON:  None. FINDINGS: No fracture or dislocation is seen.  The joint spaces are preserved. Visualized soft tissues are within normal limits. IMPRESSION: Negative. Electronically Signed   By:  Julian Hy M.D.   On: 05/17/2021 22:31   DG Chest Port 1 View  Result Date: 05/17/2021 CLINICAL DATA:  Level 2 trauma. EXAM: PORTABLE CHEST 1 VIEW COMPARISON:  Chest radiograph dated 09/02/2020. FINDINGS: No focal consolidation pleural effusion, pneumothorax. The cardiac silhouette is within limits. Atherosclerotic calcification of the aorta. Right pectoral pacemaker device. No acute osseous pathology. IMPRESSION: No active disease. Electronically Signed   By: Anner Crete M.D.   On: 05/17/2021 22:31    Assessment/Plan 1. Chronic renal impairment, stage 3a ([place]) Patient is on Farxiga both for control of chronic kidney disease and diabetes  2. 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  Using Farxiga  3. Diabetic nephropathy associated with type 2 diabetes mellitus ([place]) See above  4. Primary hypertension Nurses report blood pressures have been elevated recently we will increase valsartan from 20 to 40 mg  5. Late onset Alzheimer's dementia with behavioral disturbance [facility]) Patient following a typical course with Alzheimer's.  Sleeping more eating less weight loss behaviors.  Depakote has been added for behaviors.  I have suggested we taper Aricept since I do not think that contributes much.  Neurology has recommended same.  We will continue memantine  6. PAF (paroxysmal atrial fibrillation) ([place]) Followed by cardiology.  He does take apixaban    Family/ staff Communication: none  Labs/tests ordered:  Lillette Boxer. Sabra Heck, [place] [address] [place], [place] Office [phone]

## 2022-06-13 ENCOUNTER — Non-Acute Institutional Stay (SKILLED_NURSING_FACILITY): Payer: PPO | Admitting: Nurse Practitioner

## 2022-06-13 ENCOUNTER — Encounter: Payer: Self-pay | Admitting: Nurse Practitioner

## 2022-06-13 DIAGNOSIS — I480 Paroxysmal atrial fibrillation: Secondary | ICD-10-CM

## 2022-06-13 DIAGNOSIS — E039 Hypothyroidism, unspecified: Secondary | ICD-10-CM | POA: Diagnosis not present

## 2022-06-13 DIAGNOSIS — F02818 Dementia in other diseases classified elsewhere, unspecified severity, with other behavioral disturbance: Secondary | ICD-10-CM

## 2022-06-13 DIAGNOSIS — G301 Alzheimer's disease with late onset: Secondary | ICD-10-CM | POA: Diagnosis not present

## 2022-06-13 DIAGNOSIS — I5020 Unspecified systolic (congestive) heart failure: Secondary | ICD-10-CM

## 2022-06-13 DIAGNOSIS — I442 Atrioventricular block, complete: Secondary | ICD-10-CM | POA: Diagnosis not present

## 2022-06-13 DIAGNOSIS — I10 Essential (primary) hypertension: Secondary | ICD-10-CM

## 2022-06-13 DIAGNOSIS — E0849 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: Secondary | ICD-10-CM | POA: Diagnosis not present

## 2022-06-13 DIAGNOSIS — E7800 Pure hypercholesterolemia, unspecified: Secondary | ICD-10-CM | POA: Diagnosis not present

## 2022-06-13 NOTE — Assessment & Plan Note (Signed)
Blood pressure is controlled, continue Valsartan.  

## 2022-06-13 NOTE — Assessment & Plan Note (Signed)
on Statin, Isosorbide, prn NTG,  takes [place]

## 2022-06-13 NOTE — Assessment & Plan Note (Signed)
followed by Cardiology, heart rate is in control.

## 2022-06-13 NOTE — Progress Notes (Signed)
This encounter was created in error - please disregard.

## 2022-06-13 NOTE — Assessment & Plan Note (Signed)
taking Levothyroxine

## 2022-06-13 NOTE — Assessment & Plan Note (Signed)
Has pacemaker  

## 2022-06-13 NOTE — Assessment & Plan Note (Signed)
Diet controlled.  

## 2022-06-13 NOTE — Assessment & Plan Note (Signed)
takes Atorvastatin  

## 2022-06-13 NOTE — Assessment & Plan Note (Addendum)
agitation, combative behaviors, prn Lorazepam is not effective. MMSE 2/30 06/07/22. Prn Lorazepam tid and Depakote 125mg  bid started 06/08/22.  taper off  Donepezil, Memantine, followed by Neurology Will have Haldol 2mg  IM/Po daily prn, start Risperdal 0.25mg  qd, may give Lorazepam topical if po/Im impossible.

## 2022-06-13 NOTE — Progress Notes (Signed)
Location:  [facility] Room Number: NO/110/A Place of Service:  SNF (31) Provider:  Jakiya Bookbinder X, NP  Patient Care Team: Angle Dirusso X, NP as PCP - General (Internal Medicine) Sueanne Margarita, MD as PCP - Cardiology (Cardiology) Seward Carol, MD (Internal Medicine)  Extended Emergency Contact Information Primary Emergency Contact: Ericson,[name] Address: [address]          [place], [place] [address] [name] of [place] Phone: [phone] Mobile Phone: [phone] Relation: Spouse Secondary Emergency Contact: [place] Phone: [phone] Mobile Phone: [phone] Relation: Spouse  Code Status:   Goals of care: Advanced Directive information    06/10/2022    2:45 PM  Advanced Directives  Does Patient Have a Medical Advance Directive? No     Chief Complaint  Patient presents with   Acute Visit    Patient is being seen for agaitation    HPI:  Pt is a 88 y.o. male seen today for an acute visit for agitation, combative behaviors, prn Lorazepam is not effective.  MMSE 2/30 06/07/22. Prn Lorazepam tid and Depakote 125mg  bid started 06/08/22.              Alzheimer's dementia, taper off  Donepezil, Memantine, followed by Neurology             Afib followed by Cardiology, heart rate is in control.              CAD, on Statin, Isosorbide, prn NTG,              CHFrEF, takes [place]             T2DM, diet controlled             Hypothyroidism, taking Levothyroxine             Hyperlipidemia, takes Atorvastatin             HTN, takes Valsartan             CKD chronic.      Past Medical History:  Diagnosis Date   Allergic rhinitis, cause unspecified    BPH (benign prostatic hyperplasia)    Carotid artery stenosis, asymptomatic    1-39% by dopplers 09/2016 followed by Dr. Oneida Alar   Cataracts, bilateral    Chronic kidney disease (CKD), stage III (moderate) ([place])    Complete heart block ([place]) 01/23/04   s/p Medtronic PPM  implanted by Dr Leonia Reeves   COPD (chronic obstructive pulmonary disease) ([place])    Diabetes mellitus    Glaucoma    Hyperlipidemia    Hypertension    Insomnia due to medical condition 08/10/2014   Kidney stones    PAF (paroxysmal atrial fibrillation) ([place]) 10/09/2014   Noted on pacer check with 88 mode switches and longest episode >1 hour.  Now on Apixiban for [place] score of 5   Past Surgical History:  Procedure Laterality Date   PACEMAKER INSERTION  2005, 04/16/12   MDT implanted by Dr Leonia Reeves with generator chnage (MDT Adapta L) by Dr Rayann Heman 04/16/12   PERMANENT PACEMAKER GENERATOR CHANGE N/A 04/16/2012   Procedure: PERMANENT PACEMAKER GENERATOR CHANGE;  Surgeon: Thompson Grayer, MD;  Location: [facility] CATH LAB;  Service: Cardiovascular;  Laterality: N/A;   RIGHT/LEFT HEART CATH AND CORONARY ANGIOGRAPHY N/A 10/18/2020   Procedure: RIGHT/LEFT HEART CATH AND CORONARY ANGIOGRAPHY;  Surgeon: Troy Sine, MD;  Location: [facility] CV LAB;  Service: Cardiovascular;  Laterality: N/A;    Allergies  Allergen  Reactions   Penicillins Rash    Reaction: Childhood   Penicillin G Benzathine Rash   Penicillins Rash    Outpatient Encounter Medications as of 06/13/2022  Medication Sig   atorvastatin (LIPITOR) 80 MG tablet TAKE ONE TABLET BY MOUTH EVERY EVENING   Calcium Carb-Cholecalciferol (CALCIUM 600 + D PO) Take 1 tablet by mouth daily. 10 mcg vit d   cetirizine (ZYRTEC) 10 MG tablet Take 10 mg by mouth daily as needed for allergies. For allergies. Takes prn   dapagliflozin propanediol (FARXIGA) 10 MG TABS tablet Take 1 tablet (10 mg total) by mouth daily.   divalproex (DEPAKOTE) 125 MG DR tablet Take 1 tablet (125 mg total) by mouth 2 (two) times daily.   donepezil (ARICEPT) 10 MG tablet TAKE ONE TABLET BY MOUTH EVERY EVENING   fluticasone (FLONASE) 50 MCG/ACT nasal spray Place 2 sprays into both nostrils daily as needed for allergies or rhinitis.   isosorbide mononitrate (IMDUR) 30 MG 24 hr tablet  TAKE 1/2 TABLET BY MOUTH EVERY MORNING   loratadine (CLARITIN) 10 MG tablet Take 10 mg by mouth daily as needed for allergies.   LORazepam (ATIVAN) 0.5 MG tablet Take 1 tablet (0.5 mg total) by mouth 3 (three) times daily as needed for anxiety. agitation   memantine (NAMENDA) 10 MG tablet Take 1 tablet (10 mg total) by mouth 2 (two) times daily.   nitroGLYCERIN (NITROSTAT) 0.4 MG SL tablet Place 1 tablet (0.4 mg total) under the tongue every 5 (five) minutes as needed for chest pain.   prednisoLONE acetate (PRED FORTE) 1 % ophthalmic suspension Place 1 drop into both eyes as needed.   SYNTHROID 75 MCG tablet Take 75 mcg by mouth daily.   valsartan (DIOVAN) 40 MG tablet Take 1 tablet (40 mg total) by mouth daily.   No facility-administered encounter medications on file as of 06/13/2022.    Review of Systems  Constitutional:  Negative for appetite change, fatigue and fever.  HENT:  Negative for congestion and trouble swallowing.   Eyes:  Negative for visual disturbance.  Respiratory:  Negative for cough, shortness of breath and wheezing.   Cardiovascular:  Negative for chest pain, palpitations and leg swelling.  Gastrointestinal:  Negative for abdominal pain, constipation and nausea.  Genitourinary:  Negative for dysuria, frequency and urgency.  Musculoskeletal:  Positive for arthralgias and gait problem.  Skin:  Negative for color change.  Neurological:  Negative for speech difficulty, weakness and headaches.  Psychiatric/Behavioral:  Positive for agitation, behavioral problems and confusion. Negative for sleep disturbance. The patient is not nervous/anxious.     Immunization History  Administered Date(s) Administered   Influenza-Unspecified 07/04/2014   Moderna SARS-COV2 Booster Vaccination 07/25/2020, 02/13/2021   Moderna Sars-Covid-2 Vaccination 09/20/2019, 10/18/2019   Pfizer Covid-19 Vaccine Bivalent Booster 17yrs & up 06/20/2021   Zoster Recombinat (Shingrix) 11/06/2017,  03/28/2018   Pertinent  Health Maintenance Due  Topic Date Due   FOOT EXAM  Never done   OPHTHALMOLOGY EXAM  Never done   HEMOGLOBIN A1C  05/19/2021   INFLUENZA VACCINE  04/16/2022      09/03/2020    7:45 PM 09/04/2020   11:00 AM 10/18/2020    8:17 AM 05/17/2021   10:12 PM 06/07/2022    8:41 AM  Fall Risk  Falls in the past year?     0  Was there an injury with Fall?     0  Fall Risk Category Calculator     0  Fall Risk Category  Low  Patient Fall Risk Level High fall risk High fall risk High fall risk Moderate fall risk Moderate fall risk  Patient at Risk for Falls Due to     History of fall(s)  Fall risk Follow up     Falls evaluation completed   Functional Status Survey:    Vitals:   06/13/22 1137  BP: (!) 140/69  Pulse: 69  Resp: 18  Temp: (!) 97.2 F (36.2 C)  SpO2: 93%  Weight: 137 lb (62.1 kg)  Height: 5\' 6"  (1.676 m)   Body mass index is 22.11 kg/m. Physical Exam Vitals and nursing note reviewed.  Constitutional:      Appearance: Normal appearance.  HENT:     Head: Normocephalic and atraumatic.     Nose: Nose normal.     Mouth/Throat:     Mouth: Mucous membranes are moist.  Eyes:     Extraocular Movements: Extraocular movements intact.     Conjunctiva/sclera: Conjunctivae normal.     Pupils: Pupils are equal, round, and reactive to light.  Cardiovascular:     Rate and Rhythm: Normal rate and regular rhythm.     Heart sounds: No murmur heard. Pulmonary:     Effort: Pulmonary effort is normal.     Breath sounds: No rales.  Abdominal:     General: Bowel sounds are normal.     Palpations: Abdomen is soft.     Tenderness: There is no abdominal tenderness.  Musculoskeletal:     Cervical back: Normal range of motion and neck supple.     Right lower leg: No edema.     Left lower leg: No edema.  Skin:    General: Skin is warm and dry.  Neurological:     General: No focal deficit present.     Mental Status: He is alert. Mental status is at baseline.      Gait: Gait abnormal.     Comments: Oriented to person  Psychiatric:     Comments: Flat affect, non cooperative.      Labs reviewed: No results for input(s): "NA", "K", "CL", "CO2", "GLUCOSE", "BUN", "CREATININE", "CALCIUM", "MG", "PHOS" in the last 8760 hours. No results for input(s): "AST", "ALT", "ALKPHOS", "BILITOT", "PROT", "ALBUMIN" in the last 8760 hours. No results for input(s): "WBC", "NEUTROABS", "HGB", "HCT", "MCV", "PLT" in the last 8760 hours. No results found for: "TSH" Lab Results  Component Value Date   HGBA1C 6.7 (H) 11/16/2020   Lab Results  Component Value Date   CHOL 128 12/16/2016   HDL 55 12/16/2016   LDLCALC 58 12/16/2016   TRIG 74 12/16/2016   CHOLHDL 2.3 12/16/2016    Significant Diagnostic Results in last 30 days:  No results found.  Assessment/Plan Late onset Alzheimer's dementia with behavioral disturbance (HCC) agitation, combative behaviors, prn Lorazepam is not effective. MMSE 2/30 06/07/22. Prn Lorazepam tid and Depakote 125mg  bid started 06/08/22.  taper off  Donepezil, Memantine, followed by Neurology Will have Haldol 2mg  IM/Po daily prn, start Risperdal 0.25mg  qd, may give Lorazepam topical if po/Im impossible.   PAF (paroxysmal atrial fibrillation) ([place]) followed by Cardiology, heart rate is in control.   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   Hypertension Blood pressure is controlled, continue Valsartan.  Family/ staff Communication: plan of care reviewed with the patient, the patient's wife,  and charge nurse.   Labs/tests ordered:  none  Time spend 35 minutes.

## 2022-06-17 DIAGNOSIS — M6281 Muscle weakness (generalized): Secondary | ICD-10-CM | POA: Diagnosis not present

## 2022-06-17 DIAGNOSIS — R1313 Dysphagia, pharyngeal phase: Secondary | ICD-10-CM | POA: Diagnosis not present

## 2022-06-17 DIAGNOSIS — R41841 Cognitive communication deficit: Secondary | ICD-10-CM | POA: Diagnosis not present

## 2022-06-17 DIAGNOSIS — R29898 Other symptoms and signs involving the musculoskeletal system: Secondary | ICD-10-CM | POA: Diagnosis not present

## 2022-06-17 DIAGNOSIS — R2681 Unsteadiness on feet: Secondary | ICD-10-CM | POA: Diagnosis not present

## 2022-06-17 DIAGNOSIS — M25552 Pain in left hip: Secondary | ICD-10-CM | POA: Diagnosis not present

## 2022-06-17 DIAGNOSIS — M79642 Pain in left hand: Secondary | ICD-10-CM | POA: Diagnosis not present

## 2022-06-20 DIAGNOSIS — I10 Essential (primary) hypertension: Secondary | ICD-10-CM | POA: Diagnosis not present

## 2022-06-24 ENCOUNTER — Encounter: Payer: Self-pay | Admitting: Nurse Practitioner

## 2022-06-24 DIAGNOSIS — R1310 Dysphagia, unspecified: Secondary | ICD-10-CM | POA: Insufficient documentation

## 2022-06-26 ENCOUNTER — Encounter: Payer: Self-pay | Admitting: Adult Health

## 2022-06-26 ENCOUNTER — Non-Acute Institutional Stay (SKILLED_NURSING_FACILITY): Payer: PPO | Admitting: Adult Health

## 2022-06-26 DIAGNOSIS — I480 Paroxysmal atrial fibrillation: Secondary | ICD-10-CM

## 2022-06-26 DIAGNOSIS — F03911 Unspecified dementia, unspecified severity, with agitation: Secondary | ICD-10-CM

## 2022-06-26 DIAGNOSIS — I420 Dilated cardiomyopathy: Secondary | ICD-10-CM | POA: Diagnosis not present

## 2022-06-26 DIAGNOSIS — R4182 Altered mental status, unspecified: Secondary | ICD-10-CM | POA: Diagnosis not present

## 2022-06-26 MED ORDER — DIVALPROEX SODIUM 125 MG PO DR TAB: 250.0000 mg | DELAYED_RELEASE_TABLET | Freq: Two times a day (BID) | ORAL | 0 refills | Status: DC

## 2022-06-26 MED ORDER — LORAZEPAM 2 MG/ML IJ SOLN: 2.0000 mg | Freq: Three times a day (TID) | INTRAMUSCULAR | 0 refills | Status: DC

## 2022-06-26 MED ORDER — LORAZEPAM 2 MG/ML PO CONC: ORAL | 0 refills | Status: DC

## 2022-06-26 MED ORDER — RISPERIDONE 0.25 MG PO TABS: 0.2500 mg | ORAL_TABLET | Freq: Two times a day (BID) | ORAL | 5 refills | Status: DC
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
## 2022-06-27 ENCOUNTER — Other Ambulatory Visit: Payer: Self-pay | Admitting: Cardiology

## 2022-06-27 DIAGNOSIS — I480 Paroxysmal atrial fibrillation: Secondary | ICD-10-CM

## 2022-06-27 NOTE — Telephone Encounter (Signed)
Eliquis 2.5mg  refill request received. Patient is 87 years old, weight-62.6kg, Crea-1.72 on 01/16/2022 via [facility] from [place], [place], and last seen by Dr. Radford Pax on 10/04/2021. Dose is appropriate based on dosing criteria. Will send in refill to requested pharmacy.

## 2022-06-29 ENCOUNTER — Inpatient Hospital Stay (HOSPITAL_COMMUNITY)
Admission: EM | Admit: 2022-06-29 | Discharge: 2022-07-03 | DRG: 177 | Disposition: A | Discharge status: Skilled Nursing Facility | Payer: PPO | Source: Skilled Nursing Facility | Attending: Internal Medicine | Admitting: Internal Medicine

## 2022-06-29 ENCOUNTER — Emergency Department (HOSPITAL_COMMUNITY): Payer: PPO

## 2022-06-29 ENCOUNTER — Telehealth: Payer: Self-pay | Admitting: Adult Health

## 2022-06-29 DIAGNOSIS — R0989 Other specified symptoms and signs involving the circulatory and respiratory systems: Secondary | ICD-10-CM | POA: Diagnosis not present

## 2022-06-29 DIAGNOSIS — N400 Enlarged prostate without lower urinary tract symptoms: Secondary | ICD-10-CM | POA: Diagnosis present

## 2022-06-29 DIAGNOSIS — G9340 Encephalopathy, unspecified: Secondary | ICD-10-CM | POA: Diagnosis present

## 2022-06-29 DIAGNOSIS — Z7401 Bed confinement status: Secondary | ICD-10-CM | POA: Diagnosis not present

## 2022-06-29 DIAGNOSIS — I130 Hypertensive heart and chronic kidney disease with heart failure and stage 1 through stage 4 chronic kidney disease, or unspecified chronic kidney disease: Secondary | ICD-10-CM | POA: Diagnosis present

## 2022-06-29 DIAGNOSIS — J9601 Acute respiratory failure with hypoxia: Secondary | ICD-10-CM | POA: Diagnosis present

## 2022-06-29 DIAGNOSIS — Z79890 Hormone replacement therapy (postmenopausal): Secondary | ICD-10-CM

## 2022-06-29 DIAGNOSIS — Z880 Allergy status to penicillin: Secondary | ICD-10-CM

## 2022-06-29 DIAGNOSIS — Z7189 Other specified counseling: Secondary | ICD-10-CM | POA: Diagnosis not present

## 2022-06-29 DIAGNOSIS — Z820 Family history of epilepsy and other diseases of the nervous system: Secondary | ICD-10-CM | POA: Diagnosis not present

## 2022-06-29 DIAGNOSIS — I5020 Unspecified systolic (congestive) heart failure: Secondary | ICD-10-CM | POA: Diagnosis present

## 2022-06-29 DIAGNOSIS — R509 Fever, unspecified: Secondary | ICD-10-CM | POA: Diagnosis not present

## 2022-06-29 DIAGNOSIS — Z87442 Personal history of urinary calculi: Secondary | ICD-10-CM

## 2022-06-29 DIAGNOSIS — E875 Hyperkalemia: Secondary | ICD-10-CM | POA: Diagnosis not present

## 2022-06-29 DIAGNOSIS — J449 Chronic obstructive pulmonary disease, unspecified: Secondary | ICD-10-CM | POA: Diagnosis not present

## 2022-06-29 DIAGNOSIS — E785 Hyperlipidemia, unspecified: Secondary | ICD-10-CM | POA: Diagnosis present

## 2022-06-29 DIAGNOSIS — Z66 Do not resuscitate: Secondary | ICD-10-CM | POA: Diagnosis not present

## 2022-06-29 DIAGNOSIS — U071 COVID-19: Secondary | ICD-10-CM | POA: Diagnosis not present

## 2022-06-29 DIAGNOSIS — I442 Atrioventricular block, complete: Secondary | ICD-10-CM | POA: Diagnosis present

## 2022-06-29 DIAGNOSIS — Z950 Presence of cardiac pacemaker: Secondary | ICD-10-CM | POA: Diagnosis not present

## 2022-06-29 DIAGNOSIS — F02818 Dementia in other diseases classified elsewhere, unspecified severity, with other behavioral disturbance: Secondary | ICD-10-CM | POA: Diagnosis present

## 2022-06-29 DIAGNOSIS — E0849 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: Secondary | ICD-10-CM | POA: Diagnosis present

## 2022-06-29 DIAGNOSIS — F03918 Unspecified dementia, unspecified severity, with other behavioral disturbance: Secondary | ICD-10-CM | POA: Diagnosis present

## 2022-06-29 DIAGNOSIS — I2510 Atherosclerotic heart disease of native coronary artery without angina pectoris: Secondary | ICD-10-CM | POA: Diagnosis present

## 2022-06-29 DIAGNOSIS — I480 Paroxysmal atrial fibrillation: Secondary | ICD-10-CM | POA: Diagnosis present

## 2022-06-29 DIAGNOSIS — Z8249 Family history of ischemic heart disease and other diseases of the circulatory system: Secondary | ICD-10-CM

## 2022-06-29 DIAGNOSIS — R531 Weakness: Secondary | ICD-10-CM | POA: Diagnosis not present

## 2022-06-29 DIAGNOSIS — G301 Alzheimer's disease with late onset: Secondary | ICD-10-CM | POA: Diagnosis present

## 2022-06-29 DIAGNOSIS — G9341 Metabolic encephalopathy: Secondary | ICD-10-CM | POA: Diagnosis not present

## 2022-06-29 DIAGNOSIS — R9431 Abnormal electrocardiogram [ECG] [EKG]: Secondary | ICD-10-CM | POA: Diagnosis not present

## 2022-06-29 DIAGNOSIS — Z7901 Long term (current) use of anticoagulants: Secondary | ICD-10-CM

## 2022-06-29 DIAGNOSIS — R0902 Hypoxemia: Secondary | ICD-10-CM | POA: Diagnosis not present

## 2022-06-29 DIAGNOSIS — Z79899 Other long term (current) drug therapy: Secondary | ICD-10-CM

## 2022-06-29 DIAGNOSIS — E1122 Type 2 diabetes mellitus with diabetic chronic kidney disease: Secondary | ICD-10-CM | POA: Diagnosis present

## 2022-06-29 DIAGNOSIS — I5022 Chronic systolic (congestive) heart failure: Secondary | ICD-10-CM | POA: Diagnosis present

## 2022-06-29 DIAGNOSIS — R404 Transient alteration of awareness: Secondary | ICD-10-CM | POA: Diagnosis not present

## 2022-06-29 DIAGNOSIS — N1832 Chronic kidney disease, stage 3b: Secondary | ICD-10-CM | POA: Diagnosis present

## 2022-06-29 DIAGNOSIS — S2231XA Fracture of one rib, right side, initial encounter for closed fracture: Secondary | ICD-10-CM | POA: Diagnosis not present

## 2022-06-29 HISTORY — DX: Encephalopathy, unspecified: G93.40

## 2022-06-29 LAB — COMPREHENSIVE METABOLIC PANEL
ALT: 29 U/L (ref 0–44)
AST: 25 U/L (ref 15–41)
Albumin: 3.6 g/dL (ref 3.5–5.0)
Alkaline Phosphatase: 77 U/L (ref 38–126)
Anion gap: 10 (ref 5–15)
BUN: 21 mg/dL (ref 8–23)
CO2: 24 mmol/L (ref 22–32)
Calcium: 9.1 mg/dL (ref 8.9–10.3)
Chloride: 110 mmol/L (ref 98–111)
Creatinine, Ser: 1.68 mg/dL — ABNORMAL HIGH (ref 0.61–1.24)
GFR, Estimated: 39 mL/min — ABNORMAL LOW (ref >60)
Glucose, Bld: 171 mg/dL — ABNORMAL HIGH (ref 70–99)
Potassium: 4.3 mmol/L (ref 3.5–5.1)
Sodium: 144 mmol/L (ref 135–145)
Total Bilirubin: 0.5 mg/dL (ref 0.3–1.2)
Total Protein: 6.2 g/dL — ABNORMAL LOW (ref 6.5–8.1)

## 2022-06-29 LAB — I-STAT VENOUS BLOOD GAS, ED
Acid-base deficit: 8 mmol/L — ABNORMAL HIGH (ref 0.0–2.0)
Bicarbonate: 14.7 mmol/L — ABNORMAL LOW (ref 20.0–28.0)
Calcium, Ion: 0.66 mmol/L — CL (ref 1.15–1.40)
HCT: 29 % — ABNORMAL LOW (ref 39.0–52.0)
Hemoglobin: 9.9 g/dL — ABNORMAL LOW (ref 13.0–17.0)
O2 Saturation: 100 %
Potassium: 6.9 mmol/L (ref 3.5–5.1)
Sodium: 146 mmol/L — ABNORMAL HIGH (ref 135–145)
TCO2: 15 mmol/L — ABNORMAL LOW (ref 22–32)
pCO2, Ven: 22.4 mmHg — ABNORMAL LOW (ref 44–60)
pH, Ven: 7.427 (ref 7.25–7.43)
pO2, Ven: 193 mmHg — ABNORMAL HIGH (ref 32–45)

## 2022-06-29 LAB — PROCALCITONIN: Procalcitonin: <0.1 ng/mL

## 2022-06-29 LAB — CBC WITH DIFFERENTIAL/PLATELET
Abs Immature Granulocytes: 0.03 10*3/uL (ref 0.00–0.07)
Basophils Absolute: 0 10*3/uL (ref 0.0–0.1)
Basophils Relative: 0 %
Eosinophils Absolute: 0 10*3/uL (ref 0.0–0.5)
Eosinophils Relative: 1 %
HCT: 44.5 % (ref 39.0–52.0)
Hemoglobin: 14.4 g/dL (ref 13.0–17.0)
Immature Granulocytes: 1 %
Lymphocytes Relative: 6 %
Lymphs Abs: 0.4 10*3/uL — ABNORMAL LOW (ref 0.7–4.0)
MCH: 26.8 pg (ref 26.0–34.0)
MCHC: 32.4 g/dL (ref 30.0–36.0)
MCV: 82.9 fL (ref 80.0–100.0)
Monocytes Absolute: 1.2 10*3/uL — ABNORMAL HIGH (ref 0.1–1.0)
Monocytes Relative: 18 %
Neutro Abs: 4.8 10*3/uL (ref 1.7–7.7)
Neutrophils Relative %: 74 %
Platelets: 222 10*3/uL (ref 150–400)
RBC: 5.37 MIL/uL (ref 4.22–5.81)
RDW: 16.4 % — ABNORMAL HIGH (ref 11.5–15.5)
WBC: 6.4 10*3/uL (ref 4.0–10.5)
nRBC: 0 % (ref 0.0–0.2)

## 2022-06-29 LAB — BASIC METABOLIC PANEL
Anion gap: 11 (ref 5–15)
BUN: 22 mg/dL (ref 8–23)
CO2: 21 mmol/L — ABNORMAL LOW (ref 22–32)
Calcium: 8.8 mg/dL — ABNORMAL LOW (ref 8.9–10.3)
Chloride: 111 mmol/L (ref 98–111)
Creatinine, Ser: 1.67 mg/dL — ABNORMAL HIGH (ref 0.61–1.24)
GFR, Estimated: 39 mL/min — ABNORMAL LOW (ref >60)
Glucose, Bld: 209 mg/dL — ABNORMAL HIGH (ref 70–99)
Potassium: 4.5 mmol/L (ref 3.5–5.1)
Sodium: 143 mmol/L (ref 135–145)

## 2022-06-29 LAB — TRIGLYCERIDES: Triglycerides: 97 mg/dL (ref <150)

## 2022-06-29 LAB — C-REACTIVE PROTEIN: CRP: 1.8 mg/dL — ABNORMAL HIGH (ref <1.0)

## 2022-06-29 LAB — FERRITIN: Ferritin: 101 ng/mL (ref 24–336)

## 2022-06-29 LAB — LACTATE DEHYDROGENASE: LDH: 162 U/L (ref 98–192)

## 2022-06-29 LAB — FIBRINOGEN: Fibrinogen: 369 mg/dL (ref 210–475)

## 2022-06-29 LAB — SARS CORONAVIRUS 2 BY RT PCR: SARS Coronavirus 2 by RT PCR: POSITIVE — AB

## 2022-06-29 LAB — CBG MONITORING, ED
Glucose-Capillary: 240 mg/dL — ABNORMAL HIGH (ref 70–99)
Glucose-Capillary: 188 mg/dL — ABNORMAL HIGH (ref 70–99)

## 2022-06-29 LAB — D-DIMER, QUANTITATIVE: D-Dimer, Quant: 0.35 ug/mL-FEU (ref 0.00–0.50)

## 2022-06-29 LAB — LACTIC ACID, PLASMA: Lactic Acid, Venous: 1.7 mmol/L (ref 0.5–1.9)

## 2022-06-29 LAB — HEMOGLOBIN A1C
Hgb A1c MFr Bld: 9.5 % — ABNORMAL HIGH (ref 4.8–5.6)
Mean Plasma Glucose: 225.95 mg/dL

## 2022-06-29 MED ORDER — ONDANSETRON HCL 4 MG/2ML IJ SOLN: 4.0000 mg | Freq: Four times a day (QID) | INTRAMUSCULAR | Status: DC | PRN

## 2022-06-29 MED ORDER — INSULIN ASPART 100 UNIT/ML IJ SOLN
0.0000 [IU] | INTRAMUSCULAR | Status: DC
  Administered 2022-06-29 – 2022-06-30 (×2): 2 [IU] via SUBCUTANEOUS
  Administered 2022-06-30: 3 [IU] via SUBCUTANEOUS
  Administered 2022-06-30 (×2): 2 [IU] via SUBCUTANEOUS
  Administered 2022-06-30: 1 [IU] via SUBCUTANEOUS
  Administered 2022-07-01: 3 [IU] via SUBCUTANEOUS
  Administered 2022-07-01 (×4): 2 [IU] via SUBCUTANEOUS
  Administered 2022-07-02: 1 [IU] via SUBCUTANEOUS
  Administered 2022-07-02: 2 [IU] via SUBCUTANEOUS
  Administered 2022-07-02 – 2022-07-03 (×3): 1 [IU] via SUBCUTANEOUS
  Administered 2022-07-03: 2 [IU] via SUBCUTANEOUS

## 2022-06-29 MED ORDER — MOLNUPIRAVIR EUA 200MG CAPSULE
4.0000 | ORAL_CAPSULE | Freq: Two times a day (BID) | ORAL | Status: DC
  Administered 2022-06-30 – 2022-07-03 (×5): 800 mg via ORAL
  Filled 2022-06-29 (×2): qty 4

## 2022-06-29 MED ORDER — ONDANSETRON HCL 4 MG PO TABS: 4.0000 mg | ORAL_TABLET | Freq: Four times a day (QID) | ORAL | Status: DC | PRN

## 2022-06-29 MED ORDER — STERILE WATER FOR INJECTION IJ SOLN
INTRAMUSCULAR | Status: AC
  Filled 2022-06-29: qty 10

## 2022-06-29 MED ORDER — APIXABAN 2.5 MG PO TABS
2.5000 mg | ORAL_TABLET | Freq: Two times a day (BID) | ORAL | Status: DC
  Administered 2022-06-30 – 2022-07-03 (×7): 2.5 mg via ORAL
  Filled 2022-06-29 (×8): qty 1

## 2022-06-29 MED ORDER — DONEPEZIL HCL 10 MG PO TABS
10.0000 mg | ORAL_TABLET | Freq: Every evening | ORAL | Status: DC
  Administered 2022-06-30 – 2022-07-02 (×2): 10 mg via ORAL
  Filled 2022-06-29 (×3): qty 1

## 2022-06-29 MED ORDER — ACETAMINOPHEN 325 MG PO TABS
650.0000 mg | ORAL_TABLET | Freq: Four times a day (QID) | ORAL | Status: DC | PRN
  Administered 2022-06-30 – 2022-07-03 (×3): 650 mg via ORAL
  Filled 2022-06-29 (×3): qty 2

## 2022-06-29 MED ORDER — MEMANTINE HCL 10 MG PO TABS
10.0000 mg | ORAL_TABLET | Freq: Two times a day (BID) | ORAL | Status: DC
  Administered 2022-06-30 – 2022-07-03 (×6): 10 mg via ORAL
  Filled 2022-06-29 (×8): qty 1

## 2022-06-29 MED ORDER — ATORVASTATIN CALCIUM 80 MG PO TABS
80.0000 mg | ORAL_TABLET | Freq: Every evening | ORAL | Status: DC
  Administered 2022-06-30 – 2022-07-02 (×2): 80 mg via ORAL
  Filled 2022-06-29 (×2): qty 1

## 2022-06-29 MED ORDER — DAPAGLIFLOZIN PROPANEDIOL 10 MG PO TABS: 10.0000 mg | ORAL_TABLET | Freq: Every day | ORAL | Status: DC

## 2022-06-29 MED ORDER — METHYLPREDNISOLONE SODIUM SUCC 125 MG IJ SOLR
65.0000 mg | Freq: Once | INTRAMUSCULAR | Status: AC
  Administered 2022-06-29: 65 mg via INTRAVENOUS
  Filled 2022-06-29: qty 2

## 2022-06-29 MED ORDER — ACETAMINOPHEN 325 MG PO TABS
650.0000 mg | ORAL_TABLET | Freq: Once | ORAL | Status: AC
  Administered 2022-06-29: 650 mg via ORAL
  Filled 2022-06-29: qty 2

## 2022-06-29 MED ORDER — SODIUM CHLORIDE 0.9 % IV SOLN: INTRAVENOUS | Status: DC

## 2022-06-29 MED ORDER — LORATADINE 10 MG PO TABS: 10.0000 mg | ORAL_TABLET | Freq: Every day | ORAL | Status: DC | PRN

## 2022-06-29 MED ORDER — LEVOTHYROXINE SODIUM 75 MCG PO TABS
75.0000 ug | ORAL_TABLET | Freq: Every day | ORAL | Status: DC
  Administered 2022-06-30 – 2022-07-03 (×4): 75 ug via ORAL
  Filled 2022-06-29 (×4): qty 1

## 2022-06-29 MED ORDER — DIVALPROEX SODIUM 250 MG PO DR TAB
250.0000 mg | DELAYED_RELEASE_TABLET | Freq: Two times a day (BID) | ORAL | Status: DC
  Administered 2022-06-30 – 2022-07-03 (×7): 250 mg via ORAL
  Filled 2022-06-29 (×8): qty 1

## 2022-06-29 MED ORDER — RISPERIDONE 0.25 MG PO TABS
0.2500 mg | ORAL_TABLET | Freq: Two times a day (BID) | ORAL | Status: DC
  Administered 2022-06-30 – 2022-07-03 (×7): 0.25 mg via ORAL
  Filled 2022-06-29 (×9): qty 1

## 2022-06-29 NOTE — ED Provider Notes (Signed)
[facility] EMERGENCY DEPARTMENT Provider Note   CSN: [id number] Arrival date & time: 06/29/22  1422     History  Chief Complaint  Patient presents with   Weakness    Edward Suarez is a 88 y.o. male.  HPI 88 year old male with multiple comorbidities which include A-fib, COPD, diabetes, previous subdural hematoma and other comorbidities presents with respiratory failure.  EMS is no longer present when I am seeing the patient.  Patient has had more weakness and lethargy since testing positive for COVID yesterday.  Comes from friend's home [place].  Patient is not very compliant with the exam and does not talk much with me or follow commands.  I talked to patient's son over the phone, and the patient is definitely DNR, but they are not sure why the MOST form indicates comfort measures only.  For that he would like fluids, treatments as necessary but not heroic measures, intubation, etc.  Home Medications Prior to Admission medications   Medication Sig Start Date End Date Taking? Authorizing Provider  apixaban (ELIQUIS) 2.5 MG TABS tablet TAKE ONE TABLET BY MOUTH TWICE DAILY 06/27/22   Sueanne Margarita, MD  atorvastatin (LIPITOR) 80 MG tablet TAKE ONE TABLET BY MOUTH EVERY EVENING 12/26/21   Sueanne Margarita, MD  Calcium Carb-Cholecalciferol (CALCIUM 600 + D PO) Take 1 tablet by mouth daily. 10 mcg vit d    [provider]  cetirizine (ZYRTEC) 10 MG tablet Take 10 mg by mouth daily as needed for allergies. For allergies. Takes prn    [provider]  dapagliflozin propanediol (FARXIGA) 10 MG TABS tablet Take 1 tablet (10 mg total) by mouth daily. 12/27/21   Sueanne Margarita, MD  divalproex (DEPAKOTE) 125 MG DR tablet Take 2 tablets (250 mg total) by mouth 2 (two) times daily. 06/26/22   Medina-Vargas, Monina C, NP  donepezil (ARICEPT) 10 MG tablet TAKE ONE TABLET BY MOUTH EVERY EVENING 05/30/22   Ward Givens, NP  fluticasone (FLONASE) 50 MCG/ACT nasal  spray Place 2 sprays into both nostrils daily as needed for allergies or rhinitis.    [provider]  isosorbide mononitrate (IMDUR) 30 MG 24 hr tablet TAKE 1/2 TABLET BY MOUTH EVERY MORNING 03/28/22   Sueanne Margarita, MD  loratadine (CLARITIN) 10 MG tablet Take 10 mg by mouth daily as needed for allergies.    [provider]  LORazepam (ATIVAN) 2 MG/ML injection Inject 1 mL (2 mg total) into the muscle every 8 (eight) hours. Give either topically or IM but cannot be given together. 06/26/22   Medina-Vargas, Monina C, NP  LORazepam (LORAZEPAM INTENSOL) 2 MG/ML concentrated solution 1mg /ml apply 2 mg gel to skin every 8 hours as needed. Give either topically or IM but cannot be given together. 06/26/22   Medina-Vargas, Monina C, NP  memantine (NAMENDA) 10 MG tablet Take 1 tablet (10 mg total) by mouth 2 (two) times daily. 02/17/19   Ward Givens, NP  nitroGLYCERIN (NITROSTAT) 0.4 MG SL tablet Place 1 tablet (0.4 mg total) under the tongue every 5 (five) minutes as needed for chest pain. 09/04/20   Swayze, Ava, DO  prednisoLONE acetate (PRED FORTE) 1 % ophthalmic suspension Place 1 drop into both eyes as needed. 11/10/20   [provider]  risperiDONE (RISPERDAL) 0.25 MG tablet Take 1 tablet (0.25 mg total) by mouth 2 (two) times daily. 06/26/22   Medina-Vargas, Monina C, NP  SYNTHROID 75 MCG tablet Take 75 mcg by mouth daily. 03/13/21  [provider]  valsartan (DIOVAN) 40 MG tablet Take 1 tablet (40 mg total) by mouth daily. 08/07/21   Sueanne Margarita, MD      Allergies    Penicillins, Penicillin g benzathine, and Penicillins    Review of Systems   Review of Systems  Unable to perform ROS: Mental status change    Physical Exam Updated Vital Signs BP (!) 143/59   Pulse 75   Temp 99.4 F (37.4 C) (Oral)   Resp 18   Ht 5\' 6"  (1.676 m)   Wt 62.6 kg   SpO2 93%   BMI 22.27 kg/m  Physical Exam Vitals and nursing note reviewed.  Constitutional:       Appearance: He is well-developed.  HENT:     Head: Normocephalic and atraumatic.  Cardiovascular:     Rate and Rhythm: Normal rate and regular rhythm.     Heart sounds: Normal heart sounds.  Pulmonary:     Effort: Pulmonary effort is normal.     Breath sounds: Rhonchi present.  Abdominal:     Palpations: Abdomen is soft.     Tenderness: There is no abdominal tenderness.  Skin:    General: Skin is warm and dry.     Comments: Patient's skin feels warm/febrile  Neurological:     Mental Status: He is lethargic.     Comments: Patient sometimes opens eyes but doesn't follow commands. He resists me when I try to move his upper extremities though does not resist with lower extremities or move them spontaneously.      ED Results / Procedures / Treatments   Labs (all labs ordered are listed, but only abnormal results are displayed) Labs Reviewed  SARS CORONAVIRUS 2 BY RT PCR - Abnormal; Notable for the following components:      Result Value   SARS Coronavirus 2 by RT PCR POSITIVE (*)    All other components within normal limits  CBC WITH DIFFERENTIAL/PLATELET - Abnormal; Notable for the following components:   RDW 16.4 (*)    Lymphs Abs 0.4 (*)    Monocytes Absolute 1.2 (*)    All other components within normal limits  COMPREHENSIVE METABOLIC PANEL - Abnormal; Notable for the following components:   Glucose, Bld 171 (*)    Creatinine, Ser 1.68 (*)    Total Protein 6.2 (*)    GFR, Estimated 39 (*)    All other components within normal limits  C-REACTIVE PROTEIN - Abnormal; Notable for the following components:   CRP 1.8 (*)    All other components within normal limits  BASIC METABOLIC PANEL - Abnormal; Notable for the following components:   CO2 21 (*)    Glucose, Bld 209 (*)    Creatinine, Ser 1.67 (*)    Calcium 8.8 (*)    GFR, Estimated 39 (*)    All other components within normal limits  HEMOGLOBIN A1C - Abnormal; Notable for the following components:   Hgb A1c MFr Bld  9.5 (*)    All other components within normal limits  CBG MONITORING, ED - Abnormal; Notable for the following components:   Glucose-Capillary 240 (*)    All other components within normal limits  I-STAT VENOUS BLOOD GAS, ED - Abnormal; Notable for the following components:   pCO2, Ven 22.4 (*)    pO2, Ven 193 (*)    Bicarbonate 14.7 (*)    TCO2 15 (*)    Acid-base deficit 8.0 (*)    Sodium 146 (*)  Potassium 6.9 (*)    Calcium, Ion 0.66 (*)    HCT 29.0 (*)    Hemoglobin 9.9 (*)    All other components within normal limits  CBG MONITORING, ED - Abnormal; Notable for the following components:   Glucose-Capillary 188 (*)    All other components within normal limits  CULTURE, BLOOD (ROUTINE X 2)  CULTURE, BLOOD (ROUTINE X 2)  LACTIC ACID, PLASMA  D-DIMER, QUANTITATIVE  PROCALCITONIN  LACTATE DEHYDROGENASE  FERRITIN  TRIGLYCERIDES  FIBRINOGEN  LACTIC ACID, PLASMA  URINALYSIS, ROUTINE W REFLEX MICROSCOPIC  C-REACTIVE PROTEIN  FERRITIN  D-DIMER, QUANTITATIVE    EKG EKG Interpretation  Date/Time:  Saturday June 29 2022 16:00:19 EDT Ventricular Rate:  72 PR Interval:  169 QRS Duration: 219 QT Interval:  483 QTC Calculation: 529 R Axis:   -64 Text Interpretation: Sinus rhythm Left bundle branch block similar to Dec 2021 Confirmed by Sherwood Gambler [phone]) on 06/29/2022 4:16:54 PM  Radiology CT Head Wo Contrast  Result Date: 06/29/2022 CLINICAL DATA:  Increased lethargy and weakness, COVID EXAM: CT HEAD WITHOUT CONTRAST TECHNIQUE: Contiguous axial images were obtained from the base of the skull through the vertex without intravenous contrast. RADIATION DOSE REDUCTION: This exam was performed according to the departmental dose-optimization program which includes automated exposure control, adjustment of the mA and/or kV according to patient size and/or use of iterative reconstruction technique. COMPARISON:  05/17/2021 FINDINGS: Brain: No evidence of acute infarction,  hemorrhage, mass, mass effect, or midline shift. No hydrocephalus or extra-axial fluid collection. Previously noted right cerebral convexity hematoma has resolved. Periventricular white matter changes, likely the sequela of chronic small vessel ischemic disease. Vascular: No hyperdense vessel. Skull: Normal. Negative for fracture or focal lesion. Sinuses/Orbits: Mild mucosal thickening in the ethmoid air cells. Status post bilateral lens replacements. Other: The mastoid air cells are well aerated. IMPRESSION: No acute intracranial process. A subdural hematoma noted on the 05/17/2021 head CT has resolved. Electronically Signed   By: Merilyn Baba M.D.   On: 06/29/2022 17:25   DG Chest Port 1 View  Result Date: 06/29/2022 CLINICAL DATA:  COVID-19 infection. Hypoxia. Increased lethargy and weakness since yesterday. Rhonchi. EXAM: PORTABLE CHEST 1 VIEW COMPARISON:  05/17/2021 FINDINGS: Normal sized heart. Aortic arch calcifications. Clear lungs with normal vascularity. Stable right subclavian bipolar pacemaker leads. Old, healed right rib fractures. IMPRESSION: No acute abnormality. Electronically Signed   By: Claudie Revering M.D.   On: 06/29/2022 15:51    Procedures Procedures    Medications Ordered in ED Medications  molnupiravir EUA (LAGEVRIO) capsule 800 mg (has no administration in time range)  sterile water (preservative free) injection (  Not Given 06/29/22 1819)  insulin aspart (novoLOG) injection 0-9 Units (2 Units Subcutaneous Given 06/29/22 2055)  acetaminophen (TYLENOL) tablet 650 mg (650 mg Oral Given 06/29/22 1555)  methylPREDNISolone sodium succinate (SOLU-MEDROL) 125 mg/2 mL injection 65 mg (65 mg Intravenous Given 06/29/22 1804)    ED Course/ Medical Decision Making/ A&P                           Medical Decision Making Amount and/or Complexity of Data Reviewed Labs: ordered.    Details: COVID test is positive.  Creatinine is stable compared to baseline.  Chem-8 shows hyperkalemia  that is likely poor data sample rather than true hyperkalemia based on the CMP.  Mild hyperglycemia. Radiology: ordered and independent interpretation performed.    Details: CT head without head bleed.  Chest  x-ray without pneumonia ECG/medicine tests: ordered and independent interpretation performed.    Details: Left bundle branch block, no acute change from baseline  Risk OTC drugs. Prescription drug management. Decision regarding hospitalization.   As above, discussed with son.  At this point, patient is mildly hypoxic with O2 sat in the low 90s.  He seems to be altered from his baseline.  He is a DNR/DNI but family does want supportive care for now.  Placed on oxygen, given Solu-Medrol given the hypoxia with COVID, and he will need admission to the hospitalist service.  Discussed with Dr. Broadus John.        Final Clinical Impression(s) / ED Diagnoses Final diagnoses:  Acute respiratory failure with hypoxia ([place])  COVID-19    Rx / DC Orders ED Discharge Orders     None         Sherwood Gambler, MD 06/29/22 2248

## 2022-06-29 NOTE — ED Notes (Signed)
Attempted x2 to draw blood and start another IV, pt hitting at nurse both times. Unable to obtain specimen at this time.

## 2022-06-29 NOTE — ED Notes (Addendum)
Patient removed vital signs monitoring equipments, not able to follow command.

## 2022-06-29 NOTE — ED Triage Notes (Signed)
BIB EMS for increased lethargy and weakness since testing + for COVID yesterday, rhonchi reported by EMS, resides at [facility], MOST form brought with pt

## 2022-06-29 NOTE — H&P (Addendum)
History and Physical    Edward Suarez [id number] DOB: 11-Jun-1932 DOA: 06/29/2022  Referring MD/NP/PA: EDP PCP:  Patient coming from: [facility]  Chief Complaint: increased confusion, agitation  HPI: Edward Suarez is a 55/G w/ H/o CAD, CHF EF 30-35%, CHB, s/p PPM, CKD3b, COPD, P.Afib on eliquis, moved to [facility] memory care unit 3 weeks ago, since the move had difficulty with confusion and agitation for few weeks and then seemed to settle down on psychotropic meds. Facility reported increased agitation and confusion, fall in last 2 days, he tested positive for Covid yesterday, today he was more lethargic, sent to the ED. -Patient is unable to provide any history, drowsy opens eyes, mumbles and confused -Called and discussed with son, who lives in [place],  patient also has a DNR and MOST form which notes plan for comfort measures only however patient's son would like conservative management with fluids and antibiotics as needed  ED Course: Afebrile, O2 sats 92-94% on RA, chest x-ray was clear, creatinine 1.6  Review of Systems: As per HPI otherwise 14 point review of systems negative.   Past Medical History:  Diagnosis Date   Allergic rhinitis, cause unspecified    BPH (benign prostatic hyperplasia)    Carotid artery stenosis, asymptomatic    1-39% by dopplers 09/2016 followed by Dr. Oneida Alar   Cataracts, bilateral    Chronic kidney disease (CKD), stage III (moderate) ([place])    Complete heart block ([place]) 01/23/04   s/p Medtronic PPM implanted by Dr Leonia Reeves   COPD (chronic obstructive pulmonary disease) ([place])    Diabetes mellitus    Glaucoma    Hyperlipidemia    Hypertension    Insomnia due to medical condition 08/10/2014   Kidney stones    PAF (paroxysmal atrial fibrillation) ([place]) 10/09/2014   Noted on pacer check with 88 mode switches and longest episode >1 hour.  Now on Apixiban for [place] score of 5    Past Surgical History:  Procedure Laterality Date    PACEMAKER INSERTION  2005, 04/16/12   MDT implanted by Dr Leonia Reeves with generator chnage (MDT Adapta L) by Dr Rayann Heman 04/16/12   PERMANENT PACEMAKER GENERATOR CHANGE N/A 04/16/2012   Procedure: PERMANENT PACEMAKER GENERATOR CHANGE;  Surgeon: Thompson Grayer, MD;  Location: [facility] CATH LAB;  Service: Cardiovascular;  Laterality: N/A;   RIGHT/LEFT HEART CATH AND CORONARY ANGIOGRAPHY N/A 10/18/2020   Procedure: RIGHT/LEFT HEART CATH AND CORONARY ANGIOGRAPHY;  Surgeon: Troy Sine, MD;  Location: [facility] CV LAB;  Service: Cardiovascular;  Laterality: N/A;     reports that he quit smoking about 23 years ago. His smoking use included cigarettes. He has a 25.00 pack-year smoking history. He has never used smokeless tobacco. He reports that he does not currently use alcohol. He reports that he does not use drugs.  Allergies  Allergen Reactions   Penicillins Rash    Reaction: Childhood   Penicillin G Benzathine Rash   Penicillins Rash    Family History  Problem Relation Age of Onset   Heart disease Father    Heart attack Father    Alzheimer's disease Sister      Prior to Admission medications   Medication Sig Start Date End Date Taking? Authorizing Provider  apixaban (ELIQUIS) 2.5 MG TABS tablet TAKE ONE TABLET BY MOUTH TWICE DAILY 06/27/22   Sueanne Margarita, MD  atorvastatin (LIPITOR) 80 MG tablet TAKE ONE TABLET BY MOUTH EVERY EVENING 12/26/21   Sueanne Margarita, MD  Calcium Carb-Cholecalciferol (  CALCIUM 600 + D PO) Take 1 tablet by mouth daily. 10 mcg vit d    [provider]  cetirizine (ZYRTEC) 10 MG tablet Take 10 mg by mouth daily as needed for allergies. For allergies. Takes prn    [provider]  dapagliflozin propanediol (FARXIGA) 10 MG TABS tablet Take 1 tablet (10 mg total) by mouth daily. 12/27/21   Sueanne Margarita, MD  divalproex (DEPAKOTE) 125 MG DR tablet Take 2 tablets (250 mg total) by mouth 2 (two) times daily. 06/26/22   Medina-Vargas, Monina C, NP  donepezil  (ARICEPT) 10 MG tablet TAKE ONE TABLET BY MOUTH EVERY EVENING 05/30/22   Ward Givens, NP  fluticasone (FLONASE) 50 MCG/ACT nasal spray Place 2 sprays into both nostrils daily as needed for allergies or rhinitis.    [provider]  isosorbide mononitrate (IMDUR) 30 MG 24 hr tablet TAKE 1/2 TABLET BY MOUTH EVERY MORNING 03/28/22   Sueanne Margarita, MD  loratadine (CLARITIN) 10 MG tablet Take 10 mg by mouth daily as needed for allergies.    [provider]  LORazepam (ATIVAN) 2 MG/ML injection Inject 1 mL (2 mg total) into the muscle every 8 (eight) hours. Give either topically or IM but cannot be given together. 06/26/22   Medina-Vargas, Monina C, NP  LORazepam (LORAZEPAM INTENSOL) 2 MG/ML concentrated solution 1mg /ml apply 2 mg gel to skin every 8 hours as needed. Give either topically or IM but cannot be given together. 06/26/22   Medina-Vargas, Monina C, NP  memantine (NAMENDA) 10 MG tablet Take 1 tablet (10 mg total) by mouth 2 (two) times daily. 02/17/19   Ward Givens, NP  nitroGLYCERIN (NITROSTAT) 0.4 MG SL tablet Place 1 tablet (0.4 mg total) under the tongue every 5 (five) minutes as needed for chest pain. 09/04/20   Swayze, Ava, DO  prednisoLONE acetate (PRED FORTE) 1 % ophthalmic suspension Place 1 drop into both eyes as needed. 11/10/20   [provider]  risperiDONE (RISPERDAL) 0.25 MG tablet Take 1 tablet (0.25 mg total) by mouth 2 (two) times daily. 06/26/22   Medina-Vargas, Monina C, NP  SYNTHROID 75 MCG tablet Take 75 mcg by mouth daily. 03/13/21   [provider]  valsartan (DIOVAN) 40 MG tablet Take 1 tablet (40 mg total) by mouth daily. 08/07/21   Sueanne Margarita, MD    Physical Exam: Vitals:   06/29/22 1615 06/29/22 1630 06/29/22 1645 06/29/22 1700  BP: 139/79 136/62 (!) 108/47 (!) 122/58  Pulse: 74 70 70 70  Resp: 19 17 17 15   Temp:      TempSrc:      SpO2: 99% 97% 94% 95%  Weight:      Height:        Elderly chronically ill male  laying in bed, somnolent, opens eyes, mumbles few words, confused HEENT: No JVD CVS: S1-S2, regular rhythm Lungs: Poor air movement bilaterally Abdomen: Soft, nontender, bowel sounds present Extremities: No edema Neuro: No localizing signs, poor effort, limited exam Psych:  unable to assess  Labs on Admission: I have personally reviewed following labs and imaging studies  CBC: Recent Labs  Lab 06/29/22 1545 06/29/22 1605  WBC 6.4  --   NEUTROABS 4.8  --   HGB 14.4 9.9*  HCT 44.5 29.0*  MCV 82.9  --   PLT 222  --    Basic Metabolic Panel: Recent Labs  Lab 06/29/22 1545 06/29/22 1605  NA 144 146*  K 4.3 6.9*  CL 110  --  CO2 24  --   GLUCOSE 171*  --   BUN 21  --   CREATININE 1.68*  --   CALCIUM 9.1  --    GFR: Estimated Creatinine Clearance: 26.9 mL/min (A) (by C-G formula based on SCr of 1.68 mg/dL (H)). Liver Function Tests: Recent Labs  Lab 06/29/22 1545  AST 25  ALT 29  ALKPHOS 77  BILITOT 0.5  PROT 6.2*  ALBUMIN 3.6   No results for input(s): "LIPASE", "AMYLASE" in the last 168 hours. No results for input(s): "AMMONIA" in the last 168 hours. Coagulation Profile: No results for input(s): "INR", "PROTIME" in the last 168 hours. Cardiac Enzymes: No results for input(s): "CKTOTAL", "CKMB", "CKMBINDEX", "TROPONINI" in the last 168 hours. BNP (last 3 results) No results for input(s): "PROBNP" in the last 8760 hours. HbA1C: No results for input(s): "HGBA1C" in the last 72 hours. CBG: Recent Labs  Lab 06/29/22 1558  GLUCAP 240*   Lipid Profile: Recent Labs    06/29/22 1545  TRIG 97   Thyroid Function Tests: No results for input(s): "TSH", "T4TOTAL", "FREET4", "T3FREE", "THYROIDAB" in the last 72 hours. Anemia Panel: Recent Labs    06/29/22 1545  FERRITIN 101   Urine analysis:    Component Value Date/Time   COLORURINE STRAW (A) 09/02/2018 0151   APPEARANCEUR CLEAR 09/02/2018 0151   LABSPEC 1.009 09/02/2018 0151   PHURINE 5.0 09/02/2018  0151   GLUCOSEU NEGATIVE 09/02/2018 0151   HGBUR NEGATIVE 09/02/2018 0151   BILIRUBINUR NEGATIVE 09/02/2018 0151   KETONESUR NEGATIVE 09/02/2018 0151   PROTEINUR NEGATIVE 09/02/2018 0151   NITRITE NEGATIVE 09/02/2018 0151   LEUKOCYTESUR NEGATIVE 09/02/2018 0151   Sepsis Labs: @LABRCNTIP (procalcitonin:4,lacticidven:4) )No results found for this or any previous visit (from the past 240 hour(s)).   Radiological Exams on Admission: CT Head Wo Contrast  Result Date: 06/29/2022 CLINICAL DATA:  Increased lethargy and weakness, COVID EXAM: CT HEAD WITHOUT CONTRAST TECHNIQUE: Contiguous axial images were obtained from the base of the skull through the vertex without intravenous contrast. RADIATION DOSE REDUCTION: This exam was performed according to the departmental dose-optimization program which includes automated exposure control, adjustment of the mA and/or kV according to patient size and/or use of iterative reconstruction technique. COMPARISON:  05/17/2021 FINDINGS: Brain: No evidence of acute infarction, hemorrhage, mass, mass effect, or midline shift. No hydrocephalus or extra-axial fluid collection. Previously noted right cerebral convexity hematoma has resolved. Periventricular white matter changes, likely the sequela of chronic small vessel ischemic disease. Vascular: No hyperdense vessel. Skull: Normal. Negative for fracture or focal lesion. Sinuses/Orbits: Mild mucosal thickening in the ethmoid air cells. Status post bilateral lens replacements. Other: The mastoid air cells are well aerated. IMPRESSION: No acute intracranial process. A subdural hematoma noted on the 05/17/2021 head CT has resolved. Electronically Signed   By: Merilyn Baba M.D.   On: 06/29/2022 17:25   DG Chest Port 1 View  Result Date: 06/29/2022 CLINICAL DATA:  COVID-19 infection. Hypoxia. Increased lethargy and weakness since yesterday. Rhonchi. EXAM: PORTABLE CHEST 1 VIEW COMPARISON:  05/17/2021 FINDINGS: Normal sized  heart. Aortic arch calcifications. Clear lungs with normal vascularity. Stable right subclavian bipolar pacemaker leads. Old, healed right rib fractures. IMPRESSION: No acute abnormality. Electronically Signed   By: Claudie Revering M.D.   On: 06/29/2022 15:51    EKG: Independently reviewed. LBBB and Prolonged QTc  Assessment/Plan  Acute encephalopathy COVID-19 virus infection -Suspect his encephalopathy is likely secondary to active COVID-19 infection -No evidence of hypoxia or pneumonia on imaging  at this time -Add molnupiravir, IV fluids today -Airborne precautions, check CRP/ferritin and D-dimer in a.m. -N.p.o. except meds today, resume diet tomorrow if mental status is improving -Called and updated son who wishes for gently conservative management at this time (fluids, antibiotics/antivirals etc.), he is DNR -Check UA for completeness -At risk for delirium  Chronic systolic CHF, prior echo with EF 30-35% -Clinically appears dry at this time, hold Farxiga and valsartan at this time -Gentle IV fluids today  CKD 3a -Stable at baseline, hold valsartan  Hyperkalemia -Initial BMP today with potassium of 4.3, repeat K in the ED at 6.9, suspect hemolyzed specimen -Will repeat, hold valsartan  Paroxysmal atrial fibrillation History of complete heart block, s/p PPM -In sinus rhythm at this time, continue apixaban  History of dementia with behavioral disturbances -Continue Aricept and Namenda -Hold Ativan -At risk of increased agitation and fall delirium in this setting -Resume risperidone, dose increased 0.5 mg twice daily -Continue Depakote sprinkles -QTc is prolonged, check EKG in a.m.  Type 2 diabetes mellitus -Hold Farxiga, sliding scale insulin for now    DVT prophylaxis: Apixaban Code Status: DNR Family Communication: no family at bedside, called and updated son Edward Suarez [phone] Disposition Plan: Back to [facility] memory care when stable Consults called: None   Admission status: Inpatient  Domenic Polite MD [facility]   06/29/2022, 5:49 PM

## 2022-06-29 NOTE — ED Notes (Signed)
Pt up out of bed, removed all monitoring equipment and gown. RN x2 at bedside to get pt back to bed. Blinds open and both side rails up for safety.

## 2022-06-29 NOTE — ED Notes (Addendum)
PT found standing at head of bed. PT had unplugged the call bell, and was attempting to remove the suction tubing from the canister. PT able to be verbally redirected back into bed. Call light plugged back into the wall, and new blanket applied to PT.

## 2022-06-29 NOTE — Telephone Encounter (Signed)
Nurse called to request order to send resident to the ED. He is weak and fell and hit his head and is more lethargic. He also has small pupils and was having low sats and covid swab returned positive. Order given to send to the ED.

## 2022-06-30 DIAGNOSIS — E0849 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: Secondary | ICD-10-CM

## 2022-06-30 DIAGNOSIS — I480 Paroxysmal atrial fibrillation: Secondary | ICD-10-CM

## 2022-06-30 DIAGNOSIS — J9601 Acute respiratory failure with hypoxia: Secondary | ICD-10-CM

## 2022-06-30 DIAGNOSIS — G9340 Encephalopathy, unspecified: Secondary | ICD-10-CM | POA: Diagnosis not present

## 2022-06-30 DIAGNOSIS — I5020 Unspecified systolic (congestive) heart failure: Secondary | ICD-10-CM

## 2022-06-30 DIAGNOSIS — U071 COVID-19: Secondary | ICD-10-CM | POA: Diagnosis not present

## 2022-06-30 DIAGNOSIS — Z7189 Other specified counseling: Secondary | ICD-10-CM

## 2022-06-30 DIAGNOSIS — G301 Alzheimer's disease with late onset: Secondary | ICD-10-CM

## 2022-06-30 DIAGNOSIS — F02818 Dementia in other diseases classified elsewhere, unspecified severity, with other behavioral disturbance: Secondary | ICD-10-CM

## 2022-06-30 LAB — C-REACTIVE PROTEIN: CRP: 3.2 mg/dL — ABNORMAL HIGH (ref <1.0)

## 2022-06-30 LAB — CBG MONITORING, ED
Glucose-Capillary: 152 mg/dL — ABNORMAL HIGH (ref 70–99)
Glucose-Capillary: 176 mg/dL — ABNORMAL HIGH (ref 70–99)
Glucose-Capillary: 187 mg/dL — ABNORMAL HIGH (ref 70–99)
Glucose-Capillary: 263 mg/dL — ABNORMAL HIGH (ref 70–99)

## 2022-06-30 LAB — GLUCOSE, CAPILLARY
Glucose-Capillary: 189 mg/dL — ABNORMAL HIGH (ref 70–99)
Glucose-Capillary: 122 mg/dL — ABNORMAL HIGH (ref 70–99)

## 2022-06-30 LAB — D-DIMER, QUANTITATIVE: D-Dimer, Quant: 0.38 ug/mL-FEU (ref 0.00–0.50)

## 2022-06-30 LAB — FERRITIN: Ferritin: 121 ng/mL (ref 24–336)

## 2022-06-30 MED ORDER — MELATONIN 5 MG PO TABS
5.0000 mg | ORAL_TABLET | Freq: Once | ORAL | Status: AC
  Administered 2022-06-30: 5 mg via ORAL
  Filled 2022-06-30: qty 1

## 2022-06-30 MED ORDER — ORAL CARE MOUTH RINSE: 15.0000 mL | OROMUCOSAL | Status: DC | PRN

## 2022-06-30 MED ORDER — GERHARDT'S BUTT CREAM
TOPICAL_CREAM | Freq: Two times a day (BID) | CUTANEOUS | Status: DC
  Administered 2022-07-01: 1 via TOPICAL
  Filled 2022-06-30 (×2): qty 1

## 2022-06-30 NOTE — ED Notes (Signed)
Notified EDT of need to transport pt up to 6N

## 2022-06-30 NOTE — ED Notes (Signed)
Mitts placed on pt

## 2022-06-30 NOTE — TOC Initial Note (Signed)
Transition of Care [facility]) - Initial/Assessment Note    Patient Details  Name: Edward Suarez MRN: [id number] Date of Birth: 04-30-1932  Transition of Care [facility]) CM/SW Contact:    Coralee Pesa, [place] Phone Number: 06/30/2022, 11:04 AM  Clinical Narrative:                 CSW spoke with [facility] who confirmed pt is from their memory care. CSW left VM for pt's nurse. CSW followed up with wife, Edward Suarez, who advised pt has been at facility for about a month, and the plan is for him to return. Spouse states pt has been fairly independent physically, but notes his dementia can make him difficult. CSW answered all questions and spouse noted appreciation. She asked CSW if her son could be updated as well.  CSW spoke with [facility], and updated him. [name] states he is on his way to visit pt. He states if pt is physically able, son can transport him back to facility. TOC will follow to assist with DC.  Expected Discharge Plan: Memory Care Barriers to Discharge: Continued Medical Work up, Requiring sitter/restraints   Patient Goals and CMS Choice Patient states their goals for this hospitalization and ongoing recovery are:: Pt is disoriented and unable to participate in goal setting. CMS Medicare.gov Compare Post Acute Care list provided to:: Patient Represenative (must comment) Choice offered to / list presented to : Spouse, Adult Children  Expected Discharge Plan and Services Expected Discharge Plan: Memory Care     Post Acute Care Choice: Resumption of Svcs/PTA Provider Living arrangements for the past 2 months:  (Memory Care, [place])                                      Prior Living Arrangements/Services Living arrangements for the past 2 months:  (Memory Care, [place]) Lives with:: Facility Resident Patient language and need for interpreter reviewed:: Yes Do you feel safe going back to the place where you live?: Yes      Need for Family Participation in  Patient Care: Yes (Comment) Care giver support system in place?: Yes (comment)   Criminal Activity/Legal Involvement Pertinent to Current Situation/Hospitalization: No - Comment as needed  Activities of Daily Living      Permission Sought/Granted Permission sought to share information with : Family Supports, Chartered certified accountant granted to share information with : Yes, Verbal Permission Granted  Share Information with NAME: Edward Suarez  Permission granted to share info w AGENCY: [place] granted to share info w Relationship: Spouse     Emotional Assessment Appearance:: Appears stated age Attitude/Demeanor/Rapport: Unable to Assess Affect (typically observed): Unable to Assess Orientation: : Oriented to Self Alcohol / Substance Use: Not Applicable Psych Involvement: No (comment)  Admission diagnosis:  Encephalopathy [G93.40] Patient Active Problem List   Diagnosis Date Noted   Acute encephalopathy 06/29/2022   COVID-19 virus infection 06/29/2022   Encephalopathy 06/29/2022   Dysphagia 06/24/2022   Hypothyroidism 06/07/2022   Late onset Alzheimer's dementia with behavioral disturbance ([place]) 03/22/2021   DCM (dilated cardiomyopathy) ([place])    HFrEF (heart failure with reduced ejection fraction) ([place]) 10/11/2020   Chest pain of uncertain etiology 25/36/6440   Dementia arising in the senium and presenium ([place]) 11/19/2017   PAF (paroxysmal atrial fibrillation) ([place]) 11/06/2016   Amnestic MCI (mild cognitive impairment with memory loss) 10/19/2015  Cervicalgia 10/19/2015   Cardiac pacemaker in situ 10/19/2015   Chronic renal insufficiency, stage III (moderate) (HCC) 11/21/2014   Insomnia due to medical condition 08/10/2014   Allergic rhinitis 08/10/2014   Hypercholesterolemia without hypertriglyceridemia 08/10/2014   Snoring 06/15/2014   Memory loss, short term 06/15/2014   MCI (mild cognitive impairment) with memory loss 06/15/2014    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 ([place]) 0000000   PVC (premature ventricular contraction) 05/05/2014   Diabetic kidney ([place]) 05/04/2014   Amnesia 05/04/2014   Pacemaker-Medtronic 04/17/2012   Hypertension 04/15/2012   Atrioventricular block, complete ([place]) 04/13/2012   Carotid stenosis, bilateral 02/07/2012   PCP:  Mast, Man X, NP Pharmacy:   [facility] - [place], [place] - [address] [place] [address] Phone: [phone] Fax: [phone]  [facility] - [place], [place] - [address] [place] [address] Phone: [phone] Fax: [phone]  [facility] - [place], [place] [address] [place] [address] Phone: [phone] Fax: [place], [place] [address] [place] [address] Phone: [phone] Fax: [phone]     Social Determinants of Health (SDOH) Interventions    Readmission Risk Interventions     No data to display

## 2022-06-30 NOTE — ED Notes (Signed)
Pt was moved OTF and admitted to 6N prior to pt physically being moved upstairs. Pt remains in ED room 4. CN notified.

## 2022-06-30 NOTE — ED Notes (Signed)
Upon entering room RN noticed pt walking around room picking at IV and removing monitoring cords. RN removed cords and redirected pt to bed. Pt was hard to redirect.  Sitter and NT arrived and assisted with get pt back in bed. Pt complied and has closed his eyes.

## 2022-06-30 NOTE — ED Notes (Signed)
Patient denies pain and is resting comfortably.  

## 2022-06-30 NOTE — Progress Notes (Addendum)
PROGRESS NOTE    Edward Suarez  OEU:[id number] DOB: September 26, 1931 DOA: 06/29/2022 PCP: Mast, Man X, NP   Edward Suarez is a 17/Q w/ H/o CAD, CHF EF 30-35%, CHB, s/p PPM, CKD3b, COPD, P.Afib on eliquis, moved to [facility] memory care unit 3 weeks ago brought to the ED with increased lethargy and confusion, tested positive for COVID on 10/13, in the emergency room was afebrile, sats of 92 to 94% on room air, chest x-ray was clear, creatinine was 1.6  Subjective: -Confused overnight, required a sitter  Assessment and Plan:  Acute encephalopathy COVID-19 virus infection -Suspect his encephalopathy is likely secondary to active COVID-19 infection -No evidence of hypoxia or pneumonia on imaging at this time -Given gentle IV fluids yesterday, started on molnupiravir, continue day 2/5 -Airborne precautions, trend CRP/ferritin and D-dimer -Add D3 diet today, check SLP evaluation -Discussed with son yesterday  who wishes for gentle conservative management at this time (fluids, antibiotics/antivirals etc.), he is DNR -UA unable to be obtained yesterday, pending -At risk for delirium -Mental status improving, PT OT eval   Chronic systolic CHF, prior echo with EF 30-35% -Clinically appears dry at this time, hold Farxiga and valsartan at this time -DC IV fluids today   CKD 3a -Stable at baseline, hold valsartan   Hyperkalemia -Likely hemolyzed specimen, resolved   Paroxysmal atrial fibrillation History of complete heart block, s/p PPM -In sinus rhythm at this time, continue apixaban   History of dementia with behavioral disturbances -Continue Aricept and Namenda -Hold Ativan -At risk of increased agitation and delirium in this setting -Resume risperidone, dose increased 0.5 mg twice daily -Continue Depakote sprinkles -QTc is prolonged, check EKG in a.m. -Palliative consult requested for GoC   Type 2 diabetes mellitus -Hold Farxiga, sliding scale insulin for now    DVT  prophylaxis: Apixaban Code Status: DNR Family Communication: no family at bedside, called and updated son [name] [phone] yesterday, updated wife today Disposition Plan: Back to [facility] memory care when stable  = Consultants:    Procedures:   Antimicrobials:    Objective: Vitals:   06/30/22 0437 06/30/22 0801 06/30/22 0810 06/30/22 0834  BP: 125/71 (!) 140/64    Pulse: 70 72 73 70  Resp: 16 17 17 14   Temp:    98.1 F (36.7 C)  TempSrc:    Oral  SpO2: 97% 97% 93% 96%  Weight:      Height:       No intake or output data in the 24 hours ending 06/30/22 1001 Filed Weights   06/29/22 1434  Weight: 62.6 kg    Examination:  General exam: Awake and alert, calm this morning, pleasantly confused Respiratory system: Deep breath sounds the bases otherwise clear Cardiovascular system: S1 & S2 heard, RRR.  Abd: nondistended, soft and nontender.Normal bowel sounds heard. Central nervous system: Can alert, moves all extremities, no localizing signs, disoriented Extremities: no edema Skin: No rashes Psychiatry: Poor insight and judgment    Data Reviewed:   CBC: Recent Labs  Lab 06/29/22 1545 06/29/22 1605  WBC 6.4  --   NEUTROABS 4.8  --   HGB 14.4 9.9*  HCT 44.5 29.0*  MCV 82.9  --   PLT 222  --    Basic Metabolic Panel: Recent Labs  Lab 06/29/22 1545 06/29/22 1605 06/29/22 2006  NA 144 146* 143  K 4.3 6.9* 4.5  CL 110  --  111  CO2 24  --  21*  GLUCOSE 171*  --  209*  BUN 21  --  22  CREATININE 1.68*  --  1.67*  CALCIUM 9.1  --  8.8*   GFR: Estimated Creatinine Clearance: 27.1 mL/min (A) (by C-G formula based on SCr of 1.67 mg/dL (H)). Liver Function Tests: Recent Labs  Lab 06/29/22 1545  AST 25  ALT 29  ALKPHOS 77  BILITOT 0.5  PROT 6.2*  ALBUMIN 3.6   No results for input(s): "LIPASE", "AMYLASE" in the last 168 hours. No results for input(s): "AMMONIA" in the last 168 hours. Coagulation Profile: No results for input(s): "INR",  "PROTIME" in the last 168 hours. Cardiac Enzymes: No results for input(s): "CKTOTAL", "CKMB", "CKMBINDEX", "TROPONINI" in the last 168 hours. BNP (last 3 results) No results for input(s): "PROBNP" in the last 8760 hours. HbA1C: Recent Labs    06/29/22 2002  HGBA1C 9.5*   CBG: Recent Labs  Lab 06/29/22 1558 06/29/22 2005 06/29/22 2358 06/30/22 0501 06/30/22 0827  GLUCAP 240* 188* 263* 187* 176*   Lipid Profile: Recent Labs    06/29/22 1545  TRIG 97   Thyroid Function Tests: No results for input(s): "TSH", "T4TOTAL", "FREET4", "T3FREE", "THYROIDAB" in the last 72 hours. Anemia Panel: Recent Labs    06/29/22 1545 06/30/22 0437  FERRITIN 101 121   Urine analysis:    Component Value Date/Time   COLORURINE STRAW (A) 09/02/2018 0151   APPEARANCEUR CLEAR 09/02/2018 0151   LABSPEC 1.009 09/02/2018 0151   PHURINE 5.0 09/02/2018 0151   GLUCOSEU NEGATIVE 09/02/2018 0151   HGBUR NEGATIVE 09/02/2018 0151   BILIRUBINUR NEGATIVE 09/02/2018 0151   KETONESUR NEGATIVE 09/02/2018 0151   PROTEINUR NEGATIVE 09/02/2018 0151   NITRITE NEGATIVE 09/02/2018 0151   LEUKOCYTESUR NEGATIVE 09/02/2018 0151   Sepsis Labs: @LABRCNTIP (procalcitonin:4,lacticidven:4)  ) Recent Results (from the past 240 hour(s))  SARS Coronavirus 2 by RT PCR (hospital order, performed in [facility] lab) *cepheid single result test* Peripheral     Status: Abnormal   Collection Time: 06/29/22  4:00 PM   Specimen: Peripheral; Nasal Swab  Result Value Ref Range Status   SARS Coronavirus 2 by RT PCR POSITIVE (A) NEGATIVE Final    Comment: (NOTE) SARS-CoV-2 target nucleic acids are DETECTED  SARS-CoV-2 RNA is generally detectable in upper respiratory specimens  during the acute phase of infection.  Positive results are indicative  of the presence of the identified virus, but do not rule out bacterial infection or co-infection with other pathogens not detected by the test.  Clinical correlation with  patient history and  other diagnostic information is necessary to determine patient infection status.  The expected result is negative.  Fact Sheet for Patients:   07/01/22   Fact Sheet for Healthcare Providers:   RoadLapTop.co.za    This test is not yet approved or cleared by the http://kim-miller.com/ FDA and  has been authorized for detection and/or diagnosis of SARS-CoV-2 by FDA under an Emergency Use Authorization (EUA).  This EUA will remain in effect (meaning this test can be used) for the duration of  the COVID-19 declaration under Section 564(b)(1)  of the Act, 21 U.S.C. section 360-bbb-3(b)(1), unless the authorization is terminated or revoked sooner.   Performed at [facility], [address], [place], [place]      Radiology Studies: CT Head Wo Contrast  Result Date: 06/29/2022 CLINICAL DATA:  Increased lethargy and weakness, COVID EXAM: CT HEAD WITHOUT CONTRAST TECHNIQUE: Contiguous axial images were obtained from the base of the skull through the vertex  without intravenous contrast. RADIATION DOSE REDUCTION: This exam was performed according to the departmental dose-optimization program which includes automated exposure control, adjustment of the mA and/or kV according to patient size and/or use of iterative reconstruction technique. COMPARISON:  05/17/2021 FINDINGS: Brain: No evidence of acute infarction, hemorrhage, mass, mass effect, or midline shift. No hydrocephalus or extra-axial fluid collection. Previously noted right cerebral convexity hematoma has resolved. Periventricular white matter changes, likely the sequela of chronic small vessel ischemic disease. Vascular: No hyperdense vessel. Skull: Normal. Negative for fracture or focal lesion. Sinuses/Orbits: Mild mucosal thickening in the ethmoid air cells. Status post bilateral lens replacements. Other: The mastoid air cells are well aerated. IMPRESSION:  No acute intracranial process. A subdural hematoma noted on the 05/17/2021 head CT has resolved. Electronically Signed   By: Wiliam Ke M.D.   On: 06/29/2022 17:25   DG Chest Port 1 View  Result Date: 06/29/2022 CLINICAL DATA:  COVID-19 infection. Hypoxia. Increased lethargy and weakness since yesterday. Rhonchi. EXAM: PORTABLE CHEST 1 VIEW COMPARISON:  05/17/2021 FINDINGS: Normal sized heart. Aortic arch calcifications. Clear lungs with normal vascularity. Stable right subclavian bipolar pacemaker leads. Old, healed right rib fractures. IMPRESSION: No acute abnormality. Electronically Signed   By: Beckie Salts M.D.   On: 06/29/2022 15:51     Scheduled Meds:  apixaban  2.5 mg Oral BID   atorvastatin  80 mg Oral QPM   divalproex  250 mg Oral BID   donepezil  10 mg Oral QPM   insulin aspart  0-9 Units Subcutaneous Q4H   levothyroxine  75 mcg Oral Daily   memantine  10 mg Oral BID   molnupiravir EUA  4 capsule Oral BID   risperiDONE  0.25 mg Oral BID   Continuous Infusions:  sodium chloride 75 mL/hr at 06/29/22 2355     LOS: 1 day    Time spent:    Zannie Cove, MD [facility]   06/30/2022, 10:01 AM

## 2022-06-30 NOTE — ED Notes (Signed)
Called AC for sitter at 1100. Per [place] she will see what she can do.

## 2022-06-30 NOTE — Evaluation (Signed)
Clinical/Bedside Swallow Evaluation Patient Details  Name: Edward Suarez MRN: [id number] Date of Birth: [birth year]  Today's Date: 06/30/2022 Time: SLP Start Time (ACUTE ONLY): 73 SLP Stop Time (ACUTE ONLY): 1225 SLP Time Calculation (min) (ACUTE ONLY): 20 min  Past Medical History:  Past Medical History:  Diagnosis Date   Allergic rhinitis, cause unspecified    BPH (benign prostatic hyperplasia)    Carotid artery stenosis, asymptomatic    1-39% by dopplers 09/2016 followed by Dr. Oneida Alar   Cataracts, bilateral    Chronic kidney disease (CKD), stage III (moderate) ([place])    Complete heart block ([place]) 01/23/04   s/p Medtronic PPM implanted by Dr Leonia Reeves   COPD (chronic obstructive pulmonary disease) ([place])    Diabetes mellitus    Glaucoma    Hyperlipidemia    Hypertension    Insomnia due to medical condition 08/10/2014   Kidney stones    PAF (paroxysmal atrial fibrillation) ([place]) 10/09/2014   Noted on pacer check with 88 mode switches and longest episode >1 hour.  Now on Apixiban for Morton score of 5   Past Surgical History:  Past Surgical History:  Procedure Laterality Date   PACEMAKER INSERTION  2005, 04/16/12   MDT implanted by Dr Leonia Reeves with generator chnage (MDT Adapta L) by Dr Rayann Heman 04/16/12   PERMANENT PACEMAKER GENERATOR CHANGE N/A 04/16/2012   Procedure: PERMANENT PACEMAKER GENERATOR CHANGE;  Surgeon: Thompson Grayer, MD;  Location: [facility] CATH LAB;  Service: Cardiovascular;  Laterality: N/A;   RIGHT/LEFT HEART CATH AND CORONARY ANGIOGRAPHY N/A 10/18/2020   Procedure: RIGHT/LEFT HEART CATH AND CORONARY ANGIOGRAPHY;  Surgeon: Troy Sine, MD;  Location: [facility] CV LAB;  Service: Cardiovascular;  Laterality: N/A;   HPI:  Patient is an 88 y.o. male with PMH: CAD, CHF, COPD, CKD IIIb, a-fib, dementia. He resides at [facility]. He was admitted to the hospital on 06/29/22 with increased lethargy and confusion, positive for COVID-19 on 10/13, was afebrile  in ER, oxygen saturations 92-94% on RA, CXR was clear. He was admitted with acute encephalopathy.    Assessment / Plan / Recommendation  Clinical Impression  Patient presenting with what appears to be a cognitive-based dysphagia. No overt s/s of aspiration or penetration observed during or after the swallow with thin liquids (water) via straw sips) and small amount of saltine cracker. Swallow initiation with liquids appeared timely and although mastication of cracker was mildly delayed, no oral residuals post swallows. Per RN, report was that patient had some difficulty with pills but he was apparently spitting out pills as opposed to exhibiting overt s/s aspiration or penetration. SLP recommending continue with Dys 3(mechanical soft) solids and thin liquids, pills as tolerated (crushed or whole in puree). SLP will plan to f/u at least one more time to ensure diet toleration. SLP Visit Diagnosis: Dysphagia, unspecified (R13.10)    Aspiration Risk  Mild aspiration risk    Diet Recommendation Dysphagia 3 (Mech soft);Thin liquid   Liquid Administration via: Cup;Straw Medication Administration: Whole meds with puree Supervision: Patient able to self feed;Full supervision/cueing for compensatory strategies Compensations: Slow rate;Small sips/bites;Minimize environmental distractions Postural Changes: Seated upright at 90 degrees    Other  Recommendations Oral Care Recommendations: Oral care BID;Staff/trained caregiver to provide oral care    Recommendations for follow up therapy are one component of a multi-disciplinary discharge planning process, led by the attending physician.  Recommendations may be updated based on patient status, additional functional criteria and insurance authorization.  Follow up Recommendations  Follow physician's recommendations for discharge plan and follow up therapies      Assistance Recommended at Discharge Frequent or constant Supervision/Assistance  Functional  Status Assessment Patient has had a recent decline in their functional status and demonstrates the ability to make significant improvements in function in a reasonable and predictable amount of time.  Frequency and Duration min 1 x/week  1 week       Prognosis Prognosis for Safe Diet Advancement: Fair Barriers to Reach Goals: Cognitive deficits;Time post onset      Swallow Study   General Date of Onset: 06/29/22 HPI: Patient is an 88 y.o. male with PMH: CAD, CHF, COPD, CKD IIIb, a-fib, dementia. He resides at [facility]. He was admitted to the hospital on 06/29/22 with increased lethargy and confusion, positive for COVID-19 on 10/13, was afebrile in ER, oxygen saturations 92-94% on RA, CXR was clear. He was admitted with acute encephalopathy. Type of Study: Bedside Swallow Evaluation Previous Swallow Assessment: none found Diet Prior to this Study: Dysphagia 3 (soft);Thin liquids Temperature Spikes Noted: No Respiratory Status: Room air History of Recent Intubation: No Behavior/Cognition: Alert;Confused;Agitated;Distractible;Requires cueing;Cooperative Oral Cavity Assessment: Within Functional Limits Oral Care Completed by SLP: Yes Oral Cavity - Dentition: Adequate natural dentition Vision: Functional for self-feeding Self-Feeding Abilities: Needs assist;Needs set up Patient Positioning: Upright in bed Baseline Vocal Quality: Normal Volitional Cough: Cognitively unable to elicit Volitional Swallow: Unable to elicit    Oral/Motor/Sensory Function Overall Oral Motor/Sensory Function: Other (comment) (patient did not participate in oral motor exam but appears WFL overall)   Ice Chips     Thin Liquid Thin Liquid: Within functional limits Presentation: Straw    Nectar Thick     Honey Thick     Puree Puree: Not tested   Solid     Solid: Impaired Oral Phase Impairments: Impaired mastication Oral Phase Functional Implications: Prolonged oral transit;Impaired  mastication     Sonia Baller, MA, CCC-SLP Speech Therapy

## 2022-06-30 NOTE — Consult Note (Signed)
Consultation Note Date: 06/30/2022   Patient Name: Edward Suarez  DOB: [birth year]  MRN: [id number]  Age / Sex: 88 y.o., male  PCP: Edward Suarez Referring Physician: Zannie Cove, Suarez  Reason for Consultation: Establishing goals of care  HPI/Patient Profile: 88 y.o. male  with past medical history of CAD, CHF EF 30-35%, CHB, s/p PPM, CKD3b, COPD, P.Afib on eliquis admitted on 06/29/2022 with lethargy, fall, worsening confusion.   [facility]  Per documents at arrival, goals are for comfort care however patient's son is now interested in conservative management. PMT has been consulted to assist with goals of care conversation.  Clinical Assessment and Goals of Care:  I have reviewed medical records including EPIC notes, labs and imaging, received report from RN, assessed the patient and then had a phone conversation with patient's son and wife to discuss diagnosis prognosis, GOC, EOL wishes, disposition and options.  I introduced Palliative Medicine as specialized medical care for people living with serious illness. It focuses on providing relief from the symptoms and stress of a serious illness. The goal is to improve quality of life for both the patient and the family.  We discussed a brief life review of the patient and then focused on their current illness.   I attempted to elicit values and goals of care important to the patient.    Medical History Review and Understanding:  Reviewed patient's chronic medical conditions, particularly dementia and acute COVID infection.  Patient's family states they understand his "life is limited."  Social History: Patient has been married for 62 years. He moved to memory care about 3 to 4 weeks ago after his wife broke her elbow and good no longer take care of him.  It was also getting more difficult for her to help  him overall prior to this.  He has a son and a daughter.  Functional and Nutritional State: Patient's son states that he is "physically doing good" given that he walks a little bit.[name] Kitchen  He does not talk very much.  Palliative Symptoms: Rhinorrhea/allergic rhinitis, agitation/confusion  Code Status: Concepts specific to code status, artifical feeding and hydration, and rehospitalization were considered and discussed.  DNR confirmed.  Discussion: Had a phone conversation with patient's son Edward Suarez initially.  He states he has had a battle with cancer in the past and is familiar with palliative care at that time, which was helpful.  He is unaware of any goals that his father had and states he is "suspicious that his quality of life is not very good."  He confirms that he would like current care destabilize the patient.  I explored his thoughts on outpatient hospice and he is hesitant, stating that he "[facility] to provide the care that patient needs although he realizes that this may be something useful soon.  He thinks patient's wife will need to talk to him about this, as the patient responds best to her.  I then had a phone conversation with patient's wife Edward Suarez.  The difference between outpatient palliative care and hospice services were explained in detail.  Her priorities at this time are for patient to have "peace, freedom from pain, and compliance" and not prolong things.  She is leaning towards hospice, but wants to ensure that her children would not object first.  Patient's daughter actually had plans to come visit on Friday before this COVID infection.  Edward Suarez is having a difficult time not being  able to visit due to [place] and just wants to do what is right for the patient.   The difference between aggressive medical intervention and comfort care was considered in light of the patient's goals of care.   Discussed the importance of continued conversation with family and the medical  providers regarding overall plan of care and treatment options, ensuring decisions are within the context of the patient's values and GOCs.   Questions and concerns were addressed.  The family was encouraged to call with questions or concerns.  PMT will continue to support holistically.   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  Cardiovascular:     Rate and Rhythm: Normal rate.  Neurological:     Mental Status: He is alert.    Vital Signs: BP (!) 156/57 (BP Location: Left Arm)   Pulse 84   Temp 98.5 F (36.9 C) (Oral)   Resp 17   Ht 5\' 6"  (1.676 m)   Wt 62.6 kg   SpO2 96%   BMI 22.27 kg/m  Pain Scale: Faces   Pain Score: 0-No pain   SpO2: SpO2: 96 % O2 Device:SpO2: 96 % O2 Flow Rate: .   Palliative Assessment/Data:    MDM: High  Edward Karim [name], PA-C  Palliative Medicine Team Team phone # [phone]  Thank you for allowing the Palliative Medicine Team to assist in the care of this patient. Please utilize secure chat with additional questions, if there is no response within 30 minutes please call the above phone number.  Palliative Medicine Team providers are  available by phone from 7am to 7pm daily and can be reached through the team cell phone.  Should this patient require assistance outside of these hours, please call the patient's attending physician.

## 2022-06-30 NOTE — ED Notes (Signed)
Called staffing to obtain a sitter at 1100 d/t current sitter leaves at 1100. Per staffing no sitters available and to call the [facility]

## 2022-06-30 NOTE — ED Notes (Signed)
Messaged pharmacy about malnopiravir not having been sent to ED

## 2022-07-01 DIAGNOSIS — U071 COVID-19: Secondary | ICD-10-CM | POA: Diagnosis not present

## 2022-07-01 LAB — FERRITIN: Ferritin: 115 ng/mL (ref 24–336)

## 2022-07-01 LAB — GLUCOSE, CAPILLARY
Glucose-Capillary: 141 mg/dL — ABNORMAL HIGH (ref 70–99)
Glucose-Capillary: 153 mg/dL — ABNORMAL HIGH (ref 70–99)
Glucose-Capillary: 162 mg/dL — ABNORMAL HIGH (ref 70–99)
Glucose-Capillary: 183 mg/dL — ABNORMAL HIGH (ref 70–99)
Glucose-Capillary: 195 mg/dL — ABNORMAL HIGH (ref 70–99)
Glucose-Capillary: 220 mg/dL — ABNORMAL HIGH (ref 70–99)

## 2022-07-01 LAB — BASIC METABOLIC PANEL
Anion gap: 11 (ref 5–15)
BUN: 34 mg/dL — ABNORMAL HIGH (ref 8–23)
CO2: 21 mmol/L — ABNORMAL LOW (ref 22–32)
Calcium: 8.5 mg/dL — ABNORMAL LOW (ref 8.9–10.3)
Chloride: 108 mmol/L (ref 98–111)
Creatinine, Ser: 1.76 mg/dL — ABNORMAL HIGH (ref 0.61–1.24)
GFR, Estimated: 37 mL/min — ABNORMAL LOW (ref >60)
Glucose, Bld: 199 mg/dL — ABNORMAL HIGH (ref 70–99)
Potassium: 3.9 mmol/L (ref 3.5–5.1)
Sodium: 140 mmol/L (ref 135–145)

## 2022-07-01 LAB — CBC
HCT: 43.3 % (ref 39.0–52.0)
Hemoglobin: 14.3 g/dL (ref 13.0–17.0)
MCH: 26.9 pg (ref 26.0–34.0)
MCHC: 33 g/dL (ref 30.0–36.0)
MCV: 81.5 fL (ref 80.0–100.0)
Platelets: 213 10*3/uL (ref 150–400)
RBC: 5.31 MIL/uL (ref 4.22–5.81)
RDW: 16.4 % — ABNORMAL HIGH (ref 11.5–15.5)
WBC: 6.4 10*3/uL (ref 4.0–10.5)
nRBC: 0 % (ref 0.0–0.2)

## 2022-07-01 LAB — C-REACTIVE PROTEIN: CRP: 1.8 mg/dL — ABNORMAL HIGH (ref <1.0)

## 2022-07-01 MED ORDER — MELATONIN 5 MG PO TABS
5.0000 mg | ORAL_TABLET | Freq: Once | ORAL | Status: AC
  Administered 2022-07-01: 5 mg via ORAL
  Filled 2022-07-01: qty 1

## 2022-07-01 NOTE — Progress Notes (Signed)
Daily Progress Note   Patient Name: Edward Suarez       Date: 07/01/2022 DOB: 10-Feb-1932  Age: 88 y.o. MRN#: [id number] Attending Physician: Zannie Cove, MD Primary Care Physician: Mast, Man X, NP Admit Date: 06/29/2022  Reason for Consultation/Follow-up: Establishing goals of care  Subjective: Medical records reviewed including progress notes, labs. Patient assessed at the bedside.  He is more alert today, able to answer some of my questions and deny pain or distress.  He remains confused.  No family present during my visit.  I then called patient's wife [name] to follow-up on goals of care conversation and provide palliative support.  Updates on his current status were provided, as well as expectations for readiness for discharge tomorrow. She confirms that after discussion with her children, there is no opposition to moving forward with hospice support.  She wants to ensure that patient will get his medications for agitation and I provided reassurance of the expert symptom management available through hospice.  I also called patient's son [name] to confirm the above and provide palliative support. He request further explanation of hospice and education was provided. I emphasized that the natural disease process will be allowed to continue rather than life-prolonging interventions or rehospitalizations and that he would have relief from suffering at end-of-life.  He verbalizes agreement with this plan and has no further questions at this time.  He states that patient's wife wife is likely a better primary contact given her open availability.  Questions and concerns addressed. PMT will continue to support holistically.   Length of Stay: 2   Physical Exam Vitals and nursing note reviewed.   Constitutional:      General: He is not in acute distress.    Appearance: He is ill-appearing.  Cardiovascular:     Rate and Rhythm: Normal rate.  Pulmonary:     Effort: Pulmonary effort is normal.  Neurological:     Mental Status: He is alert. He is disoriented.  Psychiatric:        Mood and Affect: Mood normal.        Behavior: Behavior normal.            Vital Signs: BP (!) 156/75 (BP Location: Left Arm)   Pulse 72   Temp 98.2 F (36.8 C)   Resp 16  Ht 5\' 6"  (1.676 m)   Wt 62.6 kg   SpO2 96%   BMI 22.27 kg/m  SpO2: SpO2: 96 % O2 Device: O2 Device: Room Air O2 Flow Rate:        Palliative Assessment/Data: 30%    Palliative Care Assessment & Plan   Patient Profile: 88 y.o. male  with past medical history of CAD, CHF EF 30-35%, CHB, s/p PPM, CKD3b, COPD, P.Afib on eliquis admitted on 06/29/2022 with lethargy, fall, worsening confusion.    [facility].  Per documents at arrival, goals are for comfort care however patient's son is now interested in conservative management. PMT has been consulted to assist with goals of care conversation.  Assessment: Goals of care conversation Advanced dementia with behavioral disturbances Acute metabolic encephalopathy [id number] infection  Recommendations/Plan: Continue DNR Continue current care Goal is for return to [facility] with hospice to focus on comfort [facility] consulted for assistance with referral to hospice Psychosocial and emotional support provided PMT will continue to follow and support as needed   Prognosis:  < 6 months  Discharge Planning: [place] with Hospice  Care plan was discussed with patient, patient's wife, patient's son, Dr. Broadus John    MDM high         Tay Whitwell [name], PA-C  Palliative Medicine Team Team phone # [phone]  Thank you for allowing the Palliative Medicine Team to assist in the care of this  patient. Please utilize secure chat with additional questions, if there is no response within 30 minutes please call the above phone number.  Palliative Medicine Team providers are available by phone from 7am to 7pm daily and can be reached through the team cell phone.  Should this patient require assistance outside of these hours, please call the patient's attending physician.

## 2022-07-01 NOTE — Plan of Care (Signed)
  Problem: Fluid Volume: Goal: Ability to maintain a balanced intake and output will improve Outcome: Not Progressing   Problem: Health Behavior/Discharge Planning: Goal: Ability to manage health-related needs will improve Outcome: Not Progressing  Patient remains confused, only oriented to self.

## 2022-07-01 NOTE — Progress Notes (Signed)
PROGRESS NOTE    CREEK GAN  WUJ:[id number] DOB: 18-Nov-1931 DOA: 06/29/2022 PCP: Mast, Man X, NP   Edward Suarez is a 89/A w/ H/o CAD, CHF EF 30-35%, CHB, s/p PPM, CKD3b, COPD, P.Afib on eliquis, moved to [facility] memory care unit 3 weeks ago brought to the ED with increased lethargy and confusion, tested positive for COVID on 10/13, in the emergency room was afebrile, sats of 92 to 94% on room air, chest x-ray was clear, creatinine was 1.6  Subjective: -remains pleasantly confused, refused breakfast, denied dyspnea, cough  Assessment and Plan:  Acute encephalopathy COVID-19 virus infection -Suspect his encephalopathy is likely secondary to active COVID-19 infection -No evidence of hypoxia or pneumonia on imaging at this time -Given gentle IV fluids yesterday, started on molnupiravir, continue day 3/5 -Airborne precautions, trend CRP/ferritin and D-dimer -SLP eval completed, mild aspiration risk, recommended dysphagia 3 diet with thin liquids -Discussed with son 10/14 who wishes for gentle conservative management at this time (fluids, antibiotics/antivirals etc.), he is DNR, discussed with wife yesterday who requested a palliative care eval, she wishes for more comfort focused care -UA still not collected from admission, -Mental status improving, PT OT eval -discharge planning   Chronic systolic CHF, prior echo with EF 30-35% -Clinically appears dry at this time, hold Farxiga and valsartan at this time -off IV fluids, diuretics on hold   CKD 3a -Stable at baseline, hold valsartan   Hyperkalemia -Likely hemolyzed specimen, resolved   Paroxysmal atrial fibrillation History of complete heart block, s/p PPM -In sinus rhythm at this time, continue apixaban   History of dementia with behavioral disturbances -Continue Aricept and Namenda -Hold Ativan -At risk of increased agitation and delirium in this setting -Resume risperidone, dose increased 0.5 mg twice  daily -Continue Depakote sprinkles -QTc is prolonged,  -Palliative consult requested for GoC per wife's request -today with restraints   Type 2 diabetes mellitus -Hold Farxiga, sliding scale insulin for now    DVT prophylaxis: Apixaban Code Status: DNR Family Communication: no family at bedside, called and updated son [name] [phone] and wife yesterday Disposition Plan: Back to [place] memory care when stable, likley tomorrow  = Consultants:    Procedures:   Antimicrobials:    Objective: Vitals:   06/30/22 2029 07/01/22 0007 07/01/22 0444 07/01/22 0749  BP: (!) 152/56 (!) 145/61 (!) 147/80 (!) 156/75  Pulse: 80 69 69 72  Resp: 16 16 16 16   Temp: 98.8 F (37.1 C)   98.2 F (36.8 C)  TempSrc: Oral     SpO2: 95% 95% 98% 96%  Weight:      Height:        Intake/Output Summary (Last 24 hours) at 07/01/2022 1124 Last data filed at 07/01/2022 0602 Gross per 24 hour  Intake 1149.16 ml  Output 650 ml  Net 499.16 ml   Filed Weights   06/29/22 1434  Weight: 62.6 kg    Examination:  AAOx1, confused in restraints CVS: S1S2/RRR Lungs: CTAB Abd: soft, NT, BS present Ext: no edema Psychiatry: Poor insight and judgment    Data Reviewed:   CBC: Recent Labs  Lab 06/29/22 1545 06/29/22 1605 07/01/22 0238  WBC 6.4  --  6.4  NEUTROABS 4.8  --   --   HGB 14.4 9.9* 14.3  HCT 44.5 29.0* 43.3  MCV 82.9  --  81.5  PLT 222  --  696   Basic Metabolic Panel: Recent Labs  Lab 06/29/22 1545 06/29/22 1605 06/29/22 2006  07/01/22 0238  NA 144 146* 143 140  K 4.3 6.9* 4.5 3.9  CL 110  --  111 108  CO2 24  --  21* 21*  GLUCOSE 171*  --  209* 199*  BUN 21  --  22 34*  CREATININE 1.68*  --  1.67* 1.76*  CALCIUM 9.1  --  8.8* 8.5*   GFR: Estimated Creatinine Clearance: 25.7 mL/min (A) (by C-G formula based on SCr of 1.76 mg/dL (H)). Liver Function Tests: Recent Labs  Lab 06/29/22 1545  AST 25  ALT 29  ALKPHOS 77  BILITOT 0.5  PROT 6.2*  ALBUMIN 3.6    No results for input(s): "LIPASE", "AMYLASE" in the last 168 hours. No results for input(s): "AMMONIA" in the last 168 hours. Coagulation Profile: No results for input(s): "INR", "PROTIME" in the last 168 hours. Cardiac Enzymes: No results for input(s): "CKTOTAL", "CKMB", "CKMBINDEX", "TROPONINI" in the last 168 hours. BNP (last 3 results) No results for input(s): "PROBNP" in the last 8760 hours. HbA1C: Recent Labs    06/29/22 2002  HGBA1C 9.5*   CBG: Recent Labs  Lab 06/30/22 1606 06/30/22 2026 07/01/22 0006 07/01/22 0443 07/01/22 0753  GLUCAP 189* 122* 220* 162* 141*   Lipid Profile: Recent Labs    06/29/22 1545  TRIG 97   Thyroid Function Tests: No results for input(s): "TSH", "T4TOTAL", "FREET4", "T3FREE", "THYROIDAB" in the last 72 hours. Anemia Panel: Recent Labs    06/30/22 0437 07/01/22 0238  FERRITIN 121 115   Urine analysis:    Component Value Date/Time   COLORURINE STRAW (A) 09/02/2018 0151   APPEARANCEUR CLEAR 09/02/2018 0151   LABSPEC 1.009 09/02/2018 0151   PHURINE 5.0 09/02/2018 0151   GLUCOSEU NEGATIVE 09/02/2018 0151   HGBUR NEGATIVE 09/02/2018 0151   BILIRUBINUR NEGATIVE 09/02/2018 0151   KETONESUR NEGATIVE 09/02/2018 0151   PROTEINUR NEGATIVE 09/02/2018 0151   NITRITE NEGATIVE 09/02/2018 0151   LEUKOCYTESUR NEGATIVE 09/02/2018 0151   Sepsis Labs: @LABRCNTIP (procalcitonin:4,lacticidven:4)  ) Recent Results (from the past 240 hour(s))  Blood Culture (routine x 2)     Status: None (Preliminary result)   Collection Time: 06/29/22  3:40 PM   Specimen: BLOOD  Result Value Ref Range Status   Specimen Description BLOOD SITE NOT SPECIFIED  Final   Special Requests   Final    BOTTLES DRAWN AEROBIC AND ANAEROBIC Blood Culture adequate volume   Culture   Final    NO GROWTH 2 DAYS Performed at [facility], [place] [address], [place], [place] [address]    Report Status PENDING  Incomplete  Blood Culture (routine x 2)     Status: None  (Preliminary result)   Collection Time: 06/29/22  3:45 PM   Specimen: BLOOD  Result Value Ref Range Status   Specimen Description BLOOD SITE NOT SPECIFIED  Final   Special Requests   Final    BOTTLES DRAWN AEROBIC AND ANAEROBIC Blood Culture adequate volume   Culture   Final    NO GROWTH 2 DAYS Performed at [facility], [address] [place], [place] [address]    Report Status PENDING  Incomplete  SARS Coronavirus 2 by RT PCR (hospital order, performed in [facility] hospital lab) *cepheid single result test* Peripheral     Status: Abnormal   Collection Time: 06/29/22  4:00 PM   Specimen: Peripheral; Nasal Swab  Result Value Ref Range Status   SARS Coronavirus 2 by RT PCR POSITIVE (A) NEGATIVE Final  Comment: (NOTE) SARS-CoV-2 target nucleic acids are DETECTED  SARS-CoV-2 RNA is generally detectable in upper respiratory specimens  during the acute phase of infection.  Positive results are indicative  of the presence of the identified virus, but do not rule out bacterial infection or co-infection with other pathogens not detected by the test.  Clinical correlation with patient history and  other diagnostic information is necessary to determine patient infection status.  The expected result is negative.  Fact Sheet for Patients:   https://www.patel.info/   Fact Sheet for Healthcare Providers:   https://hall.com/    This test is not yet approved or cleared by the [place] FDA and  has been authorized for detection and/or diagnosis of SARS-CoV-2 by FDA under an Emergency Use Authorization (EUA).  This EUA will remain in effect (meaning this test can be used) for the duration of  the COVID-19 declaration under Section 564(b)(1)  of the Act, 21 U.S.C. section 360-bbb-3(b)(1), unless the authorization is terminated or revoked sooner.   Performed at [facility], [place] [address] [place], [place] [address]       Radiology Studies: CT Head Wo Contrast  Result Date: 06/29/2022 CLINICAL DATA:  Increased lethargy and weakness, COVID EXAM: CT HEAD WITHOUT CONTRAST TECHNIQUE: Contiguous axial images were obtained from the base of the skull through the vertex without intravenous contrast. RADIATION DOSE REDUCTION: This exam was performed according to the departmental dose-optimization program which includes automated exposure control, adjustment of the mA and/or kV according to patient size and/or use of iterative reconstruction technique. COMPARISON:  05/17/2021 FINDINGS: Brain: No evidence of acute infarction, hemorrhage, mass, mass effect, or midline shift. No hydrocephalus or extra-axial fluid collection. Previously noted right cerebral convexity hematoma has resolved. Periventricular white matter changes, likely the sequela of chronic small vessel ischemic disease. Vascular: No hyperdense vessel. Skull: Normal. Negative for fracture or focal lesion. Sinuses/Orbits: Mild mucosal thickening in the ethmoid air cells. Status post bilateral lens replacements. Other: The mastoid air cells are well aerated. IMPRESSION: No acute intracranial process. A subdural hematoma noted on the 05/17/2021 head CT has resolved. Electronically Signed   By: Merilyn Baba M.D.   On: 06/29/2022 17:25   DG Chest Port 1 View  Result Date: 06/29/2022 CLINICAL DATA:  COVID-19 infection. Hypoxia. Increased lethargy and weakness since yesterday. Rhonchi. EXAM: PORTABLE CHEST 1 VIEW COMPARISON:  05/17/2021 FINDINGS: Normal sized heart. Aortic arch calcifications. Clear lungs with normal vascularity. Stable right subclavian bipolar pacemaker leads. Old, healed right rib fractures. IMPRESSION: No acute abnormality. Electronically Signed   By: Claudie Revering M.D.   On: 06/29/2022 15:51     Scheduled Meds:  apixaban  2.5 mg Oral BID   atorvastatin  80 mg Oral QPM   divalproex  250 mg Oral BID   donepezil  10 mg Oral QPM   Gerhardt's butt  cream   Topical BID   insulin aspart  0-9 Units Subcutaneous Q4H   levothyroxine  75 mcg Oral Daily   memantine  10 mg Oral BID   molnupiravir EUA  4 capsule Oral BID   risperiDONE  0.25 mg Oral BID   Continuous Infusions:     LOS: 2 days    Time spent: 38min    Domenic Polite, MD [facility]   07/01/2022, 11:24 AM

## 2022-07-02 LAB — BASIC METABOLIC PANEL
Anion gap: 9 (ref 5–15)
BUN: 27 mg/dL — ABNORMAL HIGH (ref 8–23)
CO2: 25 mmol/L (ref 22–32)
Calcium: 8.8 mg/dL — ABNORMAL LOW (ref 8.9–10.3)
Chloride: 108 mmol/L (ref 98–111)
Creatinine, Ser: 1.65 mg/dL — ABNORMAL HIGH (ref 0.61–1.24)
GFR, Estimated: 40 mL/min — ABNORMAL LOW (ref >60)
Glucose, Bld: 112 mg/dL — ABNORMAL HIGH (ref 70–99)
Potassium: 3.6 mmol/L (ref 3.5–5.1)
Sodium: 142 mmol/L (ref 135–145)

## 2022-07-02 LAB — CBC
HCT: 45.7 % (ref 39.0–52.0)
Hemoglobin: 14.9 g/dL (ref 13.0–17.0)
MCH: 26.6 pg (ref 26.0–34.0)
MCHC: 32.6 g/dL (ref 30.0–36.0)
MCV: 81.5 fL (ref 80.0–100.0)
Platelets: 229 10*3/uL (ref 150–400)
RBC: 5.61 MIL/uL (ref 4.22–5.81)
RDW: 16.3 % — ABNORMAL HIGH (ref 11.5–15.5)
WBC: 4.8 10*3/uL (ref 4.0–10.5)
nRBC: 0 % (ref 0.0–0.2)

## 2022-07-02 LAB — GLUCOSE, CAPILLARY
Glucose-Capillary: 106 mg/dL — ABNORMAL HIGH (ref 70–99)
Glucose-Capillary: 124 mg/dL — ABNORMAL HIGH (ref 70–99)
Glucose-Capillary: 129 mg/dL — ABNORMAL HIGH (ref 70–99)
Glucose-Capillary: 133 mg/dL — ABNORMAL HIGH (ref 70–99)
Glucose-Capillary: 143 mg/dL — ABNORMAL HIGH (ref 70–99)
Glucose-Capillary: 168 mg/dL — ABNORMAL HIGH (ref 70–99)

## 2022-07-02 LAB — FERRITIN: Ferritin: 103 ng/mL (ref 24–336)

## 2022-07-02 LAB — D-DIMER, QUANTITATIVE: D-Dimer, Quant: <0.27 ug/mL-FEU (ref 0.00–0.50)

## 2022-07-02 MED ORDER — ACETAMINOPHEN 325 MG PO TABS: 650.0000 mg | ORAL_TABLET | Freq: Four times a day (QID) | ORAL | Status: DC | PRN

## 2022-07-02 MED ORDER — MOLNUPIRAVIR EUA 200MG CAPSULE: 4.0000 | ORAL_CAPSULE | Freq: Two times a day (BID) | ORAL | 0 refills | Status: DC

## 2022-07-02 MED ORDER — MELATONIN 5 MG PO TABS
5.0000 mg | ORAL_TABLET | Freq: Once | ORAL | Status: AC
  Administered 2022-07-02: 5 mg via ORAL
  Filled 2022-07-02: qty 1

## 2022-07-02 NOTE — Discharge Summary (Signed)
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 (HCC)   PAF (paroxysmal atrial fibrillation) ([place])   HFrEF (heart failure with reduced ejection fraction) ([place])   Late onset Alzheimer's dementia with behavioral disturbance ([place])   Acute encephalopathy   Discharge Condition: improved  Diet recommendation: comfort feeds, D3 low sodium  Filed Weights   06/29/22 1434  Weight: 62.6 kg    History of present illness:  Edward Suarez is a 27/Z w/ H/o CAD, CHF EF 30-35%, CHB, s/p PPM, CKD3b, COPD, P.Afib on eliquis, moved to [facility] memory care unit 3 weeks ago brought to the ED with increased lethargy and confusion, tested positive for COVID on 10/13, in the emergency room was afebrile, sats of 92 to 94% on room air, chest x-ray was clear, creatinine was 1.6  Hospital Course:   Acute encephalopathy COVID-19 virus infection -Suspect his encephalopathy is likely secondary to active COVID-19 infection, in the background of advanced dementia -No evidence of hypoxia or pneumonia on imaging at this time -Given gentle IV fluids, started on molnupiravir, completed 5days -SLP eval completed, mild aspiration risk, recommended dysphagia 3 diet with thin liquids - discussed with wife who requested a palliative care eval, she wishes for more comfort focused care -remains confused but stable otherwise -seen By palliative team, s/p family meeting, plan for Hospice services at discharge for comfort focused care   Chronic systolic CHF, prior  echo with EF 30-35% -Clinically appears dry at this time, discontinued Farxiga and valsartan at this time -off IV fluids, not on diuretics at baseline   CKD 3a -Stable at baseline, held valsartan   Hyperkalemia -Likely hemolyzed specimen, resolved   Paroxysmal atrial fibrillation History of complete heart block, s/p PPM -In sinus rhythm at this time, continue apixaban   History of dementia with behavioral disturbances -Continue Aricept and Namenda -had increased agitation and delirium in this setting -Resumed risperidone -Continue Depakote sprinkles -now going back to memory care unit with hospice   Type 2 diabetes mellitus -Farxiga discontinued    Consultations: Palliative care  Discharge Exam: Vitals:   07/03/22 0423 07/03/22 0831  BP: (!) 112/56 (!) 148/71  Pulse: 70 69  Resp: 17 18  Temp: 98.1 F (36.7 C) 98.2 F (36.8 C)  SpO2: 93% 99%   AAOx1, confused in restraints CVS: S1S2/RRR Lungs: CTAB Abd: soft, NT, BS present Ext: no edema Psychiatry: Poor insight and judgment   Discharge Instructions    Allergies as of 07/03/2022       Reactions   Penicillins Rash, Other (See Comments)   Childhood allergy    Penicillin G Benzathine Rash        Medication List     STOP taking these medications    dapagliflozin propanediol 10 MG Tabs tablet Commonly known as: FARXIGA   valsartan 40 MG tablet Commonly known as: Diovan       TAKE these medications    acetaminophen 325 MG tablet Commonly known as: TYLENOL Take 2 tablets (650 mg total) by mouth every 6 (six) hours as needed for mild pain or headache.   atorvastatin  80 MG tablet Commonly known as: LIPITOR TAKE ONE TABLET BY MOUTH EVERY EVENING What changed: when to take this   divalproex 125 MG capsule Commonly known as: DEPAKOTE SPRINKLE Take 250 mg by mouth 2 (two) times daily.   docusate sodium 100 MG capsule Commonly known as: COLACE Take 100 mg by mouth 2 (two) times daily.    Eliquis 2.5 MG Tabs tablet Generic drug: apixaban TAKE ONE TABLET BY MOUTH TWICE DAILY What changed: how much to take   haloperidol 2 MG tablet Commonly known as: HALDOL Take 2 mg by mouth as needed for agitation (Or anxiety). *Give either the tablet or IM. Can not be given together.*   haloperidol lactate 5 MG/ML injection Commonly known as: HALDOL Inject 2 mg into the muscle as needed (For agitation or anxiety). *Give either the tablet or IM. Can not be given together.*   isosorbide mononitrate 30 MG 24 hr tablet Commonly known as: IMDUR TAKE 1/2 TABLET BY MOUTH EVERY MORNING What changed: when to take this   lactose free nutrition Liqd Take 237 mLs by mouth 2 (two) times daily between meals.   levothyroxine 75 MCG tablet Commonly known as: SYNTHROID Take 75 mcg by mouth daily before breakfast.   LORazepam 2 MG/ML injection Commonly known as: ATIVAN Inject 2 mg into the muscle every 8 (eight) hours as needed for anxiety (Agitation). Give either the gel or IM. Cannot be given together.   memantine 10 MG tablet Commonly known as: Namenda Take 1 tablet (10 mg total) by mouth 2 (two) times daily.   nitroGLYCERIN 0.4 MG SL tablet Commonly known as: NITROSTAT Place 1 tablet (0.4 mg total) under the tongue every 5 (five) minutes as needed for chest pain.   risperiDONE 0.25 MG tablet Commonly known as: RISPERDAL Take 1 tablet (0.25 mg total) by mouth 2 (two) times daily.       Allergies  Allergen Reactions   Penicillins Rash and Other (See Comments)    Childhood allergy    Penicillin G Benzathine Rash      The results of significant diagnostics from this hospitalization (including imaging, microbiology, ancillary and laboratory) are listed below for reference.    Significant Diagnostic Studies: CT Head Wo Contrast  Result Date: 06/29/2022 CLINICAL DATA:  Increased lethargy and weakness, COVID EXAM: CT HEAD WITHOUT CONTRAST TECHNIQUE: Contiguous axial images were  obtained from the base of the skull through the vertex without intravenous contrast. RADIATION DOSE REDUCTION: This exam was performed according to the departmental dose-optimization program which includes automated exposure control, adjustment of the mA and/or kV according to patient size and/or use of iterative reconstruction technique. COMPARISON:  05/17/2021 FINDINGS: Brain: No evidence of acute infarction, hemorrhage, mass, mass effect, or midline shift. No hydrocephalus or extra-axial fluid collection. Previously noted right cerebral convexity hematoma has resolved. Periventricular white matter changes, likely the sequela of chronic small vessel ischemic disease. Vascular: No hyperdense vessel. Skull: Normal. Negative for fracture or focal lesion. Sinuses/Orbits: Mild mucosal thickening in the ethmoid air cells. Status post bilateral lens replacements. Other: The mastoid air cells are well aerated. IMPRESSION: No acute intracranial process. A subdural hematoma noted on the 05/17/2021 head CT has resolved. Electronically Signed   By: Wiliam Ke M.D.   On: 06/29/2022 17:25   DG Chest Port 1 View  Result Date: 06/29/2022 CLINICAL DATA:  COVID-19 infection. Hypoxia. Increased lethargy and weakness since yesterday. Rhonchi. EXAM: PORTABLE CHEST 1 VIEW COMPARISON:  05/17/2021 FINDINGS: Normal sized heart. Aortic arch calcifications.  Clear lungs with normal vascularity. Stable right subclavian bipolar pacemaker leads. Old, healed right rib fractures. IMPRESSION: No acute abnormality. Electronically Signed   By: Beckie Salts M.D.   On: 06/29/2022 15:51    Microbiology: Recent Results (from the past 240 hour(s))  Blood Culture (routine x 2)     Status: None (Preliminary result)   Collection Time: 06/29/22  3:40 PM   Specimen: BLOOD  Result Value Ref Range Status   Specimen Description BLOOD SITE NOT SPECIFIED  Final   Special Requests   Final    BOTTLES DRAWN AEROBIC AND ANAEROBIC Blood Culture  adequate volume   Culture   Final    NO GROWTH 4 DAYS Performed at [facility], [address] [place], [place] [address]    Report Status PENDING  Incomplete  Blood Culture (routine x 2)     Status: None (Preliminary result)   Collection Time: 06/29/22  3:45 PM   Specimen: BLOOD  Result Value Ref Range Status   Specimen Description BLOOD SITE NOT SPECIFIED  Final   Special Requests   Final    BOTTLES DRAWN AEROBIC AND ANAEROBIC Blood Culture adequate volume   Culture   Final    NO GROWTH 4 DAYS Performed at [facility], [address] [place], [place] [address]    Report Status PENDING  Incomplete  SARS Coronavirus 2 by RT PCR (hospital order, performed in [facility] hospital lab) *cepheid single result test* Peripheral     Status: Abnormal   Collection Time: 06/29/22  4:00 PM   Specimen: Peripheral; Nasal Swab  Result Value Ref Range Status   SARS Coronavirus 2 by RT PCR POSITIVE (A) NEGATIVE Final    Comment: (NOTE) SARS-CoV-2 target nucleic acids are DETECTED  SARS-CoV-2 RNA is generally detectable in upper respiratory specimens  during the acute phase of infection.  Positive results are indicative  of the presence of the identified virus, but do not rule out bacterial infection or co-infection with other pathogens not detected by the test.  Clinical correlation with patient history and  other diagnostic information is necessary to determine patient infection status.  The expected result is negative.  Fact Sheet for Patients:   RoadLapTop.co.za   Fact Sheet for Healthcare Providers:   http://kim-miller.com/    This test is not yet approved or cleared by the [place] FDA and  has been authorized for detection and/or diagnosis of SARS-CoV-2 by FDA under an Emergency Use Authorization (EUA).  This EUA will remain in effect (meaning this test can be used) for the duration of  the COVID-19 declaration under  Section 564(b)(1)  of the Act, 21 U.S.C. section 360-bbb-3(b)(1), unless the authorization is terminated or revoked sooner.   Performed at [facility], [address] [place], [place] [address]      Labs: Basic Metabolic Panel: Recent Labs  Lab 06/29/22 1545 06/29/22 1605 06/29/22 2006 07/01/22 0238 07/02/22 0242  NA 144 146* 143 140 142  K 4.3 6.9* 4.5 3.9 3.6  CL 110  --  111 108 108  CO2 24  --  21* 21* 25  GLUCOSE 171*  --  209* 199* 112*  BUN 21  --  22 34* 27*  CREATININE 1.68*  --  1.67* 1.76* 1.65*  CALCIUM 9.1  --  8.8* 8.5* 8.8*   Liver Function Tests: Recent Labs  Lab 06/29/22 1545  AST 25  ALT 29  ALKPHOS 77  BILITOT 0.5  PROT 6.2*  ALBUMIN 3.6   No results for input(s): "LIPASE", "AMYLASE" in the last 168 hours. No results for input(s): "AMMONIA" in the last 168 hours. CBC: Recent Labs  Lab 06/29/22 1545 06/29/22 1605 07/01/22 0238 07/02/22 0242  WBC 6.4  --  6.4 4.8  NEUTROABS 4.8  --   --   --   HGB 14.4 9.9* 14.3 14.9  HCT 44.5 29.0* 43.3 45.7  MCV 82.9  --  81.5 81.5  PLT 222  --  213 229   Cardiac Enzymes: No results for input(s): "CKTOTAL", "CKMB", "CKMBINDEX", "TROPONINI" in the last 168 hours. BNP: BNP (last 3 results) No results for input(s): "BNP" in the last 8760 hours.  ProBNP (last 3 results) No results for input(s): "PROBNP" in the last 8760 hours.  CBG: Recent Labs  Lab 07/02/22 1655 07/02/22 2004 07/03/22 0013 07/03/22 0422 07/03/22 0832  GLUCAP 168* 133* 131* 97 113*       Signed:  Zannie Cove MD.  [facility] 07/03/2022, 10:20 AM

## 2022-07-02 NOTE — Progress Notes (Signed)
Attempted to remove restraints from patient.  Removed soft restraints while changing bed and feeding lunch, however patient hit both RN and NT.  Also trying to get out of the bed.  Restraints replaced.  Refusing most lunch. Able to feed a couple bites.

## 2022-07-02 NOTE — TOC Progression Note (Signed)
Transition of Care [facility]) - Progression Note    Patient Details  Name: Edward Suarez MRN: [id number] Date of Birth: [birth year]  Transition of Care [facility]) CM/SW [place], [place] Phone Number: 07/02/2022, 3:26 PM  Clinical Narrative:    CSW attempted to contact [facility] admissions, she will not be back in the office until tomorrow. CSW will follow up in the morning and inquire about pt returning while positive for COVID. Pt will return with hospice.   Expected Discharge Plan: Memory Care Barriers to Discharge: Continued Medical Work up, Requiring sitter/restraints  Expected Discharge Plan and Services Expected Discharge Plan: Memory Care     Post Acute Care Choice: Resumption of Svcs/PTA Provider Living arrangements for the past 2 months:  (Memory Care, [facility] [place])                                       Social Determinants of Health (SDOH) Interventions    Readmission Risk Interventions     No data to display

## 2022-07-03 DIAGNOSIS — U071 COVID-19: Secondary | ICD-10-CM | POA: Diagnosis not present

## 2022-07-03 LAB — GLUCOSE, CAPILLARY
Glucose-Capillary: 113 mg/dL — ABNORMAL HIGH (ref 70–99)
Glucose-Capillary: 131 mg/dL — ABNORMAL HIGH (ref 70–99)
Glucose-Capillary: 157 mg/dL — ABNORMAL HIGH (ref 70–99)
Glucose-Capillary: 169 mg/dL — ABNORMAL HIGH (ref 70–99)
Glucose-Capillary: 97 mg/dL (ref 70–99)

## 2022-07-03 NOTE — Progress Notes (Signed)
Manufacturing engineer [facility])  Referral received for hospice services at [facility] at discharge.  Spoke with wife Darryll Capers, provided support, answered questions, and explained hospice philosophy.  Plan to d/c today back to his facility.  Please send completed DNR and arrange for comfort meds (if indicated).  No immediate identifiable DME needs.  Thank you, Venia Carbon DNP, RN [facility] Liaison

## 2022-07-03 NOTE — NC FL2 (Signed)
[place] LEVEL OF CARE SCREENING TOOL     IDENTIFICATION  Patient Name: Edward Suarez Birthdate: [birth year] Sex: male Admission Date (Current Location): 06/29/2022  [facility] [place] and [place] Number:  Herbalist and Address:  [facility], [address] [place], [place] [address]      Provider Number: [id number]  Attending Physician Name and Address:  Domenic Polite, MD  Relative Name and Phone Number:  Sekai Gitlin, [phone]    Current Level of Care: Hospital Recommended Level of Care: Memory Care Prior Approval Number:    Date Approved/Denied:   PASRR Number:    Discharge Plan: Other (Comment) (Memory Care)    Current Diagnoses: Patient Active Problem List   Diagnosis Date Noted   Acute encephalopathy 06/29/2022   COVID-19 virus infection 06/29/2022   Encephalopathy 06/29/2022   Dysphagia 06/24/2022   Hypothyroidism 06/07/2022   Late onset Alzheimer's dementia with behavioral disturbance ([place]) 03/22/2021   DCM (dilated cardiomyopathy) ([place])    HFrEF (heart failure with reduced ejection fraction) ([place]) 10/11/2020   Chest pain of uncertain etiology 92/07/9416   Dementia arising in the senium and presenium ([place]) 11/19/2017   PAF (paroxysmal atrial fibrillation) ([place]) 11/06/2016   Amnestic MCI (mild cognitive impairment with memory loss) 10/19/2015   Cervicalgia 10/19/2015   Cardiac pacemaker in situ 10/19/2015   Chronic renal insufficiency, stage III (moderate) ([place]) 11/21/2014   Insomnia due to medical condition 08/10/2014   Allergic rhinitis 08/10/2014   Hypercholesterolemia without hypertriglyceridemia 08/10/2014   Snoring 06/15/2014   Memory loss, short term 06/15/2014   MCI (mild cognitive impairment) with memory loss 06/15/2014   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 ([place]) 40/81/4481   PVC (premature ventricular contraction) 05/05/2014   Diabetic kidney  ([place]) 05/04/2014   Amnesia 05/04/2014   Pacemaker-Medtronic 04/17/2012   Hypertension 04/15/2012   Atrioventricular block, complete ([place]) 04/13/2012   Carotid stenosis, bilateral 02/07/2012    Orientation RESPIRATION BLADDER Height & Weight     Self  Normal Incontinent, External catheter Weight: 137 lb 15.8 oz (62.6 kg) Height:  5\' 6"  (167.6 cm)  BEHAVIORAL SYMPTOMS/MOOD NEUROLOGICAL BOWEL NUTRITION STATUS      Incontinent Diet (comfort feeds, D3 low sodium)  AMBULATORY STATUS COMMUNICATION OF NEEDS Skin   Supervision Verbally Skin abrasions (Hematoma)                       Personal Care Assistance Level of Assistance  Bathing, Feeding, Dressing Bathing Assistance: Limited assistance Feeding assistance: Limited assistance Dressing Assistance: Limited assistance     Functional Limitations Info  Sight, Hearing, Speech Sight Info: Impaired Hearing Info: Adequate Speech Info: Adequate    SPECIAL CARE FACTORS FREQUENCY  Restraints               Restraints Frequency: Soft bilateral restraints    Contractures Contractures Info: Not present    Additional Factors Info  Code Status, Allergies Code Status Info: DNR Allergies Info: Penicillins, Penicillin G Benzathine           Current Medications (07/03/2022):  This is the current hospital active medication list Current Facility-Administered Medications  Medication Dose Route Frequency Provider Last Rate Last Admin   acetaminophen (TYLENOL) tablet 650 mg  650 mg Oral Q6H PRN Domenic Polite, MD   650 mg at 07/03/22 1004   apixaban (ELIQUIS) tablet 2.5 mg  2.5 mg Oral BID Domenic Polite, MD   2.5 mg at  07/03/22 1004   atorvastatin (LIPITOR) tablet 80 mg  80 mg Oral QPM Domenic Polite, MD   80 mg at 07/02/22 1725   divalproex (DEPAKOTE) DR tablet 250 mg  250 mg Oral BID Domenic Polite, MD   250 mg at 07/03/22 1005   donepezil (ARICEPT) tablet 10 mg  10 mg Oral QPM Domenic Polite, MD   10 mg at 07/02/22 1725    Gerhardt's butt cream   Topical BID Domenic Polite, MD   Given at 07/03/22 1005   insulin aspart (novoLOG) injection 0-9 Units  0-9 Units Subcutaneous Q4H Domenic Polite, MD   1 Units at 07/03/22 0028   levothyroxine (SYNTHROID) tablet 75 mcg  75 mcg Oral Daily Domenic Polite, MD   75 mcg at 07/03/22 0845   loratadine (CLARITIN) tablet 10 mg  10 mg Oral Daily PRN Domenic Polite, MD       memantine [facility]) tablet 10 mg  10 mg Oral BID Domenic Polite, MD   10 mg at 07/03/22 1003   molnupiravir EUA (LAGEVRIO) capsule 800 mg  4 capsule Oral BID Domenic Polite, MD   800 mg at 07/03/22 1006   ondansetron (ZOFRAN) tablet 4 mg  4 mg Oral Q6H PRN Domenic Polite, MD       Or   ondansetron [facility]) injection 4 mg  4 mg Intravenous Q6H PRN Domenic Polite, MD       Oral care mouth rinse  15 mL Mouth Rinse PRN Domenic Polite, MD       risperiDONE (RISPERDAL) tablet 0.25 mg  0.25 mg Oral BID Domenic Polite, MD   0.25 mg at 07/03/22 1005     Discharge Medications: acetaminophen 325 MG tablet Commonly known as: TYLENOL Take 2 tablets (650 mg total) by mouth every 6 (six) hours as needed for mild pain or headache.    atorvastatin 80 MG tablet Commonly known as: LIPITOR TAKE ONE TABLET BY MOUTH EVERY EVENING What changed: when to take this    divalproex 125 MG capsule Commonly known as: DEPAKOTE SPRINKLE Take 250 mg by mouth 2 (two) times daily.    docusate sodium 100 MG capsule Commonly known as: COLACE Take 100 mg by mouth 2 (two) times daily.    Eliquis 2.5 MG Tabs tablet Generic drug: apixaban TAKE ONE TABLET BY MOUTH TWICE DAILY What changed: how much to take    haloperidol 2 MG tablet Commonly known as: HALDOL Take 2 mg by mouth as needed for agitation (Or anxiety). *Give either the tablet or IM. Can not be given together.*    haloperidol lactate 5 MG/ML injection Commonly known as: HALDOL Inject 2 mg into the muscle as needed (For agitation or anxiety). *Give either the  tablet or IM. Can not be given together.*    isosorbide mononitrate 30 MG 24 hr tablet Commonly known as: IMDUR TAKE 1/2 TABLET BY MOUTH EVERY MORNING What changed: when to take this    lactose free nutrition Liqd Take 237 mLs by mouth 2 (two) times daily between meals.    levothyroxine 75 MCG tablet Commonly known as: SYNTHROID Take 75 mcg by mouth daily before breakfast.    LORazepam 2 MG/ML injection Commonly known as: ATIVAN Inject 2 mg into the muscle every 8 (eight) hours as needed for anxiety (Agitation). Give either the gel or IM. Cannot be given together.    memantine 10 MG tablet Commonly known as: Namenda Take 1 tablet (10 mg total) by mouth 2 (two) times daily.  nitroGLYCERIN 0.4 MG SL tablet Commonly known as: NITROSTAT Place 1 tablet (0.4 mg total) under the tongue every 5 (five) minutes as needed for chest pain.    risperiDONE 0.25 MG tablet Commonly known as: RISPERDAL Take 1 tablet (0.25 mg total) by mouth 2 (two) times daily.      Relevant Imaging Results:  Relevant Lab Results:   Additional Information [place], [place]

## 2022-07-03 NOTE — TOC Progression Note (Signed)
Transition of Care [facility]) - Progression Note    Patient Details  Name: Edward Suarez MRN: [id number] Date of Birth: 07-15-1932  Transition of Care [facility]) CM/SW [place], RN Phone Number: 07/03/2022, 12:52 PM  Clinical Narrative:     Thomas Hoff Katie at [facility] who states that they contract with [facility], [facility] and [place].  Discussed home hospice providers with patient's wife and reviewed ratings on [email or web address].  She would like referral to be made through [facility].  Referral placed to [facility] with [facility] who is aware that patient will DC today to [facility].  CSW facilitating [facility] to [facility]  Expected Discharge Plan: Memory Care Barriers to Discharge: Continued Medical Work up, Requiring sitter/restraints  Expected Discharge Plan and Services Expected Discharge Plan: Memory Care     Post Acute Care Choice: Resumption of Svcs/PTA Provider Living arrangements for the past 2 months:  (Memory Care, [place]) Expected Discharge Date: 07/03/22                           [place] Date [place]: 07/03/22 Time [place]: [id number] Representative spoke with at [place]: [place] Determinants of Health ([place]) Interventions    Readmission Risk Interventions     No data to display

## 2022-07-03 NOTE — Progress Notes (Signed)
[facility] given [facility].

## 2022-07-03 NOTE — TOC Transition Note (Signed)
Transition of Care [facility]) - CM/SW Discharge Note   Patient Details  Name: Edward Suarez MRN: [id number] Date of Birth: Dec 17, 1931  [facility]) CM/SW Contact:  Tresa Endo Phone Number: 07/03/2022, 12:43 PM   Clinical Narrative:    Patient will DC to: [facility] Anticipated DC date: 07/03/2022 Family notified: Spouse Transport by: Corey Harold   Per MD patient ready for DC to [facility]. RN to call report prior to discharge [phone]). RN, patient, patient's family, and facility notified of DC. Discharge Summary and FL2 sent to facility. DC packet on chart. Ambulance transport requested for patient.   CSW will sign off for now as social work intervention is no longer needed. Please consult [place] again if new needs arise.       Barriers to Discharge: Continued Medical Work up, Requiring sitter/restraints   Patient Goals and CMS Choice Patient states their goals for this hospitalization and ongoing recovery are:: Pt is disoriented and unable to participate in goal setting. CMS Medicare.gov Compare Post Acute Care list provided to:: Patient Represenative (must comment) Choice offered to / list presented to : Spouse, Adult Children  Discharge Placement                       Discharge Plan and Services     Post Acute Care Choice: Resumption of Svcs/PTA Provider                               Social Determinants of Health (SDOH) Interventions     Readmission Risk Interventions     No data to display

## 2022-07-04 LAB — CULTURE, BLOOD (ROUTINE X 2)
Culture: NO GROWTH
Culture: NO GROWTH
Special Requests: ADEQUATE
Special Requests: ADEQUATE

## 2022-07-05 ENCOUNTER — Encounter: Payer: Self-pay | Admitting: Nurse Practitioner

## 2022-07-05 ENCOUNTER — Non-Acute Institutional Stay (SKILLED_NURSING_FACILITY): Admitting: Nurse Practitioner

## 2022-07-05 DIAGNOSIS — Z950 Presence of cardiac pacemaker: Secondary | ICD-10-CM | POA: Diagnosis not present

## 2022-07-05 DIAGNOSIS — E039 Hypothyroidism, unspecified: Secondary | ICD-10-CM | POA: Diagnosis not present

## 2022-07-05 DIAGNOSIS — I480 Paroxysmal atrial fibrillation: Secondary | ICD-10-CM | POA: Diagnosis not present

## 2022-07-05 DIAGNOSIS — E7800 Pure hypercholesterolemia, unspecified: Secondary | ICD-10-CM | POA: Diagnosis not present

## 2022-07-05 DIAGNOSIS — I5020 Unspecified systolic (congestive) heart failure: Secondary | ICD-10-CM | POA: Diagnosis not present

## 2022-07-05 DIAGNOSIS — U071 COVID-19: Secondary | ICD-10-CM

## 2022-07-05 DIAGNOSIS — E0849 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: Secondary | ICD-10-CM | POA: Diagnosis not present

## 2022-07-05 DIAGNOSIS — I6523 Occlusion and stenosis of bilateral carotid arteries: Secondary | ICD-10-CM

## 2022-07-05 DIAGNOSIS — R1310 Dysphagia, unspecified: Secondary | ICD-10-CM | POA: Diagnosis not present

## 2022-07-05 DIAGNOSIS — E1121 Type 2 diabetes mellitus with diabetic nephropathy: Secondary | ICD-10-CM

## 2022-07-05 DIAGNOSIS — I10 Essential (primary) hypertension: Secondary | ICD-10-CM | POA: Diagnosis not present

## 2022-07-05 DIAGNOSIS — G301 Alzheimer's disease with late onset: Secondary | ICD-10-CM

## 2022-07-05 DIAGNOSIS — F02818 Dementia in other diseases classified elsewhere, unspecified severity, with other behavioral disturbance: Secondary | ICD-10-CM

## 2022-07-05 NOTE — Assessment & Plan Note (Signed)
,  taking Levothyroxine, TSH 2.82 06/13/22 

## 2022-07-05 NOTE — Assessment & Plan Note (Signed)
s/p PPM, CHB

## 2022-07-05 NOTE — Assessment & Plan Note (Signed)
on set 06/28/22,  treated with Molnupirvir x5 days,  CXR was clear, may have O2 2lpm via [place] to maintain SatO2>90%, prn DuoNeb available to him

## 2022-07-05 NOTE — Assessment & Plan Note (Signed)
on diet, Hgb a1c 9.5 06/29/22, may consider Metformin if oral intake improves.

## 2022-07-05 NOTE — Assessment & Plan Note (Signed)
Blood pressure is controlled, off Valsartan

## 2022-07-05 NOTE — Assessment & Plan Note (Signed)
off Farxiga, EF 30-35%

## 2022-07-05 NOTE — Assessment & Plan Note (Signed)
heart rate is in control. on Eliquis

## 2022-07-05 NOTE — Assessment & Plan Note (Signed)
Bun/creat 27/1.65 07/02/22 

## 2022-07-05 NOTE — Assessment & Plan Note (Signed)
taper off  Donepezil, Memantine, on Risperdal, Depakote, prn Haldol, prn Lorazepam, followed by Neurology, Hospice for supportive care, MMSE 2/30 06/07/22

## 2022-07-05 NOTE — Assessment & Plan Note (Signed)
III diet.

## 2022-07-05 NOTE — Assessment & Plan Note (Signed)
takes Atorvastatin, LDL 60 06/13/22 

## 2022-07-05 NOTE — Assessment & Plan Note (Signed)
on Statin, Isosorbide, prn NTG,

## 2022-07-05 NOTE — Progress Notes (Unsigned)
Location:   SNF [facility] Room Number: 110 Place of Service:  SNF (31) Provider: Arna Snipe Jamian Andujo NP  Noheli Melder X, NP  Patient Care Team: Tarren Sabree X, NP as PCP - General (Internal Medicine) Quintella Reichert, MD as PCP - Cardiology (Cardiology) Renford Dills, MD (Internal Medicine)  Extended Emergency Contact Information Primary Emergency Contact: Faircloth,[name] Address: [address]          [place], [place] [address] [place] Home Phone: [phone] Mobile Phone: [phone] Relation: Spouse Secondary Emergency Contact: Volkman,[name] Mobile Phone: [phone] Relation: Son  Code Status: DNR Goals of care: Advanced Directive information    07/05/2022    4:50 PM  Advanced Directives  Does Patient Have a Medical Advance Directive? Yes  Type of Estate agent of [place];Living will;Out of facility DNR (pink MOST or yellow form)  Does patient want to make changes to medical advance directive? No - Patient declined  Copy of Healthcare Power of Attorney in Chart? Yes - validated most recent copy scanned in chart (See row information)     Chief Complaint  Patient presents with   Acute Visit    Medication review following hospital stay.     HPI:  Pt is a 88 y.o. male seen today for an acute visit for medication review following hospital stay.   Hospitalized 06/29/22-07/03/22 for COVID, encephalopathy.   COVID, on set 06/28/22,  treated with Molnupirvir x5 days,  CXR was clear  Dysphagia III diet             Alzheimer's dementia, taper off  Donepezil, Memantine, on Risperdal, Depakote, prn Haldol, prn Lorazepam, followed by Neurology, Hospice for supportive care, MMSE 2/30 06/07/22             Afib followed by Cardiology, heart rate is in control. on Eliquis             CAD, on Statin, Isosorbide, prn NTG,              CHFrEF, off Farxiga, EF 30-35%  CHB s/p PPM             T2DM, on diet, Hgb a1c 9.5 06/29/22              Hypothyroidism, taking Levothyroxine, TSH 2.82 06/13/22             Hyperlipidemia, takes Atorvastatin, LDL 60 06/13/22             HTN, off Valsartan             CKD chronic, Bun/creat 27/1.65 07/02/22  Past Medical History:  Diagnosis Date   Allergic rhinitis, cause unspecified    BPH (benign prostatic hyperplasia)    Carotid artery stenosis, asymptomatic    1-39% by dopplers 09/2016 followed by Dr. Darrick Penna   Cataracts, bilateral    Chronic kidney disease (CKD), stage III (moderate) (HCC)    Complete heart block (HCC) 01/23/04   s/p Medtronic PPM implanted by Dr Amil Amen   COPD (chronic obstructive pulmonary disease) (HCC)    Diabetes mellitus    Glaucoma    Hyperlipidemia    Hypertension    Insomnia due to medical condition 08/10/2014   Kidney stones    PAF (paroxysmal atrial fibrillation) (HCC) 10/09/2014   Noted on pacer check with 88 mode switches and longest episode >1 hour.  Now on Apixiban for CHADS2VASC score of 5   Past Surgical History:  Procedure Laterality Date   PACEMAKER INSERTION  2005, 04/16/12   MDT implanted by Dr Leonia Reeves with generator chnage (MDT Adapta L) by Dr Rayann Heman 04/16/12   PERMANENT PACEMAKER GENERATOR CHANGE N/A 04/16/2012   Procedure: PERMANENT PACEMAKER GENERATOR CHANGE;  Surgeon: Thompson Grayer, MD;  Location: [facility] CATH LAB;  Service: Cardiovascular;  Laterality: N/A;   RIGHT/LEFT HEART CATH AND CORONARY ANGIOGRAPHY N/A 10/18/2020   Procedure: RIGHT/LEFT HEART CATH AND CORONARY ANGIOGRAPHY;  Surgeon: Troy Sine, MD;  Location: [facility];  Service: Cardiovascular;  Laterality: N/A;    Allergies  Allergen Reactions   Penicillins Rash and Other (See Comments)    Childhood allergy    Penicillin G Benzathine Rash    Allergies as of 07/05/2022       Reactions   Penicillins Rash, Other (See Comments)   Childhood allergy    Penicillin G Benzathine Rash        Medication List        Accurate as of July 05, 2022 11:59 PM. If you have any  questions, ask your nurse or doctor.          acetaminophen 325 MG tablet Commonly known as: TYLENOL Take 2 tablets (650 mg total) by mouth every 6 (six) hours as needed for mild pain or headache.   atorvastatin 80 MG tablet Commonly known as: LIPITOR TAKE ONE TABLET BY MOUTH EVERY EVENING What changed: when to take this   divalproex 125 MG capsule Commonly known as: DEPAKOTE SPRINKLE Take 250 mg by mouth 2 (two) times daily.   docusate sodium 100 MG capsule Commonly known as: COLACE Take 100 mg by mouth 2 (two) times daily.   Eliquis 2.5 MG Tabs tablet Generic drug: apixaban TAKE ONE TABLET BY MOUTH TWICE DAILY What changed: how much to take   haloperidol 2 MG tablet Commonly known as: HALDOL Take 2 mg by mouth as needed for agitation (Or anxiety). *Give either the tablet or IM. Can not be given together.*   haloperidol lactate 5 MG/ML injection Commonly known as: HALDOL Inject 2 mg into the muscle as needed (For agitation or anxiety). *Give either the tablet or IM. Can not be given together.*   isosorbide mononitrate 30 MG 24 hr tablet Commonly known as: IMDUR TAKE 1/2 TABLET BY MOUTH EVERY MORNING What changed: when to take this   lactose free nutrition Liqd Take 237 mLs by mouth 2 (two) times daily between meals.   levothyroxine 75 MCG tablet Commonly known as: SYNTHROID Take 75 mcg by mouth daily before breakfast.   LORazepam 2 MG/ML injection Commonly known as: ATIVAN Inject 2 mg into the muscle every 8 (eight) hours as needed for anxiety (Agitation). Give either the gel or IM. Cannot be given together.   memantine 10 MG tablet Commonly known as: Namenda Take 1 tablet (10 mg total) by mouth 2 (two) times daily.   nitroGLYCERIN 0.4 MG SL tablet Commonly known as: NITROSTAT Place 1 tablet (0.4 mg total) under the tongue every 5 (five) minutes as needed for chest pain.   risperiDONE 0.25 MG tablet Commonly known as: RISPERDAL Take 1 tablet (0.25 mg  total) by mouth 2 (two) times daily.        Review of Systems  Constitutional:  Negative for appetite change, fatigue and fever.  HENT:  Negative for congestion and trouble swallowing.   Eyes:  Negative for visual disturbance.  Respiratory:  Negative for cough, shortness of breath and wheezing.   Cardiovascular:  Negative for chest pain, palpitations and leg swelling.  Gastrointestinal:  Negative for abdominal pain, constipation and nausea.  Genitourinary:  Negative for dysuria, frequency and urgency.  Musculoskeletal:  Positive for arthralgias and gait problem.  Skin:  Negative for color change.  Neurological:  Negative for speech difficulty, weakness and headaches.  Psychiatric/Behavioral:  Positive for agitation, behavioral problems and confusion. Negative for sleep disturbance. The patient is not nervous/anxious.     Immunization History  Administered Date(s) Administered   Influenza-Unspecified 07/04/2014   Moderna SARS-COV2 Booster Vaccination 07/25/2020, 02/13/2021   Moderna Sars-Covid-2 Vaccination 09/20/2019, 10/18/2019   Pfizer Covid-19 Vaccine Bivalent Booster 63yrs & up 06/20/2021   Zoster Recombinat (Shingrix) 11/06/2017, 03/28/2018   Pertinent  Health Maintenance Due  Topic Date Due   FOOT EXAM  Never done   OPHTHALMOLOGY EXAM  Never done   INFLUENZA VACCINE  04/16/2022   HEMOGLOBIN A1C  12/29/2022      07/01/2022    8:00 PM 07/02/2022    1:00 PM 07/02/2022    7:52 PM 07/03/2022    8:00 AM 07/05/2022    4:50 PM  Nina in the past year?     0  Was there an injury with Fall?     0  Fall Risk Category Calculator     0  Fall Risk Category     Low  Patient Fall Risk Level High fall risk High fall risk High fall risk High fall risk High fall risk  Patient at Risk for Falls Due to     History of fall(s)  Fall risk Follow up     Falls evaluation completed   Functional Status Survey:    Vitals:   07/05/22 1648  BP: 136/68  Pulse: 73  Resp: 20   Temp: 97.7 F (36.5 C)  TempSrc: Temporal  SpO2: 95%  Weight: 138 lb (62.6 kg)  Height: 5\' 6"  (1.676 m)   Body mass index is 22.27 kg/m. Physical Exam Vitals and nursing note reviewed.  Constitutional:      Appearance: Normal appearance.  HENT:     Head: Normocephalic and atraumatic.     Nose: Nose normal.     Mouth/Throat:     Mouth: Mucous membranes are moist.  Eyes:     Extraocular Movements: Extraocular movements intact.     Conjunctiva/sclera: Conjunctivae normal.     Pupils: Pupils are equal, round, and reactive to light.  Cardiovascular:     Rate and Rhythm: Normal rate and regular rhythm.     Heart sounds: No murmur heard. Pulmonary:     Effort: Pulmonary effort is normal.     Breath sounds: No rales.  Abdominal:     General: Bowel sounds are normal.     Palpations: Abdomen is soft.     Tenderness: There is no abdominal tenderness.  Genitourinary:    Comments: External hemorrhoids Musculoskeletal:     Cervical back: Normal range of motion and neck supple.     Right lower leg: No edema.     Left lower leg: No edema.  Skin:    General: Skin is warm and dry.  Neurological:     General: No focal deficit present.     Mental Status: He is alert. Mental status is at baseline.     Gait: Gait abnormal.     Comments: Oriented to person  Psychiatric:     Comments: Flat affect, non cooperative.      Labs reviewed: Recent Labs    06/29/22 2006 07/01/22 0238 07/02/22 0242  NA  143 140 142  K 4.5 3.9 3.6  CL 111 108 108  CO2 21* 21* 25  GLUCOSE 209* 199* 112*  BUN 22 34* 27*  CREATININE 1.67* 1.76* 1.65*  CALCIUM 8.8* 8.5* 8.8*   Recent Labs    06/29/22 1545  AST 25  ALT 29  ALKPHOS 77  BILITOT 0.5  PROT 6.2*  ALBUMIN 3.6   Recent Labs    06/29/22 1545 06/29/22 1605 07/01/22 0238 07/02/22 0242  WBC 6.4  --  6.4 4.8  NEUTROABS 4.8  --   --   --   HGB 14.4 9.9* 14.3 14.9  HCT 44.5 29.0* 43.3 45.7  MCV 82.9  --  81.5 81.5  PLT 222  --  213  229   No results found for: "TSH" Lab Results  Component Value Date   HGBA1C 9.5 (H) 06/29/2022   Lab Results  Component Value Date   CHOL 128 12/16/2016   HDL 55 12/16/2016   LDLCALC 58 12/16/2016   TRIG 97 06/29/2022   CHOLHDL 2.3 12/16/2016    Significant Diagnostic Results in last 30 days:  CT Head Wo Contrast  Result Date: 06/29/2022 CLINICAL DATA:  Increased lethargy and weakness, COVID EXAM: CT HEAD WITHOUT CONTRAST TECHNIQUE: Contiguous axial images were obtained from the base of the skull through the vertex without intravenous contrast. RADIATION DOSE REDUCTION: This exam was performed according to the departmental dose-optimization program which includes automated exposure control, adjustment of the mA and/or kV according to patient size and/or use of iterative reconstruction technique. COMPARISON:  05/17/2021 FINDINGS: Brain: No evidence of acute infarction, hemorrhage, mass, mass effect, or midline shift. No hydrocephalus or extra-axial fluid collection. Previously noted right cerebral convexity hematoma has resolved. Periventricular white matter changes, likely the sequela of chronic small vessel ischemic disease. Vascular: No hyperdense vessel. Skull: Normal. Negative for fracture or focal lesion. Sinuses/Orbits: Mild mucosal thickening in the ethmoid air cells. Status post bilateral lens replacements. Other: The mastoid air cells are well aerated. IMPRESSION: No acute intracranial process. A subdural hematoma noted on the 05/17/2021 head CT has resolved. Electronically Signed   By: Merilyn Baba M.D.   On: 06/29/2022 17:25   DG Chest Port 1 View  Result Date: 06/29/2022 CLINICAL DATA:  COVID-19 infection. Hypoxia. Increased lethargy and weakness since yesterday. Rhonchi. EXAM: PORTABLE CHEST 1 VIEW COMPARISON:  05/17/2021 FINDINGS: Normal sized heart. Aortic arch calcifications. Clear lungs with normal vascularity. Stable right subclavian bipolar pacemaker leads. Old, healed  right rib fractures. IMPRESSION: No acute abnormality. Electronically Signed   By: Claudie Revering M.D.   On: 06/29/2022 15:51    Assessment/Plan: COVID-19 virus infection  on set 06/28/22,  treated with Molnupirvir x5 days,  CXR was clear, may have O2 2lpm via [place] to maintain SatO2>90%, prn DuoNeb available to him  Dysphagia III diet.   Late onset Alzheimer's dementia with behavioral disturbance ([place])  taper off  Donepezil, Memantine, on Risperdal, Depakote, prn Haldol, prn Lorazepam, followed by Neurology, Hospice for supportive care, MMSE 2/30 06/07/22  PAF (paroxysmal atrial fibrillation) (HCC) heart rate is in control. 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   Hypothyroidism ,taking Levothyroxine, TSH 2.82 06/13/22  Hypercholesterolemia without  hypertriglyceridemia  takes Atorvastatin, LDL 60 06/13/22  Hypertension Blood pressure is controlled, off Valsartan  Diabetic kidney Bun/creat 27/1.65 07/02/22    Family/ staff Communication: plan of care reviewed with the patient, Hospice nurse, and charge nurse.   Labs/tests ordered:  none  Time spend 35 minutes.

## 2022-07-08 ENCOUNTER — Encounter: Payer: Self-pay | Admitting: Nurse Practitioner

## 2022-07-09 ENCOUNTER — Non-Acute Institutional Stay (SKILLED_NURSING_FACILITY): Admitting: Family Medicine

## 2022-07-09 DIAGNOSIS — F02818 Dementia in other diseases classified elsewhere, unspecified severity, with other behavioral disturbance: Secondary | ICD-10-CM

## 2022-07-09 DIAGNOSIS — E0849 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: Secondary | ICD-10-CM | POA: Diagnosis not present

## 2022-07-09 DIAGNOSIS — U071 COVID-19: Secondary | ICD-10-CM | POA: Diagnosis not present

## 2022-07-09 DIAGNOSIS — G301 Alzheimer's disease with late onset: Secondary | ICD-10-CM | POA: Diagnosis not present

## 2022-07-09 DIAGNOSIS — N1831 Chronic kidney disease, stage 3a: Secondary | ICD-10-CM

## 2022-07-09 NOTE — Progress Notes (Signed)
Provider:  Alain Honey, MD Location:      Place of Service:     PCP: Mast, Man X, NP Patient Care Team: Mast, Man X, NP as PCP - General (Internal Medicine) Sueanne Margarita, MD as PCP - Cardiology (Cardiology) Seward Carol, MD (Internal Medicine)  Extended Emergency Contact Information Primary Emergency Contact: [facility] Address: [phone]          [place], [place] [address] [name] of [place] Phone: [phone] Mobile Phone: [phone] Relation: Spouse Secondary Emergency Contact: Chesmore,[name] Mobile Phone: [phone] Relation: Son  Code Status:  Goals of Care: Advanced Directive information    07/05/2022    4:50 PM  Advanced Directives  Does Patient Have a Medical Advance Directive? Yes  Type of Paramedic of [place];Living will;Out of facility DNR (pink MOST or yellow form)  Does patient want to make changes to medical advance directive? No - Patient declined  Copy of [place] in Chart? Yes - validated most recent copy scanned in chart (See row information)      No chief complaint on file.   HPI: Patient is a 88 y.o. male seen today for admission to.[facility] after a brief hospital stay for encephalopathy superimposed on late onset Alzheimer's.  Encephalopathy was felt due to COVID-19 infection.  Also has a history of AV block with pacemaker, diabetes mellitus, paroxysmal atrial fibrillation. He was sent to the hospital from memory care with increased confusion and lethargy.  Chest x-ray showed no pneumonia.  He was given gentle IV fluids is started on antiviral molnupiravir.  After demonstrated stability in the hospital he is readmitted to memory care but will now be followed by hospice.  Past Medical History:  Diagnosis Date   Allergic rhinitis, cause unspecified    BPH (benign prostatic hyperplasia)    Carotid artery stenosis, asymptomatic    1-39% by dopplers 09/2016 followed by  Dr. Oneida Alar   Cataracts, bilateral    Chronic kidney disease (CKD), stage III (moderate) ([place])    Complete heart block ([place]) 01/23/04   s/p Medtronic PPM implanted by Dr Leonia Reeves   COPD (chronic obstructive pulmonary disease) ([place])    Diabetes mellitus    Glaucoma    Hyperlipidemia    Hypertension    Insomnia due to medical condition 08/10/2014   Kidney stones    PAF (paroxysmal atrial fibrillation) ([place]) 10/09/2014   Noted on pacer check with 88 mode switches and longest episode >1 hour.  Now on Apixiban for [place] score of 5   Past Surgical History:  Procedure Laterality Date   PACEMAKER INSERTION  2005, 04/16/12   MDT implanted by Dr Leonia Reeves with generator chnage (MDT Adapta L) by Dr Rayann Heman 04/16/12   PERMANENT PACEMAKER GENERATOR CHANGE N/A 04/16/2012   Procedure: PERMANENT PACEMAKER GENERATOR CHANGE;  Surgeon: Thompson Grayer, MD;  Location: [facility] CATH LAB;  Service: Cardiovascular;  Laterality: N/A;   RIGHT/LEFT HEART CATH AND CORONARY ANGIOGRAPHY N/A 10/18/2020   Procedure: RIGHT/LEFT HEART CATH AND CORONARY ANGIOGRAPHY;  Surgeon: Troy Sine, MD;  Location: [facility] CV LAB;  Service: Cardiovascular;  Laterality: N/A;    reports that he quit smoking about 23 years ago. His smoking use included cigarettes. He has a 25.00 pack-year smoking history. He has never used smokeless tobacco. He reports that he does not currently use alcohol. He reports that he does not use drugs. Social History   Socioeconomic History   Marital status: Married    Spouse  name: [name]   Number of children: 2   Years of education: BS   Highest education level: Not on file  Occupational History   Occupation: retired  Tobacco Use   Smoking status: Former    Packs/day: 1.00    Years: 25.00    Total pack years: 25.00    Types: Cigarettes    Quit date: 09/16/1998    Years since quitting: 23.8   Smokeless tobacco: Never  Vaping Use   Vaping Use: Never used  Substance and Sexual Activity   Alcohol use: Not  Currently    Comment: 1-2 a month glasses of wine   Drug use: No   Sexual activity: Not on file  Other Topics Concern   Not on file  Social History Narrative   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   Patient is married with 2 children.   Patient is right handed.   Patient has BS degree.   Patient drinks 1 cup daily.         Social Determinants of Health   Financial Resource Strain: Not on file  Food Insecurity: Not on file  Transportation Needs: Not on file  Physical Activity: Not on file  Stress: Not on file  Social Connections: Not on file  Intimate Partner Violence: Not on file    Functional Status Survey:    Family History  Problem Relation Age of Onset   Heart disease Father    Heart attack Father    Alzheimer's disease Sister     Health Maintenance  Topic Date Due   Pneumonia Vaccine 82+ Years old (1 - PCV) Never done   FOOT EXAM  Never done   OPHTHALMOLOGY EXAM  Never done   TETANUS/TDAP  Never done   COVID-19 Vaccine (4 - Mixed Product risk series) 08/15/2021   INFLUENZA VACCINE  04/16/2022   HEMOGLOBIN A1C  12/29/2022   Zoster Vaccines- Shingrix  Completed   HPV VACCINES  Aged Out    Allergies  Allergen Reactions   Penicillins Rash and Other (See Comments)    Childhood allergy    Penicillin G Benzathine Rash    Outpatient Encounter Medications as of 07/09/2022  Medication Sig   acetaminophen (TYLENOL) 325 MG tablet Take 2 tablets (650 mg total) by mouth every 6 (six) hours as needed for mild pain or headache.   apixaban (ELIQUIS) 2.5 MG TABS tablet TAKE ONE TABLET BY MOUTH TWICE DAILY (Patient taking differently: Take 2.5 mg by mouth 2 (two) times daily.)   atorvastatin (LIPITOR) 80 MG tablet TAKE ONE TABLET BY MOUTH EVERY EVENING (Patient taking differently: Take 80 mg by mouth daily.)   divalproex (DEPAKOTE SPRINKLE) 125 MG capsule Take 250 mg by mouth 2 (two) times daily.   docusate sodium (COLACE) 100 MG capsule Take 100 mg by  mouth 2 (two) times daily.   haloperidol (HALDOL) 2 MG tablet Take 2 mg by mouth as needed for agitation (Or anxiety). *Give either the tablet or IM. Can not be given together.*   haloperidol lactate (HALDOL) 5 MG/ML injection Inject 2 mg into the muscle as needed (For agitation or anxiety). *Give either the tablet or IM. Can not be given together.*   isosorbide mononitrate (IMDUR) 30 MG 24 hr tablet TAKE 1/2 TABLET BY MOUTH EVERY MORNING (Patient taking differently: Take 15 mg by mouth daily.)   lactose free nutrition (BOOST) LIQD Take 237 mLs by mouth 2 (two) times daily between meals.   levothyroxine (SYNTHROID) 75 MCG tablet  Take 75 mcg by mouth daily before breakfast.   LORazepam (ATIVAN) 2 MG/ML injection Inject 2 mg into the muscle every 8 (eight) hours as needed for anxiety (Agitation). Give either the gel or IM. Cannot be given together.   memantine (NAMENDA) 10 MG tablet Take 1 tablet (10 mg total) by mouth 2 (two) times daily.   nitroGLYCERIN (NITROSTAT) 0.4 MG SL tablet Place 1 tablet (0.4 mg total) under the tongue every 5 (five) minutes as needed for chest pain.   risperiDONE (RISPERDAL) 0.25 MG tablet Take 1 tablet (0.25 mg total) by mouth 2 (two) times daily.   No facility-administered encounter medications on file as of 07/09/2022.    Review of Systems  Unable to perform ROS: Dementia  Constitutional:  Positive for activity change and appetite change.  Respiratory:  Positive for cough and choking.   Psychiatric/Behavioral:  Positive for behavioral problems and confusion.   All other systems reviewed and are negative.   There were no vitals filed for this visit. There is no height or weight on file to calculate BMI. Physical Exam Vitals and nursing note reviewed.  Constitutional:      Appearance: He is ill-appearing.  HENT:     Mouth/Throat:     Mouth: Mucous membranes are moist.  Eyes:     Extraocular Movements: Extraocular movements intact.     Conjunctiva/sclera:  Conjunctivae normal.     Pupils: Pupils are equal, round, and reactive to light.  Cardiovascular:     Rate and Rhythm: Normal rate. Rhythm irregular.  Pulmonary:     Effort: Pulmonary effort is normal.     Breath sounds: Rhonchi present.  Abdominal:     General: Abdomen is flat. Bowel sounds are normal.     Palpations: Abdomen is soft.  Neurological:     General: No focal deficit present.     Comments: Patient does not respond coherently to any questions and remains very confused Appetite is poor.  At my visit he had just been served breakfast but was not eating     Labs reviewed: Basic Metabolic Panel: Recent Labs    06/29/22 2006 07/01/22 0238 07/02/22 0242  NA 143 140 142  K 4.5 3.9 3.6  CL 111 108 108  CO2 21* 21* 25  GLUCOSE 209* 199* 112*  BUN 22 34* 27*  CREATININE 1.67* 1.76* 1.65*  CALCIUM 8.8* 8.5* 8.8*   Liver Function Tests: Recent Labs    06/29/22 1545  AST 25  ALT 29  ALKPHOS 77  BILITOT 0.5  PROT 6.2*  ALBUMIN 3.6   No results for input(s): "LIPASE", "AMYLASE" in the last 8760 hours. No results for input(s): "AMMONIA" in the last 8760 hours. CBC: Recent Labs    06/29/22 1545 06/29/22 1605 07/01/22 0238 07/02/22 0242  WBC 6.4  --  6.4 4.8  NEUTROABS 4.8  --   --   --   HGB 14.4 9.9* 14.3 14.9  HCT 44.5 29.0* 43.3 45.7  MCV 82.9  --  81.5 81.5  PLT 222  --  213 229   Cardiac Enzymes: No results for input(s): "CKTOTAL", "CKMB", "CKMBINDEX", "TROPONINI" in the last 8760 hours. BNP: Invalid input(s): "POCBNP" Lab Results  Component Value Date   HGBA1C 9.5 (H) 06/29/2022   No results found for: "TSH" No results found for: "VITAMINB12" No results found for: "FOLATE" Lab Results  Component Value Date   FERRITIN 103 07/02/2022    Imaging and Procedures obtained prior to SNF admission: CT Head Wo  Contrast  Result Date: 06/29/2022 CLINICAL DATA:  Increased lethargy and weakness, COVID EXAM: CT HEAD WITHOUT CONTRAST TECHNIQUE:  Contiguous axial images were obtained from the base of the skull through the vertex without intravenous contrast. RADIATION DOSE REDUCTION: This exam was performed according to the departmental dose-optimization program which includes automated exposure control, adjustment of the mA and/or kV according to patient size and/or use of iterative reconstruction technique. COMPARISON:  05/17/2021 FINDINGS: Brain: No evidence of acute infarction, hemorrhage, mass, mass effect, or midline shift. No hydrocephalus or extra-axial fluid collection. Previously noted right cerebral convexity hematoma has resolved. Periventricular white matter changes, likely the sequela of chronic small vessel ischemic disease. Vascular: No hyperdense vessel. Skull: Normal. Negative for fracture or focal lesion. Sinuses/Orbits: Mild mucosal thickening in the ethmoid air cells. Status post bilateral lens replacements. Other: The mastoid air cells are well aerated. IMPRESSION: No acute intracranial process. A subdural hematoma noted on the 05/17/2021 head CT has resolved. Electronically Signed   By: Merilyn Baba M.D.   On: 06/29/2022 17:25   DG Chest Port 1 View  Result Date: 06/29/2022 CLINICAL DATA:  COVID-19 infection. Hypoxia. Increased lethargy and weakness since yesterday. Rhonchi. EXAM: PORTABLE CHEST 1 VIEW COMPARISON:  05/17/2021 FINDINGS: Normal sized heart. Aortic arch calcifications. Clear lungs with normal vascularity. Stable right subclavian bipolar pacemaker leads. Old, healed right rib fractures. IMPRESSION: No acute abnormality. Electronically Signed   By: Claudie Revering M.D.   On: 06/29/2022 15:51    Assessment/Plan 1. Chronic renal impairment, stage 3a ([place]) Renal studies show creatinine of 1.6-1.7 with BUN in the 30s.  Probably multifactorial related to diabetes and now poor p.o. intake  2. COVID-19 virus infection Was treated with antiviral infection in the hospital and there was no pneumonia  3. Diabetes mellitus  due to underlying condition with other diabetic neurological complication ([place]) No longer being treated.  With poor p.o. intake would be surprised if sugars do not range normal  4. Late onset Alzheimer's dementia with behavioral disturbance ([place]) Dementia is advanced.  Patient unable to carry on any meaningful conversation    Family/ staff Communication:   Labs/tests ordered:  Lillette Boxer. Sabra Heck, [place] [address] [place], [place] Office [phone]

## 2022-07-15 ENCOUNTER — Non-Acute Institutional Stay (SKILLED_NURSING_FACILITY): Payer: PPO | Admitting: Nurse Practitioner

## 2022-07-15 ENCOUNTER — Encounter: Payer: Self-pay | Admitting: Nurse Practitioner

## 2022-07-15 DIAGNOSIS — N1831 Chronic kidney disease, stage 3a: Secondary | ICD-10-CM

## 2022-07-15 DIAGNOSIS — E7800 Pure hypercholesterolemia, unspecified: Secondary | ICD-10-CM | POA: Diagnosis not present

## 2022-07-15 DIAGNOSIS — Z950 Presence of cardiac pacemaker: Secondary | ICD-10-CM

## 2022-07-15 DIAGNOSIS — I6523 Occlusion and stenosis of bilateral carotid arteries: Secondary | ICD-10-CM

## 2022-07-15 DIAGNOSIS — I10 Essential (primary) hypertension: Secondary | ICD-10-CM | POA: Diagnosis not present

## 2022-07-15 DIAGNOSIS — I5020 Unspecified systolic (congestive) heart failure: Secondary | ICD-10-CM

## 2022-07-15 DIAGNOSIS — F02818 Dementia in other diseases classified elsewhere, unspecified severity, with other behavioral disturbance: Secondary | ICD-10-CM

## 2022-07-15 DIAGNOSIS — G301 Alzheimer's disease with late onset: Secondary | ICD-10-CM

## 2022-07-15 DIAGNOSIS — E0849 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: Secondary | ICD-10-CM | POA: Diagnosis not present

## 2022-07-15 DIAGNOSIS — E039 Hypothyroidism, unspecified: Secondary | ICD-10-CM

## 2022-07-15 NOTE — Assessment & Plan Note (Signed)
s/p PPM for CHB

## 2022-07-15 NOTE — Assessment & Plan Note (Signed)
Bun/creat 27/1.65 07/02/22 

## 2022-07-15 NOTE — Assessment & Plan Note (Signed)
taking Levothyroxine, TSH 2.82 06/13/22 

## 2022-07-15 NOTE — Assessment & Plan Note (Signed)
Blood pressure is controlled, off Vasartan

## 2022-07-15 NOTE — Assessment & Plan Note (Signed)
on diet, poor oral intake,  Hgb a1c 9.5 06/29/22

## 2022-07-15 NOTE — Assessment & Plan Note (Signed)
takes Atorvastatin, LDL 60 06/13/22 

## 2022-07-15 NOTE — Progress Notes (Unsigned)
Location:   SNF [facility] Room Number: 104 Place of Service:  SNF (31) Provider: Arna Snipe Kemari Mares NP  Ferrin Liebig X, NP  Patient Care Team: Bayne Fosnaugh X, NP as PCP - General (Internal Medicine) Quintella Reichert, MD as PCP - Cardiology (Cardiology) Renford Dills, MD (Internal Medicine)  Extended Emergency Contact Information Primary Emergency Contact: Beidleman,[name] Address: [address]          [place], [place] [address] [place] Home Phone: [phone] Mobile Phone: [phone] Relation: Spouse Secondary Emergency Contact: Rhodes,[name] Mobile Phone: [phone] Relation: Son  Code Status:  DNR Goals of care: Advanced Directive information    07/05/2022    4:50 PM  Advanced Directives  Does Patient Have a Medical Advance Directive? Yes  Type of Estate agent of [place];Living will;Out of facility DNR (pink MOST or yellow form)  Does patient want to make changes to medical advance directive? No - Patient declined  Copy of Healthcare Power of Attorney in Chart? Yes - validated most recent copy scanned in chart (See row information)     Chief Complaint  Patient presents with  . Medical Management of Chronic Issues    HPI:  Pt is a 88 y.o. male seen today for medical management of chronic diseases.       Dysphagia III diet             Alzheimer's dementia,  off  Donepezil, Memantine, on Risperdal, Depakote, prn Haldol, prn Lorazepam, followed by Neurology, Hospice for supportive care, MMSE 2/30 06/07/22             Afib followed by Cardiology, heart rate is in control. on Eliquis             CAD, on Statin, Isosorbide, prn NTG             CHFrEF, off Farxiga, EF 30-35%             CHB s/p PPM             T2DM, on diet, Hgb a1c 9.5 06/29/22             Hypothyroidism, taking Levothyroxine, TSH 2.82 06/13/22             Hyperlipidemia, takes Atorvastatin, LDL 60 06/13/22             HTN, off Valsartan             CKD chronic,  Bun/creat 27/1.65 07/02/22  Past Medical History:  Diagnosis Date  . Allergic rhinitis, cause unspecified   . BPH (benign prostatic hyperplasia)   . Carotid artery stenosis, asymptomatic    1-39% by dopplers 09/2016 followed by Dr. Darrick Penna  . Cataracts, bilateral   . Chronic kidney disease (CKD), stage III (moderate) (H[facility])   . Complete heart block ([facility]) 01/23/04   s/p Medtronic PPM implanted by Dr Amil Amen  . COPD (chronic obstructive pulmonary disease) ([facility])   . Diabetes mellitus   . Glaucoma   . Hyperlipidemia   . Hypertension   . 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 Kidney stones   . PAF (paroxysmal atrial fibrillation) ([facility]) 10/09/2014   Noted on pacer check with 88 mode switches and longest episode >1 hour.  Now on Apixiban for CHADS2VASC score of 5   Past Surgical History:  Procedure Laterality Date  . PACEMAKER INSERTION  2005, 04/16/12   MDT implanted by Dr Amil Amen with generator chnage (MDT Adapta L) by Dr  Allred 04/16/12  . PERMANENT PACEMAKER GENERATOR CHANGE N/A 04/16/2012   Procedure: PERMANENT PACEMAKER GENERATOR CHANGE;  Surgeon: Hillis Range, MD;  Location: [facility] CATH LAB;  Service: Cardiovascular;  Laterality: N/A;  . RIGHT/LEFT HEART CATH AND CORONARY ANGIOGRAPHY N/A 10/18/2020   Procedure: RIGHT/LEFT HEART CATH AND CORONARY ANGIOGRAPHY;  Surgeon: Lennette Bihari, MD;  Location: [facility] INVASIVE CV LAB;  Service: Cardiovascular;  Laterality: N/A;    Allergies  Allergen Reactions  . Penicillins Rash and Other (See Comments)    Childhood allergy   . Penicillin G Benzathine Rash    Allergies as of 07/15/2022       Reactions   Penicillins Rash, Other (See Comments)   Childhood allergy    Penicillin G Benzathine Rash        Medication List        Accurate as of July 15, 2022 11:59 PM. If you have any questions, ask your nurse or doctor.          acetaminophen 325 MG tablet Commonly known as: TYLENOL Take 2 tablets (650 mg total) by mouth every 6 (six)  hours as needed for mild pain or headache.   atorvastatin 80 MG tablet Commonly known as: LIPITOR TAKE ONE TABLET BY MOUTH EVERY EVENING What changed: when to take this   divalproex 125 MG capsule Commonly known as: DEPAKOTE SPRINKLE Take 250 mg by mouth 2 (two) times daily.   docusate sodium 100 MG capsule Commonly known as: COLACE Take 100 mg by mouth 2 (two) times daily.   Eliquis 2.5 MG Tabs tablet Generic drug: apixaban TAKE ONE TABLET BY MOUTH TWICE DAILY What changed: how much to take   haloperidol 2 MG tablet Commonly known as: HALDOL Take 2 mg by mouth as needed for agitation (Or anxiety). *Give either the tablet or IM. Can not be given together.*   haloperidol lactate 5 MG/ML injection Commonly known as: HALDOL Inject 2 mg into the muscle as needed (For agitation or anxiety). *Give either the tablet or IM. Can not be given together.*   isosorbide mononitrate 30 MG 24 hr tablet Commonly known as: IMDUR TAKE 1/2 TABLET BY MOUTH EVERY MORNING What changed: when to take this   lactose free nutrition Liqd Take 237 mLs by mouth 2 (two) times daily between meals.   levothyroxine 75 MCG tablet Commonly known as: SYNTHROID Take 75 mcg by mouth daily before breakfast.   LORazepam 2 MG/ML injection Commonly known as: ATIVAN Inject 2 mg into the muscle every 8 (eight) hours as needed for anxiety (Agitation). Give either the gel or IM. Cannot be given together.   memantine 10 MG tablet Commonly known as: Namenda Take 1 tablet (10 mg total) by mouth 2 (two) times daily.   nitroGLYCERIN 0.4 MG SL tablet Commonly known as: NITROSTAT Place 1 tablet (0.4 mg total) under the tongue every 5 (five) minutes as needed for chest pain.   risperiDONE 0.25 MG tablet Commonly known as: RISPERDAL Take 1 tablet (0.25 mg total) by mouth 2 (two) times daily.        Review of Systems  Unable to perform ROS: Dementia    Immunization History  Administered Date(s) Administered   . Influenza-Unspecified 07/04/2014  . Moderna SARS-COV2 Booster Vaccination 07/25/2020, 02/13/2021  . Moderna Sars-Covid-2 Vaccination 09/20/2019, 10/18/2019  . Research officer, trade [employer] 72yrs & up 06/20/2021  . Zoster Recombinat (Shingrix) 11/06/2017, 03/28/2018   Pertinent  Health Maintenance Due  Topic Date Due  . FOOT EXAM  Never done  . OPHTHALMOLOGY EXAM  Never done  . INFLUENZA VACCINE  04/16/2022  . HEMOGLOBIN A1C  12/29/2022      07/01/2022    8:00 PM 07/02/2022    1:00 PM 07/02/2022    7:52 PM 07/03/2022    8:00 AM 07/05/2022    4:50 PM  Fall Risk  Falls in the past year?     0  Was there an injury with Fall?     0  Fall Risk Category Calculator     0  Fall Risk Category     Low  Patient Fall Risk Level High fall risk High fall risk High fall risk High fall risk High fall risk  Patient at Risk for Falls Due to     History of fall(s)  Fall risk Follow up     Falls evaluation completed   Functional Status Survey:    Vitals:   07/15/22 1050  BP: 122/68  Pulse: 64  Resp: 20  Temp: (!) 97.3 F (36.3 C)  SpO2: 95%  Weight: 138 lb (62.6 kg)   Body mass index is 22.27 kg/m. Physical Exam Vitals and nursing note reviewed.  Constitutional:      Appearance: Normal appearance.  HENT:     Head: Normocephalic and atraumatic.     Nose: Nose normal.     Mouth/Throat:     Mouth: Mucous membranes are moist.  Eyes:     Extraocular Movements: Extraocular movements intact.     Conjunctiva/sclera: Conjunctivae normal.     Pupils: Pupils are equal, round, and reactive to light.  Cardiovascular:     Rate and Rhythm: Normal rate and regular rhythm.     Heart sounds: No murmur heard. Pulmonary:     Effort: Pulmonary effort is normal.     Breath sounds: No rales.  Abdominal:     General: Bowel sounds are normal.     Palpations: Abdomen is soft.     Tenderness: There is no abdominal tenderness.  Genitourinary:    Comments: External  hemorrhoids Musculoskeletal:     Cervical back: Normal range of motion and neck supple.     Right lower leg: No edema.     Left lower leg: No edema.  Skin:    General: Skin is warm and dry.  Neurological:     General: No focal deficit present.     Mental Status: He is alert. Mental status is at baseline.     Gait: Gait abnormal.     Comments: Oriented to person  Psychiatric:     Comments: Flat affect, non cooperative.     Labs reviewed: Recent Labs    06/29/22 2006 07/01/22 0238 07/02/22 0242  NA 143 140 142  K 4.5 3.9 3.6  CL 111 108 108  CO2 21* 21* 25  GLUCOSE 209* 199* 112*  BUN 22 34* 27*  CREATININE 1.67* 1.76* 1.65*  CALCIUM 8.8* 8.5* 8.8*   Recent Labs    06/29/22 1545  AST 25  ALT 29  ALKPHOS 77  BILITOT 0.5  PROT 6.2*  ALBUMIN 3.6   Recent Labs    06/29/22 1545 06/29/22 1605 07/01/22 0238 07/02/22 0242  WBC 6.4  --  6.4 4.8  NEUTROABS 4.8  --   --   --   HGB 14.4 9.9* 14.3 14.9  HCT 44.5 29.0* 43.3 45.7  MCV 82.9  --  81.5 81.5  PLT 222  --  213 229   No results found for: "TSH"  Lab Results  Component Value Date   HGBA1C 9.5 (H) 06/29/2022   Lab Results  Component Value Date   CHOL 128 12/16/2016   HDL 55 12/16/2016   LDLCALC 58 12/16/2016   TRIG 97 06/29/2022   CHOLHDL 2.3 12/16/2016    Significant Diagnostic Results in last 30 days:  CT Head Wo Contrast  Result Date: 06/29/2022 CLINICAL DATA:  Increased lethargy and weakness, COVID EXAM: CT HEAD WITHOUT CONTRAST TECHNIQUE: Contiguous axial images were obtained from the base of the skull through the vertex without intravenous contrast. RADIATION DOSE REDUCTION: This exam was performed according to the departmental dose-optimization program which includes automated exposure control, adjustment of the mA and/or kV according to patient size and/or use of iterative reconstruction technique. COMPARISON:  05/17/2021 FINDINGS: Brain: No evidence of acute infarction, hemorrhage, mass, mass  effect, or midline shift. No hydrocephalus or extra-axial fluid collection. Previously noted right cerebral convexity hematoma has resolved. Periventricular white matter changes, likely the sequela of chronic small vessel ischemic disease. Vascular: No hyperdense vessel. Skull: Normal. Negative for fracture or focal lesion. Sinuses/Orbits: Mild mucosal thickening in the ethmoid air cells. Status post bilateral lens replacements. Other: The mastoid air cells are well aerated. IMPRESSION: No acute intracranial process. A subdural hematoma noted on the 05/17/2021 head CT has resolved. Electronically Signed   By: Wiliam Ke M.D.   On: 06/29/2022 17:25   DG Chest Port 1 View  Result Date: 06/29/2022 CLINICAL DATA:  COVID-19 infection. Hypoxia. Increased lethargy and weakness since yesterday. Rhonchi. EXAM: PORTABLE CHEST 1 VIEW COMPARISON:  05/17/2021 FINDINGS: Normal sized heart. Aortic arch calcifications. Clear lungs with normal vascularity. Stable right subclavian bipolar pacemaker leads. Old, healed right rib fractures. IMPRESSION: No acute abnormality. Electronically Signed   By: Beckie Salts M.D.   On: 06/29/2022 15:51    Assessment/Plan  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 (H[facility]) on diet, poor oral intake,  Hgb a1c 9.5 06/29/22  Hypothyroidism  taking Levothyroxine, TSH 2.82 06/13/22  Hypercholesterolemia without hypertriglyceridemia  takes Atorvastatin, LDL 60 06/13/22  Hypertension Blood pressure is controlled, off Vasartan  Chronic renal insufficiency, stage III (moderate) (HCC) Bun/creat 27/1.65 07/02/22  Pacemaker-Medtronic s/p PPM for CHB  HFrEF (heart failure with reduced ejection fraction) (HCC) off Farxiga, EF 30-35%  Carotid stenosis, bilateral on Statin, Isosorbide, prn NTG  Late onset Alzheimer's dementia with behavioral disturbance (H[facility])  off  Donepezil, Memantine, on Risperdal, Depakote, prn Haldol, prn  Lorazepam, followed by Neurology, Hospice for supportive care, MMSE 2/30 06/07/22   Family/ staff Communication: plan of care reviewed with the patient and charge nurse.   Labs/tests ordered:  none  Time spend 25 minutes.

## 2022-07-15 NOTE — Assessment & Plan Note (Signed)
off  Donepezil, Memantine, on Risperdal, Depakote, prn Haldol, prn Lorazepam, followed by Neurology, Hospice for supportive care, MMSE 2/30 06/07/22

## 2022-07-15 NOTE — Assessment & Plan Note (Signed)
off Farxiga, EF 30-35% 

## 2022-07-15 NOTE — Assessment & Plan Note (Signed)
on Statin, Isosorbide, prn NTG 

## 2022-08-05 ENCOUNTER — Encounter: Payer: Self-pay | Admitting: Nurse Practitioner

## 2022-08-05 DIAGNOSIS — K5901 Slow transit constipation: Secondary | ICD-10-CM | POA: Insufficient documentation

## 2022-08-13 ENCOUNTER — Telehealth: Payer: Self-pay

## 2022-08-13 DIAGNOSIS — I10 Essential (primary) hypertension: Secondary | ICD-10-CM | POA: Diagnosis not present

## 2022-08-13 LAB — BASIC METABOLIC PANEL
BUN: 22 — AB (ref 4–21)
CO2: 27 — AB (ref 13–22)
Chloride: 109 — AB (ref 99–108)
Creatinine: 1.4 — AB (ref 0.6–1.3)
Glucose: 184
Potassium: 3.8 mEq/L (ref 3.5–5.1)
Sodium: 144 (ref 137–147)

## 2022-08-13 LAB — COMPREHENSIVE METABOLIC PANEL
Albumin: 4 (ref 3.5–5.0)
Calcium: 8.9 (ref 8.7–10.7)
Globulin: 2.2
eGFR: 48

## 2022-08-13 LAB — CBC AND DIFFERENTIAL
HCT: 44 (ref 41–53)
Hemoglobin: 14 (ref 13.5–17.5)
Neutrophils Absolute: 2690
Platelets: 246 10*3/uL (ref 150–400)
WBC: 5.5

## 2022-08-13 LAB — HEPATIC FUNCTION PANEL
ALT: 19 U/L (ref 10–40)
AST: 15 (ref 14–40)
Alkaline Phosphatase: 80 (ref 25–125)
Bilirubin, Total: 0.4

## 2022-08-13 LAB — CBC: RBC: 5.15 — AB (ref 3.87–5.11)

## 2022-08-13 NOTE — Telephone Encounter (Signed)
Pharmacy send a rx for Lorazepam 1mg /ml topical gel and medication not on active medication list. Please advised

## 2022-08-19 ENCOUNTER — Non-Acute Institutional Stay (SKILLED_NURSING_FACILITY): Admitting: Nurse Practitioner

## 2022-08-19 DIAGNOSIS — E7800 Pure hypercholesterolemia, unspecified: Secondary | ICD-10-CM

## 2022-08-19 DIAGNOSIS — E039 Hypothyroidism, unspecified: Secondary | ICD-10-CM | POA: Diagnosis not present

## 2022-08-19 DIAGNOSIS — I5020 Unspecified systolic (congestive) heart failure: Secondary | ICD-10-CM | POA: Diagnosis not present

## 2022-08-19 DIAGNOSIS — I10 Essential (primary) hypertension: Secondary | ICD-10-CM

## 2022-08-19 DIAGNOSIS — I480 Paroxysmal atrial fibrillation: Secondary | ICD-10-CM | POA: Diagnosis not present

## 2022-08-19 DIAGNOSIS — R627 Adult failure to thrive: Secondary | ICD-10-CM

## 2022-08-19 DIAGNOSIS — F02818 Dementia in other diseases classified elsewhere, unspecified severity, with other behavioral disturbance: Secondary | ICD-10-CM | POA: Diagnosis not present

## 2022-08-19 DIAGNOSIS — G301 Alzheimer's disease with late onset: Secondary | ICD-10-CM

## 2022-08-19 DIAGNOSIS — I442 Atrioventricular block, complete: Secondary | ICD-10-CM | POA: Diagnosis not present

## 2022-08-19 DIAGNOSIS — E1121 Type 2 diabetes mellitus with diabetic nephropathy: Secondary | ICD-10-CM

## 2022-08-20 ENCOUNTER — Encounter: Payer: Self-pay | Admitting: Nurse Practitioner

## 2022-08-20 ENCOUNTER — Ambulatory Visit (INDEPENDENT_AMBULATORY_CARE_PROVIDER_SITE_OTHER)

## 2022-08-20 DIAGNOSIS — I420 Dilated cardiomyopathy: Secondary | ICD-10-CM | POA: Diagnosis not present

## 2022-08-20 DIAGNOSIS — R627 Adult failure to thrive: Secondary | ICD-10-CM | POA: Insufficient documentation

## 2022-08-20 LAB — CUP PACEART REMOTE DEVICE CHECK
Battery Impedance: 3841 Ohm
Battery Remaining Longevity: 9 mo
Battery Voltage: 2.66 V
Brady Statistic AP VP Percent: 93 %
Brady Statistic AP VS Percent: 0 %
Brady Statistic AS VP Percent: 7 %
Brady Statistic AS VS Percent: 0 %
Date Time Interrogation Session: 20231205112133
Implantable Lead Connection Status: 753985
Implantable Lead Connection Status: 753985
Implantable Lead Implant Date: 20050509
Implantable Lead Implant Date: 20050509
Implantable Lead Location: 753859
Implantable Lead Location: 753860
Implantable Lead Model: 5076
Implantable Lead Model: 5092
Implantable Pulse Generator Implant Date: 20130801
Lead Channel Impedance Value: 374 Ohm
Lead Channel Impedance Value: 544 Ohm
Lead Channel Pacing Threshold Amplitude: 0.5 V
Lead Channel Pacing Threshold Amplitude: 1.125 V
Lead Channel Pacing Threshold Pulse Width: 0.4 ms
Lead Channel Pacing Threshold Pulse Width: 0.4 ms
Lead Channel Setting Pacing Amplitude: 2 V
Lead Channel Setting Pacing Amplitude: 2.75 V
Lead Channel Setting Pacing Pulse Width: 0.4 ms
Lead Channel Setting Sensing Sensitivity: 4 mV
Zone Setting Status: 755011
Zone Setting Status: 755011

## 2022-08-20 NOTE — Assessment & Plan Note (Signed)
on diet, Hgb a1c 9.5 06/29/22, Bun/creat 27/1.65 07/02/22, will monitor fasting CBG qam ac breakfast, may consider Insulin

## 2022-08-20 NOTE — Assessment & Plan Note (Addendum)
on Statin, Isosorbide, prn NTG, s/p PPM

## 2022-08-20 NOTE — Assessment & Plan Note (Signed)
off  Donepezil, Memantine, on Risperdal, Depakote, prn Lorazepam, followed by Neurology, Hospice for supportive care, MMSE 2/30 06/07/22 

## 2022-08-20 NOTE — Assessment & Plan Note (Signed)
off Farxiga, EF 30-35%

## 2022-08-20 NOTE — Assessment & Plan Note (Signed)
takes Atorvastatin, LDL 60 06/13/22 

## 2022-08-20 NOTE — Assessment & Plan Note (Signed)
Blood pressure is controlled, off meds.  

## 2022-08-20 NOTE — Assessment & Plan Note (Signed)
followed by Cardiology, heart rate is in control. on Eliquis 

## 2022-08-20 NOTE — Assessment & Plan Note (Signed)
weight loss, followed by dietary, may consider Mirtazapine.

## 2022-08-20 NOTE — Assessment & Plan Note (Signed)
taking Levothyroxine, TSH 2.82 06/13/22 

## 2022-08-20 NOTE — Progress Notes (Signed)
Location:   SNF [facility] Room Number: 104 Place of Service:  SNF (31) Provider: Arna Snipe Norma Montemurro NP  Fleet Higham X, NP  Patient Care Team: Victoriah Wilds X, NP as PCP - General (Internal Medicine) Quintella Reichert, MD as PCP - Cardiology (Cardiology) Renford Dills, MD (Internal Medicine)  Extended Emergency Contact Information Primary Emergency Contact: Rud,[name] Address: [address]          [place], [place] [address] [place] Home Phone: [phone] Mobile Phone: [phone] Relation: Spouse Secondary Emergency Contact: Tufte,[name] Mobile Phone: [phone] Relation: Son  Code Status:  DNR Goals of care: Advanced Directive information    07/05/2022    4:50 PM  Advanced Directives  Does Patient Have a Medical Advance Directive? Yes  Type of Estate agent of [place];Living will;Out of facility DNR (pink MOST or yellow form)  Does patient want to make changes to medical advance directive? No - Patient declined  Copy of Healthcare Power of Attorney in Chart? Yes - validated most recent copy scanned in chart (See row information)     Chief Complaint  Patient presents with   Medical Management of Chronic Issues    HPI:  Pt is a 88 y.o. male seen today for medical management of chronic diseases.     Dysphagia III diet             Alzheimer's dementia,  off  Donepezil, Memantine, on Risperdal, Depakote, prn Lorazepam, followed by Neurology, Hospice for supportive care, MMSE 2/30 06/07/22  Adult Failure To Thrive, weight loss, followed by dietary             Afib followed by Cardiology, heart rate is in control. on Eliquis             CAD, on Statin, Isosorbide, prn NTG             CHFrEF, off Farxiga, EF 30-35%             CHB s/p PPM             T2DM, on diet, Hgb a1c 9.5 06/29/22             Hypothyroidism, taking Levothyroxine, TSH 2.82 06/13/22             Hyperlipidemia, takes Atorvastatin, LDL 60 06/13/22             HTN,  off Valsartan             CKD chronic, Bun/creat 27/1.65 07/02/22     Past Medical History:  Diagnosis Date   Allergic rhinitis, cause unspecified    BPH (benign prostatic hyperplasia)    Carotid artery stenosis, asymptomatic    1-39% by dopplers 09/2016 followed by Dr. Darrick Penna   Cataracts, bilateral    Chronic kidney disease (CKD), stage III (moderate) (HCC)    Complete heart block (HCC) 01/23/04   s/p Medtronic PPM implanted by Dr Amil Amen   COPD (chronic obstructive pulmonary disease) (HCC)    Diabetes mellitus    Glaucoma    Hyperlipidemia    Hypertension    Insomnia due to medical condition 08/10/2014   Kidney stones    PAF (paroxysmal atrial fibrillation) (HCC) 10/09/2014   Noted on pacer check with 88 mode switches and longest episode >1 hour.  Now on Apixiban for CHADS2VASC score of 5   Past Surgical History:  Procedure Laterality Date   PACEMAKER INSERTION  2005, 04/16/12   MDT implanted by Dr  Edmunds with generator chnage (MDT Adapta L) by Dr Johney Frame 04/16/12   PERMANENT PACEMAKER GENERATOR CHANGE N/A 04/16/2012   Procedure: PERMANENT PACEMAKER GENERATOR CHANGE;  Surgeon: Hillis Range, MD;  Location: [facility] CATH LAB;  Service: Cardiovascular;  Laterality: N/A;   RIGHT/LEFT HEART CATH AND CORONARY ANGIOGRAPHY N/A 10/18/2020   Procedure: RIGHT/LEFT HEART CATH AND CORONARY ANGIOGRAPHY;  Surgeon: Lennette Bihari, MD;  Location: [facility] INVASIVE CV LAB;  Service: Cardiovascular;  Laterality: N/A;    Allergies  Allergen Reactions   Penicillins Rash and Other (See Comments)    Childhood allergy    Penicillin G Benzathine Rash    Allergies as of 08/19/2022       Reactions   Penicillins Rash, Other (See Comments)   Childhood allergy    Penicillin G Benzathine Rash        Medication List        Accurate as of August 19, 2022 11:59 PM. If you have any questions, ask your nurse or doctor.          acetaminophen 325 MG tablet Commonly known as: TYLENOL Take 2 tablets (650 mg  total) by mouth every 6 (six) hours as needed for mild pain or headache.   atorvastatin 80 MG tablet Commonly known as: LIPITOR TAKE ONE TABLET BY MOUTH EVERY EVENING What changed: when to take this   divalproex 125 MG capsule Commonly known as: DEPAKOTE SPRINKLE Take 250 mg by mouth 2 (two) times daily.   docusate sodium 100 MG capsule Commonly known as: COLACE Take 100 mg by mouth 2 (two) times daily.   Eliquis 2.5 MG Tabs tablet Generic drug: apixaban TAKE ONE TABLET BY MOUTH TWICE DAILY What changed: how much to take   haloperidol 2 MG tablet Commonly known as: HALDOL Take 2 mg by mouth as needed for agitation (Or anxiety). *Give either the tablet or IM. Can not be given together.*   haloperidol lactate 5 MG/ML injection Commonly known as: HALDOL Inject 2 mg into the muscle as needed (For agitation or anxiety). *Give either the tablet or IM. Can not be given together.*   isosorbide mononitrate 30 MG 24 hr tablet Commonly known as: IMDUR TAKE 1/2 TABLET BY MOUTH EVERY MORNING What changed: when to take this   lactose free nutrition Liqd Take 237 mLs by mouth 2 (two) times daily between meals.   levothyroxine 75 MCG tablet Commonly known as: SYNTHROID Take 75 mcg by mouth daily before breakfast.   LORazepam 2 MG/ML injection Commonly known as: ATIVAN Inject 2 mg into the muscle every 8 (eight) hours as needed for anxiety (Agitation). Give either the gel or IM. Cannot be given together.   memantine 10 MG tablet Commonly known as: Namenda Take 1 tablet (10 mg total) by mouth 2 (two) times daily.   nitroGLYCERIN 0.4 MG SL tablet Commonly known as: NITROSTAT Place 1 tablet (0.4 mg total) under the tongue every 5 (five) minutes as needed for chest pain.   risperiDONE 0.25 MG tablet Commonly known as: RISPERDAL Take 1 tablet (0.25 mg total) by mouth 2 (two) times daily.        Review of Systems  Unable to perform ROS: Dementia    Immunization History   Administered Date(s) Administered   Influenza-Unspecified 07/04/2014   Moderna SARS-COV2 Booster Vaccination 07/25/2020, 02/13/2021   Moderna Sars-Covid-2 Vaccination 09/20/2019, 10/18/2019   Pfizer Covid-19 Vaccine Bivalent Booster 60yrs & up 06/20/2021   Zoster Recombinat (Shingrix) 11/06/2017, 03/28/2018   Pertinent  Health Maintenance Due  Topic Date Due   FOOT EXAM  Never done   OPHTHALMOLOGY EXAM  Never done   INFLUENZA VACCINE  04/16/2022   HEMOGLOBIN A1C  12/29/2022      07/01/2022    8:00 PM 07/02/2022    1:00 PM 07/02/2022    7:52 PM 07/03/2022    8:00 AM 07/05/2022    4:50 PM  Fall Risk  Falls in the past year?     0  Was there an injury with Fall?     0  Fall Risk Category Calculator     0  Fall Risk Category     Low  Patient Fall Risk Level High fall risk High fall risk High fall risk High fall risk High fall risk  Patient at Risk for Falls Due to     History of fall(s)  Fall risk Follow up     Falls evaluation completed   Functional Status Survey:    Vitals:   08/19/22 1255  BP: 130/64  Pulse: 68  Resp: 18  Temp: 97.9 F (36.6 C)  SpO2: 95%  Weight: 134 lb 12.8 oz (61.1 kg)   Body mass index is 21.76 kg/m. Physical Exam Vitals and nursing note reviewed.  Constitutional:      Appearance: Normal appearance.     Comments: Weigh loss about #4Ibs in the past month  HENT:     Head: Normocephalic and atraumatic.     Nose: Nose normal.     Mouth/Throat:     Mouth: Mucous membranes are moist.  Eyes:     Extraocular Movements: Extraocular movements intact.     Conjunctiva/sclera: Conjunctivae normal.     Pupils: Pupils are equal, round, and reactive to light.  Cardiovascular:     Rate and Rhythm: Normal rate and regular rhythm.     Heart sounds: No murmur heard. Pulmonary:     Effort: Pulmonary effort is normal.     Breath sounds: No rales.  Abdominal:     General: Bowel sounds are normal.     Palpations: Abdomen is soft.     Tenderness: There  is no abdominal tenderness.  Genitourinary:    Comments: External hemorrhoids Musculoskeletal:     Cervical back: Normal range of motion and neck supple.     Right lower leg: No edema.     Left lower leg: No edema.  Skin:    General: Skin is warm and dry.  Neurological:     General: No focal deficit present.     Mental Status: He is alert. Mental status is at baseline.     Gait: Gait abnormal.     Comments: Oriented to person  Psychiatric:     Comments: Flat affect, non cooperative.      Labs reviewed: Recent Labs    06/29/22 2006 07/01/22 0238 07/02/22 0242  NA 143 140 142  K 4.5 3.9 3.6  CL 111 108 108  CO2 21* 21* 25  GLUCOSE 209* 199* 112*  BUN 22 34* 27*  CREATININE 1.67* 1.76* 1.65*  CALCIUM 8.8* 8.5* 8.8*   Recent Labs    06/29/22 1545  AST 25  ALT 29  ALKPHOS 77  BILITOT 0.5  PROT 6.2*  ALBUMIN 3.6   Recent Labs    06/29/22 1545 06/29/22 1605 07/01/22 0238 07/02/22 0242  WBC 6.4  --  6.4 4.8  NEUTROABS 4.8  --   --   --   HGB 14.4 9.9* 14.3 14.9  HCT 44.5 29.0* 43.3 45.7  MCV 82.9  --  81.5 81.5  PLT 222  --  213 229   No results found for: "TSH" Lab Results  Component Value Date   HGBA1C 9.5 (H) 06/29/2022   Lab Results  Component Value Date   CHOL 128 12/16/2016   HDL 55 12/16/2016   LDLCALC 58 12/16/2016   TRIG 97 06/29/2022   CHOLHDL 2.3 12/16/2016    Significant Diagnostic Results in last 30 days:  No results found.  Assessment/Plan  Diabetic kidney on diet, Hgb a1c 9.5 06/29/22, Bun/creat 27/1.65 07/02/22, will monitor fasting CBG qam ac breakfast, may consider Insulin   Late onset Alzheimer's dementia with behavioral disturbance (HCC) off  Donepezil, Memantine, on Risperdal, Depakote, prn Lorazepam, followed by Neurology, Hospice for supportive care, MMSE 2/30 06/07/22  Adult failure to thrive weight loss, followed by dietary, may consider Mirtazapine.   PAF (paroxysmal atrial fibrillation) (HCC) followed by  Cardiology, heart rate is in control. on Eliquis  Atrioventricular block, complete (HCC)  on Statin, Isosorbide, prn NTG, s/p PPM  HFrEF (heart failure with reduced ejection fraction) (HCC) off Farxiga, EF 30-35%  Hypothyroidism taking Levothyroxine, TSH 2.82 06/13/22  Hypertension Blood pressure is controlled, off meds.    Family/ staff Communication: plan of care reviewed with the patient and charge nurse.   Labs/tests ordered:  none   Time spend 35 minutes .

## 2022-08-21 ENCOUNTER — Other Ambulatory Visit: Payer: Self-pay | Admitting: Adult Health

## 2022-08-21 MED ORDER — LORAZEPAM 2 MG/ML PO CONC: ORAL | 0 refills | Status: DC

## 2022-08-22 NOTE — Addendum Note (Signed)
Addended by: Maggie Schwalbe X on: 08/22/2022 01:33 PM   Modules accepted: Level of Service

## 2022-09-17 NOTE — Progress Notes (Signed)
Remote pacemaker transmission.   

## 2022-09-24 ENCOUNTER — Non-Acute Institutional Stay (SKILLED_NURSING_FACILITY): Admitting: Family Medicine

## 2022-09-24 DIAGNOSIS — N1831 Chronic kidney disease, stage 3a: Secondary | ICD-10-CM | POA: Diagnosis not present

## 2022-09-24 DIAGNOSIS — G301 Alzheimer's disease with late onset: Secondary | ICD-10-CM | POA: Diagnosis not present

## 2022-09-24 DIAGNOSIS — F02818 Dementia in other diseases classified elsewhere, unspecified severity, with other behavioral disturbance: Secondary | ICD-10-CM | POA: Diagnosis not present

## 2022-09-24 DIAGNOSIS — E1121 Type 2 diabetes mellitus with diabetic nephropathy: Secondary | ICD-10-CM

## 2022-09-24 DIAGNOSIS — R627 Adult failure to thrive: Secondary | ICD-10-CM | POA: Diagnosis not present

## 2022-09-24 DIAGNOSIS — E0849 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: Secondary | ICD-10-CM | POA: Diagnosis not present

## 2022-09-24 NOTE — Progress Notes (Signed)
Provider:  Jacalyn Lefevre, MD Location:      Place of Service:     PCP: Mast, Man X, NP Patient Care Team: Mast, Man X, NP as PCP - General (Internal Medicine) Quintella Reichert, MD as PCP - Cardiology (Cardiology) Renford Dills, MD (Internal Medicine)  Extended Emergency Contact Information Primary Emergency Contact: [facility] Address: [address]          [place], [place] [address] [place] Home Phone: [phone] Mobile Phone: [phone] Relation: Spouse Secondary Emergency Contact: Cowles,[name] Mobile Phone: [phone] Relation: Son  Code Status:  Goals of Care: Advanced Directive information    07/05/2022    4:50 PM  Advanced Directives  Does Patient Have a Medical Advance Directive? Yes  Type of Estate agent of [place];Living will;Out of facility DNR (pink MOST or yellow form)  Does patient want to make changes to medical advance directive? No - Patient declined  Copy of Healthcare Power of Attorney in Chart? Yes - validated most recent copy scanned in chart (See row information)      No chief complaint on file.   HPI: Patient is a 89 y.o. male seen today for medical management of chronic problems which include Alzheimer's dementia, adult failure to thrive, A-fib, type 2 diabetes, hypothyroidism, hyperlipidemia, and chronic kidney disease. All accounts he has done well recently and memory care unit.  There have been no behaviors as there had been previously.  I had thought we had discontinued the antipsychotics but in reviewing his medicine record that is not the case. He is fairly somnolent at our visit today.  He had no specific complaints.  Subjectively, he looks like he may have lost some weight since my last visit with him.  He is followed by hospice for failure to thrive and dementia  Past Medical History:  Diagnosis Date   Allergic rhinitis, cause unspecified    BPH (benign prostatic hyperplasia)    Carotid  artery stenosis, asymptomatic    1-39% by dopplers 09/2016 followed by Dr. Darrick Penna   Cataracts, bilateral    Chronic kidney disease (CKD), stage III (moderate) (HCC)    Complete heart block (HCC) 01/23/04   s/p Medtronic PPM implanted by Dr Amil Amen   COPD (chronic obstructive pulmonary disease) (HCC)    Diabetes mellitus    Glaucoma    Hyperlipidemia    Hypertension    Insomnia due to medical condition 08/10/2014   Kidney stones    PAF (paroxysmal atrial fibrillation) (HCC) 10/09/2014   Noted on pacer check with 88 mode switches and longest episode >1 hour.  Now on Apixiban for CHADS2VASC score of 5   Past Surgical History:  Procedure Laterality Date   PACEMAKER INSERTION  2005, 04/16/12   MDT implanted by Dr Amil Amen with generator chnage (MDT Adapta L) by Dr Johney Frame 04/16/12   PERMANENT PACEMAKER GENERATOR CHANGE N/A 04/16/2012   Procedure: PERMANENT PACEMAKER GENERATOR CHANGE;  Surgeon: Hillis Range, MD;  Location: [facility] CATH LAB;  Service: Cardiovascular;  Laterality: N/A;   RIGHT/LEFT HEART CATH AND CORONARY ANGIOGRAPHY N/A 10/18/2020   Procedure: RIGHT/LEFT HEART CATH AND CORONARY ANGIOGRAPHY;  Surgeon: Lennette Bihari, MD;  Location: [facility] INVASIVE CV LAB;  Service: Cardiovascular;  Laterality: N/A;    reports that he quit smoking about 24 years ago. His smoking use included cigarettes. He has a 25.00 pack-year smoking history. He has never used smokeless tobacco. He reports that he does not currently use alcohol. He reports that he does not  use drugs. Social History   Socioeconomic History   Marital status: Married    Spouse name: [name]   Number of children: 2   Years of education: BS   Highest education level: Not on file  Occupational History   Occupation: retired  Tobacco Use   Smoking status: Former    Packs/day: 1.00    Years: 25.00    Total pack years: 25.00    Types: Cigarettes    Quit date: 09/16/1998    Years since quitting: 24.0   Smokeless tobacco: Never  Vaping Use   Vaping  Use: Never used  Substance and Sexual Activity   Alcohol use: Not Currently    Comment: 1-2 a month glasses of wine   Drug use: No   Sexual activity: Not on file  Other Topics Concern   Not on file  Social History Narrative   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   Patient is married with 2 children.   Patient is right handed.   Patient has BS degree.   Patient drinks 1 cup daily.         Social Determinants of Health   Financial Resource Strain: Not on file  Food Insecurity: Not on file  Transportation Needs: Not on file  Physical Activity: Not on file  Stress: Not on file  Social Connections: Not on file  Intimate Partner Violence: Not on file    Functional Status Survey:    Family History  Problem Relation Age of Onset   Heart disease Father    Heart attack Father    Alzheimer's disease Sister     Health Maintenance  Topic Date Due   FOOT EXAM  Never done   OPHTHALMOLOGY EXAM  Never done   DTaP/Tdap/Td (1 - Tdap) Never done   COVID-19 Vaccine (4 - 2023-24 season) 05/17/2022   HEMOGLOBIN A1C  12/29/2022   Pneumonia Vaccine 29+ Years old  Completed   INFLUENZA VACCINE  Completed   Zoster Vaccines- Shingrix  Completed   HPV VACCINES  Aged Out    Allergies  Allergen Reactions   Penicillins Rash and Other (See Comments)    Childhood allergy    Penicillin G Benzathine Rash    Outpatient Encounter Medications as of 09/24/2022  Medication Sig   acetaminophen (TYLENOL) 325 MG tablet Take 2 tablets (650 mg total) by mouth every 6 (six) hours as needed for mild pain or headache.   apixaban (ELIQUIS) 2.5 MG TABS tablet TAKE ONE TABLET BY MOUTH TWICE DAILY (Patient taking differently: Take 2.5 mg by mouth 2 (two) times daily.)   atorvastatin (LIPITOR) 80 MG tablet TAKE ONE TABLET BY MOUTH EVERY EVENING (Patient taking differently: Take 80 mg by mouth daily.)   divalproex (DEPAKOTE SPRINKLE) 125 MG capsule Take 250 mg by mouth 2 (two) times daily.    docusate sodium (COLACE) 100 MG capsule Take 100 mg by mouth 2 (two) times daily.   haloperidol (HALDOL) 2 MG tablet Take 2 mg by mouth as needed for agitation (Or anxiety). *Give either the tablet or IM. Can not be given together.*   haloperidol lactate (HALDOL) 5 MG/ML injection Inject 2 mg into the muscle as needed (For agitation or anxiety). *Give either the tablet or IM. Can not be given together.*   isosorbide mononitrate (IMDUR) 30 MG 24 hr tablet TAKE 1/2 TABLET BY MOUTH EVERY MORNING (Patient taking differently: Take 15 mg by mouth daily.)   lactose free nutrition (BOOST) LIQD Take 237 mLs by  mouth 2 (two) times daily between meals.   levothyroxine (SYNTHROID) 75 MCG tablet Take 75 mcg by mouth daily before breakfast.   LORazepam (ATIVAN) 2 MG/ML injection Inject 2 mg into the muscle every 8 (eight) hours as needed for anxiety (Agitation). Give either the gel or IM. Cannot be given together.   LORazepam (LORAZEPAM INTENSOL) 2 MG/ML concentrated solution Apply 1mg /ml gel TOPICALLY three times a day   memantine (NAMENDA) 10 MG tablet Take 1 tablet (10 mg total) by mouth 2 (two) times daily.   nitroGLYCERIN (NITROSTAT) 0.4 MG SL tablet Place 1 tablet (0.4 mg total) under the tongue every 5 (five) minutes as needed for chest pain.   risperiDONE (RISPERDAL) 0.25 MG tablet Take 1 tablet (0.25 mg total) by mouth 2 (two) times daily.   No facility-administered encounter medications on file as of 09/24/2022.    Review of Systems  Unable to perform ROS: Dementia    There were no vitals filed for this visit. There is no height or weight on file to calculate BMI. Physical Exam Vitals and nursing note reviewed.  Constitutional:      Appearance: Normal appearance. He is normal weight.  Cardiovascular:     Rate and Rhythm: Normal rate and regular rhythm.  Pulmonary:     Effort: Pulmonary effort is normal.     Breath sounds: Normal breath sounds.  Abdominal:     General: Bowel sounds are  normal.     Palpations: Abdomen is soft.  Musculoskeletal:        General: Normal range of motion.  Neurological:     General: No focal deficit present.     Mental Status: He is alert.     Comments: Oriented to self  Psychiatric:     Comments: Patient is not particularly cooperative.  He answers simple questions unwillingly     Labs reviewed: Basic Metabolic Panel: Recent Labs    06/29/22 2006 07/01/22 0238 07/02/22 0242  NA 143 140 142  K 4.5 3.9 3.6  CL 111 108 108  CO2 21* 21* 25  GLUCOSE 209* 199* 112*  BUN 22 34* 27*  CREATININE 1.67* 1.76* 1.65*  CALCIUM 8.8* 8.5* 8.8*   Liver Function Tests: Recent Labs    06/29/22 1545  AST 25  ALT 29  ALKPHOS 77  BILITOT 0.5  PROT 6.2*  ALBUMIN 3.6   No results for input(s): "LIPASE", "AMYLASE" in the last 8760 hours. No results for input(s): "AMMONIA" in the last 8760 hours. CBC: Recent Labs    06/29/22 1545 06/29/22 1605 07/01/22 0238 07/02/22 0242  WBC 6.4  --  6.4 4.8  NEUTROABS 4.8  --   --   --   HGB 14.4 9.9* 14.3 14.9  HCT 44.5 29.0* 43.3 45.7  MCV 82.9  --  81.5 81.5  PLT 222  --  213 229   Cardiac Enzymes: No results for input(s): "CKTOTAL", "CKMB", "CKMBINDEX", "TROPONINI" in the last 8760 hours. BNP: Invalid input(s): "POCBNP" Lab Results  Component Value Date   HGBA1C 9.5 (H) 06/29/2022   No results found for: "TSH" No results found for: "VITAMINB12" No results found for: "FOLATE" Lab Results  Component Value Date   FERRITIN 103 07/02/2022    Imaging and Procedures obtained prior to SNF admission: CT Head Wo Contrast  Result Date: 06/29/2022 CLINICAL DATA:  Increased lethargy and weakness, COVID EXAM: CT HEAD WITHOUT CONTRAST TECHNIQUE: Contiguous axial images were obtained from the base of the skull through the vertex without intravenous  contrast. RADIATION DOSE REDUCTION: This exam was performed according to the departmental dose-optimization program which includes automated exposure  control, adjustment of the mA and/or kV according to patient size and/or use of iterative reconstruction technique. COMPARISON:  05/17/2021 FINDINGS: Brain: No evidence of acute infarction, hemorrhage, mass, mass effect, or midline shift. No hydrocephalus or extra-axial fluid collection. Previously noted right cerebral convexity hematoma has resolved. Periventricular white matter changes, likely the sequela of chronic small vessel ischemic disease. Vascular: No hyperdense vessel. Skull: Normal. Negative for fracture or focal lesion. Sinuses/Orbits: Mild mucosal thickening in the ethmoid air cells. Status post bilateral lens replacements. Other: The mastoid air cells are well aerated. IMPRESSION: No acute intracranial process. A subdural hematoma noted on the 05/17/2021 head CT has resolved. Electronically Signed   By: Merilyn Baba M.D.   On: 06/29/2022 17:25   DG Chest Port 1 View  Result Date: 06/29/2022 CLINICAL DATA:  COVID-19 infection. Hypoxia. Increased lethargy and weakness since yesterday. Rhonchi. EXAM: PORTABLE CHEST 1 VIEW COMPARISON:  05/17/2021 FINDINGS: Normal sized heart. Aortic arch calcifications. Clear lungs with normal vascularity. Stable right subclavian bipolar pacemaker leads. Old, healed right rib fractures. IMPRESSION: No acute abnormality. Electronically Signed   By: Claudie Revering M.D.   On: 06/29/2022 15:51    Assessment/Plan 1. Adult failure to thrive Weight has decreased about 3 pounds over the last month and down about 9 pounds over the past 5 months  2. Chronic renal impairment, stage 3a (HCC) Creatinines are stable around 1.65    4. Diabetic nephropathy associated with type 2 diabetes mellitus ([place]) As above stable creatinine.  He is on no medicine for CKD  5. Late onset Alzheimer's dementia with behavioral disturbance ([place]) Will continue as before.  I would favor discontinuation of antipsychotics in favor of memantine teen and lorazepam and Depakote    Family/  staff Communication:   Labs/tests ordered:  Lillette Boxer. Sabra Heck, [place] [address] [place], [place] Office [phone]

## 2022-10-02 ENCOUNTER — Other Ambulatory Visit: Payer: Self-pay | Admitting: Adult Health

## 2022-10-02 MED ORDER — LORAZEPAM 2 MG/ML PO CONC: ORAL | 0 refills | Status: DC

## 2022-10-18 ENCOUNTER — Non-Acute Institutional Stay (SKILLED_NURSING_FACILITY): Admitting: Nurse Practitioner

## 2022-10-18 ENCOUNTER — Encounter: Payer: Self-pay | Admitting: Nurse Practitioner

## 2022-10-18 DIAGNOSIS — G301 Alzheimer's disease with late onset: Secondary | ICD-10-CM

## 2022-10-18 DIAGNOSIS — E0849 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: Secondary | ICD-10-CM

## 2022-10-18 DIAGNOSIS — F02818 Dementia in other diseases classified elsewhere, unspecified severity, with other behavioral disturbance: Secondary | ICD-10-CM

## 2022-10-18 DIAGNOSIS — E039 Hypothyroidism, unspecified: Secondary | ICD-10-CM

## 2022-10-18 DIAGNOSIS — I10 Essential (primary) hypertension: Secondary | ICD-10-CM | POA: Diagnosis not present

## 2022-10-18 DIAGNOSIS — I442 Atrioventricular block, complete: Secondary | ICD-10-CM | POA: Diagnosis not present

## 2022-10-18 DIAGNOSIS — I480 Paroxysmal atrial fibrillation: Secondary | ICD-10-CM | POA: Diagnosis not present

## 2022-10-18 DIAGNOSIS — I5020 Unspecified systolic (congestive) heart failure: Secondary | ICD-10-CM | POA: Diagnosis not present

## 2022-10-18 DIAGNOSIS — E1121 Type 2 diabetes mellitus with diabetic nephropathy: Secondary | ICD-10-CM | POA: Diagnosis not present

## 2022-10-18 NOTE — Assessment & Plan Note (Signed)
off  Donepezil, Memantine, on Risperdal, Depakote, prn Lorazepam, followed by Neurology, Hospice for supportive care, MMSE 2/30 06/07/22

## 2022-10-18 NOTE — Assessment & Plan Note (Signed)
on Statin, Isosorbide, prn NTG, pacemaker

## 2022-10-18 NOTE — Assessment & Plan Note (Signed)
off Farxiga, EF 30-35%, compensated clinically

## 2022-10-18 NOTE — Assessment & Plan Note (Addendum)
Loose Bp control, only takes Isosorbide.

## 2022-10-18 NOTE — Assessment & Plan Note (Signed)
taking Levothyroxine, TSH 2.82 06/13/22 

## 2022-10-18 NOTE — Assessment & Plan Note (Signed)
on diet, Hgb a1c 9.5 06/29/22, repeat Hgb A1c.

## 2022-10-18 NOTE — Progress Notes (Signed)
Location:  [facility] Room Number: NO/110/A Place of Service:  SNF (31) Provider:  Musab Wingard X, NP  Patient Care Team: Tamber Burtch X, NP as PCP - General (Internal Medicine) Quintella Reichert, MD as PCP - Cardiology (Cardiology) Renford Dills, MD (Internal Medicine)  Extended Emergency Contact Information Primary Emergency Contact: Littlefield,[name] Address: [address]          [place], [place] [address] [place] of [place] Home Phone: [phone] Mobile Phone: [phone] Relation: Spouse Secondary Emergency Contact: Grzesiak,[name] Mobile Phone: [phone] Relation: Son  Code Status:  DNR Goals of care: Advanced Directive information    10/18/2022   11:20 AM  Advanced Directives  Does Patient Have a Medical Advance Directive? Yes  Type of Advance Directive Living will;Out of facility DNR (pink MOST or yellow form)  Does patient want to make changes to medical advance directive? No - Patient declined     Chief Complaint  Patient presents with   Medical Management of Chronic Issues    Patient is here for a follow up for chronic conditions    Quality Metric Gaps    Patient is due for foot and eye exam, discuss need for updated vaccines    HPI:  Pt is a 89 y.o. male seen today for medical management of chronic diseases.      Dysphagia III diet             Alzheimer's dementia,  off  Donepezil, Memantine, on Risperdal, Depakote, prn Lorazepam, followed by Neurology, Hospice for supportive care, MMSE 2/30 06/07/22             Adult Failure To Thrive, weight loss, followed by dietary             Afib followed by Cardiology, heart rate is in control. on Eliquis             CAD, on Statin, Isosorbide, prn NTG             CHFrEF, off Farxiga, EF 30-35%             CHB s/p PPM             T2DM, on diet, Hgb a1c 9.5 06/29/22             Hypothyroidism, taking Levothyroxine, TSH 2.82 06/13/22             Hyperlipidemia, takes Atorvastatin, LDL 60  06/13/22             HTN, off Valsartan             CKD chronic, Bun/creat 27/1.65 07/02/22  Past Medical History:  Diagnosis Date   Allergic rhinitis, cause unspecified    BPH (benign prostatic hyperplasia)    Carotid artery stenosis, asymptomatic    1-39% by dopplers 09/2016 followed by Dr. Darrick Penna   Cataracts, bilateral    Chronic kidney disease (CKD), stage III (moderate) (HCC)    Complete heart block (HCC) 01/23/04   s/p Medtronic PPM implanted by Dr Amil Amen   COPD (chronic obstructive pulmonary disease) (HCC)    Diabetes mellitus    Glaucoma    Hyperlipidemia    Hypertension    Insomnia due to medical condition 08/10/2014   Kidney stones    PAF (paroxysmal atrial fibrillation) (HCC) 10/09/2014   Noted on pacer check with 88 mode switches and longest episode >1 hour.  Now on Apixiban for CHADS2VASC score of 5   Past Surgical History:  Procedure Laterality Date   PACEMAKER INSERTION  2005, 04/16/12   MDT implanted by Dr Leonia Reeves with generator chnage (MDT Adapta L) by Dr Rayann Heman 04/16/12   PERMANENT PACEMAKER GENERATOR CHANGE N/A 04/16/2012   Procedure: PERMANENT PACEMAKER GENERATOR CHANGE;  Surgeon: Thompson Grayer, MD;  [facility] LAB;  Service: Cardiovascular;  Laterality: N/A;   RIGHT/LEFT HEART CATH AND CORONARY ANGIOGRAPHY N/A 10/18/2020   Procedure: RIGHT/LEFT HEART CATH AND CORONARY ANGIOGRAPHY;  Surgeon: Troy Sine, MD;  Location: [facility] CV LAB;  Service: Cardiovascular;  Laterality: N/A;    Allergies  Allergen Reactions   Penicillins Rash and Other (See Comments)    Childhood allergy    Penicillin G Benzathine Rash    Outpatient Encounter Medications as of 10/18/2022  Medication Sig   acetaminophen (TYLENOL) 325 MG tablet Take 2 tablets (650 mg total) by mouth every 6 (six) hours as needed for mild pain or headache.   apixaban (ELIQUIS) 2.5 MG TABS tablet TAKE ONE TABLET BY MOUTH TWICE DAILY (Patient taking differently: Take 2.5 mg by mouth 2 (two) times daily.)    atorvastatin (LIPITOR) 80 MG tablet TAKE ONE TABLET BY MOUTH EVERY EVENING (Patient taking differently: Take 80 mg by mouth daily.)   divalproex (DEPAKOTE SPRINKLE) 125 MG capsule Take 250 mg by mouth 2 (two) times daily.   docusate sodium (COLACE) 100 MG capsule Take 100 mg by mouth 2 (two) times daily.   haloperidol (HALDOL) 2 MG tablet Take 2 mg by mouth as needed for agitation (Or anxiety). *Give either the tablet or IM. Can not be given together.*   haloperidol lactate (HALDOL) 5 MG/ML injection Inject 2 mg into the muscle as needed (For agitation or anxiety). *Give either the tablet or IM. Can not be given together.*   isosorbide mononitrate (IMDUR) 30 MG 24 hr tablet TAKE 1/2 TABLET BY MOUTH EVERY MORNING (Patient taking differently: Take 15 mg by mouth daily.)   lactose free nutrition (BOOST) LIQD Take 237 mLs by mouth 2 (two) times daily between meals.   levothyroxine (SYNTHROID) 75 MCG tablet Take 75 mcg by mouth daily before breakfast.   LORazepam (ATIVAN) 2 MG/ML injection Inject 2 mg into the muscle every 8 (eight) hours as needed for anxiety (Agitation). Give either the gel or IM. Cannot be given together.   LORazepam (LORAZEPAM INTENSOL) 2 MG/ML concentrated solution Apply 1mg /ml gel TOPICALLY three times a day   memantine (NAMENDA) 10 MG tablet Take 1 tablet (10 mg total) by mouth 2 (two) times daily.   nitroGLYCERIN (NITROSTAT) 0.4 MG SL tablet Place 1 tablet (0.4 mg total) under the tongue every 5 (five) minutes as needed for chest pain.   risperiDONE (RISPERDAL) 0.25 MG tablet Take 1 tablet (0.25 mg total) by mouth 2 (two) times daily.   No facility-administered encounter medications on file as of 10/18/2022.    Review of Systems  Unable to perform ROS: Dementia    Immunization History  Administered Date(s) Administered   Influenza-Unspecified 07/04/2014, 08/05/2022   Moderna SARS-COV2 Booster Vaccination 07/25/2020, 02/13/2021   Moderna Sars-Covid-2 Vaccination  09/20/2019, 10/18/2019   PNEUMOCOCCAL CONJUGATE-20 08/07/2022   Pfizer Covid-19 Vaccine Bivalent Booster 4yrs & up 06/20/2021   Respiratory Syncytial Virus Vaccine,Recomb Aduvanted(Arexvy) 10/04/2022   Zoster Recombinat (Shingrix) 11/06/2017, 03/28/2018   Pertinent  Health Maintenance Due  Topic Date Due   OPHTHALMOLOGY EXAM  Never done   HEMOGLOBIN A1C  12/29/2022   FOOT EXAM  10/19/2023   INFLUENZA VACCINE  Completed  07/02/2022    1:00 PM 07/02/2022    7:52 PM 07/03/2022    8:00 AM 07/05/2022    4:50 PM 10/18/2022   11:21 AM  Fall Risk  Falls in the past year?    0 0  Was there an injury with Fall?    0 0  Fall Risk Category Calculator    0 0  Fall Risk Category (Retired)    Low   (RETIRED) Patient Fall Risk Level High fall risk High fall risk High fall risk High fall risk   Patient at Risk for Falls Due to    History of fall(s) No Fall Risks  Fall risk Follow up    Falls evaluation completed Falls evaluation completed   Functional Status Survey:    Vitals:   10/18/22 1117  BP: (!) 140/91  Pulse: 70  Resp: 17  Temp: 97.7 F (36.5 C)  SpO2: 96%  Weight: 144 lb 3.2 oz (65.4 kg)  Height: 5\' 6"  (1.676 m)   Body mass index is 23.27 kg/m. Physical Exam Vitals and nursing note reviewed.  Constitutional:      Appearance: Normal appearance.  HENT:     Head: Normocephalic and atraumatic.     Nose: Nose normal.     Mouth/Throat:     Mouth: Mucous membranes are moist.  Eyes:     Extraocular Movements: Extraocular movements intact.     Conjunctiva/sclera: Conjunctivae normal.     Pupils: Pupils are equal, round, and reactive to light.  Cardiovascular:     Rate and Rhythm: Normal rate and regular rhythm.     Heart sounds: No murmur heard. Pulmonary:     Effort: Pulmonary effort is normal.     Breath sounds: No rales.  Abdominal:     General: Bowel sounds are normal.     Palpations: Abdomen is soft.     Tenderness: There is no abdominal tenderness.   Genitourinary:    Comments: External hemorrhoids Musculoskeletal:     Cervical back: Normal range of motion and neck supple.     Right lower leg: No edema.     Left lower leg: No edema.  Skin:    General: Skin is warm and dry.  Neurological:     General: No focal deficit present.     Mental Status: He is alert. Mental status is at baseline.     Gait: Gait abnormal.     Comments: Oriented to person  Psychiatric:     Comments: Flat affect, non cooperative.      Labs reviewed: Recent Labs    06/29/22 2006 07/01/22 0238 07/02/22 0242  NA 143 140 142  K 4.5 3.9 3.6  CL 111 108 108  CO2 21* 21* 25  GLUCOSE 209* 199* 112*  BUN 22 34* 27*  CREATININE 1.67* 1.76* 1.65*  CALCIUM 8.8* 8.5* 8.8*   Recent Labs    06/29/22 1545  AST 25  ALT 29  ALKPHOS 77  BILITOT 0.5  PROT 6.2*  ALBUMIN 3.6   Recent Labs    06/29/22 1545 06/29/22 1605 07/01/22 0238 07/02/22 0242  WBC 6.4  --  6.4 4.8  NEUTROABS 4.8  --   --   --   HGB 14.4 9.9* 14.3 14.9  HCT 44.5 29.0* 43.3 45.7  MCV 82.9  --  81.5 81.5  PLT 222  --  213 229   No results found for: "TSH" Lab Results  Component Value Date   HGBA1C 9.5 (H) 06/29/2022  Lab Results  Component Value Date   CHOL 128 12/16/2016   HDL 55 12/16/2016   LDLCALC 58 12/16/2016   TRIG 97 06/29/2022   CHOLHDL 2.3 12/16/2016    Significant Diagnostic Results in last 30 days:  No results found.  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   Late onset Alzheimer's dementia with behavioral disturbance (HCC) off  Donepezil, Memantine, on Risperdal, Depakote, prn Lorazepam, followed by Neurology, Hospice for supportive care, MMSE 2/30 06/07/22  PAF (paroxysmal atrial fibrillation) ([place])  followed by Cardiology, heart rate is in control. 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   Labs/tests ordered:  none  Time spend 35 minutes.

## 2022-10-18 NOTE — Assessment & Plan Note (Signed)
Bun/creat 27/1.65 07/02/22

## 2022-10-18 NOTE — Assessment & Plan Note (Signed)
followed by Cardiology, heart rate is in control. on Eliquis

## 2022-10-29 DIAGNOSIS — N390 Urinary tract infection, site not specified: Secondary | ICD-10-CM | POA: Diagnosis not present

## 2022-11-04 ENCOUNTER — Encounter: Payer: Self-pay | Admitting: Nurse Practitioner

## 2022-11-04 ENCOUNTER — Non-Acute Institutional Stay (SKILLED_NURSING_FACILITY): Admitting: Nurse Practitioner

## 2022-11-04 DIAGNOSIS — I10 Essential (primary) hypertension: Secondary | ICD-10-CM | POA: Diagnosis not present

## 2022-11-04 DIAGNOSIS — I5020 Unspecified systolic (congestive) heart failure: Secondary | ICD-10-CM

## 2022-11-04 DIAGNOSIS — N1831 Chronic kidney disease, stage 3a: Secondary | ICD-10-CM

## 2022-11-04 DIAGNOSIS — F03918 Unspecified dementia, unspecified severity, with other behavioral disturbance: Secondary | ICD-10-CM | POA: Diagnosis not present

## 2022-11-04 DIAGNOSIS — E039 Hypothyroidism, unspecified: Secondary | ICD-10-CM

## 2022-11-04 DIAGNOSIS — I480 Paroxysmal atrial fibrillation: Secondary | ICD-10-CM | POA: Diagnosis not present

## 2022-11-04 DIAGNOSIS — E0849 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: Secondary | ICD-10-CM | POA: Diagnosis not present

## 2022-11-04 DIAGNOSIS — E7800 Pure hypercholesterolemia, unspecified: Secondary | ICD-10-CM

## 2022-11-04 NOTE — Assessment & Plan Note (Signed)
Controlled, not taking meds.

## 2022-11-04 NOTE — Assessment & Plan Note (Signed)
off  Donepezil, Memantine, on Depakote, prn Lorazepam, followed by Neurology, Hospice for supportive care, MMSE 2/30 06/07/22

## 2022-11-04 NOTE — Assessment & Plan Note (Signed)
on diet, Hgb a1c 9.5 06/29/22, on diet

## 2022-11-04 NOTE — Assessment & Plan Note (Signed)
heart rate is in control, off  Eliquis

## 2022-11-04 NOTE — Assessment & Plan Note (Signed)
Compensated clinically.  

## 2022-11-04 NOTE — Assessment & Plan Note (Signed)
taking Levothyroxine, TSH 2.82 06/13/22

## 2022-11-04 NOTE — Progress Notes (Signed)
Location:  [facility] Room Number: NO/110/A Place of Service:  SNF (31) Provider:  Patsy Zaragoza X, NP  Patient Care Team: Raena Pau X, NP as PCP - General (Internal Medicine) Sueanne Margarita, MD as PCP - Cardiology (Cardiology) Seward Carol, MD (Internal Medicine)  Extended Emergency Contact Information Primary Emergency Contact: Foell,[name] Address: [address]          [place],  [address] [name] of [place] Phone: [phone] Mobile Phone: [phone] Relation: Spouse Secondary Emergency Contact: Chipps,[name] Mobile Phone: [phone] Relation: Son  Code Status:  DNR Goals of care: Advanced Directive information    11/04/2022    2:31 PM  Advanced Directives  Does Patient Have a Medical Advance Directive? Yes  Type of Advance Directive Living will;Out of facility DNR (pink MOST or yellow form)  Does patient want to make changes to medical advance directive? No - Patient declined     Chief Complaint  Patient presents with   Medical Management of Chronic Issues    Patient is here for a follow up for chronic conditions    Quality Metric Gaps    Patient is due for an annual eye exam    HPI:  Pt is a 89 y.o. male seen today for medical management of chronic diseases.     Dysphagia III diet             Alzheimer's dementia,  off  Donepezil, Memantine, on Depakote, prn Lorazepam, followed by Neurology, Hospice for supportive care, MMSE 2/30 06/07/22             Adult Failure To Thrive, weight loss, followed by dietary             Afib followed by Cardiology, heart rate is in control, off  Eliquis             CAD, on Statin, prn NTG             CHFrEF, off Farxiga, EF 30-35%             CHB s/p PPM             T2DM, on diet, Hgb a1c 9.5 06/29/22, on diet             Hypothyroidism, taking Levothyroxine, TSH 2.82 06/13/22             Hyperlipidemia, takes Atorvastatin, LDL 60 06/13/22             HTN, off Valsartan              CKD chronic, Bun/creat 27/1.65 07/02/22   Past Medical History:  Diagnosis Date   Allergic rhinitis, cause unspecified    BPH (benign prostatic hyperplasia)    Carotid artery stenosis, asymptomatic    1-39% by dopplers 09/2016 followed by Dr. Oneida Alar   Cataracts, bilateral    Chronic kidney disease (CKD), stage III (moderate) ([place])    Complete heart block ([place]) 01/23/04   s/p Medtronic PPM implanted by Dr Leonia Reeves   COPD (chronic obstructive pulmonary disease) ([place])    Diabetes mellitus    Glaucoma    Hyperlipidemia    Hypertension    Insomnia due to medical condition 08/10/2014   Kidney stones    PAF (paroxysmal atrial fibrillation) ([place]) 10/09/2014   Noted on pacer check with 88 mode switches and longest episode >1 hour.  Now on Apixiban for [place] score of 5   Past Surgical History:  Procedure Laterality Date  PACEMAKER INSERTION  2005, 04/16/12   MDT implanted by Dr Leonia Reeves with generator chnage (MDT Adapta L) by Dr Rayann Heman 04/16/12   PERMANENT PACEMAKER GENERATOR CHANGE N/A 04/16/2012   Procedure: PERMANENT PACEMAKER GENERATOR CHANGE;  Surgeon: Thompson Grayer, MD;  Location: [facility] CATH LAB;  Service: Cardiovascular;  Laterality: N/A;   RIGHT/LEFT HEART CATH AND CORONARY ANGIOGRAPHY N/A 10/18/2020   Procedure: RIGHT/LEFT HEART CATH AND CORONARY ANGIOGRAPHY;  Surgeon: Troy Sine, MD;  Location: [facility] CV LAB;  Service: Cardiovascular;  Laterality: N/A;    Allergies  Allergen Reactions   Penicillins Rash and Other (See Comments)    Childhood allergy    Penicillin G Benzathine Rash    Outpatient Encounter Medications as of 11/04/2022  Medication Sig   acetaminophen (TYLENOL) 325 MG tablet Take 2 tablets (650 mg total) by mouth every 6 (six) hours as needed for mild pain or headache.   apixaban (ELIQUIS) 2.5 MG TABS tablet TAKE ONE TABLET BY MOUTH TWICE DAILY (Patient taking differently: Take 2.5 mg by mouth 2 (two) times daily.)   atorvastatin (LIPITOR) 80 MG tablet TAKE ONE  TABLET BY MOUTH EVERY EVENING (Patient taking differently: Take 80 mg by mouth daily.)   divalproex (DEPAKOTE SPRINKLE) 125 MG capsule Take 250 mg by mouth 2 (two) times daily.   docusate sodium (COLACE) 100 MG capsule Take 100 mg by mouth 2 (two) times daily.   haloperidol (HALDOL) 2 MG tablet Take 2 mg by mouth as needed for agitation (Or anxiety). *Give either the tablet or IM. Can not be given together.*   haloperidol lactate (HALDOL) 5 MG/ML injection Inject 2 mg into the muscle as needed (For agitation or anxiety). *Give either the tablet or IM. Can not be given together.*   isosorbide mononitrate (IMDUR) 30 MG 24 hr tablet TAKE 1/2 TABLET BY MOUTH EVERY MORNING (Patient taking differently: Take 15 mg by mouth daily.)   lactose free nutrition (BOOST) LIQD Take 237 mLs by mouth 2 (two) times daily between meals.   levothyroxine (SYNTHROID) 75 MCG tablet Take 75 mcg by mouth daily before breakfast.   LORazepam (ATIVAN) 2 MG/ML injection Inject 2 mg into the muscle every 8 (eight) hours as needed for anxiety (Agitation). Give either the gel or IM. Cannot be given together.   LORazepam (LORAZEPAM INTENSOL) 2 MG/ML concentrated solution Apply 109m/ml gel TOPICALLY three times a day   memantine (NAMENDA) 10 MG tablet Take 1 tablet (10 mg total) by mouth 2 (two) times daily.   nitroGLYCERIN (NITROSTAT) 0.4 MG SL tablet Place 1 tablet (0.4 mg total) under the tongue every 5 (five) minutes as needed for chest pain.   risperiDONE (RISPERDAL) 0.25 MG tablet Take 1 tablet (0.25 mg total) by mouth 2 (two) times daily.   No facility-administered encounter medications on file as of 11/04/2022.    Review of Systems  Unable to perform ROS: Dementia    Immunization History  Administered Date(s) Administered   Influenza-Unspecified 07/04/2014, 08/05/2022   Moderna Covid-19 Vaccine Bivalent Booster 138yr& up 07/18/2022   Moderna SARS-COV2 Booster Vaccination 07/25/2020, 02/13/2021   Moderna Sars-Covid-2  Vaccination 09/20/2019, 10/18/2019   PNEUMOCOCCAL CONJUGATE-20 08/07/2022   Pfizer Covid-19 Vaccine Bivalent Booster 1216yr up 06/20/2021   Respiratory Syncytial Virus Vaccine,Recomb Aduvanted(Arexvy) 10/04/2022   Tdap 10/28/2022   Zoster Recombinat (Shingrix) 11/06/2017, 03/28/2018   Pertinent  Health Maintenance Due  Topic Date Due   OPHTHALMOLOGY EXAM  Never done   HEMOGLOBIN A1C  12/29/2022   FOOT EXAM  10/19/2023   INFLUENZA VACCINE  Completed      07/02/2022    7:52 PM 07/03/2022    8:00 AM 07/05/2022    4:50 PM 10/18/2022   11:21 AM 11/04/2022    2:33 PM  Fall Risk  Falls in the past year?   0 0 0  Was there an injury with Fall?   0 0 0  Fall Risk Category Calculator   0 0 0  Fall Risk Category (Retired)   Low    (RETIRED) Patient Fall Risk Level High fall risk High fall risk High fall risk    Patient at Risk for Falls Due to   History of fall(s) No Fall Risks   Fall risk Follow up   Falls evaluation completed Falls evaluation completed Falls evaluation completed   Functional Status Survey:    Vitals:   11/04/22 1428  BP: 128/72  Pulse: 66  Resp: 17  Temp: 97.7 F (36.5 C)  SpO2: 96%  Weight: 144 lb 3.2 oz (65.4 kg)  Height: 5' 6"$  (1.676 m)   Body mass index is 23.27 kg/m. Physical Exam Vitals and nursing note reviewed.  Constitutional:      Appearance: Normal appearance.  HENT:     Head: Normocephalic and atraumatic.     Nose: Nose normal.     Mouth/Throat:     Mouth: Mucous membranes are moist.  Eyes:     Extraocular Movements: Extraocular movements intact.     Conjunctiva/sclera: Conjunctivae normal.     Pupils: Pupils are equal, round, and reactive to light.  Cardiovascular:     Rate and Rhythm: Normal rate and regular rhythm.     Heart sounds: No murmur heard. Pulmonary:     Effort: Pulmonary effort is normal.     Breath sounds: No rales.  Abdominal:     General: Bowel sounds are normal.     Palpations: Abdomen is soft.     Tenderness:  There is no abdominal tenderness.  Genitourinary:    Comments: External hemorrhoids from previous examination.  Musculoskeletal:     Cervical back: Normal range of motion and neck supple.     Right lower leg: No edema.     Left lower leg: No edema.  Skin:    General: Skin is warm and dry.  Neurological:     General: No focal deficit present.     Mental Status: He is alert. Mental status is at baseline.     Gait: Gait abnormal.     Comments: Oriented to person  Psychiatric:     Comments: Flat affect, non cooperative.      Labs reviewed: Recent Labs    06/29/22 2006 07/01/22 0238 07/02/22 0242  NA 143 140 142  K 4.5 3.9 3.6  CL 111 108 108  CO2 21* 21* 25  GLUCOSE 209* 199* 112*  BUN 22 34* 27*  CREATININE 1.67* 1.76* 1.65*  CALCIUM 8.8* 8.5* 8.8*   Recent Labs    06/29/22 1545  AST 25  ALT 29  ALKPHOS 77  BILITOT 0.5  PROT 6.2*  ALBUMIN 3.6   Recent Labs    06/29/22 1545 06/29/22 1605 07/01/22 0238 07/02/22 0242  WBC 6.4  --  6.4 4.8  NEUTROABS 4.8  --   --   --   HGB 14.4 9.9* 14.3 14.9  HCT 44.5 29.0* 43.3 45.7  MCV 82.9  --  81.5 81.5  PLT 222  --  213 229   No results found for: "  TSH" Lab Results  Component Value Date   HGBA1C 9.5 (H) 06/29/2022   Lab Results  Component Value Date   CHOL 128 12/16/2016   HDL 55 12/16/2016   LDLCALC 58 12/16/2016   TRIG 97 06/29/2022   CHOLHDL 2.3 12/16/2016    Significant Diagnostic Results in last 30 days:  No results found.  Assessment/Plan Chronic renal insufficiency, stage III (moderate) (HCC) Bun/creat 27/1.65 07/02/22  Hypertension Controlled, not taking meds.   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   PAF (paroxysmal atrial fibrillation) (HCC) heart rate is in control, off  Eliquis  Senile dementia with behavioral disturbance (HCC) off  Donepezil, Memantine, on Depakote, prn Lorazepam, followed by Neurology, Hospice for supportive care, MMSE 2/30 06/07/22     Family/ staff Communication: plan of care reviewed with the patient and charge nurse.   Labs/tests ordered:  none  Time spend 35 minutes.

## 2022-11-04 NOTE — Assessment & Plan Note (Signed)
Bun/creat 27/1.65 07/02/22

## 2022-11-04 NOTE — Progress Notes (Deleted)
.  psc

## 2022-11-04 NOTE — Assessment & Plan Note (Signed)
takes Atorvastatin, LDL 60 06/13/22

## 2022-11-15 ENCOUNTER — Encounter: Payer: Self-pay | Admitting: Nurse Practitioner

## 2022-11-15 ENCOUNTER — Non-Acute Institutional Stay (SKILLED_NURSING_FACILITY): Payer: PPO | Admitting: Nurse Practitioner

## 2022-11-15 DIAGNOSIS — I5020 Unspecified systolic (congestive) heart failure: Secondary | ICD-10-CM

## 2022-11-15 DIAGNOSIS — E0849 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: Secondary | ICD-10-CM

## 2022-11-15 DIAGNOSIS — E039 Hypothyroidism, unspecified: Secondary | ICD-10-CM

## 2022-11-15 DIAGNOSIS — F03918 Unspecified dementia, unspecified severity, with other behavioral disturbance: Secondary | ICD-10-CM

## 2022-11-15 DIAGNOSIS — I480 Paroxysmal atrial fibrillation: Secondary | ICD-10-CM | POA: Diagnosis not present

## 2022-11-15 DIAGNOSIS — F0394 Unspecified dementia, unspecified severity, with anxiety: Secondary | ICD-10-CM

## 2022-11-15 NOTE — Assessment & Plan Note (Signed)
on diet, Hgb a1c 9.5 06/29/22

## 2022-11-15 NOTE — Assessment & Plan Note (Signed)
Clinically compensated,  off Farxiga, EF 30-35%

## 2022-11-15 NOTE — Progress Notes (Signed)
Location:  [place] Room Number: NO/110/A Place of Service:  SNF (31) Provider:  Yifan Auker X, NP  Patient Care Team: Laddie Math X, NP as PCP - General (Internal Medicine) Sueanne Margarita, MD as PCP - Cardiology (Cardiology) Seward Carol, MD (Internal Medicine)  Extended Emergency Contact Information Primary Emergency Contact: Chernick,[name] Address: [address]          [place], [place] [address] [name] of [place] Phone: [phone] Mobile Phone: [phone] Relation: Spouse Secondary Emergency Contact: Hodak,[name] Mobile Phone: [phone] Relation: Son  Code Status:  DNR Goals of care: Advanced Directive information    11/04/2022    2:31 PM  Advanced Directives  Does Patient Have a Medical Advance Directive? Yes  Type of Advance Directive Living will;Out of facility DNR (pink MOST or yellow form)  Does patient want to make changes to medical advance directive? No - Patient declined     Chief Complaint  Patient presents with   Acute Visit    Patient is being seen for combative behavior    HPI:  Pt is a 89 y.o. male seen today for an acute visit for combative and irritable behaviors, especially when assistance with ADL provided.     Dysphagia III diet Senile dementia,  off  Donepezil, Memantine, on Depakote, prn Lorazepam, followed by Neurology, Hospice for supportive care, MMSE 2/30 06/07/22             Adult Failure To Thrive, weight loss, followed by dietary             Afib followed by Cardiology, heart rate is in control, off  Eliquis             CAD, on Statin, prn NTG             CHFrEF, off Farxiga, EF 30-35%             CHB s/p PPM             T2DM, on diet, Hgb a1c 9.5 06/29/22             Hypothyroidism, taking Levothyroxine, TSH 2.82 06/13/22             Hyperlipidemia, takes Atorvastatin, LDL 60 06/13/22             HTN, off Valsartan             CKD chronic, Bun/creat 27/1.65 07/02/22      Past Medical  History:  Diagnosis Date   Allergic rhinitis, cause unspecified    BPH (benign prostatic hyperplasia)    Carotid artery stenosis, asymptomatic    1-39% by dopplers 09/2016 followed by Dr. Oneida Alar   Cataracts, bilateral    Chronic kidney disease (CKD), stage III (moderate) ([place])    Complete heart block ([place]) 01/23/04   s/p Medtronic PPM implanted by Dr Leonia Reeves   COPD (chronic obstructive pulmonary disease) ([place])    Diabetes mellitus    Glaucoma    Hyperlipidemia    Hypertension    Insomnia due to medical condition 08/10/2014   Kidney stones    PAF (paroxysmal atrial fibrillation) ([place]) 10/09/2014   Noted on pacer check with 88 mode switches and longest episode >1 hour.  Now on Apixiban for Delmar score of 5   Past Surgical History:  Procedure Laterality Date   PACEMAKER INSERTION  2005, 04/16/12   MDT implanted by Dr Leonia Reeves with generator chnage (MDT Adapta L) by Dr Rayann Heman 04/16/12  PERMANENT PACEMAKER GENERATOR CHANGE N/A 04/16/2012   Procedure: PERMANENT PACEMAKER GENERATOR CHANGE;  Surgeon: Thompson Grayer, MD;  Location: [facility] C[facility] LAB;  Service: Cardiovascular;  Laterality: N/A;   RIGHT/LEFT HEART CATH AND CORONARY ANGIOGRAPHY N/A 10/18/2020   Procedure: RIGHT/LEFT HEART CATH AND CORONARY ANGIOGRAPHY;  Surgeon: Troy Sine, MD;  Location: [facility] CV LAB;  Service: Cardiovascular;  Laterality: N/A;    Allergies  Allergen Reactions   Penicillins Rash and Other (See Comments)    Childhood allergy    Penicillin G Benzathine Rash    Outpatient Encounter Medications as of 11/15/2022  Medication Sig   acetaminophen (TYLENOL) 325 MG tablet Take 2 tablets (650 mg total) by mouth every 6 (six) hours as needed for mild pain or headache.   apixaban (ELIQUIS) 2.5 MG TABS tablet TAKE ONE TABLET BY MOUTH TWICE DAILY (Patient taking differently: Take 2.5 mg by mouth 2 (two) times daily.)   atorvastatin (LIPITOR) 80 MG tablet TAKE ONE TABLET BY MOUTH EVERY EVENING (Patient taking differently:  Take 80 mg by mouth daily.)   divalproex (DEPAKOTE SPRINKLE) 125 MG capsule Take 250 mg by mouth 2 (two) times daily.   docusate sodium (COLACE) 100 MG capsule Take 100 mg by mouth 2 (two) times daily.   haloperidol (HALDOL) 2 MG tablet Take 2 mg by mouth as needed for agitation (Or anxiety). *Give either the tablet or IM. Can not be given together.*   haloperidol lactate (HALDOL) 5 MG/ML injection Inject 2 mg into the muscle as needed (For agitation or anxiety). *Give either the tablet or IM. Can not be given together.*   isosorbide mononitrate (IMDUR) 30 MG 24 hr tablet TAKE 1/2 TABLET BY MOUTH EVERY MORNING (Patient taking differently: Take 15 mg by mouth daily.)   lactose free nutrition (BOOST) LIQD Take 237 mLs by mouth 2 (two) times daily between meals.   levothyroxine (SYNTHROID) 75 MCG tablet Take 75 mcg by mouth daily before breakfast.   LORazepam (ATIVAN) 2 MG/ML injection Inject 2 mg into the muscle every 8 (eight) hours as needed for anxiety (Agitation). Give either the gel or IM. Cannot be given together.   LORazepam (LORAZEPAM INTENSOL) 2 MG/ML concentrated solution Apply '1mg'$ /ml gel TOPICALLY three times a day   memantine (NAMENDA) 10 MG tablet Take 1 tablet (10 mg total) by mouth 2 (two) times daily.   nitroGLYCERIN (NITROSTAT) 0.4 MG SL tablet Place 1 tablet (0.4 mg total) under the tongue every 5 (five) minutes as needed for chest pain.   risperiDONE (RISPERDAL) 0.25 MG tablet Take 1 tablet (0.25 mg total) by mouth 2 (two) times daily.   No facility-administered encounter medications on file as of 11/15/2022.    Review of Systems  Unable to perform ROS: Dementia    Immunization History  Administered Date(s) Administered   Influenza-Unspecified 07/04/2014, 08/05/2022   Moderna Covid-19 Vaccine Bivalent Booster 64yr & up 07/18/2022   Moderna SARS-COV2 Booster Vaccination 07/25/2020, 02/13/2021   Moderna Sars-Covid-2 Vaccination 09/20/2019, 10/18/2019   PNEUMOCOCCAL  CONJUGATE-20 08/07/2022   Pfizer Covid-19 Vaccine Bivalent Booster 141yr& up 06/20/2021   Respiratory Syncytial Virus Vaccine,Recomb Aduvanted(Arexvy) 10/04/2022   Tdap 10/28/2022   Zoster Recombinat (Shingrix) 11/06/2017, 03/28/2018   Pertinent  Health Maintenance Due  Topic Date Due   OPHTHALMOLOGY EXAM  Never done   HEMOGLOBIN A1C  12/29/2022   FOOT EXAM  10/19/2023   INFLUENZA VACCINE  Completed      07/03/2022    8:00 AM 07/05/2022    4:50 PM  10/18/2022   11:21 AM 11/04/2022    2:33 PM 11/15/2022    8:54 AM  Fall Risk  Falls in the past year?  0 0 0 0  Was there an injury with Fall?  0 0 0 0  Fall Risk Category Calculator  0 0 0 0  Fall Risk Category (Retired)  Low     (RETIRED) Patient Fall Risk Level High fall risk High fall risk     Patient at Risk for Falls Due to  History of fall(s) No Fall Risks  No Fall Risks  Fall risk Follow up  Falls evaluation completed Falls evaluation completed Falls evaluation completed Falls evaluation completed   Functional Status Survey:    Vitals:   11/15/22 0854  BP: 118/70  Pulse: 90  Resp: 17  Temp: 97.7 F (36.5 C)  SpO2: 96%  Weight: 144 lb 3.2 oz (65.4 kg)  Height: '5\' 6"'$  (1.676 m)   Body mass index is 23.27 kg/m. Physical Exam Vitals and nursing note reviewed.  Constitutional:      Appearance: Normal appearance.  HENT:     Head: Normocephalic and atraumatic.     Nose: Nose normal.     Mouth/Throat:     Mouth: Mucous membranes are moist.  Eyes:     Extraocular Movements: Extraocular movements intact.     Conjunctiva/sclera: Conjunctivae normal.     Pupils: Pupils are equal, round, and reactive to light.  Cardiovascular:     Rate and Rhythm: Normal rate and regular rhythm.     Heart sounds: No murmur heard. Pulmonary:     Effort: Pulmonary effort is normal.     Breath sounds: No rales.  Abdominal:     General: Bowel sounds are normal.     Palpations: Abdomen is soft.     Tenderness: There is no abdominal  tenderness.  Genitourinary:    Comments: External hemorrhoids from previous examination.  Musculoskeletal:     Cervical back: Normal range of motion and neck supple.     Right lower leg: No edema.     Left lower leg: No edema.  Skin:    General: Skin is warm and dry.  Neurological:     General: No focal deficit present.     Mental Status: He is alert. Mental status is at baseline.     Gait: Gait abnormal.     Comments: Oriented to person  Psychiatric:     Comments: Flat affect, non cooperative.      Labs reviewed: Recent Labs    06/29/22 2006 07/01/22 0238 07/02/22 0242  NA 143 140 142  K 4.5 3.9 3.6  CL 111 108 108  CO2 21* 21* 25  GLUCOSE 209* 199* 112*  BUN 22 34* 27*  CREATININE 1.67* 1.76* 1.65*  CALCIUM 8.8* 8.5* 8.8*   Recent Labs    06/29/22 1545  AST 25  ALT 29  ALKPHOS 77  BILITOT 0.5  PROT 6.2*  ALBUMIN 3.6   Recent Labs    06/29/22 1545 06/29/22 1605 07/01/22 0238 07/02/22 0242  WBC 6.4  --  6.4 4.8  NEUTROABS 4.8  --   --   --   HGB 14.4 9.9* 14.3 14.9  HCT 44.5 29.0* 43.3 45.7  MCV 82.9  --  81.5 81.5  PLT 222  --  213 229   No results found for: "TSH" Lab Results  Component Value Date   HGBA1C 9.5 (H) 06/29/2022   Lab Results  Component Value Date  CHOL 128 12/16/2016   HDL 55 12/16/2016   LDLCALC 58 12/16/2016   TRIG 97 06/29/2022   CHOLHDL 2.3 12/16/2016    Significant Diagnostic Results in last 30 days:  No results found.  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  Will try Lexapro '5mg'$  qd x7 days, then '10mg'$  qd. Prn Lorazepam '1mg'$  gel q12h x 14 days, continue Depakote '250mg'$  tid. Observe.   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   Labs/tests ordered:  none  Time spend 35 minutes.

## 2022-11-15 NOTE — Assessment & Plan Note (Signed)
combative and irritable behaviors, especially when assistance with ADL provided.  Will try Lexapro '5mg'$  qd x7 days, then '10mg'$  qd. Prn Lorazepam '1mg'$  gel q12h x 14 days, continue Depakote '250mg'$  tid. Observe.

## 2022-11-15 NOTE — Assessment & Plan Note (Signed)
taking Levothyroxine, TSH 2.82 06/13/22

## 2022-11-15 NOTE — Assessment & Plan Note (Signed)
heart rate is in control, off  Eliquis

## 2022-11-15 NOTE — Assessment & Plan Note (Signed)
off  Donepezil, Memantine, on Depakote, prn Lorazepam, followed by Neurology, Hospice for supportive care, MMSE 2/30 06/07/22

## 2022-11-28 ENCOUNTER — Non-Acute Institutional Stay (SKILLED_NURSING_FACILITY): Payer: PPO | Admitting: Nurse Practitioner

## 2022-11-28 ENCOUNTER — Encounter: Payer: Self-pay | Admitting: Nurse Practitioner

## 2022-11-28 DIAGNOSIS — F03918 Unspecified dementia, unspecified severity, with other behavioral disturbance: Secondary | ICD-10-CM

## 2022-11-28 DIAGNOSIS — I480 Paroxysmal atrial fibrillation: Secondary | ICD-10-CM

## 2022-11-28 DIAGNOSIS — E039 Hypothyroidism, unspecified: Secondary | ICD-10-CM

## 2022-11-28 DIAGNOSIS — J069 Acute upper respiratory infection, unspecified: Secondary | ICD-10-CM | POA: Diagnosis not present

## 2022-11-28 NOTE — Assessment & Plan Note (Signed)
taking Levothyroxine, TSH 2.82 06/13/22 

## 2022-11-28 NOTE — Assessment & Plan Note (Signed)
Senile dementia,  off  Donepezil, Memantine, on Depakote, prn Lorazepam, followed by Neurology, Hospice for supportive care, MMSE 2/30 06/07/22 11/28/22 will reduce Depakote to daily in setting of lethargy.

## 2022-11-28 NOTE — Assessment & Plan Note (Addendum)
increased fatigue, staying in bed for most of the day 11/27/22, clear nasal drainage/nasal congestion, dry cough noted, negative COVID.  Will obtain CXR ap/lateral, update CBC/diff, CMP/eGFR, try Claritin 10mg  qd x 7 days.  11/28/22 CXR no acute cardiopulmonary disease.  11/29/22 wbc 5.9, Hgb 13.0, plt 213, neutrophils 47.3, Na 144, K 4.6, Bun 23, creat 1.54

## 2022-11-28 NOTE — Assessment & Plan Note (Signed)
heart rate is in control, off  Eliquis, Hgb 14.0 08/13/22

## 2022-11-28 NOTE — Progress Notes (Signed)
Location:  [place] Room Number: 110A Place of Service:  SNF (31) Provider:  Shavawn Stobaugh X, NP   Stavros Cail X, NP  Patient Care Team: Lindley Hiney X, NP as PCP - General (Internal Medicine) Sueanne Margarita, MD as PCP - Cardiology (Cardiology) Seward Carol, MD (Internal Medicine)  Extended Emergency Contact Information Primary Emergency Contact: [name],[name] Address: [address]          [place], [place] [address] [name] of [place] Phone: [phone] Mobile Phone: [phone] Relation: Spouse Secondary Emergency Contact: [name],[name] Mobile Phone: [phone] Relation: Son  Code Status:  DNR  Goals of care: Advanced Directive information    11/28/2022   10:48 AM  Advanced Directives  Does Patient Have a Medical Advance Directive? Yes  Type of Advance Directive Living will;Out of facility DNR (pink MOST or yellow form)  Does patient want to make changes to medical advance directive? No - Patient declined     Chief Complaint  Patient presents with   Acute Visit    Patient is having some excessive sleeping    Quality Metric Gaps    Patient is due for an Eye exam    HPI:  Pt is a 89 y.o. male seen today for an acute visit for increased fatigue, staying in bed for most of the day 11/27/22, clear nasal drainage/nasal congestion, dry cough noted, negative COVID.      Dysphagia III diet Senile dementia,  off  Donepezil, Memantine, on Depakote, prn Lorazepam, followed by Neurology, Hospice for supportive care, MMSE 2/30 06/07/22             Adult Failure To Thrive, weight loss, followed by dietary             Afib followed by Cardiology, heart rate is in control, off  Eliquis, Hgb 14.0 08/13/22             CAD, on Statin, prn NTG             CHFrEF, off Farxiga, EF 30-35%             CHB s/p PPM             T2DM, on diet, Hgb a1c 9.5 06/29/22             Hypothyroidism, taking Levothyroxine, TSH 2.82 06/13/22              Hyperlipidemia, takes Atorvastatin, LDL 60 06/13/22             HTN, off Valsartan             CKD chronic, Bun/creat 22/1.4 08/13/22 Past Medical History:  Diagnosis Date   Allergic rhinitis, cause unspecified    BPH (benign prostatic hyperplasia)    Carotid artery stenosis, asymptomatic    1-39% by dopplers 09/2016 followed by Dr. Oneida Alar   Cataracts, bilateral    Chronic kidney disease (CKD), stage III (moderate) ([place])    Complete heart block ([place]) 01/23/04   s/p Medtronic PPM implanted by Dr Leonia Reeves   COPD (chronic obstructive pulmonary disease) ([place])    Diabetes mellitus    Glaucoma    Hyperlipidemia    Hypertension    Insomnia due to medical condition 08/10/2014   Kidney stones    PAF (paroxysmal atrial fibrillation) ([place]) 10/09/2014   Noted on pacer check with 88 mode switches and longest episode >1 hour.  Now on Apixiban for CHADS2VASC score of 5   Past Surgical  History:  Procedure Laterality Date   PACEMAKER INSERTION  2005, 04/16/12   MDT implanted by Dr Leonia Reeves with generator chnage (MDT Adapta L) by Dr Rayann Heman 04/16/12   PERMANENT PACEMAKER GENERATOR CHANGE N/A 04/16/2012   Procedure: PERMANENT PACEMAKER GENERATOR CHANGE;  Surgeon: Thompson Grayer, MD;  Location: [facility] CATH LAB;  Service: Cardiovascular;  Laterality: N/A;   RIGHT/LEFT HEART CATH AND CORONARY ANGIOGRAPHY N/A 10/18/2020   Procedure: RIGHT/LEFT HEART CATH AND CORONARY ANGIOGRAPHY;  Surgeon: Troy Sine, MD;  Location: [place] CV LAB;  Service: Cardiovascular;  Laterality: N/A;    Allergies  Allergen Reactions   Penicillins Rash and Other (See Comments)    Childhood allergy    Irbesartan     Per [facility]   Penicillin G Benzathine Rash    Outpatient Encounter Medications as of 11/28/2022  Medication Sig   acetaminophen (TYLENOL) 325 MG tablet Take 2 tablets (650 mg total) by mouth every 6 (six) hours as needed for mild pain or headache.   atorvastatin (LIPITOR) 80 MG tablet TAKE ONE TABLET BY MOUTH EVERY EVENING  (Patient taking differently: Take 80 mg by mouth daily.)   divalproex (DEPAKOTE SPRINKLE) 125 MG capsule Take 250 mg by mouth 2 (two) times daily.   Docusate Sodium (DOCU LIQUID PO) Take 10 mLs by mouth 2 (two) times daily.   escitalopram (LEXAPRO) 10 MG tablet Take 10 mg by mouth daily.   haloperidol (HALDOL) 2 MG tablet Take 2 mg by mouth as needed for agitation (Or anxiety). *Give either the tablet or IM. Can not be given together.*   haloperidol lactate (HALDOL) 5 MG/ML injection Inject 2 mg into the muscle as needed (For agitation or anxiety). *Give either the tablet or IM. Can not be given together.*   ipratropium-albuterol (DUONEB) 0.5-2.5 (3) MG/3ML SOLN Take 3 mLs by nebulization every 8 (eight) hours as needed.   lactose free nutrition (BOOST) LIQD Take 237 mLs by mouth 2 (two) times daily between meals.   levothyroxine (SYNTHROID) 75 MCG tablet Take 75 mcg by mouth daily before breakfast.   LORazepam (ATIVAN) 2 MG/ML injection Inject 2 mg into the muscle every 8 (eight) hours as needed for anxiety (Agitation). Give either the gel or IM. Cannot be given together.   nitroGLYCERIN (NITROSTAT) 0.4 MG SL tablet Place 1 tablet (0.4 mg total) under the tongue every 5 (five) minutes as needed for chest pain.   senna (SENOKOT) 8.6 MG TABS tablet Take 2 tablets by mouth daily as needed for mild constipation.   isosorbide mononitrate (IMDUR) 30 MG 24 hr tablet TAKE 1/2 TABLET BY MOUTH EVERY MORNING (Patient not taking: Reported on 11/28/2022)   LORazepam (LORAZEPAM INTENSOL) 2 MG/ML concentrated solution Apply '1mg'$ /ml gel TOPICALLY three times a day (Patient not taking: Reported on 11/28/2022)   memantine (NAMENDA) 10 MG tablet Take 1 tablet (10 mg total) by mouth 2 (two) times daily. (Patient not taking: Reported on 11/28/2022)   risperiDONE (RISPERDAL) 0.25 MG tablet Take 1 tablet (0.25 mg total) by mouth 2 (two) times daily. (Patient not taking: Reported on 11/28/2022)   [DISCONTINUED] apixaban  (ELIQUIS) 2.5 MG TABS tablet TAKE ONE TABLET BY MOUTH TWICE DAILY (Patient not taking: Reported on 11/28/2022)   [DISCONTINUED] docusate sodium (COLACE) 100 MG capsule Take 100 mg by mouth 2 (two) times daily.   No facility-administered encounter medications on file as of 11/28/2022.    Review of Systems  Immunization History  Administered Date(s) Administered   Influenza-Unspecified 07/04/2014, 08/05/2022   Moderna Covid-19  Vaccine Bivalent Booster 3yrs & up 07/18/2022   Moderna SARS-COV2 Booster Vaccination 07/25/2020, 02/13/2021   Moderna Sars-Covid-2 Vaccination 09/20/2019, 10/18/2019   PNEUMOCOCCAL CONJUGATE-20 08/07/2022   Pfizer Covid-19 Vaccine Bivalent Booster 59yrs & up 06/20/2021   Respiratory Syncytial Virus Vaccine,Recomb Aduvanted(Arexvy) 10/04/2022   Tdap 10/28/2022   Zoster Recombinat (Shingrix) 11/06/2017, 03/28/2018   Pertinent  Health Maintenance Due  Topic Date Due   OPHTHALMOLOGY EXAM  Never done   HEMOGLOBIN A1C  12/29/2022   FOOT EXAM  10/19/2023   INFLUENZA VACCINE  Completed      07/03/2022    8:00 AM 07/05/2022    4:50 PM 10/18/2022   11:21 AM 11/04/2022    2:33 PM 11/15/2022    8:54 AM  Fall Risk  Falls in the past year?  0 0 0 0  Was there an injury with Fall?  0 0 0 0  Fall Risk Category Calculator  0 0 0 0  Fall Risk Category (Retired)  Low     (RETIRED) Patient Fall Risk Level High fall risk High fall risk     Patient at Risk for Falls Due to  History of fall(s) No Fall Risks  No Fall Risks  Fall risk Follow up  Falls evaluation completed Falls evaluation completed Falls evaluation completed Falls evaluation completed   Functional Status Survey:    Vitals:   11/28/22 1033  BP: 120/68  Pulse: 73  Resp: 20  Temp: 98.4 F (36.9 C)  TempSrc: Temporal  SpO2: 96%  Weight: 148 lb (67.1 kg)  Height: 5\' 6"  (1.676 m)   Body mass index is 23.89 kg/m. Physical Exam  Labs reviewed: Recent Labs    06/29/22 2006 07/01/22 0238 07/02/22 0242  08/13/22 0000  NA 143 140 142 144  K 4.5 3.9 3.6 3.8  CL 111 108 108 109*  CO2 21* 21* 25 27*  GLUCOSE 209* 199* 112*  --   BUN 22 34* 27* 22*  CREATININE 1.67* 1.76* 1.65* 1.4*  CALCIUM 8.8* 8.5* 8.8* 8.9   Recent Labs    06/29/22 1545 08/13/22 0000  AST 25 15  ALT 29 19  ALKPHOS 77 80  BILITOT 0.5  --   PROT 6.2*  --   ALBUMIN 3.6 4.0   Recent Labs    06/29/22 1545 06/29/22 1605 07/01/22 0238 07/02/22 0242 08/13/22 0000  WBC 6.4  --  6.4 4.8 5.5  NEUTROABS 4.8  --   --   --  2,690.00  HGB 14.4   < > 14.3 14.9 14.0  HCT 44.5   < > 43.3 45.7 44  MCV 82.9  --  81.5 81.5  --   PLT 222  --  213 229 246   < > = values in this interval not displayed.   No results found for: "TSH" Lab Results  Component Value Date   HGBA1C 9.5 (H) 06/29/2022   Lab Results  Component Value Date   CHOL 128 12/16/2016   HDL 55 12/16/2016   LDLCALC 58 12/16/2016   TRIG 97 06/29/2022   CHOLHDL 2.3 12/16/2016    Significant Diagnostic Results in last 30 days:  No results found.  Assessment/Plan Upper respiratory infection increased fatigue, staying in bed for most of the day 11/27/22, clear nasal drainage/nasal congestion, dry cough noted, negative COVID.  Will obtain CXR ap/lateral, update CBC/diff, CMP/eGFR, try Claritin 10mg  qd x 7 days.  11/28/22 CXR no acute cardiopulmonary disease.  11/29/22 wbc 5.9, Hgb 13.0, plt 213, neutrophils 47.3,  Na 144, K 4.6, Bun 23, creat 1.54  Senile dementia with behavioral disturbance (HCC) Senile dementia,  off  Donepezil, Memantine, on Depakote, prn Lorazepam, followed by Neurology, Hospice for supportive care, MMSE 2/30 06/07/22 11/28/22 will reduce Depakote to daily in setting of lethargy.   PAF (paroxysmal atrial fibrillation) (HCC)  heart rate is in control, off  Eliquis, Hgb 14.0 08/13/22  Hypothyroidism  taking Levothyroxine, TSH 2.82 06/13/22     Family/ staff Communication: plan of care reviewed with the patient and charge nurse.    Labs/tests ordered:  CXR ap/lateral, CBC/diff, CMP/eGFR

## 2022-11-29 ENCOUNTER — Encounter: Payer: Self-pay | Admitting: Nurse Practitioner

## 2022-11-29 DIAGNOSIS — R4182 Altered mental status, unspecified: Secondary | ICD-10-CM | POA: Diagnosis not present

## 2022-11-29 DIAGNOSIS — I10 Essential (primary) hypertension: Secondary | ICD-10-CM | POA: Diagnosis not present

## 2022-12-24 ENCOUNTER — Non-Acute Institutional Stay (SKILLED_NURSING_FACILITY): Admitting: Family Medicine

## 2022-12-24 DIAGNOSIS — I442 Atrioventricular block, complete: Secondary | ICD-10-CM | POA: Diagnosis not present

## 2022-12-24 DIAGNOSIS — F03918 Unspecified dementia, unspecified severity, with other behavioral disturbance: Secondary | ICD-10-CM

## 2022-12-24 DIAGNOSIS — E7800 Pure hypercholesterolemia, unspecified: Secondary | ICD-10-CM | POA: Diagnosis not present

## 2022-12-24 DIAGNOSIS — R1310 Dysphagia, unspecified: Secondary | ICD-10-CM | POA: Diagnosis not present

## 2022-12-24 DIAGNOSIS — E0849 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: Secondary | ICD-10-CM

## 2022-12-24 DIAGNOSIS — R627 Adult failure to thrive: Secondary | ICD-10-CM

## 2022-12-24 DIAGNOSIS — F0394 Unspecified dementia, unspecified severity, with anxiety: Secondary | ICD-10-CM

## 2022-12-24 NOTE — Progress Notes (Signed)
Location:     Place of Service:   [facility]  Provider: Rondel BatonS Jo-Anne Kluth  Code Status:  Goals of Care:     11/28/2022   10:48 AM  Advanced Directives  Does Patient Have a Medical Advance Directive? Yes  Type of Advance Directive Living will;Out of facility DNR (pink MOST or yellow form)  Does patient want to make changes to medical advance directive? No - Patient declined     No chief complaint on file.   HPI: Patient is a 89 y.o. male seen today for an routine visit for: Dementia with behaviors, adult failure to thrive, A-fib, type 2 diabetes on diet, hypothyroidism, and hyperlipidemia.  We had tried to remove some of his behavior controlling medicines such as Ativan gel without success.  He is back on gel now, as well as risperidone 0.25 mg scheduled and as needed Haldol IM.  Also scheduled Depakote twice daily.  Nursing reports no recent behavioral difficulties on the above medicines.  Will try to discontinue Haldol.  Mention that he is also on memantine and Lexapro. He sleeps most of the time sitting up in chair or sofa.  Verbal communication is minimal.  Last MMSE 2/30, and September 23. He is followed by hospice.  I would be in favor of him discontinuing the atorvastatin.  Other medicines such as valsartan and Farxiga have already been discontinued  Past Medical History:  Diagnosis Date   Allergic rhinitis, cause unspecified    BPH (benign prostatic hyperplasia)    Carotid artery stenosis, asymptomatic    1-39% by dopplers 09/2016 followed by Dr. Darrick PennaFields   Cataracts, bilateral    Chronic kidney disease (CKD), stage III (moderate) (HCC)    Complete heart block (HCC) 01/23/04   s/p Medtronic PPM implanted by Dr Amil AmenEdmunds   COPD (chronic obstructive pulmonary disease) (HCC)    Diabetes mellitus    Glaucoma    Hyperlipidemia    Hypertension    Insomnia due to medical condition 08/10/2014   Kidney stones    PAF (paroxysmal atrial fibrillation) (HCC) 10/09/2014    Noted on pacer check with 88 mode switches and longest episode >1 hour.  Now on Apixiban for CHADS2VASC score of 5    Past Surgical History:  Procedure Laterality Date   PACEMAKER INSERTION  2005, 04/16/12   MDT implanted by Dr Amil AmenEdmunds with generator chnage (MDT Adapta L) by Dr Johney FrameAllred 04/16/12   PERMANENT PACEMAKER GENERATOR CHANGE N/A 04/16/2012   Procedure: PERMANENT PACEMAKER GENERATOR CHANGE;  Surgeon: Hillis RangeJames Allred, MD;  [facility];  Service: Cardiovascular;  Laterality: N/A;   RIGHT/LEFT HEART CATH AND CORONARY ANGIOGRAPHY N/A 10/18/2020   Procedure: RIGHT/LEFT HEART CATH AND CORONARY ANGIOGRAPHY;  Surgeon: Lennette BihariKelly, Thomas A, MD;  Location: [facility] INVASIVE CV LAB;  Service: Cardiovascular;  Laterality: N/A;    Allergies  Allergen Reactions   Penicillins Rash and Other (See Comments)    Childhood allergy    Irbesartan     [facility]   Penicillin G Benzathine Rash    Outpatient Encounter Medications as of 12/24/2022  Medication Sig   acetaminophen (TYLENOL) 325 MG tablet Take 2 tablets (650 mg total) by mouth every 6 (six) hours as needed for mild pain or headache.   atorvastatin (LIPITOR) 80 MG tablet TAKE ONE TABLET BY MOUTH EVERY EVENING (Patient taking differently: Take 80 mg by mouth daily.)   divalproex (DEPAKOTE SPRINKLE) 125 MG capsule Take 250 mg by mouth 2 (two) times daily.   Docusate  Sodium (DOCU LIQUID PO) Take 10 mLs by mouth 2 (two) times daily.   escitalopram (LEXAPRO) 10 MG tablet Take 10 mg by mouth daily.   haloperidol (HALDOL) 2 MG tablet Take 2 mg by mouth as needed for agitation (Or anxiety). *Give either the tablet or IM. Can not be given together.*   haloperidol lactate (HALDOL) 5 MG/ML injection Inject 2 mg into the muscle as needed (For agitation or anxiety). *Give either the tablet or IM. Can not be given together.*   ipratropium-albuterol (DUONEB) 0.5-2.5 (3) MG/3ML SOLN Take 3 mLs by nebulization every 8 (eight) hours as needed.   isosorbide mononitrate (IMDUR)  30 MG 24 hr tablet TAKE 1/2 TABLET BY MOUTH EVERY MORNING (Patient not taking: Reported on 11/28/2022)   lactose free nutrition (BOOST) LIQD Take 237 mLs by mouth 2 (two) times daily between meals.   levothyroxine (SYNTHROID) 75 MCG tablet Take 75 mcg by mouth daily before breakfast.   LORazepam (ATIVAN) 2 MG/ML injection Inject 2 mg into the muscle every 8 (eight) hours as needed for anxiety (Agitation). Give either the gel or IM. Cannot be given together.   LORazepam (LORAZEPAM INTENSOL) 2 MG/ML concentrated solution Apply 1mg /ml gel TOPICALLY three times a day (Patient not taking: Reported on 11/28/2022)   memantine (NAMENDA) 10 MG tablet Take 1 tablet (10 mg total) by mouth 2 (two) times daily. (Patient not taking: Reported on 11/28/2022)   nitroGLYCERIN (NITROSTAT) 0.4 MG SL tablet Place 1 tablet (0.4 mg total) under the tongue every 5 (five) minutes as needed for chest pain.   risperiDONE (RISPERDAL) 0.25 MG tablet Take 1 tablet (0.25 mg total) by mouth 2 (two) times daily. (Patient not taking: Reported on 11/28/2022)   senna (SENOKOT) 8.6 MG TABS tablet Take 2 tablets by mouth daily as needed for mild constipation.   No facility-administered encounter medications on file as of 12/24/2022.    Review of Systems:  Review of Systems  Unable to perform ROS: Dementia    Health Maintenance  Topic Date Due   OPHTHALMOLOGY EXAM  Never done   COVID-19 Vaccine (7 - 2023-24 season) 09/12/2022   HEMOGLOBIN A1C  12/29/2022   INFLUENZA VACCINE  04/17/2023   FOOT EXAM  10/19/2023   DTaP/Tdap/Td (2 - Td or Tdap) 10/28/2032   Pneumonia Vaccine 42+ Years old  Completed   Zoster Vaccines- Shingrix  Completed   HPV VACCINES  Aged Out    Physical Exam: There were no vitals filed for this visit. There is no height or weight on file to calculate BMI. Physical Exam Vitals and nursing note reviewed.  Constitutional:      Appearance: Normal appearance.  Cardiovascular:     Rate and Rhythm: Normal rate  and regular rhythm.  Pulmonary:     Effort: Pulmonary effort is normal.     Breath sounds: Normal breath sounds.  Abdominal:     General: Bowel sounds are normal.     Palpations: Abdomen is soft.  Musculoskeletal:     Right lower leg: No edema.     Left lower leg: No edema.  Neurological:     General: No focal deficit present.     Mental Status: He is alert.     Comments: Oriented to self  Psychiatric:        Mood and Affect: Mood normal.        Behavior: Behavior normal.     Labs reviewed: Basic Metabolic Panel: Recent Labs    06/29/22 2006 07/01/22 0238 07/02/22  0242 08/13/22 0000  NA 143 140 142 144  K 4.5 3.9 3.6 3.8  CL 111 108 108 109*  CO2 21* 21* 25 27*  GLUCOSE 209* 199* 112*  --   BUN 22 34* 27* 22*  CREATININE 1.67* 1.76* 1.65* 1.4*  CALCIUM 8.8* 8.5* 8.8* 8.9   Liver Function Tests: Recent Labs    06/29/22 1545 08/13/22 0000  AST 25 15  ALT 29 19  ALKPHOS 77 80  BILITOT 0.5  --   PROT 6.2*  --   ALBUMIN 3.6 4.0   No results for input(s): "LIPASE", "AMYLASE" in the last 8760 hours. No results for input(s): "AMMONIA" in the last 8760 hours. CBC: Recent Labs    06/29/22 1545 06/29/22 1605 07/01/22 0238 07/02/22 0242 08/13/22 0000  WBC 6.4  --  6.4 4.8 5.5  NEUTROABS 4.8  --   --   --  2,690.00  HGB 14.4   < > 14.3 14.9 14.0  HCT 44.5   < > 43.3 45.7 44  MCV 82.9  --  81.5 81.5  --   PLT 222  --  213 229 246   < > = values in this interval not displayed.   Lipid Panel: Recent Labs    06/29/22 1545  TRIG 97   Lab Results  Component Value Date   HGBA1C 9.5 (H) 06/29/2022    Procedures since last visit: No results found.  Assessment/Plan 1. Adult failure to thrive Weight has actually increased over the past 6 months from 138 to 148 pounds  2. 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 Atrioventricular block, complete Dortha Kern  4. 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  Diet is used to control but A1c is 9.5.  I think given the fact that he is on hospice 9.5 is reasonable  5. Dysphagia, unspecified type Continues on dysphagia 3 diet.  No issues with choking or coughing with meals  6. Hypercholesterolemia without hypertriglyceridemia Recommend discontinuing atorvastatin if family agrees  7. Senile dementia with behavioral disturbance Hospice appropriate although failure to thrive probably not a diagnosis for now.  Continue with psychotropics as listed above    Labs/tests ordered:  * No order type specified * Next appt:  Visit date not found Bertram Millard. Hyacinth Meeker, MD [facility] [address] [place], [place] [address] Office [phone]

## 2022-12-26 ENCOUNTER — Non-Acute Institutional Stay (SKILLED_NURSING_FACILITY): Admitting: Nurse Practitioner

## 2022-12-26 ENCOUNTER — Encounter: Payer: Self-pay | Admitting: Nurse Practitioner

## 2022-12-26 DIAGNOSIS — L03031 Cellulitis of right toe: Secondary | ICD-10-CM | POA: Insufficient documentation

## 2022-12-26 DIAGNOSIS — E0849 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: Secondary | ICD-10-CM | POA: Diagnosis not present

## 2022-12-26 DIAGNOSIS — B351 Tinea unguium: Secondary | ICD-10-CM | POA: Diagnosis not present

## 2022-12-26 DIAGNOSIS — F03918 Unspecified dementia, unspecified severity, with other behavioral disturbance: Secondary | ICD-10-CM | POA: Diagnosis not present

## 2022-12-26 DIAGNOSIS — I480 Paroxysmal atrial fibrillation: Secondary | ICD-10-CM | POA: Diagnosis not present

## 2022-12-26 NOTE — Assessment & Plan Note (Signed)
heart rate is in control, off  Eliquis, Hgb 14.0 08/13/22 

## 2022-12-26 NOTE — Assessment & Plan Note (Signed)
taking Levothyroxine, TSH 2.82 06/13/22 

## 2022-12-26 NOTE — Progress Notes (Signed)
Location:  [facility] Room Number: 110 A Place of Service:  SNF (31) Provider:  Urho Rio X, NP  Patient Care Team: Venita Seng X, NP as PCP - General (Internal Medicine) Quintella Reichert, MD as PCP - Cardiology (Cardiology) Renford Dills, MD (Internal Medicine)  Extended Emergency Contact Information Primary Emergency Contact: Vos,[name] Address: [address]          [place], [place] [address] [place] of [place] Home Phone: [phone] Mobile Phone: [phone] Relation: Spouse Secondary Emergency Contact: Tuft,[name] Mobile Phone: [phone] Relation: Son  Code Status:  DNR Goals of care: Advanced Directive information    12/26/2022   11:30 AM  Advanced Directives  Does Patient Have a Medical Advance Directive? Yes  Type of Advance Directive Living will;Out of facility DNR (pink MOST or yellow form)  Does patient want to make changes to medical advance directive? No - Patient declined  Pre-existing out of facility DNR order (yellow form or pink MOST form) Pink MOST/Yellow Form most recent copy in chart - Physician notified to receive inpatient order     Chief Complaint  Patient presents with   Acute Visit    Right great toe nail base redness    HPI:  Pt is a 89 y.o. male seen today for an acute visit for reported redness to the right great toe, noted redness, warmth, tenderness at the base of the right great toe nail. Also noted thick yellow long toe nails.     Dysphagia III diet Senile dementia,  off  Donepezil, Memantine, on Depakote, prn Lorazepam, followed by Neurology, Hospice for supportive care, MMSE 2/30 06/07/22             Adult Failure To Thrive, weight loss, followed by dietary             Afib followed by Cardiology, heart rate is in control, off  Eliquis, Hgb 14.0 08/13/22             CAD, on Statin, prn NTG             CHFrEF, off Farxiga, EF 30-35%             CHB s/p PPM             T2DM, on diet, Hgb a1c 9.5  06/29/22             Hypothyroidism, taking Levothyroxine, TSH 2.82 06/13/22             Hyperlipidemia, takes Atorvastatin, LDL 60 06/13/22             HTN, off Valsartan             CKD chronic, Bun/creat 22/1.4 08/13/22 Past Medical History:  Diagnosis Date   Allergic rhinitis, cause unspecified    BPH (benign prostatic hyperplasia)    Carotid artery stenosis, asymptomatic    1-39% by dopplers 09/2016 followed by Dr. Darrick Penna   Cataracts, bilateral    Chronic kidney disease (CKD), stage III (moderate)    Complete heart block 01/23/04   s/p Medtronic PPM implanted by Dr Amil Amen   COPD (chronic obstructive pulmonary disease)    Diabetes mellitus    Glaucoma    Hyperlipidemia    Hypertension    Insomnia due to medical condition 08/10/2014   Kidney stones    PAF (paroxysmal atrial fibrillation) 10/09/2014   Noted on pacer check with 88 mode switches and longest episode >1 hour.  Now on Apixiban for  CHADS2VASC score of 5   Past Surgical History:  Procedure Laterality Date   PACEMAKER INSERTION  2005, 04/16/12   MDT implanted by Dr Amil Amen with generator chnage (MDT Adapta L) by Dr Johney Frame 04/16/12   PERMANENT PACEMAKER GENERATOR CHANGE N/A 04/16/2012   Procedure: PERMANENT PACEMAKER GENERATOR CHANGE;  Surgeon: Hillis Range, MD;  Location: [facility] CATH LAB;  Service: Cardiovascular;  Laterality: N/A;   RIGHT/LEFT HEART CATH AND CORONARY ANGIOGRAPHY N/A 10/18/2020   Procedure: RIGHT/LEFT HEART CATH AND CORONARY ANGIOGRAPHY;  Surgeon: Lennette Bihari, MD;  Location: [facility] INVASIVE CV LAB;  Service: Cardiovascular;  Laterality: N/A;    Allergies  Allergen Reactions   Penicillins Rash and Other (See Comments)    Childhood allergy    Irbesartan     Per [facility]   Penicillin G Benzathine Rash    Outpatient Encounter Medications as of 12/26/2022  Medication Sig   acetaminophen (TYLENOL) 325 MG tablet Take 2 tablets (650 mg total) by mouth every 6 (six) hours as needed for mild pain or headache.   atorvastatin  (LIPITOR) 80 MG tablet TAKE ONE TABLET BY MOUTH EVERY EVENING   divalproex (DEPAKOTE SPRINKLE) 125 MG capsule Take 250 mg by mouth 2 (two) times daily.   Docusate Sodium (DOCU LIQUID PO) Take 10 mLs by mouth 2 (two) times daily.   escitalopram (LEXAPRO) 10 MG tablet Take 10 mg by mouth daily.   haloperidol (HALDOL) 2 MG tablet Take 2 mg by mouth as needed for agitation (Or anxiety). *Give either the tablet or IM. Can not be given together.*   haloperidol lactate (HALDOL) 5 MG/ML injection Inject 2 mg into the muscle as needed (For agitation or anxiety). *Give either the tablet or IM. Can not be given together.*   ipratropium-albuterol (DUONEB) 0.5-2.5 (3) MG/3ML SOLN Take 3 mLs by nebulization every 8 (eight) hours as needed.   lactose free nutrition (BOOST) LIQD Take 237 mLs by mouth 2 (two) times daily between meals.   levothyroxine (SYNTHROID) 75 MCG tablet Take 75 mcg by mouth daily before breakfast.   LORazepam (ATIVAN) 2 MG/ML injection Inject 2 mg into the muscle every 8 (eight) hours as needed for anxiety (Agitation). Give either the gel or IM. Cannot be given together.   nitroGLYCERIN (NITROSTAT) 0.4 MG SL tablet Place 1 tablet (0.4 mg total) under the tongue every 5 (five) minutes as needed for chest pain.   senna (SENOKOT) 8.6 MG TABS tablet Take 2 tablets by mouth daily as needed for mild constipation.   [DISCONTINUED] isosorbide mononitrate (IMDUR) 30 MG 24 hr tablet TAKE 1/2 TABLET BY MOUTH EVERY MORNING (Patient not taking: Reported on 11/28/2022)   [DISCONTINUED] LORazepam (LORAZEPAM INTENSOL) 2 MG/ML concentrated solution Apply 1mg /ml gel TOPICALLY three times a day (Patient not taking: Reported on 11/28/2022)   [DISCONTINUED] memantine (NAMENDA) 10 MG tablet Take 1 tablet (10 mg total) by mouth 2 (two) times daily. (Patient not taking: Reported on 11/28/2022)   [DISCONTINUED] risperiDONE (RISPERDAL) 0.25 MG tablet Take 1 tablet (0.25 mg total) by mouth 2 (two) times daily. (Patient not  taking: Reported on 11/28/2022)   No facility-administered encounter medications on file as of 12/26/2022.    Review of Systems  Unable to perform ROS: Dementia    Immunization History  Administered Date(s) Administered   Influenza-Unspecified 07/04/2014, 08/05/2022   Moderna Covid-19 Vaccine Bivalent Booster 21yrs & up 07/18/2022   Moderna SARS-COV2 Booster Vaccination 07/25/2020, 02/13/2021   Moderna Sars-Covid-2 Vaccination 09/20/2019, 10/18/2019   PNEUMOCOCCAL CONJUGATE-20 08/07/2022  [employer], [employer] 28yrs & up 06/20/2021   Respiratory Syncytial Virus Vaccine,Recomb Aduvanted(Arexvy) 10/04/2022   Tdap 10/28/2022   Zoster Recombinat (Shingrix) 11/06/2017, 03/28/2018   Pertinent  Health Maintenance Due  Topic Date Due   OPHTHALMOLOGY EXAM  Never done   HEMOGLOBIN A1C  12/29/2022   INFLUENZA VACCINE  04/17/2023   FOOT EXAM  10/19/2023      07/05/2022    4:50 PM 10/18/2022   11:21 AM 11/04/2022    2:33 PM 11/15/2022    8:54 AM 12/26/2022   11:29 AM  Fall Risk  Falls in the past year? 0 0 0 0 0  Was there an injury with Fall? 0 0 0 0 0  Fall Risk Category Calculator 0 0 0 0 0  Fall Risk Category (Retired) Low      (RETIRED) Patient Fall Risk Level High fall risk      Patient at Risk for Falls Due to History of fall(s) No Fall Risks  No Fall Risks No Fall Risks  Fall risk Follow up Falls evaluation completed Falls evaluation completed Falls evaluation completed Falls evaluation completed Falls evaluation completed   Functional Status Survey:    Vitals:   12/26/22 1120  BP: 127/68  Pulse: 72  Resp: 18  Temp: 97.6 F (36.4 C)  SpO2: 98%  Weight: 148 lb 8 oz (67.4 kg)  Height: 5\' 6"  (1.676 m)   Body mass index is 23.97 kg/m. Physical Exam Vitals and nursing note reviewed.  Constitutional:      Appearance: Normal appearance.  HENT:     Head: Normocephalic and atraumatic.     Nose: Nose normal.     Mouth/Throat:     Mouth: Mucous  membranes are moist.  Eyes:     Extraocular Movements: Extraocular movements intact.     Conjunctiva/sclera: Conjunctivae normal.     Pupils: Pupils are equal, round, and reactive to light.  Cardiovascular:     Rate and Rhythm: Normal rate and regular rhythm.     Heart sounds: No murmur heard. Pulmonary:     Effort: Pulmonary effort is normal.     Breath sounds: No rales.  Abdominal:     General: Bowel sounds are normal.     Palpations: Abdomen is soft.     Tenderness: There is no abdominal tenderness.  Genitourinary:    Comments: External hemorrhoids from previous examination.  Musculoskeletal:     Cervical back: Normal range of motion and neck supple.     Right lower leg: No edema.     Left lower leg: No edema.  Skin:    General: Skin is warm and dry.     Findings: Erythema present.     Comments: redness to the right great toe, noted redness, warmth, tenderness at the base of the right great toe nail.  Neurological:     General: No focal deficit present.     Mental Status: He is alert. Mental status is at baseline.     Gait: Gait abnormal.     Comments: Oriented to person  Psychiatric:     Comments: Flat affect, non cooperative.      Labs reviewed: Recent Labs    06/29/22 2006 07/01/22 0238 07/02/22 0242 08/13/22 0000  NA 143 140 142 144  K 4.5 3.9 3.6 3.8  CL 111 108 108 109*  CO2 21* 21* 25 27*  GLUCOSE 209* 199* 112*  --   BUN 22 34* 27* 22*  CREATININE 1.67* 1.76* 1.65*  1.4*  CALCIUM 8.8* 8.5* 8.8* 8.9   Recent Labs    06/29/22 1545 08/13/22 0000  AST 25 15  ALT 29 19  ALKPHOS 77 80  BILITOT 0.5  --   PROT 6.2*  --   ALBUMIN 3.6 4.0   Recent Labs    06/29/22 1545 06/29/22 1605 07/01/22 0238 07/02/22 0242 08/13/22 0000  WBC 6.4  --  6.4 4.8 5.5  NEUTROABS 4.8  --   --   --  2,690.00  HGB 14.4   < > 14.3 14.9 14.0  HCT 44.5   < > 43.3 45.7 44  MCV 82.9  --  81.5 81.5  --   PLT 222  --  213 229 246   < > = values in this interval not  displayed.   No results found for: "TSH" Lab Results  Component Value Date   HGBA1C 9.5 (H) 06/29/2022   Lab Results  Component Value Date   CHOL 128 12/16/2016   HDL 55 12/16/2016   LDLCALC 58 12/16/2016   TRIG 97 06/29/2022   CHOLHDL 2.3 12/16/2016    Significant Diagnostic Results in last 30 days:  No results found.  Assessment/Plan Paronychia of great toe of right foot noted redness, warmth, tenderness at the base of the right great toe nail, Bactroban oint daily to the base of the right great toe nail, Epson salt bath R toes daily until healed.   Onychomycosis of toenail Toe nails, referral to podiatrist for nail care.   Senile dementia with behavioral disturbance (HCC) off  Donepezil, Memantine, on Depakote, prn Lorazepam, followed by Neurology, Hospice for supportive care, MMSE 2/30 06/07/22  PAF (paroxysmal atrial fibrillation) (HCC)  heart rate is in control, off  Eliquis, Hgb 14.0 08/13/22  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 (HCC)  taking Levothyroxine, TSH 2.82 06/13/22     Family/ staff Communication: plan of care reviewed with the patient and charge nurse.   Labs/tests ordered:  none  Time spend 35 minutes.

## 2022-12-26 NOTE — Assessment & Plan Note (Signed)
off  Donepezil, Memantine, on Depakote, prn Lorazepam, followed by Neurology, Hospice for supportive care, MMSE 2/30 06/07/22 

## 2022-12-26 NOTE — Assessment & Plan Note (Signed)
noted redness, warmth, tenderness at the base of the right great toe nail, Bactroban oint daily to the base of the right great toe nail, Epson salt bath R toes daily until healed.

## 2022-12-26 NOTE — Assessment & Plan Note (Signed)
Toe nails, referral to podiatrist for nail care.

## 2023-01-04 LAB — CBC: RBC: 3.83 — AB (ref 3.87–5.11)

## 2023-01-04 LAB — BASIC METABOLIC PANEL
BUN: 48 — AB (ref 4–21)
CO2: 21 (ref 13–22)
Chloride: 106 (ref 99–108)
Creatinine: 1.7 — AB (ref 0.6–1.3)
Glucose: 118
Potassium: 3.9 mEq/L (ref 3.5–5.1)
Sodium: 140 (ref 137–147)

## 2023-01-04 LAB — CBC AND DIFFERENTIAL
HCT: 35 — AB (ref 41–53)
Hemoglobin: 11.7 — AB (ref 13.5–17.5)
Platelets: 168 10*3/uL (ref 150–400)
WBC: 7

## 2023-01-04 LAB — COMPREHENSIVE METABOLIC PANEL
Calcium: 8.9 (ref 8.7–10.7)
eGFR: 29

## 2023-01-09 ENCOUNTER — Non-Acute Institutional Stay (SKILLED_NURSING_FACILITY): Payer: PPO | Admitting: Nurse Practitioner

## 2023-01-09 ENCOUNTER — Encounter: Payer: Self-pay | Admitting: Nurse Practitioner

## 2023-01-09 DIAGNOSIS — F0394 Unspecified dementia, unspecified severity, with anxiety: Secondary | ICD-10-CM | POA: Diagnosis not present

## 2023-01-09 DIAGNOSIS — F03918 Unspecified dementia, unspecified severity, with other behavioral disturbance: Secondary | ICD-10-CM

## 2023-01-09 DIAGNOSIS — L03031 Cellulitis of right toe: Secondary | ICD-10-CM

## 2023-01-09 DIAGNOSIS — E039 Hypothyroidism, unspecified: Secondary | ICD-10-CM | POA: Diagnosis not present

## 2023-01-09 DIAGNOSIS — I480 Paroxysmal atrial fibrillation: Secondary | ICD-10-CM

## 2023-01-09 NOTE — Assessment & Plan Note (Signed)
on Lexapro, stabilizing mood

## 2023-01-09 NOTE — Assessment & Plan Note (Signed)
off  Donepezil, Memantine, on Depakote, followed by Neurology, Hospice for supportive care, MMSE 2/30 06/07/22

## 2023-01-09 NOTE — Progress Notes (Signed)
Location:  [facility] Room Number: 110-A Place of Service:  SNF (31) Provider:  Masayo Fera Dicie Beam    Patient Care Team: Jobin Montelongo X, NP as PCP - General (Internal Medicine) Quintella Reichert, MD as PCP - Cardiology (Cardiology) Renford Dills, MD (Internal Medicine)  Extended Emergency Contact Information Primary Emergency Contact: [facility] Address: [address]          [place], [place] [address] Darden Amber of [place] Home Phone: [phone] Mobile Phone: [phone] Relation: Spouse Secondary Emergency Contact: Onley,[name] Mobile Phone: [phone] Relation: Son  Code Status:  DNR Goals of care: Advanced Directive information    01/09/2023   10:57 AM  Advanced Directives  Does Patient Have a Medical Advance Directive? Yes  Type of Estate agent of [place];Living will;Out of facility DNR (pink MOST or yellow form)  Does patient want to make changes to medical advance directive? No - Patient declined  Copy of Healthcare Power of Attorney in Chart? Yes - validated most recent copy scanned in chart (See row information)  Pre-existing out of facility DNR order (yellow form or pink MOST form) Pink MOST/Yellow Form most recent copy in chart - Physician notified to receive inpatient order     Chief Complaint  Patient presents with   Acute Visit    Skin irritation buttocks     HPI:  Pt is a 89 y.o. male seen today for an acute visit for persisted redness, tenderness, warmth, and swelling about the right great toe nail, little response to Bactroban oint.    redness to the right great toe, noted redness, warmth, tenderness at the base of the right great toe nail. Also noted thick yellow long toe nails.                Dysphagia III diet Senile dementia,  off  Donepezil, Memantine, on Depakote, followed by Neurology, Hospice for supportive care, MMSE 2/30 06/07/22 Depression, on Lexapro, stabilizing mood             Adult  Failure To Thrive, weight loss-stable, followed by dietary             Afib followed by Cardiology, heart rate is in control, off  Eliquis, Hgb 14.0 08/13/22             CAD, on Statin, prn NTG             CHFrEF, off Farxiga, EF 30-35%             CHB s/p PPM             T2DM, on diet, Hgb a1c 9.5 06/29/22             Hypothyroidism, taking Levothyroxine, TSH 2.82 06/13/22             Hyperlipidemia, takes Atorvastatin, LDL 60 06/13/22             HTN, off Valsartan             CKD chronic, Bun/creat 22/1.4 08/13/22 Past Medical History:  Diagnosis Date   Allergic rhinitis, cause unspecified    BPH (benign prostatic hyperplasia)    Carotid artery stenosis, asymptomatic    1-39% by dopplers 09/2016 followed by Dr. Darrick Penna   Cataracts, bilateral    Chronic kidney disease (CKD), stage III (moderate)    Complete heart block 01/23/04   s/p Medtronic PPM implanted by Dr Amil Amen   COPD (chronic obstructive pulmonary disease)  Diabetes mellitus    Glaucoma    Hyperlipidemia    Hypertension    Insomnia due to medical condition 08/10/2014   Kidney stones    PAF (paroxysmal atrial fibrillation) 10/09/2014   Noted on pacer check with 88 mode switches and longest episode >1 hour.  Now on Apixiban for CHADS2VASC score of 5   Past Surgical History:  Procedure Laterality Date   PACEMAKER INSERTION  2005, 04/16/12   MDT implanted by Dr Amil Amen with generator chnage (MDT Adapta L) by Dr Johney Frame 04/16/12   PERMANENT PACEMAKER GENERATOR CHANGE N/A 04/16/2012   Procedure: PERMANENT PACEMAKER GENERATOR CHANGE;  Surgeon: Hillis Range, MD;  Location: [facility] CATH LAB;  Service: Cardiovascular;  Laterality: N/A;   RIGHT/LEFT HEART CATH AND CORONARY ANGIOGRAPHY N/A 10/18/2020   Procedure: RIGHT/LEFT HEART CATH AND CORONARY ANGIOGRAPHY;  Surgeon: Lennette Bihari, MD;  Location: [facility] INVASIVE CV LAB;  Service: Cardiovascular;  Laterality: N/A;    Allergies  Allergen Reactions   Penicillins Rash and Other (See Comments)     Childhood allergy    Irbesartan     Per [facility]   Penicillin G Benzathine Rash    Outpatient Encounter Medications as of 01/09/2023  Medication Sig   acetaminophen (TYLENOL) 325 MG tablet Take 2 tablets (650 mg total) by mouth every 6 (six) hours as needed for mild pain or headache.   divalproex (DEPAKOTE SPRINKLE) 125 MG capsule Take 250 mg by mouth 3 (three) times daily.   Docusate Sodium (DOCU LIQUID PO) Take 10 mLs by mouth 2 (two) times daily.   escitalopram (LEXAPRO) 10 MG tablet Take 10 mg by mouth daily.   ipratropium-albuterol (DUONEB) 0.5-2.5 (3) MG/3ML SOLN Take 3 mLs by nebulization every 8 (eight) hours as needed.   levothyroxine (SYNTHROID) 75 MCG tablet Take 75 mcg by mouth daily before breakfast.   magnesium sulfate (EPSOM SALT) GRAN Apply to Right foot topically one time a day for nail infection soak R foot/great toe daily x 15 minutes until healed.   mupirocin ointment (BACTROBAN) 2 % Apply 1 Application topically daily. Apply to Right Great Toe  for nail infection daily until healed, apply to nail base   nitroGLYCERIN (NITROSTAT) 0.4 MG SL tablet Place 1 tablet (0.4 mg total) under the tongue every 5 (five) minutes as needed for chest pain.   senna (SENOKOT) 8.6 MG TABS tablet Take 2 tablets by mouth daily as needed for mild constipation. Give 2 tablet by mouth at bedtime for constipation   lactose free nutrition (BOOST) LIQD Take 237 mLs by mouth 2 (two) times daily between meals.   [DISCONTINUED] atorvastatin (LIPITOR) 80 MG tablet TAKE ONE TABLET BY MOUTH EVERY EVENING   [DISCONTINUED] haloperidol (HALDOL) 2 MG tablet Take 2 mg by mouth as needed for agitation (Or anxiety). *Give either the tablet or IM. Can not be given together.*   [DISCONTINUED] haloperidol lactate (HALDOL) 5 MG/ML injection Inject 2 mg into the muscle as needed (For agitation or anxiety). *Give either the tablet or IM. Can not be given together.*   [DISCONTINUED] LORazepam (ATIVAN) 2 MG/ML injection Inject  2 mg into the muscle every 8 (eight) hours as needed for anxiety (Agitation). Give either the gel or IM. Cannot be given together.   No facility-administered encounter medications on file as of 01/09/2023.    Review of Systems  Unable to perform ROS: Dementia    Immunization History  Administered Date(s) Administered   Influenza-Unspecified 07/04/2014, 08/05/2022   Moderna Covid-19 Vaccine Bivalent Booster 25yrs &  up 07/18/2022   Moderna SARS-COV2 Booster Vaccination 07/25/2020, 02/13/2021   Moderna Sars-Covid-2 Vaccination 09/20/2019, 10/18/2019   PNEUMOCOCCAL CONJUGATE-20 08/07/2022   Pfizer Covid-19 Vaccine Bivalent Booster 5yrs & up 06/20/2021   Respiratory Syncytial Virus Vaccine,Recomb Aduvanted(Arexvy) 10/04/2022   Tdap 10/28/2022   Zoster Recombinat (Shingrix) 11/06/2017, 03/28/2018   Pertinent  Health Maintenance Due  Topic Date Due   OPHTHALMOLOGY EXAM  Never done   HEMOGLOBIN A1C  12/29/2022   INFLUENZA VACCINE  04/17/2023   FOOT EXAM  10/19/2023      07/05/2022    4:50 PM 10/18/2022   11:21 AM 11/04/2022    2:33 PM 11/15/2022    8:54 AM 12/26/2022   11:29 AM  Fall Risk  Falls in the past year? 0 0 0 0 0  Was there an injury with Fall? 0 0 0 0 0  Fall Risk Category Calculator 0 0 0 0 0  Fall Risk Category (Retired) Low      (RETIRED) Patient Fall Risk Level High fall risk      Patient at Risk for Falls Due to History of fall(s) No Fall Risks  No Fall Risks No Fall Risks  Fall risk Follow up Falls evaluation completed Falls evaluation completed Falls evaluation completed Falls evaluation completed Falls evaluation completed   Functional Status Survey:    Vitals:   01/09/23 1044  BP: (!) 158/77  Pulse: 69  Resp: 18  Temp: 97.6 F (36.4 C)  SpO2: 98%  Weight: 148 lb 8 oz (67.4 kg)  Height:  (1.676 m)   Body mass index is 23.97 kg/m. Physical Exam Vitals and nursing note reviewed.  Constitutional:      Appearance: Normal appearance.  HENT:      Head: Normocephalic and atraumatic.     Nose: Nose normal.     Mouth/Throat:     Mouth: Mucous membranes are moist.  Eyes:     Extraocular Movements: Extraocular movements intact.     Conjunctiva/sclera: Conjunctivae normal.     Pupils: Pupils are equal, round, and reactive to light.  Cardiovascular:     Rate and Rhythm: Normal rate and regular rhythm.     Heart sounds: No murmur heard. Pulmonary:     Effort: Pulmonary effort is normal.     Breath sounds: No rales.  Abdominal:     General: Bowel sounds are normal.     Palpations: Abdomen is soft.     Tenderness: There is no abdominal tenderness.  Genitourinary:    Comments: External hemorrhoids from previous examination.  Musculoskeletal:     Cervical back: Normal range of motion and neck supple.     Right lower leg: No edema.     Left lower leg: No edema.  Skin:    General: Skin is warm and dry.     Findings: Erythema present.     Comments: Redness, warmth, tenderness, swelling the right great toe, especially around the toe nail  Neurological:     General: No focal deficit present.     Mental Status: He is alert. Mental status is at baseline.     Gait: Gait abnormal.     Comments: Oriented to person  Psychiatric:     Comments: Flat affect, non cooperative.      Labs reviewed: Recent Labs    06/29/22 2006 07/01/22 0238 07/02/22 0242 08/13/22 0000  NA 143 140 142 144  K 4.5 3.9 3.6 3.8  CL 111 108 108 109*  CO2 21* 21* 25 27*  GLUCOSE 209* 199* 112*  --   BUN 22 34* 27* 22*  CREATININE 1.67* 1.76* 1.65* 1.4*  CALCIUM 8.8* 8.5* 8.8* 8.9   Recent Labs    06/29/22 1545 08/13/22 0000  AST 25 15  ALT 29 19  ALKPHOS 77 80  BILITOT 0.5  --   PROT 6.2*  --   ALBUMIN 3.6 4.0   Recent Labs    06/29/22 1545 06/29/22 1605 07/01/22 0238 07/02/22 0242 08/13/22 0000  WBC 6.4  --  6.4 4.8 5.5  NEUTROABS 4.8  --   --   --  2,690.00  HGB 14.4   < > 14.3 14.9 14.0  HCT 44.5   < > 43.3 45.7 44  MCV 82.9  --   81.5 81.5  --   PLT 222  --  213 229 246   < > = values in this interval not displayed.   No results found for: "TSH" Lab Results  Component Value Date   HGBA1C 9.5 (H) 06/29/2022   Lab Results  Component Value Date   CHOL 128 12/16/2016   HDL 55 12/16/2016   LDLCALC 58 12/16/2016   TRIG 97 06/29/2022   CHOLHDL 2.3 12/16/2016    Significant Diagnostic Results in last 30 days:  No results found.  Assessment/Plan Paronychia of great toe of right foot persisted redness, tenderness, warmth, and swelling about the right great toe nail, little response to Bactroban oint. Pending Podiatry eval/tx, will start Doxycycline 100mg  bid x 10 days, observe.   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   Labs/tests ordered:  none  Time spend 35 minutes.

## 2023-01-09 NOTE — Assessment & Plan Note (Signed)
heart rate is in control, off  Eliquis, Hgb 14.0 08/13/22 

## 2023-01-09 NOTE — Assessment & Plan Note (Signed)
taking Levothyroxine, TSH 2.82 06/13/22 

## 2023-01-09 NOTE — Assessment & Plan Note (Signed)
persisted redness, tenderness, warmth, and swelling about the right great toe nail, little response to Bactroban oint. Pending Podiatry eval/tx, will start Doxycycline  bid x 10 days, observe.

## 2023-01-14 ENCOUNTER — Non-Acute Institutional Stay (SKILLED_NURSING_FACILITY): Admitting: Nurse Practitioner

## 2023-01-14 ENCOUNTER — Encounter: Payer: Self-pay | Admitting: Nurse Practitioner

## 2023-01-14 DIAGNOSIS — F0394 Unspecified dementia, unspecified severity, with anxiety: Secondary | ICD-10-CM | POA: Diagnosis not present

## 2023-01-14 DIAGNOSIS — E1121 Type 2 diabetes mellitus with diabetic nephropathy: Secondary | ICD-10-CM

## 2023-01-14 DIAGNOSIS — I10 Essential (primary) hypertension: Secondary | ICD-10-CM | POA: Diagnosis not present

## 2023-01-14 DIAGNOSIS — E7800 Pure hypercholesterolemia, unspecified: Secondary | ICD-10-CM

## 2023-01-14 DIAGNOSIS — I480 Paroxysmal atrial fibrillation: Secondary | ICD-10-CM | POA: Diagnosis not present

## 2023-01-14 DIAGNOSIS — R627 Adult failure to thrive: Secondary | ICD-10-CM

## 2023-01-14 DIAGNOSIS — I442 Atrioventricular block, complete: Secondary | ICD-10-CM

## 2023-01-14 DIAGNOSIS — F03918 Unspecified dementia, unspecified severity, with other behavioral disturbance: Secondary | ICD-10-CM

## 2023-01-14 DIAGNOSIS — E039 Hypothyroidism, unspecified: Secondary | ICD-10-CM

## 2023-01-14 DIAGNOSIS — E0849 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: Secondary | ICD-10-CM

## 2023-01-14 NOTE — Assessment & Plan Note (Signed)
weight loss-stable, followed by dietary

## 2023-01-14 NOTE — Assessment & Plan Note (Signed)
on diet, off FArxiga, Hgb a1c 9.5 06/29/22, repeat Hgb a1c

## 2023-01-14 NOTE — Assessment & Plan Note (Signed)
CAD, on Statin, prn NTG             CHFrEF, off Farxiga, EF 30-35%             CHB s/p PPM

## 2023-01-14 NOTE — Assessment & Plan Note (Signed)
off  Donepezil, Memantine, on Depakote, followed by Neurology, Hospice for supportive care, MMSE 2/30 06/07/22 

## 2023-01-14 NOTE — Assessment & Plan Note (Signed)
Blood pressure is controlled, off Valsartan 

## 2023-01-14 NOTE — Assessment & Plan Note (Signed)
takes Atorvastatin, LDL 60 06/13/22 

## 2023-01-14 NOTE — Assessment & Plan Note (Signed)
heart rate is in control, off  Eliquis, Hgb 11.7 01/04/23

## 2023-01-14 NOTE — Progress Notes (Addendum)
Location:  [facility] Room Number: 110-A Place of Service:  SNF (31) Provider:  Dream Nodal Dicie Beam    Patient Care Team: Blaze Sandin X, NP as PCP - General (Internal Medicine) Quintella Reichert, MD as PCP - Cardiology (Cardiology) Renford Dills, MD (Internal Medicine)  Extended Emergency Contact Information Primary Emergency Contact: Willetts,[name] Address: [address]          [place], [place] [address] Darden Amber of [place] Home Phone: [phone] Mobile Phone: [phone] Relation: Spouse Secondary Emergency Contact: Luevanos,[name] Mobile Phone: [phone] Relation: Son  Code Status:  DNR Goals of care: Advanced Directive information    01/31/2023    9:23 AM  Advanced Directives  Does Patient Have a Medical Advance Directive? Yes  Type of Estate agent of [place];Living will;Out of facility DNR (pink MOST or yellow form)  Does patient want to make changes to medical advance directive? No - Patient declined  Copy of Healthcare Power of Attorney in Chart? Yes - validated most recent copy scanned in chart (See row information)  Pre-existing out of facility DNR order (yellow form or pink MOST form) Pink MOST/Yellow Form most recent copy in chart - Physician notified to receive inpatient order     Chief Complaint  Patient presents with   Routine    HPI:  Pt is a 89 y.o. male seen today for medical management of chronic diseases.     Dysphagia III diet Senile dementia,  off  Donepezil, Memantine, on Depakote, followed by Neurology, Hospice for supportive care, MMSE 2/30 06/07/22 Depression, on Lexapro, stabilizing mood             Adult Failure To Thrive, weight loss-stable, followed by dietary             Afib followed by Cardiology, heart rate is in control, off  Eliquis, Hgb 11.7 01/04/23             CAD, on Statin, prn NTG             CHFrEF, off Farxiga, EF 30-35%             CHB s/p PPM             T2DM, on diet, off  FArxiga, Hgb a1c 9.5 06/29/22             Hypothyroidism, taking Levothyroxine, TSH 2.82 06/13/22             Hyperlipidemia, takes Atorvastatin, LDL 60 06/13/22             HTN, off Valsartan             CKD chronic, Bun/creat 48/1.7 01/04/23   Past Medical History:  Diagnosis Date   Allergic rhinitis, cause unspecified    BPH (benign prostatic hyperplasia)    Carotid artery stenosis, asymptomatic    1-39% by dopplers 09/2016 followed by Dr. Darrick Penna   Cataracts, bilateral    Chronic kidney disease (CKD), stage III (moderate) (HCC)    Complete heart block (HCC) 01/23/04   s/p Medtronic PPM implanted by Dr Amil Amen   COPD (chronic obstructive pulmonary disease) (HCC)    Diabetes mellitus    Glaucoma    Hyperlipidemia    Hypertension    Insomnia due to medical condition 08/10/2014   Kidney stones    PAF (paroxysmal atrial fibrillation) (HCC) 10/09/2014   Noted on pacer check with 88 mode switches and longest episode >1 hour.  Now on Apixiban for  CHADS2VASC score of 5   Past Surgical History:  Procedure Laterality Date   PACEMAKER INSERTION  2005, 04/16/12   MDT implanted by Dr Amil Amen with generator chnage (MDT Adapta L) by Dr Johney Frame 04/16/12   PERMANENT PACEMAKER GENERATOR CHANGE N/A 04/16/2012   Procedure: PERMANENT PACEMAKER GENERATOR CHANGE;  Surgeon: Hillis Range, MD;  Location: [facility] CATH LAB;  Service: Cardiovascular;  Laterality: N/A;   RIGHT/LEFT HEART CATH AND CORONARY ANGIOGRAPHY N/A 10/18/2020   Procedure: RIGHT/LEFT HEART CATH AND CORONARY ANGIOGRAPHY;  Surgeon: Lennette Bihari, MD;  Location: [facility] INVASIVE CV LAB;  Service: Cardiovascular;  Laterality: N/A;    Allergies  Allergen Reactions   Penicillins Rash and Other (See Comments)    Childhood allergy    Irbesartan     Per [facility]   Penicillin G Benzathine Rash    Outpatient Encounter Medications as of 01/14/2023  Medication Sig   acetaminophen (TYLENOL) 325 MG tablet Take 2 tablets (650 mg total) by mouth every 6 (six) hours as  needed for mild pain or headache.   cyanocobalamin (VITAMIN B12) 1000 MCG tablet Take 1,000 mcg by mouth daily. (Patient not taking: Reported on 01/31/2023)   divalproex (DEPAKOTE SPRINKLE) 125 MG capsule Take 250 mg by mouth 3 (three) times daily.   Docusate Sodium (DOCU LIQUID PO) Take 10 mLs by mouth 2 (two) times daily.   dorzolamide (TRUSOPT) 2 % ophthalmic solution Place 1 drop into the left eye 3 (three) times daily.   doxycycline (ADOXA) 100 MG tablet Take 100 mg by mouth 2 (two) times daily. (Patient not taking: Reported on 01/31/2023)   ezetimibe (ZETIA) 10 MG tablet Take 10 mg by mouth daily.   ipratropium-albuterol (DUONEB) 0.5-2.5 (3) MG/3ML SOLN Take 3 mLs by nebulization every 8 (eight) hours as needed.   levothyroxine (SYNTHROID) 75 MCG tablet Take 75 mcg by mouth daily before breakfast.   magnesium sulfate (EPSOM SALT) GRAN Apply to Right foot topically one time a day for nail infection soak R foot/great toe daily x 15 minutes until healed.   nitroGLYCERIN (NITROSTAT) 0.4 MG SL tablet Place 1 tablet (0.4 mg total) under the tongue every 5 (five) minutes as needed for chest pain.   polyethylene glycol (MIRALAX) 17 g packet Take 17 g by mouth daily. Administer in 4-6oz of water (Patient not taking: Reported on 01/31/2023)   senna (SENOKOT) 8.6 MG TABS tablet Take 2 tablets by mouth daily as needed for mild constipation. Give 2 tablet by mouth at bedtime for constipation   lactose free nutrition (BOOST) LIQD Take 237 mLs by mouth 2 (two) times daily between meals.   mupirocin ointment (BACTROBAN) 2 % Apply 1 Application topically daily. Apply to Right Great Toe  for nail infection daily until healed, apply to nail base   [DISCONTINUED] escitalopram (LEXAPRO) 10 MG tablet Take 10 mg by mouth daily.   [DISCONTINUED] loratadine (CLARITIN) 10 MG tablet Take 10 mg by mouth daily.   [DISCONTINUED] LORazepam (ATIVAN) 1 MG tablet Take 1 mg by mouth. in the morning every Mon, Thu   [DISCONTINUED]  memantine (NAMENDA) 10 MG tablet Take 10 mg by mouth 2 (two) times daily.   [DISCONTINUED] Multiple Vitamin (MULTIVITAMIN ADULT PO) Take by mouth. Give 1 tablet by mouth one time a day for Vitamin deficiency   [DISCONTINUED] pantoprazole (PROTONIX) 40 MG tablet Take 40 mg by mouth daily.   [DISCONTINUED] simethicone (MYLICON) 125 MG chewable tablet Chew 125 mg by mouth with breakfast, with lunch, and with evening meal.  No facility-administered encounter medications on file as of 01/14/2023.    Review of Systems  Unable to perform ROS: Dementia    Immunization History  Administered Date(s) Administered   Influenza-Unspecified 07/04/2014, 08/05/2022   Moderna Covid-19 Vaccine Bivalent Booster 23yrs & up 07/18/2022   Moderna SARS-COV2 Booster Vaccination 07/25/2020, 02/13/2021   Moderna Sars-Covid-2 Vaccination 09/20/2019, 10/18/2019   PNEUMOCOCCAL CONJUGATE-20 08/07/2022   Pfizer Covid-19 Vaccine Bivalent Booster 63yrs & up 06/20/2021   Respiratory Syncytial Virus Vaccine,Recomb Aduvanted(Arexvy) 10/04/2022   Tdap 10/28/2022   Zoster Recombinat (Shingrix) 11/06/2017, 03/28/2018   Pertinent  Health Maintenance Due  Topic Date Due   OPHTHALMOLOGY EXAM  Never done   HEMOGLOBIN A1C  12/29/2022   INFLUENZA VACCINE  04/17/2023   FOOT EXAM  10/19/2023      07/05/2022    4:50 PM 10/18/2022   11:21 AM 11/04/2022    2:33 PM 11/15/2022    8:54 AM 12/26/2022   11:29 AM  Fall Risk  Falls in the past year? 0 0 0 0 0  Was there an injury with Fall? 0 0 0 0 0  Fall Risk Category Calculator 0 0 0 0 0  Fall Risk Category (Retired) Low      (RETIRED) Patient Fall Risk Level High fall risk      Patient at Risk for Falls Due to History of fall(s) No Fall Risks  No Fall Risks No Fall Risks  Fall risk Follow up Falls evaluation completed Falls evaluation completed Falls evaluation completed Falls evaluation completed Falls evaluation completed   Functional Status Survey:    Vitals:   01/14/23  1419 01/14/23 1615  BP: (!) 160/69 138/86  Pulse: 69   Resp: 18   Temp: 97.8 F (36.6 C)   SpO2: 98%   Weight: 148 lb 8 oz (67.4 kg)   Height: 5\' 6"  (1.676 m)    Body mass index is 23.97 kg/m. Physical Exam Vitals and nursing note reviewed.  Constitutional:      Appearance: Normal appearance.  HENT:     Head: Normocephalic and atraumatic.     Nose: Nose normal.     Mouth/Throat:     Mouth: Mucous membranes are moist.  Eyes:     Extraocular Movements: Extraocular movements intact.     Conjunctiva/sclera: Conjunctivae normal.     Pupils: Pupils are equal, round, and reactive to light.  Cardiovascular:     Rate and Rhythm: Normal rate and regular rhythm.     Heart sounds: No murmur heard. Pulmonary:     Effort: Pulmonary effort is normal.     Breath sounds: No rales.  Abdominal:     General: Bowel sounds are normal.     Palpations: Abdomen is soft.     Tenderness: There is no abdominal tenderness.  Genitourinary:    Comments: External hemorrhoids from previous examination.  Musculoskeletal:     Cervical back: Normal range of motion and neck supple.     Right lower leg: No edema.     Left lower leg: No edema.  Skin:    General: Skin is warm and dry.     Findings: Erythema present.     Comments: Redness, warmth, tenderness, swelling the right great toe, especially around the toe nail, better  Neurological:     General: No focal deficit present.     Mental Status: He is alert. Mental status is at baseline.     Gait: Gait abnormal.     Comments: Oriented to person  Psychiatric:     Comments: Flat affect, non cooperative.      Labs reviewed: Recent Labs    06/29/22 2006 07/01/22 0238 07/02/22 0242 08/13/22 0000 01/04/23 0000  NA 143 140 142 144 140  K 4.5 3.9 3.6 3.8 3.9  CL 111 108 108 109* 106  CO2 21* 21* 25 27* 21  GLUCOSE 209* 199* 112*  --   --   BUN 22 34* 27* 22* 48*  CREATININE 1.67* 1.76* 1.65* 1.4* 1.7*  CALCIUM 8.8* 8.5* 8.8* 8.9 8.9    Recent Labs    06/29/22 1545 08/13/22 0000  AST 25 15  ALT 29 19  ALKPHOS 77 80  BILITOT 0.5  --   PROT 6.2*  --   ALBUMIN 3.6 4.0   Recent Labs    06/29/22 1545 06/29/22 1605 07/01/22 0238 07/02/22 0242 08/13/22 0000 01/04/23 0000  WBC 6.4  --  6.4 4.8 5.5 7.0  NEUTROABS 4.8  --   --   --  2,690.00  --   HGB 14.4   < > 14.3 14.9 14.0 11.7*  HCT 44.5   < > 43.3 45.7 44 35*  MCV 82.9  --  81.5 81.5  --   --   PLT 222  --  213 229 246 168   < > = values in this interval not displayed.   No results found for: "TSH" Lab Results  Component Value Date   HGBA1C 9.5 (H) 06/29/2022   Lab Results  Component Value Date   CHOL 128 12/16/2016   HDL 55 12/16/2016   LDLCALC 58 12/16/2016   TRIG 97 06/29/2022   CHOLHDL 2.3 12/16/2016    Significant Diagnostic Results in last 30 days:  No results found.  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) on diet, off FArxiga, Hgb a1c 9.5 06/29/22, repeat Hgb a1c  Hypercholesterolemia without hypertriglyceridemia takes Atorvastatin, LDL 60 06/13/22  Hypertension Blood pressure is controlled,  off Valsartan  Senile dementia with behavioral disturbance (HCC)  off  Donepezil, Memantine, on Depakote, followed by Neurology, Hospice for supportive care, MMSE 2/30 06/07/22  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    Labs/tests ordered:  none  Time spend 35 minutes.

## 2023-01-14 NOTE — Assessment & Plan Note (Signed)
Bun/creat 48/1.7 01/04/23, on diet, Hgb a1c 9.5 06/29/22

## 2023-01-14 NOTE — Assessment & Plan Note (Signed)
taking Levothyroxine, TSH 2.82 06/13/22 

## 2023-01-14 NOTE — Assessment & Plan Note (Signed)
on Lexapro, stabilizing mood 

## 2023-01-14 NOTE — Progress Notes (Signed)
This encounter was created in error - please disregard.

## 2023-01-16 DIAGNOSIS — E1121 Type 2 diabetes mellitus with diabetic nephropathy: Secondary | ICD-10-CM | POA: Diagnosis not present

## 2023-01-17 LAB — HEMOGLOBIN A1C
Hemoglobin A1C: 7.7
Hemoglobin A1C: 7.7

## 2023-01-21 DIAGNOSIS — L602 Onychogryphosis: Secondary | ICD-10-CM | POA: Diagnosis not present

## 2023-01-21 DIAGNOSIS — E119 Type 2 diabetes mellitus without complications: Secondary | ICD-10-CM | POA: Diagnosis not present

## 2023-01-21 DIAGNOSIS — M79672 Pain in left foot: Secondary | ICD-10-CM | POA: Diagnosis not present

## 2023-01-21 DIAGNOSIS — M79671 Pain in right foot: Secondary | ICD-10-CM | POA: Diagnosis not present

## 2023-01-31 ENCOUNTER — Non-Acute Institutional Stay (SKILLED_NURSING_FACILITY): Payer: PPO | Admitting: Nurse Practitioner

## 2023-01-31 ENCOUNTER — Encounter: Payer: Self-pay | Admitting: Nurse Practitioner

## 2023-01-31 DIAGNOSIS — I480 Paroxysmal atrial fibrillation: Secondary | ICD-10-CM | POA: Diagnosis not present

## 2023-01-31 DIAGNOSIS — B351 Tinea unguium: Secondary | ICD-10-CM

## 2023-01-31 DIAGNOSIS — F0394 Unspecified dementia, unspecified severity, with anxiety: Secondary | ICD-10-CM

## 2023-01-31 DIAGNOSIS — E0849 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: Secondary | ICD-10-CM | POA: Diagnosis not present

## 2023-01-31 NOTE — Assessment & Plan Note (Signed)
off  Donepezil, Memantine, on Depakote, followed by Neurology, Hospice for supportive care, MMSE 2/30 06/07/22 on Lexapro, stabilizing mood

## 2023-01-31 NOTE — Progress Notes (Signed)
Location:  [facility] Room Number: 110A Place of Service:  SNF (31) Provider:  Ladonna Vanorder X, NP   Georgana Romain X, NP  Patient Care Team: Nester Bachus X, NP as PCP - General (Internal Medicine) Quintella Reichert, MD as PCP - Cardiology (Cardiology) Renford Dills, MD (Internal Medicine)  Extended Emergency Contact Information Primary Emergency Contact: Sprecher,[name] Address: [address]          [place], [place] [address] [place] of [place] Home Phone: [phone] Mobile Phone: [phone] Relation: Spouse Secondary Emergency Contact: Whyte,[name] Mobile Phone: [phone] Relation: Son  Code Status:  DNR  Goals of care: Advanced Directive information    01/31/2023    9:23 AM  Advanced Directives  Does Patient Have a Medical Advance Directive? Yes  Type of Estate agent of [place];Living will;Out of facility DNR (pink MOST or yellow form)  Does patient want to make changes to medical advance directive? No - Patient declined  Copy of Healthcare Power of Attorney in Chart? Yes - validated most recent copy scanned in chart (See row information)  Pre-existing out of facility DNR order (yellow form or pink MOST form) Pink MOST/Yellow Form most recent copy in chart - Physician notified to receive inpatient order     Chief Complaint  Patient presents with   Acute Visit    Patient is being seen for acute toe pain     HPI:  Pt is a 89 y.o. male seen today for an acute visit for yellow, thick right great toe, has been treated for paronychia, small open irritated area at the base, but the right toe redness, pain, swelling, warmth improved. Podiatrist evaluated.    Dysphagia III diet Senile dementia,  off  Donepezil, Memantine, on Depakote, followed by Neurology, Hospice for supportive care, MMSE 2/30 06/07/22 Depression, on Lexapro, stabilizing mood             Adult Failure To Thrive, weight loss-stable, followed by dietary              Afib followed by Cardiology, heart rate is in control, off  Eliquis, Hgb 11.7 01/04/23             CAD, on Statin, prn NTG             CHFrEF, off Farxiga, EF 30-35%             CHB s/p PPM             T2DM, on diet, off FArxiga, Hgb a1c 7.7 01/16/23             Hypothyroidism, taking Levothyroxine, TSH 2.82 06/13/22             Hyperlipidemia, takes Atorvastatin, LDL 60 06/13/22             HTN, off Valsartan             CKD chronic, Bun/creat 48/1.7 01/04/23   Past Medical History:  Diagnosis Date   Allergic rhinitis, cause unspecified    BPH (benign prostatic hyperplasia)    Carotid artery stenosis, asymptomatic    1-39% by dopplers 09/2016 followed by Dr. Darrick Penna   Cataracts, bilateral    Chronic kidney disease (CKD), stage III (moderate) (HCC)    Complete heart block (HCC) 01/23/04   s/p Medtronic PPM implanted by Dr Amil Amen   COPD (chronic obstructive pulmonary disease) (HCC)    Diabetes mellitus    Glaucoma    Hyperlipidemia  Hypertension    Insomnia due to medical condition 08/10/2014   Kidney stones    PAF (paroxysmal atrial fibrillation) (HCC) 10/09/2014   Noted on pacer check with 88 mode switches and longest episode >1 hour.  Now on Apixiban for CHADS2VASC score of 5   Past Surgical History:  Procedure Laterality Date   PACEMAKER INSERTION  2005, 04/16/12   MDT implanted by Dr Amil Amen with generator chnage (MDT Adapta L) by Dr Johney Frame 04/16/12   PERMANENT PACEMAKER GENERATOR CHANGE N/A 04/16/2012   Procedure: PERMANENT PACEMAKER GENERATOR CHANGE;  Surgeon: Hillis Range, MD;  Location[facility] C[facility] LAB;  Service: Cardiovascular;  Laterality: N/A;   RIGHT/LEFT HEART CATH AND CORONARY ANGIOGRAPHY N/A 10/18/2020   Procedure: RIGHT/LEFT HEART CATH AND CORONARY ANGIOGRAPHY;  Surgeon: Lennette Bihari, MD;  Location: [facility] INVASIVE CV LAB;  Service: Cardiovascular;  Laterality: N/A;    Allergies  Allergen Reactions   Penicillins Rash and Other (See Comments)    Childhood allergy     Irbesartan     Per [facility]   Penicillin G Benzathine Rash    Outpatient Encounter Medications as of 01/31/2023  Medication Sig   acetaminophen (TYLENOL) 325 MG tablet Take 2 tablets (650 mg total) by mouth every 6 (six) hours as needed for mild pain or headache.   divalproex (DEPAKOTE SPRINKLE) 125 MG capsule Take 250 mg by mouth 3 (three) times daily.   Docusate Sodium (DOCU LIQUID PO) Take 10 mLs by mouth 2 (two) times daily.   dorzolamide (TRUSOPT) 2 % ophthalmic solution Place 1 drop into the left eye 3 (three) times daily.   escitalopram (LEXAPRO) 10 MG tablet Take 10 mg by mouth daily.   ezetimibe (ZETIA) 10 MG tablet Take 10 mg by mouth daily.   ipratropium-albuterol (DUONEB) 0.5-2.5 (3) MG/3ML SOLN Take 3 mLs by nebulization every 8 (eight) hours as needed.   lactose free nutrition (BOOST) LIQD Take 237 mLs by mouth 2 (two) times daily between meals.   levothyroxine (SYNTHROID) 75 MCG tablet Take 75 mcg by mouth daily before breakfast.   LORazepam (ATIVAN) 1 MG tablet Take 1 mg by mouth 2 (two) times a week. Give 1 mg by mouth  one time a day every Monday and Thursday.   magnesium sulfate (EPSOM SALT) GRAN Apply to Right foot topically one time a day for nail infection soak R foot/great toe daily x 15 minutes until healed.   mupirocin ointment (BACTROBAN) 2 % Apply 1 Application topically daily. Apply to Right Great Toe  for nail infection daily until healed, apply to nail base   nitroGLYCERIN (NITROSTAT) 0.4 MG SL tablet Place 1 tablet (0.4 mg total) under the tongue every 5 (five) minutes as needed for chest pain.   senna (SENOKOT) 8.6 MG TABS tablet Take 2 tablets by mouth daily as needed for mild constipation. Give 2 tablet by mouth at bedtime for constipation   cyanocobalamin (VITAMIN B12) 1000 MCG tablet Take 1,000 mcg by mouth daily. (Patient not taking: Reported on 01/31/2023)   doxycycline (ADOXA) 100 MG tablet Take 100 mg by mouth 2 (two) times daily. (Patient not taking: Reported  on 01/31/2023)   polyethylene glycol (MIRALAX) 17 g packet Take 17 g by mouth daily. Administer in 4-6oz of water (Patient not taking: Reported on 01/31/2023)   No facility-administered encounter medications on file as of 01/31/2023.    Review of Systems  Unable to perform ROS: Dementia    Immunization History  Administered Date(s) Administered   Influenza-Unspecified 07/04/2014, 08/05/2022  Moderna Covid-19 Vaccine Bivalent Booster 9yrs & up 07/18/2022   Moderna SARS-COV2 Booster Vaccination 07/25/2020, 02/13/2021   Moderna Sars-Covid-2 Vaccination 09/20/2019, 10/18/2019   PNEUMOCOCCAL CONJUGATE-20 08/07/2022   Pfizer Covid-19 Vaccine Bivalent Booster 33yrs & up 06/20/2021   Respiratory Syncytial Virus Vaccine,Recomb Aduvanted(Arexvy) 10/04/2022   Tdap 10/28/2022   Zoster Recombinat (Shingrix) 11/06/2017, 03/28/2018   Pertinent  Health Maintenance Due  Topic Date Due   OPHTHALMOLOGY EXAM  Never done   HEMOGLOBIN A1C  12/29/2022   INFLUENZA VACCINE  04/17/2023   FOOT EXAM  10/19/2023      07/05/2022    4:50 PM 10/18/2022   11:21 AM 11/04/2022    2:33 PM 11/15/2022    8:54 AM 12/26/2022   11:29 AM  Fall Risk  Falls in the past year? 0 0 0 0 0  Was there an injury with Fall? 0 0 0 0 0  Fall Risk Category Calculator 0 0 0 0 0  Fall Risk Category (Retired) Low      (RETIRED) Patient Fall Risk Level High fall risk      Patient at Risk for Falls Due to History of fall(s) No Fall Risks  No Fall Risks No Fall Risks  Fall risk Follow up Falls evaluation completed Falls evaluation completed Falls evaluation completed Falls evaluation completed Falls evaluation completed   Functional Status Survey:    Vitals:   01/31/23 0914  BP: 133/75  Pulse: 77  Resp: 18  Temp: 98.3 F (36.8 C)  TempSrc: Temporal  SpO2: 95%  Weight: 145 lb 3.2 oz (65.9 kg)  Height: 5\' 6"  (1.676 m)   Body mass index is 23.44 kg/m. Physical Exam Vitals and nursing note reviewed.  Constitutional:       Appearance: Normal appearance.  HENT:     Head: Normocephalic and atraumatic.     Nose: Nose normal.     Mouth/Throat:     Mouth: Mucous membranes are moist.  Eyes:     Extraocular Movements: Extraocular movements intact.     Conjunctiva/sclera: Conjunctivae normal.     Pupils: Pupils are equal, round, and reactive to light.  Cardiovascular:     Rate and Rhythm: Normal rate and regular rhythm.     Heart sounds: No murmur heard. Pulmonary:     Effort: Pulmonary effort is normal.     Breath sounds: No rales.  Abdominal:     General: Bowel sounds are normal.     Palpations: Abdomen is soft.     Tenderness: There is no abdominal tenderness.  Genitourinary:    Comments: External hemorrhoids from previous examination.  Musculoskeletal:     Cervical back: Normal range of motion and neck supple.     Right lower leg: No edema.     Left lower leg: No edema.  Skin:    General: Skin is warm and dry.     Findings: Erythema present.     Comments:  yellow, thick right great toe, has been treated for paronychia, small open irritated area at the base, but the right toe redness, pain, swelling, warmth improved  Neurological:     General: No focal deficit present.     Mental Status: He is alert. Mental status is at baseline.     Gait: Gait abnormal.     Comments: Oriented to person, furniture walking.   Psychiatric:     Comments: Flat affect, non cooperative.      Labs reviewed: Recent Labs    06/29/22 2006 07/01/22 1610 07/02/22 0242  08/13/22 0000 01/04/23 0000  NA 143 140 142 144 140  K 4.5 3.9 3.6 3.8 3.9  CL 111 108 108 109* 106  CO2 21* 21* 25 27* 21  GLUCOSE 209* 199* 112*  --   --   BUN 22 34* 27* 22* 48*  CREATININE 1.67* 1.76* 1.65* 1.4* 1.7*  CALCIUM 8.8* 8.5* 8.8* 8.9 8.9   Recent Labs    06/29/22 1545 08/13/22 0000  AST 25 15  ALT 29 19  ALKPHOS 77 80  BILITOT 0.5  --   PROT 6.2*  --   ALBUMIN 3.6 4.0   Recent Labs    06/29/22 1545 06/29/22 1605  07/01/22 0238 07/02/22 0242 08/13/22 0000 01/04/23 0000  WBC 6.4  --  6.4 4.8 5.5 7.0  NEUTROABS 4.8  --   --   --  2,690.00  --   HGB 14.4   < > 14.3 14.9 14.0 11.7*  HCT 44.5   < > 43.3 45.7 44 35*  MCV 82.9  --  81.5 81.5  --   --   PLT 222  --  213 229 246 168   < > = values in this interval not displayed.   No results found for: "TSH" Lab Results  Component Value Date   HGBA1C 9.5 (H) 06/29/2022   Lab Results  Component Value Date   CHOL 128 12/16/2016   HDL 55 12/16/2016   LDLCALC 58 12/16/2016   TRIG 97 06/29/2022   CHOLHDL 2.3 12/16/2016    Significant Diagnostic Results in last 30 days:  No results found.  Assessment/Plan Onychomycosis of great toe  yellow, thick right great toe, has been treated for paronychia, small open irritated area at the base, but the right toe redness, pain, swelling, warmth improved. Podiatrist evaluated.  Apply Clotrimazole topical cream bid the R great toe nail x 4 weeks.   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   Labs/tests ordered:  none  Time spend 35 minutes.

## 2023-01-31 NOTE — Assessment & Plan Note (Signed)
followed by Cardiology, heart rate is in control, off  Eliquis, Hgb 11.7 01/04/23

## 2023-01-31 NOTE — Assessment & Plan Note (Signed)
yellow, thick right great toe, has been treated for paronychia, small open irritated area at the base, but the right toe redness, pain, swelling, warmth improved. Podiatrist evaluated.  Apply Clotrimazole topical cream bid the R great toe nail x 4 weeks.

## 2023-01-31 NOTE — Assessment & Plan Note (Signed)
on diet, off FArxiga, Hgb a1c 7.7 01/16/23

## 2023-02-04 IMAGING — DX DG FOREARM 2V*L*
2 series · 2 of 2 positions shown · non-contrast
Comparison: None.

CLINICAL DATA: Fall, level 2 trauma

EXAM:
LEFT FOREARM - 2 VIEW

[forearm ap]
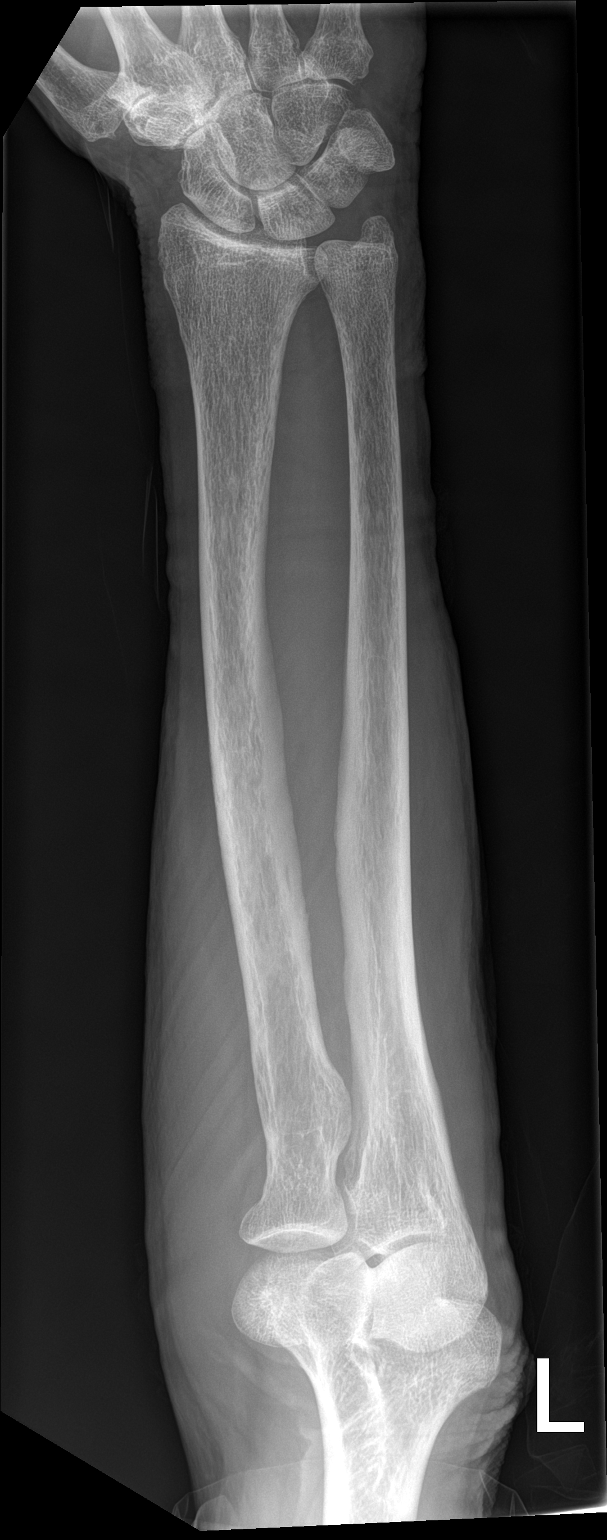

[forearm lat]
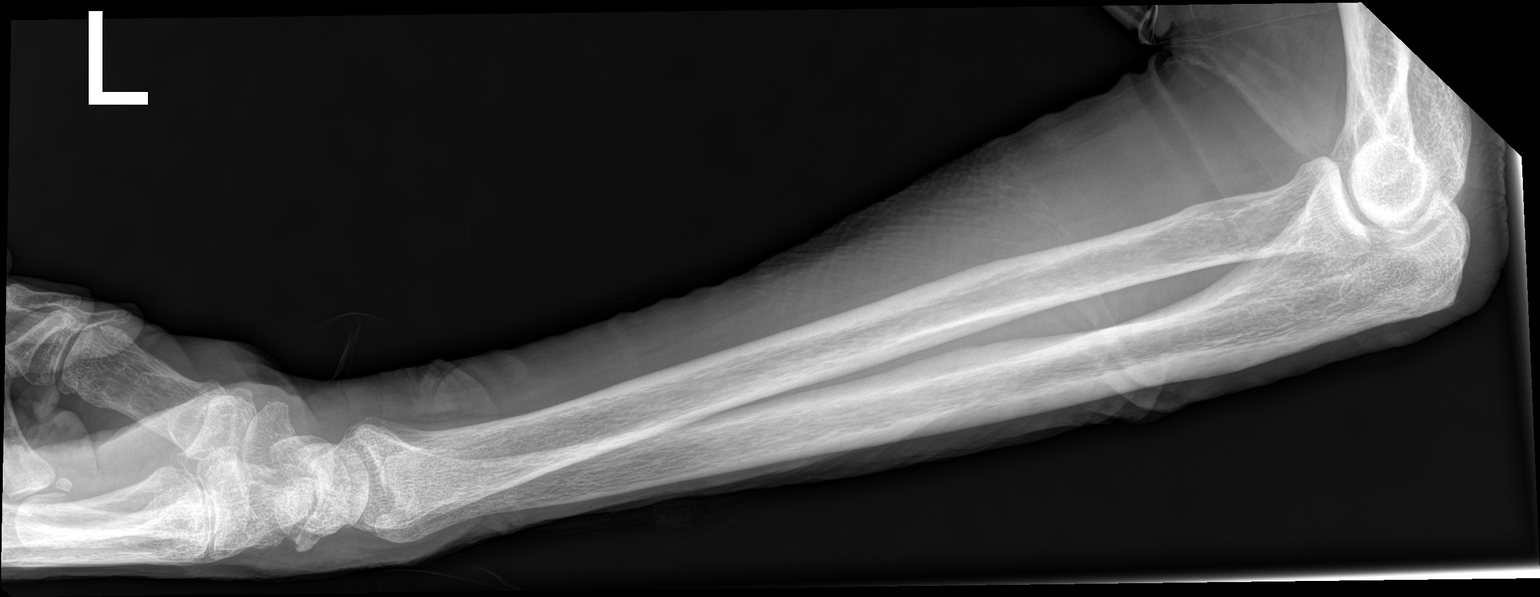

[2 of 2 positions shown; findings below may reference images not displayed]

FINDINGS: No fracture or dislocation is seen.

The joint spaces are preserved.

Visualized soft tissues are within normal limits.
IMPRESSION: Negative.

## 2023-02-11 ENCOUNTER — Encounter: Payer: Self-pay | Admitting: Nurse Practitioner

## 2023-02-11 ENCOUNTER — Non-Acute Institutional Stay (SKILLED_NURSING_FACILITY): Payer: PPO | Admitting: Nurse Practitioner

## 2023-02-11 DIAGNOSIS — F0394 Unspecified dementia, unspecified severity, with anxiety: Secondary | ICD-10-CM | POA: Diagnosis not present

## 2023-02-11 DIAGNOSIS — E039 Hypothyroidism, unspecified: Secondary | ICD-10-CM

## 2023-02-11 DIAGNOSIS — E7800 Pure hypercholesterolemia, unspecified: Secondary | ICD-10-CM

## 2023-02-11 DIAGNOSIS — R627 Adult failure to thrive: Secondary | ICD-10-CM | POA: Diagnosis not present

## 2023-02-11 DIAGNOSIS — E0849 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: Secondary | ICD-10-CM

## 2023-02-11 DIAGNOSIS — B351 Tinea unguium: Secondary | ICD-10-CM

## 2023-02-11 DIAGNOSIS — F03918 Unspecified dementia, unspecified severity, with other behavioral disturbance: Secondary | ICD-10-CM | POA: Diagnosis not present

## 2023-02-11 DIAGNOSIS — I480 Paroxysmal atrial fibrillation: Secondary | ICD-10-CM

## 2023-02-11 DIAGNOSIS — I5020 Unspecified systolic (congestive) heart failure: Secondary | ICD-10-CM

## 2023-02-11 DIAGNOSIS — I442 Atrioventricular block, complete: Secondary | ICD-10-CM

## 2023-02-11 NOTE — Assessment & Plan Note (Signed)
takes Atorvastatin, LDL 60 06/13/22 

## 2023-02-11 NOTE — Progress Notes (Signed)
Location:   [facility] Room Number: 110A Place of Service:  SNF (31) Provider:  Guy Seese X, NP  Page Pucciarelli X, NP  Patient Care Team: Jalee Saine X, NP as PCP - General (Internal Medicine) Quintella Reichert, MD as PCP - Cardiology (Cardiology) Renford Dills, MD (Internal Medicine)  Extended Emergency Contact Information Primary Emergency Contact: Tufo,[name] Address: [address]          [place], [place] [address] D[place] of [place] Home Phone: [phone] Mobile Phone: [phone] Relation: Spouse Secondary Emergency Contact: Maisel,[name] Mobile Phone: [phone] Relation: Son  Code Status:  DNR Goals of care: Advanced Directive information    02/11/2023    2:16 PM  Advanced Directives  Does Patient Have a Medical Advance Directive? Yes  Type of Estate agent of [place];Living will;Out of facility DNR (pink MOST or yellow form)  Does patient want to make changes to medical advance directive? No - Patient declined  Copy of Healthcare Power of Attorney in Chart? Yes - validated most recent copy scanned in chart (See row information)  Pre-existing out of facility DNR order (yellow form or pink MOST form) Pink MOST form placed in chart (order not valid for inpatient use);Yellow form placed in chart (order not valid for inpatient use)     Chief Complaint  Patient presents with  . Medical Management of Chronic Issues    Routine follow up   . Quality Metric Gaps    Eye exam and hemoglobin A1C due    HPI:  Pt is a 89 y.o. male seen today for medical management of chronic diseases.     The R great toe nail, yellow, thick right great toe, has been treated for paronychia, small open irritated area at the base, but the right toe redness, pain, swelling, warmth improved. Podiatrist evaluated.    Dysphagia III diet Senile dementia,  off  Donepezil, Memantine, on Depakote, followed by Neurology, Hospice for supportive care,  MMSE 2/30 06/07/22 Depression, on Lexapro, stabilizing mood             Adult Failure To Thrive, weight loss-stable, followed by dietary             Afib followed by Cardiology, heart rate is in control, off  Eliquis, Hgb 11.7 01/04/23             CAD, on Statin, prn NTG             CHFrEF, off Farxiga, EF 30-35%             CHB s/p PPM             T2DM, on diet, off FArxiga, Hgb a1c 7.7 01/16/23             Hypothyroidism, taking Levothyroxine, TSH 2.82 06/13/22             Hyperlipidemia, takes Atorvastatin, LDL 60 06/13/22             HTN, off Valsartan             CKD chronic, Bun/creat 48/1.7 01/04/23     Past Medical History:  Diagnosis Date  . Allergic rhinitis, cause unspecified   . BPH (benign prostatic hyperplasia)   . Carotid artery stenosis, asymptomatic    1-39% by dopplers 09/2016 followed by Dr. Darrick Penna  . Cataracts, bilateral   . Chronic kidney disease (CKD), stage III (moderate) (HCC)   . Complete heart block (HCC) 01/23/04  s/p Medtronic PPM implanted by Dr Amil Amen  . COPD (chronic obstructive pulmonary disease) (HCC)   . Diabetes mellitus   . Glaucoma   . Hyperlipidemia   . Hypertension   . 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 Kidney stones   . PAF (paroxysmal atrial fibrillation) (HCC) 10/09/2014   Noted on pacer check with 88 mode switches and longest episode >1 hour.  Now on Apixiban for CHADS2VASC score of 5   Past Surgical History:  Procedure Laterality Date  . PACEMAKER INSERTION  2005, 04/16/12   MDT implanted by Dr Amil Amen with generator chnage (MDT Adapta L) by Dr Johney Frame 04/16/12  . PERMANENT PACEMAKER GENERATOR CHANGE N/A 04/16/2012   Procedure: PERMANENT PACEMAKER GENERATOR CHANGE;  Surgeon: Hillis Range, MD;  Location: [facility] CATH LAB;  Service: Cardiovascular;  Laterality: N/A;  . RIGHT/LEFT HEART CATH AND CORONARY ANGIOGRAPHY N/A 10/18/2020   Procedure: RIGHT/LEFT HEART CATH AND CORONARY ANGIOGRAPHY;  Surgeon: Lennette Bihari, MD;  Location: [facility] INVASIVE  CV LAB;  Service: Cardiovascular;  Laterality: N/A;    Allergies  Allergen Reactions  . Penicillins Rash and Other (See Comments)    Childhood allergy   . Irbesartan     Per [facility]  . Penicillin G Benzathine Rash    Allergies as of 02/11/2023       Reactions   Penicillins Rash, Other (See Comments)   Childhood allergy    Irbesartan    Per [facility]   Penicillin G Benzathine Rash        Medication List        Accurate as of Feb 11, 2023  2:52 PM. If you have any questions, ask your nurse or doctor.          STOP taking these medications    cyanocobalamin 1000 MCG tablet Commonly known as: VITAMIN B12 Stopped by: Quitman Norberto X Laurielle Selmon, NP   dorzolamide 2 % ophthalmic solution Commonly known as: TRUSOPT Stopped by: Axle Parfait X Naydelin Ziegler, NP   doxycycline 100 MG tablet Commonly known as: ADOXA Stopped by: Madgeline Rayo X Sherena Machorro, NP   ezetimibe 10 MG tablet Commonly known as: ZETIA Stopped by: Sundeep Cary X Takeyla Million, NP   lactose free nutrition Liqd Stopped by: Rishab Stoudt X Kenechukwu Eckstein, NP   MiraLax 17 g packet Generic drug: polyethylene glycol Stopped by: Kilani Joffe X Dearis Danis, NP       TAKE these medications    acetaminophen 325 MG tablet Commonly known as: TYLENOL Take 2 tablets (650 mg total) by mouth every 6 (six) hours as needed for mild pain or headache.   clotrimazole 1 % cream Commonly known as: LOTRIMIN Apply 1 Application topically 2 (two) times daily. Apply to Right Great Toenail topically two times a day for toenail fungus   divalproex 125 MG capsule Commonly known as: DEPAKOTE SPRINKLE Take 250 mg by mouth 3 (three) times daily.   DOCU LIQUID PO Take 10 mLs by mouth 2 (two) times daily.   escitalopram 10 MG tablet Commonly known as: LEXAPRO Take 10 mg by mouth daily.   ipratropium-albuterol 0.5-2.5 (3) MG/3ML Soln Commonly known as: DUONEB Take 3 mLs by nebulization every 8 (eight) hours as needed.   levothyroxine 75 MCG tablet Commonly known as: SYNTHROID Take 75 mcg by mouth daily before  breakfast.   LORazepam 1 MG tablet Commonly known as: ATIVAN Take 1 mg by mouth 2 (two) times a week. Give 1 mg by mouth  one time a day every Monday and Thursday.   magnesium sulfate Oliver Barre Commonly  known as: EPSOM SALT Apply to Right foot topically one time a day for nail infection soak R foot/great toe daily x 15 minutes until healed.   mupirocin ointment 2 % Commonly known as: BACTROBAN Apply 1 Application topically daily. Apply to Right Great Toe  for nail infection daily until healed, apply to nail base   nitroGLYCERIN 0.4 MG SL tablet Commonly known as: NITROSTAT Place 1 tablet (0.4 mg total) under the tongue every 5 (five) minutes as needed for chest pain.   senna 8.6 MG Tabs tablet Commonly known as: SENOKOT Take 2 tablets by mouth daily as needed for mild constipation. Give 2 tablet by mouth at bedtime for constipation        Review of Systems  Unable to perform ROS: Dementia    Immunization History  Administered Date(s) Administered  . Influenza-Unspecified 07/04/2014, 08/05/2022  . Moderna Covid-19 Vaccine Bivalent Booster 66yrs & up 07/18/2022  . Moderna SARS-COV2 Booster Vaccination 07/25/2020, 02/13/2021  . Moderna Sars-Covid-2 Vaccination 09/20/2019, 10/18/2019  . PNEUMOCOCCAL CONJUGATE-20 08/07/2022  . [employer], [employer] 24yrs & up 06/20/2021  . Respiratory Syncytial Virus Vaccine,Recomb Aduvanted(Arexvy) 10/04/2022  . Tdap 10/28/2022  . Zoster Recombinat (Shingrix) 11/06/2017, 03/28/2018   Pertinent  Health Maintenance Due  Topic Date Due  . OPHTHALMOLOGY EXAM  Never done  . HEMOGLOBIN A1C  12/29/2022  . INFLUENZA VACCINE  04/17/2023  . FOOT EXAM  10/19/2023      07/05/2022    4:50 PM 10/18/2022   11:21 AM 11/04/2022    2:33 PM 11/15/2022    8:54 AM 12/26/2022   11:29 AM  Fall Risk  Falls in the past year? 0 0 0 0 0  Was there an injury with Fall? 0 0 0 0 0  Fall Risk Category Calculator 0 0 0 0 0  Fall Risk Category  (Retired) Low      (RETIRED) Patient Fall Risk Level High fall risk      Patient at Risk for Falls Due to History of fall(s) No Fall Risks  No Fall Risks No Fall Risks  Fall risk Follow up Falls evaluation completed Falls evaluation completed Falls evaluation completed Falls evaluation completed Falls evaluation completed   Functional Status Survey:    Vitals:   02/11/23 1412  BP: 120/74  Pulse: 70  Resp: 18  Temp: 98.3 F (36.8 C)  SpO2: 95%  Weight: 145 lb 3.2 oz (65.9 kg)  Height: 5\' 6"  (1.676 m)   Body mass index is 23.44 kg/m. Physical Exam Vitals and nursing note reviewed.  Constitutional:      Appearance: Normal appearance.  HENT:     Head: Normocephalic and atraumatic.     Nose: Nose normal.     Mouth/Throat:     Mouth: Mucous membranes are moist.  Eyes:     Extraocular Movements: Extraocular movements intact.     Conjunctiva/sclera: Conjunctivae normal.     Pupils: Pupils are equal, round, and reactive to light.  Cardiovascular:     Rate and Rhythm: Normal rate and regular rhythm.     Heart sounds: No murmur heard. Pulmonary:     Effort: Pulmonary effort is normal.     Breath sounds: No rales.  Abdominal:     General: Bowel sounds are normal.     Palpations: Abdomen is soft.     Tenderness: There is no abdominal tenderness.  Genitourinary:    Comments: External hemorrhoids from previous examination.  Musculoskeletal:     Cervical back: Normal  range of motion and neck supple.     Right lower leg: No edema.     Left lower leg: No edema.  Skin:    General: Skin is warm and dry.     Findings: Erythema present.     Comments:  yellow, thick right great toe, has been treated for paronychia, small open irritated area at the base, but the right toe redness, pain, swelling, warmth improved  Neurological:     General: No focal deficit present.     Mental Status: He is alert. Mental status is at baseline.     Gait: Gait abnormal.     Comments: Oriented to  person, furniture walking.   Psychiatric:     Comments: Flat affect, non cooperative.     Labs reviewed: Recent Labs    06/29/22 2006 07/01/22 0238 07/02/22 0242 08/13/22 0000 01/04/23 0000  NA 143 140 142 144 140  K 4.5 3.9 3.6 3.8 3.9  CL 111 108 108 109* 106  CO2 21* 21* 25 27* 21  GLUCOSE 209* 199* 112*  --   --   BUN 22 34* 27* 22* 48*  CREATININE 1.67* 1.76* 1.65* 1.4* 1.7*  CALCIUM 8.8* 8.5* 8.8* 8.9 8.9   Recent Labs    06/29/22 1545 08/13/22 0000  AST 25 15  ALT 29 19  ALKPHOS 77 80  BILITOT 0.5  --   PROT 6.2*  --   ALBUMIN 3.6 4.0   Recent Labs    06/29/22 1545 06/29/22 1605 07/01/22 0238 07/02/22 0242 08/13/22 0000 01/04/23 0000  WBC 6.4  --  6.4 4.8 5.5 7.0  NEUTROABS 4.8  --   --   --  2,690.00  --   HGB 14.4   < > 14.3 14.9 14.0 11.7*  HCT 44.5   < > 43.3 45.7 44 35*  MCV 82.9  --  81.5 81.5  --   --   PLT 222  --  213 229 246 168   < > = values in this interval not displayed.   No results found for: "TSH" Lab Results  Component Value Date   HGBA1C 9.5 (H) 06/29/2022   Lab Results  Component Value Date   CHOL 128 12/16/2016   HDL 55 12/16/2016   LDLCALC 58 12/16/2016   TRIG 97 06/29/2022   CHOLHDL 2.3 12/16/2016    Significant Diagnostic Results in last 30 days:  No results found.  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 Podiatrist  evaluated.   Atrioventricular block, complete (HCC) on Statin, prn NTG  HFrEF (heart failure with reduced ejection fraction) (HCC) off Farxiga, EF 30-35%, compensated clinically.   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   Labs/tests ordered:  none  Time spend 35 minutes.

## 2023-02-11 NOTE — Assessment & Plan Note (Signed)
on Statin, prn NTG

## 2023-02-11 NOTE — Assessment & Plan Note (Signed)
off Farxiga, EF 30-35%, compensated clinically 

## 2023-02-11 NOTE — Assessment & Plan Note (Signed)
Bun/creat 48/1.7 01/04/23

## 2023-02-11 NOTE — Assessment & Plan Note (Signed)
on diet, off FArxiga, Hgb a1c 7.7 01/16/23 

## 2023-02-11 NOTE — Assessment & Plan Note (Signed)
followed by Cardiology, heart rate is in control, off  Eliquis, Hgb 11.7 01/04/23 

## 2023-02-11 NOTE — Assessment & Plan Note (Signed)
taking Levothyroxine, TSH 2.82 06/13/22 

## 2023-02-11 NOTE — Assessment & Plan Note (Signed)
weight loss-stable, followed by dietary 

## 2023-02-11 NOTE — Assessment & Plan Note (Signed)
off  Donepezil, Memantine, on Depakote, followed by Neurology, Hospice for supportive care, MMSE 2/30 06/07/22 

## 2023-02-11 NOTE — Assessment & Plan Note (Signed)
The R great toe nail, yellow, thick right great toe, has been treated for paronychia, small open irritated area at the base, but the right toe redness, pain, swelling, warmth improved. Podiatrist evaluated.

## 2023-02-11 NOTE — Assessment & Plan Note (Signed)
on Lexapro, stabilizing mood 

## 2023-02-25 DIAGNOSIS — E119 Type 2 diabetes mellitus without complications: Secondary | ICD-10-CM | POA: Diagnosis not present

## 2023-02-25 DIAGNOSIS — L03031 Cellulitis of right toe: Secondary | ICD-10-CM | POA: Diagnosis not present

## 2023-03-18 ENCOUNTER — Non-Acute Institutional Stay (SKILLED_NURSING_FACILITY): Admitting: Family Medicine

## 2023-03-18 ENCOUNTER — Encounter: Payer: Self-pay | Admitting: Nurse Practitioner

## 2023-03-18 DIAGNOSIS — R627 Adult failure to thrive: Secondary | ICD-10-CM

## 2023-03-18 DIAGNOSIS — N1831 Chronic kidney disease, stage 3a: Secondary | ICD-10-CM | POA: Diagnosis not present

## 2023-03-18 DIAGNOSIS — F0394 Unspecified dementia, unspecified severity, with anxiety: Secondary | ICD-10-CM | POA: Diagnosis not present

## 2023-03-18 DIAGNOSIS — W19XXXA Unspecified fall, initial encounter: Secondary | ICD-10-CM | POA: Insufficient documentation

## 2023-03-18 NOTE — Progress Notes (Signed)
Provider:  Jacalyn Lefevre, MD Location:      Place of Service:     PCP: Mast, Man X, NP Patient Care Team: Mast, Man X, NP as PCP - General (Internal Medicine) Quintella Reichert, MD as PCP - Cardiology (Cardiology) Renford Dills, MD (Internal Medicine)  Extended Emergency Contact Information Primary Emergency Contact: [facility] Address: [address]          [place], [place] [address] [place] Home Phone: [phone] Mobile Phone: [phone] Relation: Spouse Secondary Emergency Contact: Guttierrez,[name] Mobile Phone: [phone] Relation: Son  Code Status:  Goals of Care: Advanced Directive information    02/11/2023    2:16 PM  Advanced Directives  Does Patient Have a Medical Advance Directive? Yes  Type of Estate agent of [place];Living will;Out of facility DNR (pink MOST or yellow form)  Does patient want to make changes to medical advance directive? No - Patient declined  Copy of Healthcare Power of Attorney in Chart? Yes - validated most recent copy scanned in chart (See row information)  Pre-existing out of facility DNR order (yellow form or pink MOST form) Pink MOST form placed in chart (order not valid for inpatient use);Yellow form placed in chart (order not valid for inpatient use)      No chief complaint on file.   HPI: Patient is a 89 y.o. male seen today for medical management of chronic problems including: Senile dementia, A-fib, type 2 diabetes, hyperlipidemia, hypertension, and depression. Patient resides in memory care.  Last MMSE was 2/30.  He has wandered [place] wife expressed idea that he might be able to move out of memory care but I think this is probably the best place to prevent elopement and for care that he needs.  Hospice is involved and supportive care. He has a history of A-fib but is off rate controlling medicines as well as Eliquis.  He takes atorvastatin for his lipids and is off blood pressure  medicines.  He seems to spend most of his time sitting in the living room in memory care holding and use paper book in his hands but do not think he is actually reading either.  Past Medical History:  Diagnosis Date   Allergic rhinitis, cause unspecified    BPH (benign prostatic hyperplasia)    Carotid artery stenosis, asymptomatic    1-39% by dopplers 09/2016 followed by Dr. Darrick Penna   Cataracts, bilateral    Chronic kidney disease (CKD), stage III (moderate) (HCC)    Complete heart block (HCC) 01/23/04   s/p Medtronic PPM implanted by Dr Amil Amen   COPD (chronic obstructive pulmonary disease) (HCC)    Diabetes mellitus    Glaucoma    Hyperlipidemia    Hypertension    Insomnia due to medical condition 08/10/2014   Kidney stones    PAF (paroxysmal atrial fibrillation) (HCC) 10/09/2014   Noted on pacer check with 88 mode switches and longest episode >1 hour.  Now on Apixiban for CHADS2VASC score of 5   Past Surgical History:  Procedure Laterality Date   PACEMAKER INSERTION  2005, 04/16/12   MDT implanted by Dr Amil Amen with generator chnage (MDT Adapta L) by Dr Johney Frame 04/16/12   PERMANENT PACEMAKER GENERATOR CHANGE N/A 04/16/2012   Procedure: PERMANENT PACEMAKER GENERATOR CHANGE;  Surgeon: Hillis Range, MD;  Location: [facility] CATH LAB;  Service: Cardiovascular;  Laterality: N/A;   RIGHT/LEFT HEART CATH AND CORONARY ANGIOGRAPHY N/A 10/18/2020   Procedure: RIGHT/LEFT HEART CATH AND CORONARY ANGIOGRAPHY;  Surgeon: Tresa Endo,  Clovis Pu, MD;  Location: [facility] INVASIVE CV LAB;  Service: Cardiovascular;  Laterality: N/A;    reports that he quit smoking about 24 years ago. His smoking use included cigarettes. He has a 25.00 pack-year smoking history. He has never used smokeless tobacco. He reports that he does not currently use alcohol. He reports that he does not use drugs. Social History   Socioeconomic History   Marital status: Married    Spouse name: [name]   Number of children: 2   Years of education: BS    Highest education level: Not on file  Occupational History   Occupation: retired  Tobacco Use   Smoking status: Former    Packs/day: 1.00    Years: 25.00    Additional pack years: 0.00    Total pack years: 25.00    Types: Cigarettes    Quit date: 09/16/1998    Years since quitting: 24.5   Smokeless tobacco: Never  Vaping Use   Vaping Use: Never used  Substance and Sexual Activity   Alcohol use: Not Currently    Comment: 1-2 a month glasses of wine   Drug use: No   Sexual activity: Not on file  Other Topics Concern   Not on file  Social History Narrative   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   Patient is married with 2 children.   Patient is right handed.   Patient has BS degree.   Patient drinks 1 cup daily.         Social Determinants of Health   Financial Resource Strain: Not on file  Food Insecurity: Not on file  Transportation Needs: Not on file  Physical Activity: Not on file  Stress: Not on file  Social Connections: Not on file  Intimate Partner Violence: Not on file    Functional Status Survey:    Family History  Problem Relation Age of Onset   Heart disease Father    Heart attack Father    Alzheimer's disease Sister     Health Maintenance  Topic Date Due   OPHTHALMOLOGY EXAM  Never done   COVID-19 Vaccine (7 - 2023-24 season) 09/12/2022   HEMOGLOBIN A1C  12/29/2022   INFLUENZA VACCINE  04/17/2023   FOOT EXAM  10/19/2023   DTaP/Tdap/Td (2 - Td or Tdap) 10/28/2032   Pneumonia Vaccine 77+ Years old  Completed   Zoster Vaccines- Shingrix  Completed   HPV VACCINES  Aged Out    Allergies  Allergen Reactions   Penicillins Rash and Other (See Comments)    Childhood allergy    Irbesartan     Per [facility]   Penicillin G Benzathine Rash    Outpatient Encounter Medications as of 03/18/2023  Medication Sig   acetaminophen (TYLENOL) 325 MG tablet Take 2 tablets (650 mg total) by mouth every 6 (six) hours as needed for mild pain or headache.    clotrimazole (LOTRIMIN) 1 % cream Apply 1 Application topically 2 (two) times daily. Apply to Right Great Toenail topically two times a day for toenail fungus   divalproex (DEPAKOTE SPRINKLE) 125 MG capsule Take 250 mg by mouth 3 (three) times daily.   Docusate Sodium (DOCU LIQUID PO) Take 10 mLs by mouth 2 (two) times daily.   escitalopram (LEXAPRO) 10 MG tablet Take 10 mg by mouth daily.   ipratropium-albuterol (DUONEB) 0.5-2.5 (3) MG/3ML SOLN Take 3 mLs by nebulization every 8 (eight) hours as needed.   levothyroxine (SYNTHROID) 75 MCG tablet Take 75 mcg by mouth  daily before breakfast.   LORazepam (ATIVAN) 1 MG tablet Take 1 mg by mouth 2 (two) times a week. Give 1 mg by mouth  one time a day every Monday and Thursday.   magnesium sulfate (EPSOM SALT) GRAN Apply to Right foot topically one time a day for nail infection soak R foot/great toe daily x 15 minutes until healed.   mupirocin ointment (BACTROBAN) 2 % Apply 1 Application topically daily. Apply to Right Great Toe  for nail infection daily until healed, apply to nail base   nitroGLYCERIN (NITROSTAT) 0.4 MG SL tablet Place 1 tablet (0.4 mg total) under the tongue every 5 (five) minutes as needed for chest pain.   senna (SENOKOT) 8.6 MG TABS tablet Take 2 tablets by mouth daily as needed for mild constipation. Give 2 tablet by mouth at bedtime for constipation   No facility-administered encounter medications on file as of 03/18/2023.    Review of Systems  Unable to perform ROS: Dementia    There were no vitals filed for this visit. There is no height or weight on file to calculate BMI. Physical Exam Constitutional:      Appearance: Normal appearance.  HENT:     Head: Normocephalic.  Cardiovascular:     Rate and Rhythm: Normal rate.  Pulmonary:     Effort: Pulmonary effort is normal.     Breath sounds: Normal breath sounds.  Musculoskeletal:        General: Normal range of motion.  Skin:    General: Skin is dry.   Neurological:     General: No focal deficit present.     Mental Status: He is alert.     Comments: Patient does not cooperate with exam may say yes or no but does not answer questions otherwise  Psychiatric:        Mood and Affect: Mood normal.     Comments: Flat affect     Labs reviewed: Basic Metabolic Panel: Recent Labs    06/29/22 2006 07/01/22 0238 07/02/22 0242 08/13/22 0000 01/04/23 0000  NA 143 140 142 144 140  K 4.5 3.9 3.6 3.8 3.9  CL 111 108 108 109* 106  CO2 21* 21* 25 27* 21  GLUCOSE 209* 199* 112*  --   --   BUN 22 34* 27* 22* 48*  CREATININE 1.67* 1.76* 1.65* 1.4* 1.7*  CALCIUM 8.8* 8.5* 8.8* 8.9 8.9   Liver Function Tests: Recent Labs    06/29/22 1545 08/13/22 0000  AST 25 15  ALT 29 19  ALKPHOS 77 80  BILITOT 0.5  --   PROT 6.2*  --   ALBUMIN 3.6 4.0   No results for input(s): "LIPASE", "AMYLASE" in the last 8760 hours. No results for input(s): "AMMONIA" in the last 8760 hours. CBC: Recent Labs    06/29/22 1545 06/29/22 1605 07/01/22 0238 07/02/22 0242 08/13/22 0000 01/04/23 0000  WBC 6.4  --  6.4 4.8 5.5 7.0  NEUTROABS 4.8  --   --   --  2,690.00  --   HGB 14.4   < > 14.3 14.9 14.0 11.7*  HCT 44.5   < > 43.3 45.7 44 35*  MCV 82.9  --  81.5 81.5  --   --   PLT 222  --  213 229 246 168   < > = values in this interval not displayed.   Cardiac Enzymes: No results for input(s): "CKTOTAL", "CKMB", "CKMBINDEX", "TROPONINI" in the last 8760 hours. BNP: Invalid input(s): "POCBNP" Lab Results  Component Value Date   HGBA1C 9.5 (H) 06/29/2022   No results found for: "TSH" No results found for: "VITAMINB12" No results found for: "FOLATE" Lab Results  Component Value Date   FERRITIN 103 07/02/2022    Imaging and Procedures obtained prior to SNF admission: CT Head Wo Contrast  Result Date: 06/29/2022 CLINICAL DATA:  Increased lethargy and weakness, COVID EXAM: CT HEAD WITHOUT CONTRAST TECHNIQUE: Contiguous axial images were obtained  from the base of the skull through the vertex without intravenous contrast. RADIATION DOSE REDUCTION: This exam was performed according to the departmental dose-optimization program which includes automated exposure control, adjustment of the mA and/or kV according to patient size and/or use of iterative reconstruction technique. COMPARISON:  05/17/2021 FINDINGS: Brain: No evidence of acute infarction, hemorrhage, mass, mass effect, or midline shift. No hydrocephalus or extra-axial fluid collection. Previously noted right cerebral convexity hematoma has resolved. Periventricular white matter changes, likely the sequela of chronic small vessel ischemic disease. Vascular: No hyperdense vessel. Skull: Normal. Negative for fracture or focal lesion. Sinuses/Orbits: Mild mucosal thickening in the ethmoid air cells. Status post bilateral lens replacements. Other: The mastoid air cells are well aerated. IMPRESSION: No acute intracranial process. A subdural hematoma noted on the 05/17/2021 head CT has resolved. Electronically Signed   By: Wiliam Ke M.D.   On: 06/29/2022 17:25   DG Chest Port 1 View  Result Date: 06/29/2022 CLINICAL DATA:  COVID-19 infection. Hypoxia. Increased lethargy and weakness since yesterday. Rhonchi. EXAM: PORTABLE CHEST 1 VIEW COMPARISON:  05/17/2021 FINDINGS: Normal sized heart. Aortic arch calcifications. Clear lungs with normal vascularity. Stable right subclavian bipolar pacemaker leads. Old, healed right rib fractures. IMPRESSION: No acute abnormality. Electronically Signed   By: Beckie Salts M.D.   On: 06/29/2022 15:51    Assessment/Plan 1. Adult failure to thrive Patient eats most of his food does not appear to be losing weight  2. Chronic renal impairment, stage 3a (HCC) GFR declined to 29 compared to 48 7 months ago with stable creatinine around 1.7  3. 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 Hyacinth Meeker, MD [facility] [address] [place], [place] [address] Office [phone]

## 2023-03-24 ENCOUNTER — Non-Acute Institutional Stay (SKILLED_NURSING_FACILITY): Payer: PPO | Admitting: Nurse Practitioner

## 2023-03-24 ENCOUNTER — Encounter: Payer: Self-pay | Admitting: Nurse Practitioner

## 2023-03-24 DIAGNOSIS — E0849 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: Secondary | ICD-10-CM

## 2023-03-24 DIAGNOSIS — R059 Cough, unspecified: Secondary | ICD-10-CM | POA: Insufficient documentation

## 2023-03-24 DIAGNOSIS — F0394 Unspecified dementia, unspecified severity, with anxiety: Secondary | ICD-10-CM | POA: Diagnosis not present

## 2023-03-24 DIAGNOSIS — I10 Essential (primary) hypertension: Secondary | ICD-10-CM

## 2023-03-24 DIAGNOSIS — F03918 Unspecified dementia, unspecified severity, with other behavioral disturbance: Secondary | ICD-10-CM

## 2023-03-24 DIAGNOSIS — E039 Hypothyroidism, unspecified: Secondary | ICD-10-CM

## 2023-03-24 DIAGNOSIS — R1310 Dysphagia, unspecified: Secondary | ICD-10-CM | POA: Diagnosis not present

## 2023-03-24 DIAGNOSIS — J439 Emphysema, unspecified: Secondary | ICD-10-CM | POA: Diagnosis not present

## 2023-03-24 DIAGNOSIS — I480 Paroxysmal atrial fibrillation: Secondary | ICD-10-CM

## 2023-03-24 DIAGNOSIS — I442 Atrioventricular block, complete: Secondary | ICD-10-CM

## 2023-03-24 DIAGNOSIS — R051 Acute cough: Secondary | ICD-10-CM

## 2023-03-24 NOTE — Assessment & Plan Note (Signed)
Mood is stable, on Lexapro, stabilizing mood

## 2023-03-24 NOTE — Assessment & Plan Note (Signed)
Blood pressure is controlled, off Valsartan 

## 2023-03-24 NOTE — Assessment & Plan Note (Signed)
on diet, off FArxiga, Hgb a1c 7.7 01/16/23 

## 2023-03-24 NOTE — Assessment & Plan Note (Signed)
taking Levothyroxine, TSH 2.82 06/13/22 

## 2023-03-24 NOTE — Assessment & Plan Note (Signed)
off  Donepezil, Memantine, on Depakote, followed by Neurology, Hospice for supportive care, MMSE 2/30 06/07/22 

## 2023-03-24 NOTE — Assessment & Plan Note (Signed)
Stable,  on Statin, prn NTG,  s/p PPM

## 2023-03-24 NOTE — Progress Notes (Signed)
Location:   SNF [facility] Room Number: 110 Place of Service:  SNF (31) Provider: Arna Snipe Mckay Brandt NP  Adamari Frede X, NP  Patient Care Team: Riverlyn Kizziah X, NP as PCP - General (Internal Medicine) Quintella Reichert, MD as PCP - Cardiology (Cardiology) Renford Dills, MD (Internal Medicine)  Extended Emergency Contact Information Primary Emergency Contact: Ballowe,[name] Address: [address]          [place], [place] [address] [place] Home Phone: [phone] Mobile Phone: [phone] Relation: Spouse Secondary Emergency Contact: Charbonnet,[name] Mobile Phone: [phone] Relation: Son  Code Status: DNR Goals of care: Advanced Directive information    02/11/2023    2:16 PM  Advanced Directives  Does Patient Have a Medical Advance Directive? Yes  Type of Estate agent of [place];Living will;Out of facility DNR (pink MOST or yellow form)  Does patient want to make changes to medical advance directive? No - Patient declined  Copy of Healthcare Power of Attorney in Chart? Yes - validated most recent copy scanned in chart (See row information)  Pre-existing out of facility DNR order (yellow form or pink MOST form) Pink MOST form placed in chart (order not valid for inpatient use);Yellow form placed in chart (order not valid for inpatient use)     Chief Complaint  Patient presents with   Acute Visit    Congestive cough, raspy voice    HPI:  Pt is a 89 y.o. male seen today for an acute visit for raspy voice, congestive cough, poor historian 2/2 dementia, no noted fever, SOB, or O2 desaturation.     Dysphagia III diet Senile dementia,  off  Donepezil, Memantine, on Depakote, followed by Neurology, Hospice for supportive care, MMSE 2/30 06/07/22 Depression, on Lexapro, stabilizing mood             Adult Failure To Thrive, weight loss-stable, followed by dietary             Afib followed by Cardiology, heart rate is in control, off  Eliquis, Hgb  11.7 01/04/23             CAD, on Statin, prn NTG             CHFrEF, off Farxiga, EF 30-35%             CHB s/p PPM             T2DM, on diet, off FArxiga, Hgb a1c 7.7 01/16/23             Hypothyroidism, taking Levothyroxine, TSH 2.82 06/13/22             Hyperlipidemia, takes Atorvastatin, LDL 60 06/13/22             HTN, off Valsartan             CKD chronic, Bun/creat 48/1.7 01/04/23   Past Medical History:  Diagnosis Date   Allergic rhinitis, cause unspecified    BPH (benign prostatic hyperplasia)    Carotid artery stenosis, asymptomatic    1-39% by dopplers 09/2016 followed by Dr. Darrick Penna   Cataracts, bilateral    Chronic kidney disease (CKD), stage III (moderate) (HCC)    Complete heart block (HCC) 01/23/04   s/p Medtronic PPM implanted by Dr Amil Amen   COPD (chronic obstructive pulmonary disease) (HCC)    Diabetes mellitus    Glaucoma    Hyperlipidemia    Hypertension    Insomnia due to medical condition 08/10/2014   Kidney  stones    PAF (paroxysmal atrial fibrillation) (HCC) 10/09/2014   Noted on pacer check with 88 mode switches and longest episode >1 hour.  Now on Apixiban for CHADS2VASC score of 5   Past Surgical History:  Procedure Laterality Date   PACEMAKER INSERTION  2005, 04/16/12   MDT implanted by Dr Amil Amen with generator chnage (MDT Adapta L) by Dr Johney Frame 04/16/12   PERMANENT PACEMAKER GENERATOR CHANGE N/A 04/16/2012   Procedure: PERMANENT PACEMAKER GENERATOR CHANGE;  Surgeon: Hillis Range, MD;  Location: [facility] CATH LAB;  Service: Cardiovascular;  Laterality: N/A;   RIGHT/LEFT HEART CATH AND CORONARY ANGIOGRAPHY N/A 10/18/2020   Procedure: RIGHT/LEFT HEART CATH AND CORONARY ANGIOGRAPHY;  Surgeon: Lennette Bihari, MD;  Location: [facility] INVASIVE CV LAB;  Service: Cardiovascular;  Laterality: N/A;    Allergies  Allergen Reactions   Penicillins Rash and Other (See Comments)    Childhood allergy    Irbesartan     Per [facility]   Penicillin G Benzathine Rash    Allergies as of  03/24/2023       Reactions   Penicillins Rash, Other (See Comments)   Childhood allergy    Irbesartan    Per [facility]   Penicillin G Benzathine Rash        Medication List        Accurate as of March 24, 2023 12:15 PM. If you have any questions, ask your nurse or doctor.          acetaminophen 325 MG tablet Commonly known as: TYLENOL Take 2 tablets (650 mg total) by mouth every 6 (six) hours as needed for mild pain or headache.   clotrimazole 1 % cream Commonly known as: LOTRIMIN Apply 1 Application topically 2 (two) times daily. Apply to Right Great Toenail topically two times a day for toenail fungus   divalproex 125 MG capsule Commonly known as: DEPAKOTE SPRINKLE Take 250 mg by mouth 3 (three) times daily.   DOCU LIQUID PO Take 10 mLs by mouth 2 (two) times daily.   escitalopram 10 MG tablet Commonly known as: LEXAPRO Take 10 mg by mouth daily.   ipratropium-albuterol 0.5-2.5 (3) MG/3ML Soln Commonly known as: DUONEB Take 3 mLs by nebulization every 8 (eight) hours as needed.   levothyroxine 75 MCG tablet Commonly known as: SYNTHROID Take 75 mcg by mouth daily before breakfast.   LORazepam 1 MG tablet Commonly known as: ATIVAN Take 1 mg by mouth 2 (two) times a week. Give 1 mg by mouth  one time a day every Monday and Thursday.   magnesium sulfate Gran Commonly known as: EPSOM SALT Apply to Right foot topically one time a day for nail infection soak R foot/great toe daily x 15 minutes until healed.   mupirocin ointment 2 % Commonly known as: BACTROBAN Apply 1 Application topically daily. Apply to Right Great Toe  for nail infection daily until healed, apply to nail base   nitroGLYCERIN 0.4 MG SL tablet Commonly known as: NITROSTAT Place 1 tablet (0.4 mg total) under the tongue every 5 (five) minutes as needed for chest pain.   senna 8.6 MG Tabs tablet Commonly known as: SENOKOT Take 2 tablets by mouth daily as needed for mild constipation. Give 2 tablet by  mouth at bedtime for constipation        Review of Systems  Unable to perform ROS: Dementia    Immunization History  Administered Date(s) Administered   Influenza-Unspecified 07/04/2014, 08/05/2022   Moderna Covid-19 Vaccine Bivalent Booster 71yrs &  up 07/18/2022   Moderna SARS-COV2 Booster Vaccination 07/25/2020, 02/13/2021   Moderna Sars-Covid-2 Vaccination 09/20/2019, 10/18/2019   PNEUMOCOCCAL CONJUGATE-20 08/07/2022   Pfizer Covid-19 Vaccine Bivalent Booster 75yrs & up 06/20/2021   Respiratory Syncytial Virus Vaccine,Recomb Aduvanted(Arexvy) 10/04/2022   Tdap 10/28/2022   Zoster Recombinant(Shingrix) 11/06/2017, 03/28/2018   Pertinent  Health Maintenance Due  Topic Date Due   OPHTHALMOLOGY EXAM  Never done   HEMOGLOBIN A1C  12/29/2022   INFLUENZA VACCINE  04/17/2023   FOOT EXAM  10/19/2023      07/05/2022    4:50 PM 10/18/2022   11:21 AM 11/04/2022    2:33 PM 11/15/2022    8:54 AM 12/26/2022   11:29 AM  Fall Risk  Falls in the past year? 0 0 0 0 0  Was there an injury with Fall? 0 0 0 0 0  Fall Risk Category Calculator 0 0 0 0 0  Fall Risk Category (Retired) Low      (RETIRED) Patient Fall Risk Level High fall risk      Patient at Risk for Falls Due to History of fall(s) No Fall Risks  No Fall Risks No Fall Risks  Fall risk Follow up Falls evaluation completed Falls evaluation completed Falls evaluation completed Falls evaluation completed Falls evaluation completed   Functional Status Survey:    Vitals:   03/24/23 1200  BP: (!) 140/70  Pulse: 80  Resp: 18  Temp: (!) 97.2 F (36.2 C)  SpO2: 94%  Weight: 147 lb 12.8 oz (67 kg)   Body mass index is 23.86 kg/m. Physical Exam Vitals and nursing note reviewed.  Constitutional:      Comments: Appears tired  HENT:     Head: Normocephalic and atraumatic.     Nose: Nose normal.     Comments: Clear nasal drainage.     Mouth/Throat:     Mouth: Mucous membranes are moist.     Comments: Unable to examine  throat Eyes:     Extraocular Movements: Extraocular movements intact.     Conjunctiva/sclera: Conjunctivae normal.     Pupils: Pupils are equal, round, and reactive to light.  Cardiovascular:     Rate and Rhythm: Normal rate and regular rhythm.     Heart sounds: No murmur heard. Pulmonary:     Effort: Pulmonary effort is normal.     Comments: Central congestion, diffuse decreased breath sounds posteriorly Abdominal:     General: Bowel sounds are normal.     Palpations: Abdomen is soft.     Tenderness: There is no abdominal tenderness.  Genitourinary:    Comments: External hemorrhoids from previous examination.  Musculoskeletal:     Cervical back: Normal range of motion and neck supple.     Right lower leg: No edema.     Left lower leg: No edema.  Skin:    General: Skin is warm and dry.     Findings: Erythema present.     Comments:  yellow, thick right great toe, has been treated for paronychia, small open irritated area at the base, but the right toe redness, pain, swelling, warmth improved  Neurological:     General: No focal deficit present.     Mental Status: He is alert. Mental status is at baseline.     Gait: Gait abnormal.     Comments: Oriented to person, furniture walking.   Psychiatric:     Comments: Flat affect, non cooperative.      Labs reviewed: Recent Labs    06/29/22 2006  07/01/22 0238 07/02/22 0242 08/13/22 0000 01/04/23 0000  NA 143 140 142 144 140  K 4.5 3.9 3.6 3.8 3.9  CL 111 108 108 109* 106  CO2 21* 21* 25 27* 21  GLUCOSE 209* 199* 112*  --   --   BUN 22 34* 27* 22* 48*  CREATININE 1.67* 1.76* 1.65* 1.4* 1.7*  CALCIUM 8.8* 8.5* 8.8* 8.9 8.9   Recent Labs    06/29/22 1545 08/13/22 0000  AST 25 15  ALT 29 19  ALKPHOS 77 80  BILITOT 0.5  --   PROT 6.2*  --   ALBUMIN 3.6 4.0   Recent Labs    06/29/22 1545 06/29/22 1605 07/01/22 0238 07/02/22 0242 08/13/22 0000 01/04/23 0000  WBC 6.4  --  6.4 4.8 5.5 7.0  NEUTROABS 4.8  --   --    --  2,690.00  --   HGB 14.4   < > 14.3 14.9 14.0 11.7*  HCT 44.5   < > 43.3 45.7 44 35*  MCV 82.9  --  81.5 81.5  --   --   PLT 222  --  213 229 246 168   < > = values in this interval not displayed.   No results found for: "TSH" Lab Results  Component Value Date   HGBA1C 9.5 (H) 06/29/2022   Lab Results  Component Value Date   CHOL 128 12/16/2016   HDL 55 12/16/2016   LDLCALC 58 12/16/2016   TRIG 97 06/29/2022   CHOLHDL 2.3 12/16/2016    Significant Diagnostic Results in last 30 days:  No results found.  Assessment/Plan: Cough raspy voice, congestive cough, poor historian 2/2 dementia, no noted fever, SOB, or O2 desaturation.  Obtain CXR ap/lateral, start Mucinex 600mg  bid po x 5 days.   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   Labs/tests ordered:  CXR ap/lateral  Time spend 35 minutes.

## 2023-03-24 NOTE — Assessment & Plan Note (Signed)
heart rate is in control, off  Eliquis, Hgb 11.7 01/04/23 

## 2023-03-24 NOTE — Assessment & Plan Note (Signed)
Dysphagia III diet.  ?

## 2023-03-24 NOTE — Assessment & Plan Note (Signed)
raspy voice, congestive cough, poor historian 2/2 dementia, no noted fever, SOB, or O2 desaturation.  Obtain CXR ap/lateral, start Mucinex 600mg  bid po x 5 days.  03/24/23 CXR emphysema, nonspecific perihilar inflammatory changes.

## 2023-04-11 ENCOUNTER — Encounter: Payer: Self-pay | Admitting: Nurse Practitioner

## 2023-04-11 ENCOUNTER — Non-Acute Institutional Stay: Payer: Self-pay | Admitting: Nurse Practitioner

## 2023-04-11 DIAGNOSIS — E7800 Pure hypercholesterolemia, unspecified: Secondary | ICD-10-CM

## 2023-04-11 DIAGNOSIS — R413 Other amnesia: Secondary | ICD-10-CM

## 2023-04-11 DIAGNOSIS — N1831 Chronic kidney disease, stage 3a: Secondary | ICD-10-CM

## 2023-04-11 DIAGNOSIS — E039 Hypothyroidism, unspecified: Secondary | ICD-10-CM

## 2023-04-11 DIAGNOSIS — R627 Adult failure to thrive: Secondary | ICD-10-CM

## 2023-04-11 DIAGNOSIS — Z950 Presence of cardiac pacemaker: Secondary | ICD-10-CM

## 2023-04-11 DIAGNOSIS — I480 Paroxysmal atrial fibrillation: Secondary | ICD-10-CM

## 2023-04-11 DIAGNOSIS — I10 Essential (primary) hypertension: Secondary | ICD-10-CM | POA: Diagnosis not present

## 2023-04-11 DIAGNOSIS — F0394 Unspecified dementia, unspecified severity, with anxiety: Secondary | ICD-10-CM

## 2023-04-11 DIAGNOSIS — E0849 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: Secondary | ICD-10-CM

## 2023-04-11 NOTE — Assessment & Plan Note (Signed)
taking Levothyroxine, TSH 2.82 06/13/22 

## 2023-04-11 NOTE — Assessment & Plan Note (Signed)
weight loss-stable, followed by dietary

## 2023-04-11 NOTE — Assessment & Plan Note (Signed)
followed by Cardiology, heart rate is in control, off  Eliquis, Hgb 11.7 01/04/23 

## 2023-04-11 NOTE — Assessment & Plan Note (Signed)
takes Atorvastatin, LDL 60 06/13/22 

## 2023-04-11 NOTE — Assessment & Plan Note (Signed)
CAD, on Statin, prn NTG             CHFrEF, off Farxiga, EF 30-35%             CHB s/p PPM

## 2023-04-11 NOTE — Assessment & Plan Note (Signed)
on Lexapro, stabilizing mood 

## 2023-04-11 NOTE — Assessment & Plan Note (Signed)
off  Donepezil, Memantine, on Depakote, followed by Neurology, Hospice for supportive care, MMSE 2/30 06/07/22 

## 2023-04-11 NOTE — Progress Notes (Signed)
Location:  [facility] Room Number: 110A Place of Service:  SNF (31) Provider:   Jacaria Colburn X, NP   Korianna Washer X, NP  Patient Care Team: Kadyn Guild X, NP as PCP - General (Internal Medicine) Quintella Reichert, MD as PCP - Cardiology (Cardiology) Renford Dills, MD (Internal Medicine)  Extended Emergency Contact Information Primary Emergency Contact: Mcleish,[name] Address: [address]          [place], [place] [address] [place] Home Phone: [phone] Mobile Phone: [phone] Relation: Spouse Secondary Emergency Contact: Arreaga,[name] Mobile Phone: [phone] Relation: Son  Code Status:  DNR Goals of care: Advanced Directive information    04/11/2023   10:12 AM  Advanced Directives  Does Patient Have a Medical Advance Directive? Yes  Type of Estate agent of [place];Living will;Out of facility DNR (pink MOST or yellow form)  Does patient want to make changes to medical advance directive? No - Patient declined  Copy of Healthcare Power of Attorney in Chart? Yes - validated most recent copy scanned in chart (See row information)  Pre-existing out of facility DNR order (yellow form or pink MOST form) Pink MOST form placed in chart (order not valid for inpatient use);Yellow form placed in chart (order not valid for inpatient use)     Chief Complaint  Patient presents with   Medical Management of Chronic Issues    Patient is being seen for a routine visit     HPI:  Pt is a 89 y.o. male seen today for medical management of chronic diseases.   Dysphagia III diet Senile dementia,  off  Donepezil, Memantine, on Depakote, followed by Neurology, Hospice for supportive care, MMSE 2/30 06/07/22 Depression, on Lexapro, stabilizing mood             Adult Failure To Thrive, weight loss-stable, followed by dietary             Afib followed by Cardiology, heart rate is in control, off  Eliquis, Hgb 11.7 01/04/23              CAD, on Statin, prn NTG             CHFrEF, off Farxiga, EF 30-35%             CHB s/p PPM             T2DM, on diet, off FArxiga, Hgb a1c 7.7 01/16/23             Hypothyroidism, taking Levothyroxine, TSH 2.82 06/13/22             Hyperlipidemia, takes Atorvastatin, LDL 60 06/13/22             HTN, off Valsartan             CKD chronic, Bun/creat 48/1.7 01/04/23     Past Medical History:  Diagnosis Date   Allergic rhinitis, cause unspecified    BPH (benign prostatic hyperplasia)    Carotid artery stenosis, asymptomatic    1-39% by dopplers 09/2016 followed by Dr. Darrick Penna   Cataracts, bilateral    Chronic kidney disease (CKD), stage III (moderate) (HCC)    Complete heart block (HCC) 01/23/04   s/p Medtronic PPM implanted by Dr Amil Amen   COPD (chronic obstructive pulmonary disease) (HCC)    Diabetes mellitus    Glaucoma    Hyperlipidemia    Hypertension    Insomnia due to medical condition 08/10/2014   Kidney stones  PAF (paroxysmal atrial fibrillation) (HCC) 10/09/2014   Noted on pacer check with 88 mode switches and longest episode >1 hour.  Now on Apixiban for CHADS2VASC score of 5   Past Surgical History:  Procedure Laterality Date   PACEMAKER INSERTION  2005, 04/16/12   MDT implanted by Dr Amil Amen with generator chnage (MDT Adapta L) by Dr Johney Frame 04/16/12   PERMANENT PACEMAKER GENERATOR CHANGE N/A 04/16/2012   Procedure: PERMANENT PACEMAKER GENERATOR CHANGE;  Surgeon: Hillis Range, MD;  Location: [facility] CATH LAB;  Service: Cardiovascular;  Laterality: N/A;   RIGHT/LEFT HEART CATH AND CORONARY ANGIOGRAPHY N/A 10/18/2020   Procedure: RIGHT/LEFT HEART CATH AND CORONARY ANGIOGRAPHY;  Surgeon: Lennette Bihari, MD;  Location: [facility] INVASIVE CV LAB;  Service: Cardiovascular;  Laterality: N/A;    Allergies  Allergen Reactions   Penicillins Rash and Other (See Comments)    Childhood allergy    Irbesartan     Per [facility]   Penicillin G Benzathine Rash    Outpatient Encounter Medications as of  04/11/2023  Medication Sig   acetaminophen (TYLENOL) 325 MG tablet Take 2 tablets (650 mg total) by mouth every 6 (six) hours as needed for mild pain or headache.   dextromethorphan-guaiFENesin (ROBITUSSIN-DM) 10-100 MG/5ML liquid Take 10 mLs by mouth every 6 (six) hours as needed for cough.   divalproex (DEPAKOTE SPRINKLE) 125 MG capsule Take 250 mg by mouth 3 (three) times daily.   Docusate Sodium (DOCU LIQUID PO) Take 10 mLs by mouth 2 (two) times daily.   escitalopram (LEXAPRO) 10 MG tablet Take 10 mg by mouth daily.   ipratropium-albuterol (DUONEB) 0.5-2.5 (3) MG/3ML SOLN Take 3 mLs by nebulization every 8 (eight) hours as needed.   levothyroxine (SYNTHROID) 75 MCG tablet Take 75 mcg by mouth daily before breakfast.   LORazepam (ATIVAN) 1 MG tablet Take 1 mg by mouth 2 (two) times a week. Give 1 mg by mouth  one time a day every Monday and Thursday.   nitroGLYCERIN (NITROSTAT) 0.4 MG SL tablet Place 1 tablet (0.4 mg total) under the tongue every 5 (five) minutes as needed for chest pain.   senna (SENOKOT) 8.6 MG TABS tablet Take 2 tablets by mouth daily as needed for mild constipation. Give 2 tablet by mouth at bedtime for constipation   clotrimazole (LOTRIMIN) 1 % cream Apply 1 Application topically 2 (two) times daily. Apply to Right Great Toenail topically two times a day for toenail fungus (Patient not taking: Reported on 04/11/2023)   magnesium sulfate (EPSOM SALT) GRAN Apply to Right foot topically one time a day for nail infection soak R foot/great toe daily x 15 minutes until healed. (Patient not taking: Reported on 04/11/2023)   mupirocin ointment (BACTROBAN) 2 % Apply 1 Application topically daily. Apply to Right Great Toe  for nail infection daily until healed, apply to nail base (Patient not taking: Reported on 04/11/2023)   No facility-administered encounter medications on file as of 04/11/2023.    Review of Systems  Unable to perform ROS: Dementia    Immunization History   Administered Date(s) Administered   Influenza-Unspecified 07/04/2014, 08/05/2022   Moderna Covid-19 Vaccine Bivalent Booster 24yrs & up 07/18/2022   Moderna SARS-COV2 Booster Vaccination 07/25/2020, 02/13/2021   Moderna Sars-Covid-2 Vaccination 09/20/2019, 10/18/2019   PNEUMOCOCCAL CONJUGATE-20 08/07/2022   Pfizer Covid-19 Vaccine Bivalent Booster 26yrs & up 06/20/2021   Respiratory Syncytial Virus Vaccine,Recomb Aduvanted(Arexvy) 10/04/2022   Tdap 10/28/2022   Zoster Recombinant(Shingrix) 11/06/2017, 03/28/2018   Pertinent  Health Maintenance Due  Topic Date Due   OPHTHALMOLOGY EXAM  Never done   INFLUENZA VACCINE  04/17/2023   HEMOGLOBIN A1C  07/20/2023   FOOT EXAM  10/19/2023      07/05/2022    4:50 PM 10/18/2022   11:21 AM 11/04/2022    2:33 PM 11/15/2022    8:54 AM 12/26/2022   11:29 AM  Fall Risk  Falls in the past year? 0 0 0 0 0  Was there an injury with Fall? 0 0 0 0 0  Fall Risk Category Calculator 0 0 0 0 0  Fall Risk Category (Retired) Low      (RETIRED) Patient Fall Risk Level High fall risk      Patient at Risk for Falls Due to History of fall(s) No Fall Risks  No Fall Risks No Fall Risks  Fall risk Follow up Falls evaluation completed Falls evaluation completed Falls evaluation completed Falls evaluation completed Falls evaluation completed   Functional Status Survey:    Vitals:   04/11/23 1004  BP: 129/80  Pulse: 63  Resp: 20  Temp: (!) 97.2 F (36.2 C)  TempSrc: Temporal  SpO2: 94%  Weight: 147 lb 12.8 oz (67 kg)  Height: 5\' 6"  (1.676 m)   Body mass index is 23.86 kg/m. Physical Exam Vitals and nursing note reviewed.  Constitutional:      Appearance: Normal appearance.  HENT:     Head: Normocephalic and atraumatic.     Nose: Nose normal.     Comments: Clear nasal drainage.     Mouth/Throat:     Mouth: Mucous membranes are moist.     Comments: Unable to examine throat Eyes:     Extraocular Movements: Extraocular movements intact.      Conjunctiva/sclera: Conjunctivae normal.     Pupils: Pupils are equal, round, and reactive to light.  Cardiovascular:     Rate and Rhythm: Normal rate and regular rhythm.     Heart sounds: No murmur heard. Pulmonary:     Effort: Pulmonary effort is normal.     Comments: Central congestion, diffuse decreased breath sounds posteriorly Abdominal:     General: Bowel sounds are normal.     Palpations: Abdomen is soft.     Tenderness: There is no abdominal tenderness.  Genitourinary:    Comments: External hemorrhoids from previous examination.  Musculoskeletal:     Cervical back: Normal range of motion and neck supple.     Right lower leg: No edema.     Left lower leg: No edema.  Skin:    General: Skin is warm and dry.  Neurological:     General: No focal deficit present.     Mental Status: He is alert. Mental status is at baseline.     Gait: Gait abnormal.     Comments: Oriented to person, furniture walking.   Psychiatric:     Comments: Flat affect, non cooperative.      Labs reviewed: Recent Labs    06/29/22 2006 07/01/22 0238 07/02/22 0242 08/13/22 0000 01/04/23 0000  NA 143 140 142 144 140  K 4.5 3.9 3.6 3.8 3.9  CL 111 108 108 109* 106  CO2 21* 21* 25 27* 21  GLUCOSE 209* 199* 112*  --   --   BUN 22 34* 27* 22* 48*  CREATININE 1.67* 1.76* 1.65* 1.4* 1.7*  CALCIUM 8.8* 8.5* 8.8* 8.9 8.9   Recent Labs    06/29/22 1545 08/13/22 0000  AST 25 15  ALT 29 19  ALKPHOS 77  80  BILITOT 0.5  --   PROT 6.2*  --   ALBUMIN 3.6 4.0   Recent Labs    06/29/22 1545 06/29/22 1605 07/01/22 0238 07/02/22 0242 08/13/22 0000 01/04/23 0000  WBC 6.4  --  6.4 4.8 5.5 7.0  NEUTROABS 4.8  --   --   --  2,690.00  --   HGB 14.4   < > 14.3 14.9 14.0 11.7*  HCT 44.5   < > 43.3 45.7 44 35*  MCV 82.9  --  81.5 81.5  --   --   PLT 222  --  213 229 246 168   < > = values in this interval not displayed.   No results found for: "TSH" Lab Results  Component Value Date   HGBA1C 7.7  01/17/2023   Lab Results  Component Value Date   CHOL 128 12/16/2016   HDL 55 12/16/2016   LDLCALC 58 12/16/2016   TRIG 97 06/29/2022   CHOLHDL 2.3 12/16/2016    Significant Diagnostic Results in last 30 days:  No results found.  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) on diet, off FArxiga, Hgb a1c 7.7 01/16/23  Pacemaker-Medtronic CAD, on Statin, prn NTG             CHFrEF, off Farxiga, EF 30-35%             CHB s/p PPM  PAF (paroxysmal atrial fibrillation) (HCC) followed by Cardiology, heart rate is in control, off  Eliquis, Hgb 11.7 01/04/23  Adult failure to thrive  weight loss-stable, followed by dietary  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   Labs/tests ordered:  none  Time spend 35 minutes.

## 2023-04-11 NOTE — Assessment & Plan Note (Signed)
on diet, off FArxiga, Hgb a1c 7.7 01/16/23 

## 2023-04-11 NOTE — Assessment & Plan Note (Signed)
Blood pressure is controlled, off Valsartan 

## 2023-04-11 NOTE — Assessment & Plan Note (Signed)
Bun/creat 48/1.7 01/04/23

## 2023-04-25 ENCOUNTER — Encounter: Payer: Self-pay | Admitting: Family Medicine

## 2023-04-28 ENCOUNTER — Encounter: Payer: Self-pay | Admitting: Family Medicine

## 2023-05-06 DIAGNOSIS — E119 Type 2 diabetes mellitus without complications: Secondary | ICD-10-CM | POA: Diagnosis not present

## 2023-05-06 DIAGNOSIS — M79671 Pain in right foot: Secondary | ICD-10-CM | POA: Diagnosis not present

## 2023-05-06 DIAGNOSIS — L602 Onychogryphosis: Secondary | ICD-10-CM | POA: Diagnosis not present

## 2023-05-06 DIAGNOSIS — M79672 Pain in left foot: Secondary | ICD-10-CM | POA: Diagnosis not present

## 2023-05-15 ENCOUNTER — Encounter: Payer: Self-pay | Admitting: Nurse Practitioner

## 2023-05-15 ENCOUNTER — Non-Acute Institutional Stay: Payer: Self-pay | Admitting: Nurse Practitioner

## 2023-05-15 DIAGNOSIS — I480 Paroxysmal atrial fibrillation: Secondary | ICD-10-CM

## 2023-05-15 DIAGNOSIS — I5020 Unspecified systolic (congestive) heart failure: Secondary | ICD-10-CM

## 2023-05-15 DIAGNOSIS — F03918 Unspecified dementia, unspecified severity, with other behavioral disturbance: Secondary | ICD-10-CM | POA: Diagnosis not present

## 2023-05-15 DIAGNOSIS — I10 Essential (primary) hypertension: Secondary | ICD-10-CM

## 2023-05-15 DIAGNOSIS — F0394 Unspecified dementia, unspecified severity, with anxiety: Secondary | ICD-10-CM | POA: Diagnosis not present

## 2023-05-15 DIAGNOSIS — R197 Diarrhea, unspecified: Secondary | ICD-10-CM

## 2023-05-15 DIAGNOSIS — I442 Atrioventricular block, complete: Secondary | ICD-10-CM

## 2023-05-15 DIAGNOSIS — E039 Hypothyroidism, unspecified: Secondary | ICD-10-CM

## 2023-05-15 DIAGNOSIS — E0849 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: Secondary | ICD-10-CM

## 2023-05-15 NOTE — Progress Notes (Signed)
Location:   [facility] Room Number: 110A Place of Service:  SNF (31) Provider: Arna Snipe Yissel Habermehl NP  Adalynn Corne X, NP  Patient Care Team: Chassie Pennix X, NP as PCP - General (Internal Medicine) Quintella Reichert, MD as PCP - Cardiology (Cardiology) Renford Dills, MD (Internal Medicine)  Extended Emergency Contact Information Primary Emergency Contact: [name],[name] Address: [address]          [place], [place] [address] [place] Home Phone: [phone] Mobile Phone: [phone] Relation: Spouse Secondary Emergency Contact: [name],[name] Mobile Phone: [phone] Relation: Son  Code Status: DNR Goals of care: Advanced Directive information    05/15/2023   10:51 AM  Advanced Directives  Does Patient Have a Medical Advance Directive? Yes  Type of Estate agent of [place];Living will;Out of facility DNR (pink MOST or yellow form)  Does patient want to make changes to medical advance directive? No - Patient declined  Copy of Healthcare Power of Attorney in Chart? Yes - validated most recent copy scanned in chart (See row information)  Pre-existing out of facility DNR order (yellow form or pink MOST form) Pink MOST form placed in chart (order not valid for inpatient use);Yellow form placed in chart (order not valid for inpatient use)     Chief Complaint  Patient presents with   Medical Management of Chronic Issues    Routine visit   Immunizations    Covid and Influenza vaccine.   Quality Metric Gaps    Eye exam.    HPI:  Pt is a 89 y.o. male seen today for managing chronic medication conditions.   The patient was noted having loose stools, foul odor, 2-3x a day, denied abd pain, X-ray KUB unremarkable, afebrile, no N/V, in usual state of health.  Dysphagia III diet Senile dementia,  off  Donepezil, Memantine, on Depakote, followed by Neurology, Hospice for supportive care, MMSE 2/30 06/07/22 Depression/anxiety, on Lexapro,  Depakote, Lorazepam,  stabilizing mood             Adult Failure To Thrive, weight loss-stable, followed by dietary             Afib followed by Cardiology, heart rate is in control, off  Eliquis, Hgb 11.7 01/04/23             CAD, on Statin, prn NTG             CHFrEF, off Farxiga, EF 30-35%             CHB s/p PPM             T2DM, on diet, off FArxiga, Hgb a1c 7.7 01/16/23             Hypothyroidism, taking Levothyroxine, TSH 2.82 06/13/22             Hyperlipidemia, off  Atorvastatin, LDL 60 06/13/22             HTN, off Valsartan             CKD chronic, Bun/creat 48/1.7 01/04/23     Past Medical History:  Diagnosis Date   Allergic rhinitis, cause unspecified    BPH (benign prostatic hyperplasia)    Carotid artery stenosis, asymptomatic    1-39% by dopplers 09/2016 followed by Dr. Darrick Penna   Cataracts, bilateral    Chronic kidney disease (CKD), stage III (moderate) (HCC)    Complete heart block (HCC) 01/23/04   s/p Medtronic PPM implanted by Dr Amil Amen  COPD (chronic obstructive pulmonary disease) (HCC)    Diabetes mellitus    Glaucoma    Hyperlipidemia    Hypertension    Insomnia due to medical condition 08/10/2014   Kidney stones    PAF (paroxysmal atrial fibrillation) (HCC) 10/09/2014   Noted on pacer check with 88 mode switches and longest episode >1 hour.  Now on Apixiban for CHADS2VASC score of 5   Past Surgical History:  Procedure Laterality Date   PACEMAKER INSERTION  2005, 04/16/12   MDT implanted by Dr Amil Amen with generator chnage (MDT Adapta L) by Dr Johney Frame 04/16/12   PERMANENT PACEMAKER GENERATOR CHANGE N/A 04/16/2012   Procedure: PERMANENT PACEMAKER GENERATOR CHANGE;  Surgeon: Hillis Range, MD;  Location: [facility] CATH LAB;  Service: Cardiovascular;  Laterality: N/A;   RIGHT/LEFT HEART CATH AND CORONARY ANGIOGRAPHY N/A 10/18/2020   Procedure: RIGHT/LEFT HEART CATH AND CORONARY ANGIOGRAPHY;  Surgeon: Lennette Bihari, MD;  Location: [facility] INVASIVE CV LAB;  Service: Cardiovascular;   Laterality: N/A;    Allergies  Allergen Reactions   Penicillins Rash and Other (See Comments)    Childhood allergy    Irbesartan     Per [facility]   Penicillin G Benzathine Rash    Allergies as of 05/15/2023       Reactions   Penicillins Rash, Other (See Comments)   Childhood allergy    Irbesartan    Per [facility]   Penicillin G Benzathine Rash        Medication List        Accurate as of May 15, 2023 11:59 PM. If you have any questions, ask your nurse or doctor.          STOP taking these medications    clotrimazole 1 % cream Commonly known as: LOTRIMIN Stopped by: [name]   DOCU LIQUID PO Stopped by: [name]   magnesium sulfate Oliver Barre Commonly known as: EPSOM SALT Stopped by: [name]   mupirocin ointment 2 % Commonly known as: BACTROBAN Stopped by: [name]       TAKE these medications    acetaminophen 325 MG tablet Commonly known as: TYLENOL Take 2 tablets (650 mg total) by mouth every 6 (six) hours as needed for mild pain or headache.   dextromethorphan-guaiFENesin 10-100 MG/5ML liquid Commonly known as: ROBITUSSIN-DM Take 10 mLs by mouth every 6 (six) hours as needed for cough.   divalproex 125 MG capsule Commonly known as: DEPAKOTE SPRINKLE Take 250 mg by mouth 3 (three) times daily.   escitalopram 10 MG tablet Commonly known as: LEXAPRO Take 10 mg by mouth daily.   ipratropium-albuterol 0.5-2.5 (3) MG/3ML Soln Commonly known as: DUONEB Take 3 mLs by nebulization every 8 (eight) hours as needed.   levothyroxine 75 MCG tablet Commonly known as: SYNTHROID Take 75 mcg by mouth daily before breakfast.   LORazepam 1 MG tablet Commonly known as: ATIVAN Take 1 mg by mouth 2 (two) times a week. Give 1 mg by mouth  one time a day every Monday and Thursday.   nitroGLYCERIN 0.4 MG SL tablet Commonly known as: NITROSTAT Place 1 tablet (0.4 mg total) under the tongue every 5 (five) minutes as needed for chest pain.   senna 8.6 MG Tabs  tablet Commonly known as: SENOKOT Take 2 tablets by mouth daily as needed for mild constipation. Give 2 tablet by mouth at bedtime for constipation        Review of Systems  Unable to perform ROS: Dementia  Immunization History  Administered Date(s) Administered   Influenza-Unspecified 07/04/2014, 08/05/2022   Moderna Covid-19 Vaccine Bivalent Booster 5yrs & up 07/18/2022   Moderna SARS-COV2 Booster Vaccination 07/25/2020, 02/13/2021   Moderna Sars-Covid-2 Vaccination 09/20/2019, 10/18/2019   PNEUMOCOCCAL CONJUGATE-20 08/07/2022   Pfizer Covid-19 Vaccine Bivalent Booster 1yrs & up 06/20/2021   Respiratory Syncytial Virus Vaccine,Recomb Aduvanted(Arexvy) 10/04/2022   Tdap 10/28/2022   Zoster Recombinant(Shingrix) 11/06/2017, 03/28/2018   Pertinent  Health Maintenance Due  Topic Date Due   OPHTHALMOLOGY EXAM  Never done   INFLUENZA VACCINE  04/17/2023   HEMOGLOBIN A1C  07/20/2023   FOOT EXAM  10/19/2023      07/05/2022    4:50 PM 10/18/2022   11:21 AM 11/04/2022    2:33 PM 11/15/2022    8:54 AM 12/26/2022   11:29 AM  Fall Risk  Falls in the past year? 0 0 0 0 0  Was there an injury with Fall? 0 0 0 0 0  Fall Risk Category Calculator 0 0 0 0 0  Fall Risk Category (Retired) Low      (RETIRED) Patient Fall Risk Level High fall risk      Patient at Risk for Falls Due to History of fall(s) No Fall Risks  No Fall Risks No Fall Risks  Fall risk Follow up Falls evaluation completed Falls evaluation completed Falls evaluation completed Falls evaluation completed Falls evaluation completed   Functional Status Survey:    Vitals:   05/15/23 1042 05/16/23 1157  BP: (!) 144/72 (!) 142/72  Pulse: 65   Resp: (!) 21   Temp: (!) 97.3 F (36.3 C)   SpO2: 94%   Weight: 145 lb 6.4 oz (66 kg)   Height: 5\' 6"  (1.676 m)    Body mass index is 23.47 kg/m. Physical Exam Vitals and nursing note reviewed.  Constitutional:      Appearance: Normal appearance.  HENT:     Head:  Normocephalic and atraumatic.     Nose: Nose normal.     Comments: Clear nasal drainage.     Mouth/Throat:     Mouth: Mucous membranes are moist.     Comments: Unable to examine throat Eyes:     Extraocular Movements: Extraocular movements intact.     Conjunctiva/sclera: Conjunctivae normal.     Pupils: Pupils are equal, round, and reactive to light.  Cardiovascular:     Rate and Rhythm: Normal rate and regular rhythm.     Heart sounds: No murmur heard. Pulmonary:     Effort: Pulmonary effort is normal.     Comments: Central congestion, diffuse decreased breath sounds posteriorly Abdominal:     General: Bowel sounds are normal.     Palpations: Abdomen is soft.     Tenderness: There is no abdominal tenderness. There is no right CVA tenderness, left CVA tenderness, guarding or rebound.     Comments: Loose stools 2-3x/day  Genitourinary:    Comments: External hemorrhoids from previous examination.  Musculoskeletal:     Cervical back: Normal range of motion and neck supple.     Right lower leg: No edema.     Left lower leg: No edema.  Skin:    General: Skin is warm and dry.  Neurological:     General: No focal deficit present.     Mental Status: He is alert. Mental status is at baseline.     Gait: Gait abnormal.     Comments: Oriented to person, furniture walking.   Psychiatric:     Comments:  Flat affect, non cooperative.      Labs reviewed: Recent Labs    06/29/22 2006 07/01/22 0238 07/02/22 0242 08/13/22 0000 01/04/23 0000  NA 143 140 142 144 140  K 4.5 3.9 3.6 3.8 3.9  CL 111 108 108 109* 106  CO2 21* 21* 25 27* 21  GLUCOSE 209* 199* 112*  --   --   BUN 22 34* 27* 22* 48*  CREATININE 1.67* 1.76* 1.65* 1.4* 1.7*  CALCIUM 8.8* 8.5* 8.8* 8.9 8.9   Recent Labs    06/29/22 1545 08/13/22 0000  AST 25 15  ALT 29 19  ALKPHOS 77 80  BILITOT 0.5  --   PROT 6.2*  --   ALBUMIN 3.6 4.0   Recent Labs    06/29/22 1545 06/29/22 1605 07/01/22 0238 07/02/22 0242  08/13/22 0000 01/04/23 0000  WBC 6.4  --  6.4 4.8 5.5 7.0  NEUTROABS 4.8  --   --   --  2,690.00  --   HGB 14.4   < > 14.3 14.9 14.0 11.7*  HCT 44.5   < > 43.3 45.7 44 35*  MCV 82.9  --  81.5 81.5  --   --   PLT 222  --  213 229 246 168   < > = values in this interval not displayed.   No results found for: "TSH" Lab Results  Component Value Date   HGBA1C 7.7 01/17/2023   Lab Results  Component Value Date   CHOL 128 12/16/2016   HDL 55 12/16/2016   LDLCALC 58 12/16/2016   TRIG 97 06/29/2022   CHOLHDL 2.3 12/16/2016    Significant Diagnostic Results in last 30 days:  No results found.  Assessment/Plan: Frequent loose stools The patient was noted having loose stools, foul odor, 2-3x a day, denied abd pain, X-ray KUB unremarkable, afebrile, no N/V, in usual state of health.  Will continue prn Imodium, start Florastor bid, Questran 4gm bid, obtain C-diff toxin A/B, CBC/diff, CMP/eGFR, TSH ? AR of Depakote.   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   Labs/tests ordered:  C-diff toxin A/B, CBC/diff, CMP/eGFR, TSH  Time spend 35 minutes.

## 2023-05-15 NOTE — Progress Notes (Signed)
Location:  [facility] Room Number: 110A Place of Service:  SNF (31) Provider:  ,  X, NP  Patient Care Team: ,  X, NP as PCP - General (Internal Medicine) Quintella Reichert, MD as PCP - Cardiology (Cardiology) Renford Dills, MD (Internal Medicine)  Extended Emergency Contact Information Primary Emergency Contact: Lortz,[name] Address: [address]          [place], [place] [address] [place] Home Phone: [phone] Mobile Phone: [phone] Relation: Spouse Secondary Emergency Contact: Aramburo,[name] Mobile Phone: [phone] Relation: Son  Code Status: DNR Goals of care: Advanced Directive information    05/15/2023   10:51 AM  Advanced Directives  Does Patient Have a Medical Advance Directive? Yes  Type of Estate agent of [place];Living will;Out of facility DNR (pink MOST or yellow form)  Does patient want to make changes to medical advance directive? No - Patient declined  Copy of Healthcare Power of Attorney in Chart? Yes - validated most recent copy scanned in chart (See row information)  Pre-existing out of facility DNR order (yellow form or pink MOST form) Pink MOST form placed in chart (order not valid for inpatient use);Yellow form placed in chart (order not valid for inpatient use)     Chief Complaint  Patient presents with   Medical agement of Chronic Issues    Routine visit   Immunizations    Covid and Influenza vaccine.   Quality Metric Gaps    Eye exam.    HPI:  Pt is a 89 y.o. male seen today for medical management of chronic diseases.     Past Medical History:  Diagnosis Date   Allergic rhinitis, cause unspecified    BPH (benign prostatic hyperplasia)    Carotid artery stenosis, asymptomatic    1-39% by dopplers 09/2016 followed by Dr. Darrick Penna   Cataracts, bilateral    Chronic kidney disease (CKD), stage III (moderate) (HCC)    Complete heart block (HCC) 01/23/04   s/p  Medtronic PPM implanted by Dr Amil Amen   COPD (chronic obstructive pulmonary disease) (HCC)    Diabetes mellitus    Glaucoma    Hyperlipidemia    Hypertension    Insomnia due to medical condition 08/10/2014   Kidney stones    PAF (paroxysmal atrial fibrillation) (HCC) 10/09/2014   Noted on pacer check with 88 mode switches and longest episode >1 hour.  Now on Apixiban for CHADS2VASC score of 5   Past Surgical History:  Procedure Laterality Date   PACEMAKER INSERTION  2005, 04/16/12   MDT implanted by Dr Amil Amen with generator chnage (MDT Adapta L) by Dr Johney Frame 04/16/12   PERMANENT PACEMAKER GENERATOR CHANGE N/A 04/16/2012   Procedure: PERMANENT PACEMAKER GENERATOR CHANGE;  Surgeon: Hillis Range, MD;  Location: [facility] CATH LAB;  Service: Cardiovascular;  Laterality: N/A;   RIGHT/LEFT HEART CATH AND CORONARY ANGIOGRAPHY N/A 10/18/2020   Procedure: RIGHT/LEFT HEART CATH AND CORONARY ANGIOGRAPHY;  Surgeon: Lennette Bihari, MD;  Location: [facility] INVASIVE CV LAB;  Service: Cardiovascular;  Laterality: N/A;    Allergies  Allergen Reactions   Penicillins Rash and Other (See Comments)    Childhood allergy    Irbesartan     Per [facility]   Penicillin G Benzathine Rash    Outpatient Encounter Medications as of 05/15/2023  Medication Sig   acetaminophen (TYLENOL) 325 MG tablet Take 2 tablets (650 mg total) by mouth every 6 (six) hours as needed for mild pain or headache.   dextromethorphan-guaiFENesin (  ROBITUSSIN-DM) 10-100 MG/5ML liquid Take 10 mLs by mouth every 6 (six) hours as needed for cough.   divalproex (DEPAKOTE SPRINKLE) 125 MG capsule Take 250 mg by mouth 3 (three) times daily.   escitalopram (LEXAPRO) 10 MG tablet Take 10 mg by mouth daily.   ipratropium-albuterol (DUONEB) 0.5-2.5 (3) MG/3ML SOLN Take 3 mLs by nebulization every 8 (eight) hours as needed.   levothyroxine (SYNTHROID) 75 MCG tablet Take 75 mcg by mouth daily before breakfast.   LORazepam (ATIVAN) 1 MG tablet Take 1 mg by mouth 2 (two)  times a week. Give 1 mg by mouth  one time a day every Monday and Thursday.   nitroGLYCERIN (NITROSTAT) 0.4 MG SL tablet Place 1 tablet (0.4 mg total) under the tongue every 5 (five) minutes as needed for chest pain.   senna (SENOKOT) 8.6 MG TABS tablet Take 2 tablets by mouth daily as needed for mild constipation. Give 2 tablet by mouth at bedtime for constipation   [DISCONTINUED] clotrimazole (LOTRIMIN) 1 % cream Apply 1 Application topically 2 (two) times daily. Apply to Right Great Toenail topically two times a day for toenail fungus (Patient not taking: Reported on 04/11/2023)   [DISCONTINUED] Docusate Sodium (DOCU LIQUID PO) Take 10 mLs by mouth 2 (two) times daily.   [DISCONTINUED] magnesium sulfate (EPSOM SALT) GRAN Apply to Right foot topically one time a day for nail infection soak R foot/great toe daily x 15 minutes until healed. (Patient not taking: Reported on 04/11/2023)   [DISCONTINUED] mupirocin ointment (BACTROBAN) 2 % Apply 1 Application topically daily. Apply to Right Great Toe  for nail infection daily until healed, apply to nail base (Patient not taking: Reported on 04/11/2023)   No facility-administered encounter medications on file as of 05/15/2023.    Review of Systems  Immunization History  Administered Date(s) Administered   Influenza-Unspecified 07/04/2014, 08/05/2022   Moderna Covid-19 Vaccine Bivalent Booster 61yrs & up 07/18/2022   Moderna SARS-COV2 Booster Vaccination 07/25/2020, 02/13/2021   Moderna Sars-Covid-2 Vaccination 09/20/2019, 10/18/2019   PNEUMOCOCCAL CONJUGATE-20 08/07/2022   Pfizer Covid-19 Vaccine Bivalent Booster 39yrs & up 06/20/2021   Respiratory Syncytial Virus Vaccine,Recomb Aduvanted(Arexvy) 10/04/2022   Tdap 10/28/2022   Zoster Recombinant(Shingrix) 11/06/2017, 03/28/2018   Pertinent  Health Maintenance Due  Topic Date Due   OPHTHALMOLOGY EXAM  Never done   INFLUENZA VACCINE  04/17/2023   HEMOGLOBIN A1C  07/20/2023   FOOT EXAM  10/19/2023       07/05/2022    4:50 PM 10/18/2022   11:21 AM 11/04/2022    2:33 PM 11/15/2022    8:54 AM 12/26/2022   11:29 AM  Fall Risk  Falls in the past year? 0 0 0 0 0  Was there an injury with Fall? 0 0 0 0 0  Fall Risk Category Calculator 0 0 0 0 0  Fall Risk Category (Retired) Low      (RETIRED) Patient Fall Risk Level High fall risk      Patient at Risk for Falls Due to History of fall(s) No Fall Risks  No Fall Risks No Fall Risks  Fall risk Follow up Falls evaluation completed Falls evaluation completed Falls evaluation completed Falls evaluation completed Falls evaluation completed   Functional Status Survey:    Vitals:   05/15/23 1042  BP: (!) 144/72  Pulse: 65  Resp: (!) 21  Temp: (!) 97.3 F (36.3 C)  SpO2: 94%  Weight: 145 lb 6.4 oz (66 kg)  Height: 5\' 6"  (1.676 m)   Body  mass index is 23.47 kg/m. Physical Exam  Labs reviewed: Recent Labs    06/29/22 2006 07/01/22 0238 07/02/22 0242 08/13/22 0000 01/04/23 0000  NA 143 140 142 144 140  K 4.5 3.9 3.6 3.8 3.9  CL 111 108 108 109* 106  CO2 21* 21* 25 27* 21  GLUCOSE 209* 199* 112*  --   --   BUN 22 34* 27* 22* 48*  CREATININE 1.67* 1.76* 1.65* 1.4* 1.7*  CALCIUM 8.8* 8.5* 8.8* 8.9 8.9   Recent Labs    06/29/22 1545 08/13/22 0000  AST 25 15  ALT 29 19  ALKPHOS 77 80  BILITOT 0.5  --   PROT 6.2*  --   ALBUMIN 3.6 4.0   Recent Labs    06/29/22 1545 06/29/22 1605 07/01/22 0238 07/02/22 0242 08/13/22 0000 01/04/23 0000  WBC 6.4  --  6.4 4.8 5.5 7.0  NEUTROABS 4.8  --   --   --  2,690.00  --   HGB 14.4   < > 14.3 14.9 14.0 11.7*  HCT 44.5   < > 43.3 45.7 44 35*  MCV 82.9  --  81.5 81.5  --   --   PLT 222  --  213 229 246 168   < > = values in this interval not displayed.   No results found for: "TSH" Lab Results  Component Value Date   HGBA1C 7.7 01/17/2023   Lab Results  Component Value Date   CHOL 128 12/16/2016   HDL 55 12/16/2016   LDLCALC 58 12/16/2016   TRIG 97 06/29/2022   CHOLHDL  2.3 12/16/2016    Significant Diagnostic Results in last 30 days:  No results found.  Assessment/Plan There are no diagnoses linked to this encounter.   Family/ staff Communication: ***  Labs/tests ordered:  ***

## 2023-05-16 ENCOUNTER — Encounter: Payer: Self-pay | Admitting: Nurse Practitioner

## 2023-05-16 DIAGNOSIS — R197 Diarrhea, unspecified: Secondary | ICD-10-CM | POA: Diagnosis not present

## 2023-05-16 LAB — CBC AND DIFFERENTIAL
HCT: 50 (ref 41–53)
Hemoglobin: 16.1 (ref 13.5–17.5)
Neutrophils Absolute: 5885
Platelets: 286 10*3/uL (ref 150–400)
WBC: 9.6

## 2023-05-16 LAB — BASIC METABOLIC PANEL
BUN: 23 — AB (ref 4–21)
CO2: 24 — AB (ref 13–22)
Chloride: 107 (ref 99–108)
Creatinine: 1.5 — AB (ref 0.6–1.3)
Glucose: 142
Potassium: 3.5 meq/L (ref 3.5–5.1)
Sodium: 143 (ref 137–147)

## 2023-05-16 LAB — CBC: RBC: 5.83 — AB (ref 3.87–5.11)

## 2023-05-16 LAB — HEPATIC FUNCTION PANEL
ALT: 14 U/L (ref 10–40)
AST: 9 — AB (ref 14–40)
Alkaline Phosphatase: 67 (ref 25–125)
Bilirubin, Total: 0.5

## 2023-05-16 LAB — COMPREHENSIVE METABOLIC PANEL
Albumin: 3.3 — AB (ref 3.5–5.0)
Calcium: 8.5 — AB (ref 8.7–10.7)
Globulin: 2.2

## 2023-05-16 NOTE — Assessment & Plan Note (Signed)
taking Levothyroxine, TSH 2.82 06/13/22 

## 2023-05-16 NOTE — Assessment & Plan Note (Signed)
S/p PPM 

## 2023-05-16 NOTE — Assessment & Plan Note (Addendum)
on Lexapro, Depakote, Lorazepam,  stabilizing mood

## 2023-05-16 NOTE — Assessment & Plan Note (Signed)
followed by Cardiology, heart rate is in control, off  Eliquis, Hgb 11.7 01/04/23 

## 2023-05-16 NOTE — Assessment & Plan Note (Signed)
off  Donepezil, Memantine, on Depakote, followed by Neurology, Hospice for supportive care, MMSE 2/30 06/07/22 

## 2023-05-16 NOTE — Assessment & Plan Note (Signed)
on diet, off FArxiga, Hgb a1c 7.7 01/16/23 

## 2023-05-16 NOTE — Assessment & Plan Note (Signed)
compensated 

## 2023-05-16 NOTE — Assessment & Plan Note (Signed)
The patient was noted having loose stools, foul odor, 2-3x a day, denied abd pain, X-ray KUB unremarkable, afebrile, no N/V, in usual state of health.  Will continue prn Imodium, start Florastor bid, Questran 4gm bid, obtain C-diff toxin A/B, CBC/diff, CMP/eGFR, TSH ? AR of Depakote.

## 2023-05-16 NOTE — Assessment & Plan Note (Signed)
Controlled blood pressure with Sbp 140s, no meds

## 2023-06-18 ENCOUNTER — Non-Acute Institutional Stay (SKILLED_NURSING_FACILITY): Payer: PPO | Admitting: Nurse Practitioner

## 2023-06-18 ENCOUNTER — Encounter: Payer: Self-pay | Admitting: Nurse Practitioner

## 2023-06-18 DIAGNOSIS — U071 COVID-19: Secondary | ICD-10-CM | POA: Diagnosis not present

## 2023-06-18 DIAGNOSIS — I480 Paroxysmal atrial fibrillation: Secondary | ICD-10-CM | POA: Diagnosis not present

## 2023-06-18 DIAGNOSIS — F0394 Unspecified dementia, unspecified severity, with anxiety: Secondary | ICD-10-CM

## 2023-06-18 DIAGNOSIS — I5020 Unspecified systolic (congestive) heart failure: Secondary | ICD-10-CM

## 2023-06-18 DIAGNOSIS — E0849 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: Secondary | ICD-10-CM

## 2023-06-18 DIAGNOSIS — R413 Other amnesia: Secondary | ICD-10-CM

## 2023-06-18 DIAGNOSIS — E039 Hypothyroidism, unspecified: Secondary | ICD-10-CM

## 2023-06-18 NOTE — Assessment & Plan Note (Signed)
Compensated clinically, off Farxiga, EF 30-35%

## 2023-06-18 NOTE — Assessment & Plan Note (Signed)
on diet, off FArxiga, Hgb a1c 7.7 01/16/23 

## 2023-06-18 NOTE — Assessment & Plan Note (Signed)
taking Levothyroxine, TSH 2.82 06/13/22 

## 2023-06-18 NOTE — Assessment & Plan Note (Signed)
heart rate is in control, off  Eliquis, Hgb 11.7 01/04/23

## 2023-06-18 NOTE — Assessment & Plan Note (Signed)
Bun/creat 48/1.7 01/04/23

## 2023-06-18 NOTE — Assessment & Plan Note (Signed)
resolved chills 06/17/23, resolved today, tested COVID positive, desired Molnupiravir 800mg  q12hr x 5 days. The patient is afebrile, running nose as usual, denied sore throat, no noted cough or O2 desaturation.

## 2023-06-18 NOTE — Assessment & Plan Note (Signed)
off  Donepezil, Memantine, on Depakote, followed by Neurology, Hospice for supportive care, MMSE 2/30 06/07/22 

## 2023-06-18 NOTE — Progress Notes (Signed)
Location:   [facility] Room Number: 110 Place of Service:  SNF (31) Provider: Arna Snipe Javon Hupfer NP  Aleta Manternach X, NP  Patient Care Team: Azavier Creson X, NP as PCP - General (Internal Medicine) Quintella Reichert, MD as PCP - Cardiology (Cardiology) Renford Dills, MD (Internal Medicine)  Extended Emergency Contact Information Primary Emergency Contact: Waddle,[name] Address: [address]          [place], [place] [address] [place] Home Phone: [phone] Mobile Phone: [phone] Relation: Spouse Secondary Emergency Contact: Winship,[name] Mobile Phone: [phone] Relation: Son  Code Status: DNR Goals of care: Advanced Directive information    05/15/2023   10:51 AM  Advanced Directives  Does Patient Have a Medical Advance Directive? Yes  Type of Estate agent of [place];Living will;Out of facility DNR (pink MOST or yellow form)  Does patient want to make changes to medical advance directive? No - Patient declined  Copy of Healthcare Power of Attorney in Chart? Yes - validated most recent copy scanned in chart (See row information)  Pre-existing out of facility DNR order (yellow form or pink MOST form) Pink MOST form placed in chart (order not valid for inpatient use);Yellow form placed in chart (order not valid for inpatient use)     Chief Complaint  Patient presents with  . Acute Visit    Chills, tested positive for COVID    HPI:  Pt is a 89 y.o. male seen today for an acute visit for resolved chills 06/17/23, resolved today, tested COVID positive, desired Molnupiravir 800mg  q12hr x 5 days. The patient is afebrile, running nose as usual, denied sore throat, no noted cough or O2 desaturation.     Dysphagia III diet Senile dementia,  off  Donepezil, Memantine, on Depakote, followed by Neurology, Hospice for supportive care, MMSE 2/30 06/07/22 Depression/anxiety, on Lexapro, Depakote, Lorazepam,  stabilizing mood             Adult  Failure To Thrive, weight loss-stable, followed by dietary             Afib followed by Cardiology, heart rate is in control, off  Eliquis, Hgb 11.7 01/04/23             CAD, off Statin, on prn NTG             CHFrEF, off Farxiga, EF 30-35%             CHB s/p PPM             T2DM, on diet, off FArxiga, Hgb a1c 7.7 01/16/23             Hypothyroidism, taking Levothyroxine, TSH 2.82 06/13/22             Hyperlipidemia, off  Atorvastatin, LDL 60 06/13/22             HTN, off Valsartan             CKD chronic, Bun/creat 48/1.7 01/04/23    Past Medical History:  Diagnosis Date  . Allergic rhinitis, cause unspecified   . BPH (benign prostatic hyperplasia)   . Carotid artery stenosis, asymptomatic    1-39% by dopplers 09/2016 followed by Dr. Darrick Penna  . Cataracts, bilateral   . Chronic kidney disease (CKD), stage III (moderate) (HCC)   . Complete heart block (HCC) 01/23/04   s/p Medtronic PPM implanted by Dr Amil Amen  . COPD (chronic obstructive pulmonary disease) (HCC)   . Diabetes mellitus   .  Glaucoma   . Hyperlipidemia   . Hypertension   . 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 Kidney stones   . PAF (paroxysmal atrial fibrillation) (HCC) 10/09/2014   Noted on pacer check with 88 mode switches and longest episode >1 hour.  Now on Apixiban for CHADS2VASC score of 5   Past Surgical History:  Procedure Laterality Date  . PACEMAKER INSERTION  2005, 04/16/12   MDT implanted by Dr Amil Amen with generator chnage (MDT Adapta L) by Dr Johney Frame 04/16/12  . PERMANENT PACEMAKER GENERATOR CHANGE N/A 04/16/2012   Procedure: PERMANENT PACEMAKER GENERATOR CHANGE;  Surgeon: Hillis Range, MD;  Location: [facility] CATH LAB;  Service: Cardiovascular;  Laterality: N/A;  . RIGHT/LEFT HEART CATH AND CORONARY ANGIOGRAPHY N/A 10/18/2020   Procedure: RIGHT/LEFT HEART CATH AND CORONARY ANGIOGRAPHY;  Surgeon: Lennette Bihari, MD;  Location: [facility] INVASIVE CV LAB;  Service: Cardiovascular;  Laterality: N/A;    Allergies  Allergen  Reactions  . Penicillins Rash and Other (See Comments)    Childhood allergy   . Irbesartan     Per [facility]  . Penicillin G Benzathine Rash    Allergies as of 06/18/2023       Reactions   Penicillins Rash, Other (See Comments)   Childhood allergy    Irbesartan    Per [facility]   Penicillin G Benzathine Rash        Medication List        Accurate as of June 18, 2023  4:25 PM. If you have any questions, ask your nurse or doctor.          acetaminophen 325 MG tablet Commonly known as: TYLENOL Take 2 tablets (650 mg total) by mouth every 6 (six) hours as needed for mild pain or headache.   dextromethorphan-guaiFENesin 10-100 MG/5ML liquid Commonly known as: ROBITUSSIN-DM Take 10 mLs by mouth every 6 (six) hours as needed for cough.   divalproex 125 MG capsule Commonly known as: DEPAKOTE SPRINKLE Take 250 mg by mouth 3 (three) times daily.   escitalopram 10 MG tablet Commonly known as: LEXAPRO Take 10 mg by mouth daily.   ipratropium-albuterol 0.5-2.5 (3) MG/3ML Soln Commonly known as: DUONEB Take 3 mLs by nebulization every 8 (eight) hours as needed.   levothyroxine 75 MCG tablet Commonly known as: SYNTHROID Take 75 mcg by mouth daily before breakfast.   LORazepam 1 MG tablet Commonly known as: ATIVAN Take 1 mg by mouth 2 (two) times a week. Give 1 mg by mouth  one time a day every Monday and Thursday.   nitroGLYCERIN 0.4 MG SL tablet Commonly known as: NITROSTAT Place 1 tablet (0.4 mg total) under the tongue every 5 (five) minutes as needed for chest pain.   senna 8.6 MG Tabs tablet Commonly known as: SENOKOT Take 2 tablets by mouth daily as needed for mild constipation. Give 2 tablet by mouth at bedtime for constipation        Review of Systems  Unable to perform ROS: Dementia    Immunization History  Administered Date(s) Administered  . Influenza-Unspecified 07/04/2014, 08/05/2022  . Moderna Covid-19 Vaccine Bivalent Booster 96yrs & up 07/18/2022  .  Moderna SARS-COV2 Booster Vaccination 07/25/2020, 02/13/2021  . Moderna Sars-Covid-2 Vaccination 09/20/2019, 10/18/2019  . PNEUMOCOCCAL CONJUGATE-20 08/07/2022  . 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 Respiratory Syncytial Virus Vaccine,Recomb Aduvanted(Arexvy) 10/04/2022  . Tdap 10/28/2022  . Zoster Recombinant(Shingrix) 11/06/2017, 03/28/2018   Pertinent  Health Maintenance Due  Topic Date Due  .  OPHTHALMOLOGY EXAM  Never done  . INFLUENZA VACCINE  04/17/2023  . HEMOGLOBIN A1C  07/20/2023  . FOOT EXAM  10/19/2023      07/05/2022    4:50 PM 10/18/2022   11:21 AM 11/04/2022    2:33 PM 11/15/2022    8:54 AM 12/26/2022   11:29 AM  Fall Risk  Falls in the past year? 0 0 0 0 0  Was there an injury with Fall? 0 0 0 0 0  Fall Risk Category Calculator 0 0 0 0 0  Fall Risk Category (Retired) Low      (RETIRED) Patient Fall Risk Level High fall risk      Patient at Risk for Falls Due to History of fall(s) No Fall Risks  No Fall Risks No Fall Risks  Fall risk Follow up Falls evaluation completed Falls evaluation completed Falls evaluation completed Falls evaluation completed Falls evaluation completed   Functional Status Survey:    Vitals:   06/18/23 1425  BP: 129/80  Pulse: 72  Resp: 16  Temp: (!) 96.5 F (35.8 C)  SpO2: 95%   There is no height or weight on file to calculate BMI. Physical Exam Vitals and nursing note reviewed.  Constitutional:      Appearance: Normal appearance.  HENT:     Head: Normocephalic and atraumatic.     Nose: Nose normal.     Comments: Clear nasal drainage.     Mouth/Throat:     Mouth: Mucous membranes are moist.     Comments: Unable to examine throat Eyes:     Extraocular Movements: Extraocular movements intact.     Conjunctiva/sclera: Conjunctivae normal.     Pupils: Pupils are equal, round, and reactive to light.  Cardiovascular:     Rate and Rhythm: Normal rate and regular rhythm.     Heart sounds: No murmur  heard. Pulmonary:     Effort: Pulmonary effort is normal.     Breath sounds: No wheezing, rhonchi or rales.  Abdominal:     General: Bowel sounds are normal.     Palpations: Abdomen is soft.     Tenderness: There is no abdominal tenderness.     Comments: Loose stools 2-3x/day  Genitourinary:    Comments: External hemorrhoids from previous examination.  Musculoskeletal:     Cervical back: Normal range of motion and neck supple.     Right lower leg: No edema.     Left lower leg: No edema.  Skin:    General: Skin is warm and dry.  Neurological:     General: No focal deficit present.     Mental Status: He is alert. Mental status is at baseline.     Gait: Gait abnormal.     Comments: Oriented to person  Psychiatric:     Comments: Flat affect, non cooperative.     Labs reviewed: Recent Labs    06/29/22 2006 07/01/22 0238 07/02/22 0242 08/13/22 0000 01/04/23 0000  NA 143 140 142 144 140  K 4.5 3.9 3.6 3.8 3.9  CL 111 108 108 109* 106  CO2 21* 21* 25 27* 21  GLUCOSE 209* 199* 112*  --   --   BUN 22 34* 27* 22* 48*  CREATININE 1.67* 1.76* 1.65* 1.4* 1.7*  CALCIUM 8.8* 8.5* 8.8* 8.9 8.9   Recent Labs    06/29/22 1545 08/13/22 0000  AST 25 15  ALT 29 19  ALKPHOS 77 80  BILITOT 0.5  --   PROT 6.2*  --  ALBUMIN 3.6 4.0   Recent Labs    06/29/22 1545 06/29/22 1605 07/01/22 0238 07/02/22 0242 08/13/22 0000 01/04/23 0000  WBC 6.4  --  6.4 4.8 5.5 7.0  NEUTROABS 4.8  --   --   --  2,690.00  --   HGB 14.4   < > 14.3 14.9 14.0 11.7*  HCT 44.5   < > 43.3 45.7 44 35*  MCV 82.9  --  81.5 81.5  --   --   PLT 222  --  213 229 246 168   < > = values in this interval not displayed.   No results found for: "TSH" Lab Results  Component Value Date   HGBA1C 7.7 01/17/2023   Lab Results  Component Value Date   CHOL 128 12/16/2016   HDL 55 12/16/2016   LDLCALC 58 12/16/2016   TRIG 97 06/29/2022   CHOLHDL 2.3 12/16/2016    Significant Diagnostic Results in last 30  days:  No results found.  Assessment/Plan: COVID-19 virus infection resolved chills 06/17/23, resolved today, tested COVID positive, desired Molnupiravir 800mg  q12hr x 5 days. The patient is afebrile, running nose as usual, denied sore throat, no noted cough or O2 desaturation.    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   Labs/tests ordered:  none  Time spend 35 minutes.

## 2023-06-18 NOTE — Assessment & Plan Note (Signed)
on Lexapro, Depakote, Lorazepam,  stabilizing mood

## 2023-07-09 ENCOUNTER — Non-Acute Institutional Stay (INDEPENDENT_AMBULATORY_CARE_PROVIDER_SITE_OTHER): Payer: PPO | Admitting: Nurse Practitioner

## 2023-07-09 ENCOUNTER — Encounter: Payer: Self-pay | Admitting: Nurse Practitioner

## 2023-07-09 DIAGNOSIS — Z0000 Encounter for general adult medical examination without abnormal findings: Secondary | ICD-10-CM

## 2023-07-09 NOTE — Progress Notes (Unsigned)
Subjective:   MILEY WAMBOLDT is a 89 y.o. male who presents for Medicare Annual/Subsequent preventive examination.  Visit Complete: In person  Patient Medicare AWV questionnaire was completed by the patient on 07/09/23; I have confirmed that all information answered by patient is correct and no changes since this date.  Cardiac Risk Factors include: advanced age (>33men, >21 women);dyslipidemia;hypertension;male gender     Objective:    Today's Vitals   07/09/23 1506  BP: (!) 146/68  Pulse: 64  Resp: 18  Temp: 97.7 F (36.5 C)  SpO2: 95%  Weight: 138 lb 9.6 oz (62.9 kg)  Height: 5\' 6"  (1.676 m)  PainSc: 0-No pain   Body mass index is 22.37 kg/m.     07/09/2023    3:22 PM 05/15/2023   10:51 AM 04/11/2023   10:12 AM 02/11/2023    2:16 PM 01/31/2023    9:23 AM 01/14/2023    2:52 PM 01/09/2023   10:57 AM  Advanced Directives  Does Patient Have a Medical Advance Directive? Yes Yes Yes Yes Yes Yes Yes  Type of Estate agent of [place];Living will;Out of facility DNR (pink MOST or yellow form) Healthcare Power of [place];Living will;Out of facility DNR (pink MOST or yellow form) Healthcare Power of [place];Living will;Out of facility DNR (pink MOST or yellow form) Healthcare Power of [place];Living will;Out of facility DNR (pink MOST or yellow form) Healthcare Power of [place];Living will;Out of facility DNR (pink MOST or yellow form) Healthcare Power of [place];Living will;Out of facility DNR (pink MOST or yellow form) Healthcare Power of [place];Living will;Out of facility DNR (pink MOST or yellow form)  Does patient want to make changes to medical advance directive? No - Patient declined No - Patient declined No - Patient declined No - Patient declined No - Patient declined No - Patient declined No - Patient declined  Copy of Healthcare Power of Attorney in Chart? Yes - validated most recent copy scanned in chart (See row information) Yes - validated  most recent copy scanned in chart (See row information) Yes - validated most recent copy scanned in chart (See row information) Yes - validated most recent copy scanned in chart (See row information) Yes - validated most recent copy scanned in chart (See row information) Yes - validated most recent copy scanned in chart (See row information) Yes - validated most recent copy scanned in chart (See row information)  Pre-existing out of facility DNR order (yellow form or pink MOST form) Pink MOST form placed in chart (order not valid for inpatient use);Yellow form placed in chart (order not valid for inpatient use) Pink MOST form placed in chart (order not valid for inpatient use);Yellow form placed in chart (order not valid for inpatient use) Pink MOST form placed in chart (order not valid for inpatient use);Yellow form placed in chart (order not valid for inpatient use) Pink MOST form placed in chart (order not valid for inpatient use);Yellow form placed in chart (order not valid for inpatient use) Pink MOST/Yellow Form most recent copy in chart - Physician notified to receive inpatient order Pink MOST/Yellow Form most recent copy in chart - Physician notified to receive inpatient order Pink MOST/Yellow Form most recent copy in chart - Physician notified to receive inpatient order    Current Medications (verified) Outpatient Encounter Medications as of 07/09/2023  Medication Sig   acetaminophen (TYLENOL) 325 MG tablet Take 2 tablets (650 mg total) by mouth every 6 (six) hours as needed for mild pain or headache.  cholestyramine (QUESTRAN) 4 g packet Take 4 g by mouth 3 (three) times daily with meals.   dextromethorphan-guaiFENesin (ROBITUSSIN-DM) 10-100 MG/5ML liquid Take 10 mLs by mouth every 6 (six) hours as needed for cough.   divalproex (DEPAKOTE SPRINKLE) 125 MG capsule Take 250 mg by mouth 3 (three) times daily.   escitalopram (LEXAPRO) 10 MG tablet Take 10 mg by mouth daily.   ipratropium-albuterol  (DUONEB) 0.5-2.5 (3) MG/3ML SOLN Take 3 mLs by nebulization every 8 (eight) hours as needed.   levothyroxine (SYNTHROID) 75 MCG tablet Take 75 mcg by mouth daily before breakfast.   LORazepam (ATIVAN) 1 MG tablet Take 1 mg by mouth 2 (two) times a week. Give 1 mg by mouth  one time a day every Monday and Thursday.   nitroGLYCERIN (NITROSTAT) 0.4 MG SL tablet Place 1 tablet (0.4 mg total) under the tongue every 5 (five) minutes as needed for chest pain.   saccharomyces boulardii (FLORASTOR) 250 MG capsule Take 250 mg by mouth 2 (two) times daily.   senna (SENOKOT) 8.6 MG TABS tablet Take 2 tablets by mouth daily as needed for mild constipation. Give 2 tablet by mouth at bedtime for constipation   No facility-administered encounter medications on file as of 07/09/2023.    Allergies (verified) Penicillins, Irbesartan, and Penicillin g benzathine   History: Past Medical History:  Diagnosis Date   Allergic rhinitis, cause unspecified    BPH (benign prostatic hyperplasia)    Carotid artery stenosis, asymptomatic    1-39% by dopplers 09/2016 followed by Dr. Darrick Penna   Cataracts, bilateral    Chronic kidney disease (CKD), stage III (moderate) (HCC)    Complete heart block (HCC) 01/23/04   s/p Medtronic PPM implanted by Dr Amil Amen   COPD (chronic obstructive pulmonary disease) (HCC)    Diabetes mellitus    Glaucoma    Hyperlipidemia    Hypertension    Insomnia due to medical condition 08/10/2014   Kidney stones    PAF (paroxysmal atrial fibrillation) (HCC) 10/09/2014   Noted on pacer check with 88 mode switches and longest episode >1 hour.  Now on Apixiban for CHADS2VASC score of 5   Past Surgical History:  Procedure Laterality Date   PACEMAKER INSERTION  2005, 04/16/12   MDT implanted by Dr Amil Amen with generator chnage (MDT Adapta L) by Dr Johney Frame 04/16/12   PERMANENT PACEMAKER GENERATOR CHANGE N/A 04/16/2012   Procedure: PERMANENT PACEMAKER GENERATOR CHANGE;  Surgeon: Hillis Range, MD;  Location:  [facility] CATH LAB;  Service: Cardiovascular;  Laterality: N/A;   RIGHT/LEFT HEART CATH AND CORONARY ANGIOGRAPHY N/A 10/18/2020   Procedure: RIGHT/LEFT HEART CATH AND CORONARY ANGIOGRAPHY;  Surgeon: Lennette Bihari, MD;  Location: [facility] INVASIVE CV LAB;  Service: Cardiovascular;  Laterality: N/A;   Family History  Problem Relation Age of Onset   Heart disease Father    Heart attack Father    Alzheimer's disease Sister    Social History   Socioeconomic History   Marital status: Married    Spouse name: [name]   Number of children: 2   Years of education: BS   Highest education level: Not on file  Occupational History   Occupation: retired  Tobacco Use   Smoking status: Former    Current packs/day: 0.00    Average packs/day: 1 pack/day for 25.0 years (25.0 ttl pk-yrs)    Types: Cigarettes    Start date: 09/16/1973    Quit date: 09/16/1998    Years since quitting: 24.8   Smokeless tobacco: Never  Vaping Use   Vaping status: Never Used  Substance and Sexual Activity   Alcohol use: Not Currently    Comment: 1-2 a month glasses of wine   Drug use: No   Sexual activity: Not on file  Other Topics Concern   Not on file  Social History Narrative   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   Patient is married with 2 children.   Patient is right handed.   Patient has BS degree.   Patient drinks 1 cup daily.         Social Determinants of Health   Financial Resource Strain: Not on file  Food Insecurity: Not on file  Transportation Needs: Not on file  Physical Activity: Not on file  Stress: Not on file  Social Connections: Not on file    Tobacco Counseling Counseling given: Not Answered   Clinical Intake:  Pre-visit preparation completed: Yes  Pain : No/denies pain Pain Score: 0-No pain     BMI - recorded: 22.37 Nutritional Status: BMI of 19-24  Normal Nutritional Risks: None Diabetes: No  How often do you need to have someone help you when you read instructions,  pamphlets, or other written materials from your doctor or pharmacy?: 5 - Always What is the last grade level you completed in school?: PHD  Interpreter Needed?: No  Information entered by :: Maraki Macquarrie Nedra Hai NP   Activities of Daily Living    07/09/2023    3:21 PM  In your present state of health, do you have any difficulty performing the following activities:  Hearing? 1  Vision? 0  Difficulty concentrating or making decisions? 1  Walking or climbing stairs? 1  Dressing or bathing? 1  Doing errands, shopping? 1  Preparing Food and eating ? N  Using the Toilet? Y  In the past six months, have you accidently leaked urine? Y  Do you have problems with loss of bowel control? N  Managing your Medications? Y  Managing your Finances? Y  Housekeeping or managing your Housekeeping? Y    Patient Care Team: Xoey Warmoth X, NP as PCP - General (Internal Medicine) Quintella Reichert, MD as PCP - Cardiology (Cardiology) Renford Dills, MD (Internal Medicine)  Indicate any recent Medical Services you may have received from other than [facility] providers in the past year (date may be approximate).     Assessment:   This is a routine wellness examination for Ca[place].  Hearing/Vision screen No results found.   Goals Addressed             This Visit's Progress    Maintain Mobility and Function       Evidence-based guidance:  Emphasize the importance of physical activity and aerobic exercise as included in treatment plan; assess barriers to adherence; consider patient's abilities and preferences.  Encourage gradual increase in activity or exercise instead of stopping if pain occurs.  Reinforce individual therapy exercise prescription, such as strengthening, stabilization and stretching programs.  Promote optimal body mechanics to stabilize the spine with lifting and functional activity.  Encourage activity and mobility modifications to facilitate optimal function, such as using a log roll for  bed mobility or dressing from a seated position.  Reinforce individual adaptive equipment recommendations to limit excessive spinal movements, such as a Event organiser.  Assess adequacy of sleep; encourage use of sleep hygiene techniques, such as bedtime routine; use of white noise; dark, cool bedroom; avoiding daytime naps, heavy meals or exercise before bedtime.  Promote  positions and modification to optimize sleep and sexual activity; consider pillows or positioning devices to assist in maintaining neutral spine.  Explore options for applying ergonomic principles at work and home, such as frequent position changes, using ergonomically designed equipment and working at optimal height.  Promote modifications to increase comfort with driving such as lumbar support, optimizing seat and steering wheel position, using cruise control and taking frequent rest stops to stretch and walk.   Notes:        Depression Screen    12/26/2022   11:29 AM 11/15/2022    8:54 AM 07/05/2022    4:50 PM  PHQ 2/9 Scores  PHQ - 2 Score 0 0 0  Exception Documentation  Other- indicate reason in comment box     Fall Risk    12/26/2022   11:29 AM 11/15/2022    8:54 AM 11/04/2022    2:33 PM 10/18/2022   11:21 AM 07/05/2022    4:50 PM  Fall Risk   Falls in the past year? 0 0 0 0 0  Number falls in past yr: 0 0 0 0 0  Injury with Fall? 0 0 0 0 0  Risk for fall due to : No Fall Risks No Fall Risks  No Fall Risks History of fall(s)  Follow up Falls evaluation completed Falls evaluation completed Falls evaluation completed Falls evaluation completed Falls evaluation completed    MEDICARE RISK AT HOME: Medicare Risk at Home Any stairs in or around the home?: Yes If so, are there any without handrails?: No Home free of loose throw rugs in walkways, pet beds, electrical cords, etc?: Yes Adequate lighting in your home to reduce risk of falls?: Yes Life alert?: No Use of a cane, walker or w/c?: Yes Grab bars in  the bathroom?: Yes Shower chair or bench in shower?: Yes Elevated toilet seat or a handicapped toilet?: Yes  TIMED UP AND GO:  Was the test performed?  Yes  Length of time to ambulate 10 feet: 15 sec Gait unsteady with use of assistive device, provider informed and education provided.     Cognitive Function:    08/21/2021    3:58 PM 01/10/2021   11:11 AM 07/11/2020   12:28 PM 07/11/2020   10:39 AM 01/03/2020    2:29 PM  MMSE - Mini Mental State Exam  Not completed: Unable to complete Unable to complete  Unable to complete   Orientation to time  1 0 1 2  Orientation to Place  1 1 1 2   Registration  2 3  3   Attention/ Calculation  0 0  4  Recall  0 0  0  Language- name 2 objects  0 2  2  Language- repeat   0  0  Language- follow 3 step command   1  2  Language- read & follow direction   1  1  Write a sentence   0  1  Copy design   0  0  Copy design-comments     3 animals  Total score   8  17      10/19/2015    2:34 PM  Montreal Cognitive Assessment   Visuospatial/ Executive (0/5) 5  Naming (0/3) 3  Attention: Read list of digits (0/2) 2  Attention: Read list of letters (0/1) 0  Attention: Serial 7 subtraction starting at 100 (0/3) 3  Language: Repeat phrase (0/2) 2  Language : Fluency (0/1) 1  Abstraction (0/2) 2  Delayed Recall (0/5)  2  Orientation (0/6) 6  Total 26  Adjusted Score (based on education) 26      Immunizations Immunization History  Administered Date(s) Administered   Influenza-Unspecified 07/04/2014, 08/05/2022   Moderna Covid-19 Vaccine Bivalent Booster 8yrs & up 07/18/2022   Moderna SARS-COV2 Booster Vaccination 07/25/2020, 02/13/2021   Moderna Sars-Covid-2 Vaccination 09/20/2019, 10/18/2019   PNEUMOCOCCAL CONJUGATE-20 08/07/2022   Pfizer Covid-19 Vaccine Bivalent Booster 81yrs & up 06/20/2021   Respiratory Syncytial Virus Vaccine,Recomb Aduvanted(Arexvy) 10/04/2022   Tdap 10/28/2022   Zoster Recombinant(Shingrix) 11/06/2017, 03/28/2018     TDAP status: Up to date  Flu Vaccine status: Up to date  Pneumococcal vaccine status: Up to date  Covid-19 vaccine status: Completed vaccines  Qualifies for Shingles Vaccine? Yes   Zostavax completed Yes   Shingrix Completed?: Yes  Screening Tests Health Maintenance  Topic Date Due   OPHTHALMOLOGY EXAM  Never done   INFLUENZA VACCINE  04/17/2023   COVID-19 Vaccine (7 - 2023-24 season) 05/18/2023   HEMOGLOBIN A1C  07/20/2023   FOOT EXAM  10/19/2023   DTaP/Tdap/Td (2 - Td or Tdap) 10/28/2032   Pneumonia Vaccine 62+ Years old  Completed   Zoster Vaccines- Shingrix  Completed   HPV VACCINES  Aged Out    Health Maintenance  Health Maintenance Due  Topic Date Due   OPHTHALMOLOGY EXAM  Never done   INFLUENZA VACCINE  04/17/2023   COVID-19 Vaccine (7 - 2023-24 season) 05/18/2023    Colorectal cancer screening: No longer required.   Lung Cancer Screening: (Low Dose CT Chest recommended if Age 50-80 years, 20 pack-year currently smoking OR have quit w/in 15years.) does not qualify.     Additional Screening:  Hepatitis C Screening: does not qualify;   Vision Screening: Recommended annual ophthalmology exams for early detection of glaucoma and other disorders of the eye. Is the patient up to date with their annual eye exam?  No  Who is the provider or what is the name of the office in which the patient attends annual eye exams? [facility] will provide If pt is not established with a provider, would they like to be referred to a provider to establish care? No .   Dental Screening: Recommended annual dental exams for proper oral hygiene  Diabetic Foot Exam: Diabetic Foot Exam: Completed 10/18/22  Community Resource Referral / Chronic Care Management: CRR required this visit?  No   CCM required this visit?  No     Plan:     I have personally reviewed and noted the following in the patient's chart:   Medical and social history Use of alcohol, tobacco or illicit drugs   Current medications and supplements including opioid prescriptions. Patient is not currently taking opioid prescriptions. Functional ability and status Nutritional status Physical activity Advanced directives List of other physicians Hospitalizations, surgeries, and ER visits in previous 12 months Vitals Screenings to include cognitive, depression, and falls Referrals and appointments  In addition, I have reviewed and discussed with patient certain preventive protocols, quality metrics, and best practice recommendations. A written personalized care plan for preventive services as well as general preventive health recommendations were provided to patient.     Paul Trettin X Nakita Santerre, NP   07/10/2023   After Visit Summary: (In Person-Declined) Patient declined AVS at this time.

## 2023-07-11 ENCOUNTER — Encounter: Payer: Self-pay | Admitting: Sports Medicine

## 2023-07-11 ENCOUNTER — Non-Acute Institutional Stay (SKILLED_NURSING_FACILITY): Payer: PPO | Admitting: Sports Medicine

## 2023-07-11 DIAGNOSIS — Z515 Encounter for palliative care: Secondary | ICD-10-CM | POA: Diagnosis not present

## 2023-07-11 DIAGNOSIS — E039 Hypothyroidism, unspecified: Secondary | ICD-10-CM | POA: Diagnosis not present

## 2023-07-11 DIAGNOSIS — K5900 Constipation, unspecified: Secondary | ICD-10-CM

## 2023-07-11 DIAGNOSIS — F0394 Unspecified dementia, unspecified severity, with anxiety: Secondary | ICD-10-CM | POA: Diagnosis not present

## 2023-07-11 DIAGNOSIS — F02818 Dementia in other diseases classified elsewhere, unspecified severity, with other behavioral disturbance: Secondary | ICD-10-CM

## 2023-07-11 NOTE — Progress Notes (Signed)
Provider:  Andree Coss Location:   [facility]   Place of Service:   Memory care   PCP: Mast, Man X, NP Patient Care Team: Mast, Man X, NP as PCP - General (Internal Medicine) Quintella Reichert, MD as PCP - Cardiology (Cardiology) Renford Dills, MD (Internal Medicine)  Extended Emergency Contact Information Primary Emergency Contact: [facility] Address: [address]          [place], [place] [address] Darden Amber of [place] Home Phone: [phone] Mobile Phone: [phone] Relation: Spouse Secondary Emergency Contact: Caruth,[name] Mobile Phone: [phone] Relation: Son  Code Status:  Goals of Care: Advanced Directive information    07/09/2023    3:22 PM  Advanced Directives  Does Patient Have a Medical Advance Directive? Yes  Type of Estate agent of [place];Living will;Out of facility DNR (pink MOST or yellow form)  Does patient want to make changes to medical advance directive? No - Patient declined  Copy of Healthcare Power of Attorney in Chart? Yes - validated most recent copy scanned in chart (See row information)  Pre-existing out of facility DNR order (yellow form or pink MOST form) Pink MOST form placed in chart (order not valid for inpatient use);Yellow form placed in chart (order not valid for inpatient use)      No chief complaint on file.   HPI: Patient is a 89 y.o. male seen today for routine visit for chronic disease management. Pt seen and examined today. He is sitting comfortably in the chair in the living room and does not appear to be in distress.  As per nursing staff , pt has periods of confusion and agitation.  He is not able to inform his needs. His speech is very limited, not able to comprehend and follow the conversation.  Ambulates, no recent falls He is on hospice care.  He had diagnosed with covid early this month and was treated with molnupiravir.  Past Medical History:  Diagnosis Date    Allergic rhinitis, cause unspecified    BPH (benign prostatic hyperplasia)    Carotid artery stenosis, asymptomatic    1-39% by dopplers 09/2016 followed by Dr. Darrick Penna   Cataracts, bilateral    Chronic kidney disease (CKD), stage III (moderate) (HCC)    Complete heart block (HCC) 01/23/04   s/p Medtronic PPM implanted by Dr Amil Amen   COPD (chronic obstructive pulmonary disease) (HCC)    Diabetes mellitus    Glaucoma    Hyperlipidemia    Hypertension    Insomnia due to medical condition 08/10/2014   Kidney stones    PAF (paroxysmal atrial fibrillation) (HCC) 10/09/2014   Noted on pacer check with 88 mode switches and longest episode >1 hour.  Now on Apixiban for CHADS2VASC score of 5   Past Surgical History:  Procedure Laterality Date   PACEMAKER INSERTION  2005, 04/16/12   MDT implanted by Dr Amil Amen with generator chnage (MDT Adapta L) by Dr Johney Frame 04/16/12   PERMANENT PACEMAKER GENERATOR CHANGE N/A 04/16/2012   Procedure: PERMANENT PACEMAKER GENERATOR CHANGE;  Surgeon: Hillis Range, MD;  Location: [facility] CATH LAB;  Service: Cardiovascular;  Laterality: N/A;   RIGHT/LEFT HEART CATH AND CORONARY ANGIOGRAPHY N/A 10/18/2020   Procedure: RIGHT/LEFT HEART CATH AND CORONARY ANGIOGRAPHY;  Surgeon: Lennette Bihari, MD;  Location: [facility] INVASIVE CV LAB;  Service: Cardiovascular;  Laterality: N/A;    reports that he quit smoking about 24 years ago. His smoking use included cigarettes. He started smoking about 49 years ago. He has  a 25 pack-year smoking history. He has never used smokeless tobacco. He reports that he does not currently use alcohol. He reports that he does not use drugs. Social History   Socioeconomic History   Marital status: Married    Spouse name: [name]   Number of children: 2   Years of education: BS   Highest education level: Not on file  Occupational History   Occupation: retired  Tobacco Use   Smoking status: Former    Current packs/day: 0.00    Average packs/day: 1 pack/day for  25.0 years (25.0 ttl pk-yrs)    Types: Cigarettes    Start date: 09/16/1973    Quit date: 09/16/1998    Years since quitting: 24.8   Smokeless tobacco: Never  Vaping Use   Vaping status: Never Used  Substance and Sexual Activity   Alcohol use: Not Currently    Comment: 1-2 a month glasses of wine   Drug use: No   Sexual activity: Not on file  Other Topics Concern   Not on file  Social History Narrative   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   Patient is married with 2 children.   Patient is right handed.   Patient has BS degree.   Patient drinks 1 cup daily.         Social Determinants of Health   Financial Resource Strain: Not on file  Food Insecurity: Not on file  Transportation Needs: Not on file  Physical Activity: Not on file  Stress: Not on file  Social Connections: Not on file  Intimate Partner Violence: Not on file    Functional Status Survey:    Family History  Problem Relation Age of Onset   Heart disease Father    Heart attack Father    Alzheimer's disease Sister     Health Maintenance  Topic Date Due   OPHTHALMOLOGY EXAM  Never done   INFLUENZA VACCINE  04/17/2023   COVID-19 Vaccine (7 - 2023-24 season) 05/18/2023   HEMOGLOBIN A1C  07/20/2023   FOOT EXAM  10/19/2023   DTaP/Tdap/Td (2 - Td or Tdap) 10/28/2032   Pneumonia Vaccine 13+ Years old  Completed   Zoster Vaccines- Shingrix  Completed   HPV VACCINES  Aged Out    Allergies  Allergen Reactions   Penicillins Rash and Other (See Comments)    Childhood allergy    Irbesartan     Per [facility]   Penicillin G Benzathine Rash    Outpatient Encounter Medications as of 07/11/2023  Medication Sig   acetaminophen (TYLENOL) 325 MG tablet Take 2 tablets (650 mg total) by mouth every 6 (six) hours as needed for mild pain or headache.   cholestyramine (QUESTRAN) 4 g packet Take 4 g by mouth 3 (three) times daily with meals.   dextromethorphan-guaiFENesin (ROBITUSSIN-DM) 10-100 MG/5ML liquid  Take 10 mLs by mouth every 6 (six) hours as needed for cough.   divalproex (DEPAKOTE SPRINKLE) 125 MG capsule Take 250 mg by mouth 3 (three) times daily.   escitalopram (LEXAPRO) 10 MG tablet Take 10 mg by mouth daily.   ipratropium-albuterol (DUONEB) 0.5-2.5 (3) MG/3ML SOLN Take 3 mLs by nebulization every 8 (eight) hours as needed.   levothyroxine (SYNTHROID) 75 MCG tablet Take 75 mcg by mouth daily before breakfast.   LORazepam (ATIVAN) 1 MG tablet Take 1 mg by mouth 2 (two) times a week. Give 1 mg by mouth  one time a day every Monday and Thursday.   nitroGLYCERIN (NITROSTAT) 0.4  MG SL tablet Place 1 tablet (0.4 mg total) under the tongue every 5 (five) minutes as needed for chest pain.   saccharomyces boulardii (FLORASTOR) 250 MG capsule Take 250 mg by mouth 2 (two) times daily.   senna (SENOKOT) 8.6 MG TABS tablet Take 2 tablets by mouth daily as needed for mild constipation. Give 2 tablet by mouth at bedtime for constipation   No facility-administered encounter medications on file as of 07/11/2023.    Review of Systems  Unable to perform ROS: Dementia  Constitutional:  Negative for fever.  Respiratory:  Negative for cough and shortness of breath.   Gastrointestinal:  Negative for blood in stool.  Genitourinary:  Negative for hematuria.  Psychiatric/Behavioral:  Positive for confusion.     There were no vitals filed for this visit. There is no height or weight on file to calculate BMI. Physical Exam Constitutional:      Appearance: Normal appearance.  HENT:     Head: Atraumatic.  Cardiovascular:     Rate and Rhythm: Normal rate and regular rhythm.     Pulses: Normal pulses.     Heart sounds: Normal heart sounds.  Pulmonary:     Effort: No respiratory distress.     Breath sounds: Normal breath sounds. No stridor. No wheezing or rales.  Abdominal:     General: Bowel sounds are normal. There is no distension.     Palpations: Abdomen is soft.     Tenderness: There is no  abdominal tenderness. There is no right CVA tenderness or guarding.  Musculoskeletal:        General: No swelling.  Neurological:     Mental Status: He is alert. Mental status is at baseline.     Labs reviewed: Basic Metabolic Panel: Recent Labs    08/13/22 0000 01/04/23 0000 05/16/23 0000  NA 144 140 143  K 3.8 3.9 3.5  CL 109* 106 107  CO2 27* 21 24*  BUN 22* 48* 23*  CREATININE 1.4* 1.7* 1.5*  CALCIUM 8.9 8.9 8.5*   Liver Function Tests: Recent Labs    08/13/22 0000 05/16/23 0000  AST 15 9*  ALT 19 14  ALKPHOS 80 67  ALBUMIN 4.0 3.3*   No results for input(s): "LIPASE", "AMYLASE" in the last 8760 hours. No results for input(s): "AMMONIA" in the last 8760 hours. CBC: Recent Labs    08/13/22 0000 01/04/23 0000 05/16/23 0000  WBC 5.5 7.0 9.6  NEUTROABS 2,690.00  --  5,885.00  HGB 14.0 11.7* 16.1  HCT 44 35* 50  PLT 246 168 286   Cardiac Enzymes: No results for input(s): "CKTOTAL", "CKMB", "CKMBINDEX", "TROPONINI" in the last 8760 hours. BNP: Invalid input(s): "POCBNP" Lab Results  Component Value Date   HGBA1C 7.7 01/17/2023   HGBA1C 7.7 01/17/2023   No results found for: "TSH" No results found for: "VITAMINB12" No results found for: "FOLATE" Lab Results  Component Value Date   FERRITIN 103 07/02/2022    Imaging and Procedures obtained prior to SNF admission: CT Head Wo Contrast  Result Date: 06/29/2022 CLINICAL DATA:  Increased lethargy and weakness, COVID EXAM: CT HEAD WITHOUT CONTRAST TECHNIQUE: Contiguous axial images were obtained from the base of the skull through the vertex without intravenous contrast. RADIATION DOSE REDUCTION: This exam was performed according to the departmental dose-optimization program which includes automated exposure control, adjustment of the mA and/or kV according to patient size and/or use of iterative reconstruction technique. COMPARISON:  05/17/2021 FINDINGS: Brain: No evidence of acute infarction, hemorrhage,  mass, mass effect, or midline shift. No hydrocephalus or extra-axial fluid collection. Previously noted right cerebral convexity hematoma has resolved. Periventricular white matter changes, likely the sequela of chronic small vessel ischemic disease. Vascular: No hyperdense vessel. Skull: Normal. Negative for fracture or focal lesion. Sinuses/Orbits: Mild mucosal thickening in the ethmoid air cells. Status post bilateral lens replacements. Other: The mastoid air cells are well aerated. IMPRESSION: No acute intracranial process. A subdural hematoma noted on the 05/17/2021 head CT has resolved. Electronically Signed   By: Wiliam Ke M.D.   On: 06/29/2022 17:25   DG Chest Port 1 View  Result Date: 06/29/2022 CLINICAL DATA:  COVID-19 infection. Hypoxia. Increased lethargy and weakness since yesterday. Rhonchi. EXAM: PORTABLE CHEST 1 VIEW COMPARISON:  05/17/2021 FINDINGS: Normal sized heart. Aortic arch calcifications. Clear lungs with normal vascularity. Stable right subclavian bipolar pacemaker leads. Old, healed right rib fractures. IMPRESSION: No acute abnormality. Electronically Signed   By: Beckie Salts M.D.   On: 06/29/2022 15:51    Assessment/Plan  1. Late onset Alzheimer's dementia with behavioral disturbance (HCC) FAST  Staging  1 - No difficulty either subjectively or objectively 2 - Complains of forgetting the location of objects--subjective word-finding difficulties 3 - Decreased job functioning evident to co-workers. Difficulty in traveling to new locations. Decreased organizational capacity* 4 - Decreased ability to perform complex tasks (e.g., planning dinner for guests, handling. personal finances, difficulty marketing, etc.) 5 - Requires assistance in choosing proper clothing to wear for the day, season, or occasion (e.g., a patient may wear the same clothing repeatedly unless supervised)* 6a - Improperly putting on clothes without assistance or prompting (e.g., may put street clothes  on overnight clothes, put shoes on wrong feet, or have difficulty buttoning clothing) occasionally or more frequently over the past weeks* 6b - Unable to bathe properly (e.g., difficulty adjusting bathwater temp.) occasionally or more frequently over the past weeks* 6c - Inability to handle mechanics of toileting (e.g., forgets to flush the toilet, does not wipe properly or properly dispose of toilet tissue) occasionally or more frequently over the past weeks* 6d - Urinary incontinence occasionally or more frequently over the past weeks* 6e - Fecal incontinence occasionally or more frequently over the past weeks* 7a - Ability to speak limited to approximately a half-dozen different intelligible words or fewer in an average day or the course of an intensive interview 7b - Speech ability is limited to using a single intelligible word on an average day or in an intensive interview (the person may repeat the word over and over) 7c - Ambulatory ability is lost (cannot walk without personal assistance) 7d - Cannot sit up without assistance 7e - Loss of ability to smile 68f - Loss of ability to hold head up independently    Pt is at FAST STAGE 7b Cont with supportive care   2. 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 Hypothyroidism, unspecified type Cont with synthyroid  4. Constipation, unspecified constipation type Cont with senna  5. Hospice care Pt seems comfortable and not in pain  Cont with tylenol prn for pain  Cont with ativan for anxiety     Family/ staff Communication:  care plan discussed with the nursing staff  Labs/tests ordered: none

## 2023-08-06 ENCOUNTER — Encounter: Payer: Self-pay | Admitting: Nurse Practitioner

## 2023-08-06 ENCOUNTER — Non-Acute Institutional Stay (SKILLED_NURSING_FACILITY): Payer: PPO | Admitting: Nurse Practitioner

## 2023-08-06 DIAGNOSIS — F0394 Unspecified dementia, unspecified severity, with anxiety: Secondary | ICD-10-CM | POA: Diagnosis not present

## 2023-08-06 DIAGNOSIS — F02818 Dementia in other diseases classified elsewhere, unspecified severity, with other behavioral disturbance: Secondary | ICD-10-CM

## 2023-08-06 DIAGNOSIS — I442 Atrioventricular block, complete: Secondary | ICD-10-CM | POA: Diagnosis not present

## 2023-08-06 DIAGNOSIS — E7800 Pure hypercholesterolemia, unspecified: Secondary | ICD-10-CM

## 2023-08-06 DIAGNOSIS — I5020 Unspecified systolic (congestive) heart failure: Secondary | ICD-10-CM | POA: Diagnosis not present

## 2023-08-06 DIAGNOSIS — E039 Hypothyroidism, unspecified: Secondary | ICD-10-CM

## 2023-08-06 DIAGNOSIS — I480 Paroxysmal atrial fibrillation: Secondary | ICD-10-CM | POA: Diagnosis not present

## 2023-08-06 DIAGNOSIS — E0849 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: Secondary | ICD-10-CM

## 2023-08-06 DIAGNOSIS — N1831 Chronic kidney disease, stage 3a: Secondary | ICD-10-CM

## 2023-08-06 DIAGNOSIS — I10 Essential (primary) hypertension: Secondary | ICD-10-CM

## 2023-08-06 NOTE — Assessment & Plan Note (Signed)
Blood pressure is controlled, off Valsartan 

## 2023-08-06 NOTE — Assessment & Plan Note (Signed)
chronic, Bun/creat 23/1.5 05/16/23

## 2023-08-06 NOTE — Assessment & Plan Note (Signed)
off Farxiga, EF 30-35% 

## 2023-08-06 NOTE — Assessment & Plan Note (Signed)
off  Atorvastatin, LDL 60 06/13/22

## 2023-08-06 NOTE — Assessment & Plan Note (Signed)
followed by Cardiology, heart rate is in control, off  Eliquis, Hgb 16.1 05/16/23

## 2023-08-06 NOTE — Assessment & Plan Note (Addendum)
on diet, off FArxiga, Hgb a1c 7.7 01/16/23, update Hgb a1c

## 2023-08-06 NOTE — Assessment & Plan Note (Signed)
on Lexapro, Depakote, Lorazepam,  stabilizing mood

## 2023-08-06 NOTE — Progress Notes (Unsigned)
Location:  [facility] Room Number: N110-A Place of Service:  SNF (31) Provider:  Serai Tukes X, NP  Patient Care Team: Raymundo Rout X, NP as PCP - General (Internal Medicine) Quintella Reichert, MD as PCP - Cardiology (Cardiology) Renford Dills, MD (Internal Medicine)  Extended Emergency Contact Information Primary Emergency Contact: Ebrahim,[name] Address: [address]          [place], [place] [address] Darden Amber of [place] Home Phone: [phone] Mobile Phone: [phone] Relation: Spouse Secondary Emergency Contact: Bevens,[name] Mobile Phone: [phone] Relation: Son  Code Status:  DNR Goals of care: Advanced Directive information    08/06/2023    9:01 AM  Advanced Directives  Does Patient Have a Medical Advance Directive? Yes  Type of Estate agent of [place];Living will;Out of facility DNR (pink MOST or yellow form)  Does patient want to make changes to medical advance directive? No - Patient declined  Copy of Healthcare Power of Attorney in Chart? Yes - validated most recent copy scanned in chart (See row information)  Pre-existing out of facility DNR order (yellow form or pink MOST form) Pink MOST form placed in chart (order not valid for inpatient use);Yellow form placed in chart (order not valid for inpatient use)     Chief Complaint  Patient presents with  . Medical Management of Chronic Issues    Routine Visit  . Immunizations    Covid   . Quality Metric Gaps    Eye Exam and Hemoglobin A1C    HPI:  Pt is a 89 y.o. male seen today for medical management of chronic diseases.   Dysphagia III diet Senile dementia,  off  Donepezil, Memantine, on Depakote, followed by Neurology, Hospice for supportive care, MMSE 2/30 06/07/22 Depression/anxiety, on Lexapro, Depakote, Lorazepam,  stabilizing mood             Adult Failure To Thrive, weight loss-stable, followed by dietary             Afib followed by Cardiology,  heart rate is in control, off  Eliquis, Hgb 16.1 05/16/23             CAD, off Statin, on prn NTG             CHFrEF, off Farxiga, EF 30-35%             CHB s/p PPM             T2DM, on diet, off FArxiga, Hgb a1c 7.7 01/16/23             Hypothyroidism, taking Levothyroxine, TSH 2.82 06/13/22             Hyperlipidemia, off  Atorvastatin, LDL 60 06/13/22             HTN, off Valsartan             CKD chronic, Bun/creat 23/1.5 05/16/23     Past Medical History:  Diagnosis Date  . Allergic rhinitis, cause unspecified   . BPH (benign prostatic hyperplasia)   . Carotid artery stenosis, asymptomatic    1-39% by dopplers 09/2016 followed by Dr. Darrick Penna  . Cataracts, bilateral   . Chronic kidney disease (CKD), stage III (moderate) (HCC)   . Complete heart block (HCC) 01/23/04   s/p Medtronic PPM implanted by Dr Amil Amen  . COPD (chronic obstructive pulmonary disease) (HCC)   . Diabetes mellitus   . Glaucoma   . Hyperlipidemia   . Hypertension   .  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 Kidney stones   . PAF (paroxysmal atrial fibrillation) (HCC) 10/09/2014   Noted on pacer check with 88 mode switches and longest episode >1 hour.  Now on Apixiban for CHADS2VASC score of 5   Past Surgical History:  Procedure Laterality Date  . PACEMAKER INSERTION  2005, 04/16/12   MDT implanted by Dr Amil Amen with generator chnage (MDT Adapta L) by Dr Johney Frame 04/16/12  . PERMANENT PACEMAKER GENERATOR CHANGE N/A 04/16/2012   Procedure: PERMANENT PACEMAKER GENERATOR CHANGE;  Surgeon: Hillis Range, MD;  Location: [facility] CATH LAB;  Service: Cardiovascular;  Laterality: N/A;  . RIGHT/LEFT HEART CATH AND CORONARY ANGIOGRAPHY N/A 10/18/2020   Procedure: RIGHT/LEFT HEART CATH AND CORONARY ANGIOGRAPHY;  Surgeon: Lennette Bihari, MD;  Location: [facility] INVASIVE CV LAB;  Service: Cardiovascular;  Laterality: N/A;    Allergies  Allergen Reactions  . Penicillins Rash and Other (See Comments)    Childhood allergy   . Irbesartan      Per [facility]  . Penicillin G Benzathine Rash    Outpatient Encounter Medications as of 08/06/2023  Medication Sig  . acetaminophen (TYLENOL) 325 MG tablet Take 2 tablets (650 mg total) by mouth every 6 (six) hours as needed for mild pain or headache.  . cholestyramine (QUESTRAN) 4 g packet Take 4 g by mouth 3 (three) times daily with meals.  [name] Kitchen dextromethorphan-guaiFENesin (ROBITUSSIN-DM) 10-100 MG/5ML liquid Take 10 mLs by mouth every 6 (six) hours as needed for cough.  . divalproex (DEPAKOTE SPRINKLE) 125 MG capsule Take 250 mg by mouth 3 (three) times daily.  [name] Kitchen escitalopram (LEXAPRO) 10 MG tablet Take 10 mg by mouth daily.  [name] Kitchen ipratropium-albuterol (DUONEB) 0.5-2.5 (3) MG/3ML SOLN Take 3 mLs by nebulization every 8 (eight) hours as needed.  [name] Kitchen levothyroxine (SYNTHROID) 75 MCG tablet Take 75 mcg by mouth daily before breakfast.  . LORazepam (ATIVAN) 1 MG tablet Take 1 mg by mouth 2 (two) times a week. Give 1 mg by mouth  one time a day every Monday and Thursday.  . nitroGLYCERIN (NITROSTAT) 0.4 MG SL tablet Place 1 tablet (0.4 mg total) under the tongue every 5 (five) minutes as needed for chest pain.  [name] Kitchen saccharomyces boulardii (FLORASTOR) 250 MG capsule Take 250 mg by mouth 2 (two) times daily.  [name] Kitchen senna (SENOKOT) 8.6 MG TABS tablet Take 2 tablets by mouth daily as needed for mild constipation. Give 2 tablet by mouth at bedtime for constipation   No facility-administered encounter medications on file as of 08/06/2023.    Review of Systems  Unable to perform ROS: Dementia    Immunization History  Administered Date(s) Administered  . Fluad Quad(high Dose 65+) 08/05/2023  . Influenza-Unspecified 07/04/2014, 08/05/2022  . Moderna Covid-19 Vaccine Bivalent Booster 28yrs & up 07/18/2022  . Moderna SARS-COV2 Booster Vaccination 07/25/2020, 02/13/2021  . Moderna Sars-Covid-2 Vaccination 09/20/2019, 10/18/2019  . PNEUMOCOCCAL CONJUGATE-20 08/07/2022  . Research officer, trade [employer] 67yrs  & up 06/20/2021  . Respiratory Syncytial Virus Vaccine,Recomb Aduvanted(Arexvy) 10/04/2022  . Tdap 10/28/2022  . Zoster Recombinant(Shingrix) 11/06/2017, 03/28/2018   Pertinent  Health Maintenance Due  Topic Date Due  . HEMOGLOBIN A1C  07/20/2023  . FOOT EXAM  10/19/2023  . INFLUENZA VACCINE  Completed  . OPHTHALMOLOGY EXAM  Discontinued      07/05/2022    4:50 PM 10/18/2022   11:21 AM 11/04/2022    2:33 PM 11/15/2022    8:54 AM 12/26/2022   11:29 AM  Fall Risk  Falls in the past year? 0 0 0 0 0  Was there an injury with Fall? 0 0 0 0 0  Fall Risk Category Calculator 0 0 0 0 0  Fall Risk Category (Retired) Low      (RETIRED) Patient Fall Risk Level High fall risk      Patient at Risk for Falls Due to History of fall(s) No Fall Risks  No Fall Risks No Fall Risks  Fall risk Follow up Falls evaluation completed Falls evaluation completed Falls evaluation completed Falls evaluation completed Falls evaluation completed   Functional Status Survey:    Vitals:   08/06/23 0854  BP: 114/82  Pulse: 65  Resp: 20  Temp: (!) 97.2 F (36.2 C)  SpO2: 96%  Weight: 138 lb (62.6 kg)  Height: 5\' 6"  (1.676 m)   Body mass index is 22.27 kg/m. Physical Exam Vitals and nursing note reviewed.  Constitutional:      Appearance: Normal appearance.  HENT:     Head: Normocephalic and atraumatic.     Nose: Nose normal.     Mouth/Throat:     Mouth: Mucous membranes are moist.  Eyes:     Extraocular Movements: Extraocular movements intact.     Conjunctiva/sclera: Conjunctivae normal.     Pupils: Pupils are equal, round, and reactive to light.  Cardiovascular:     Rate and Rhythm: Normal rate and regular rhythm.     Heart sounds: No murmur heard. Pulmonary:     Effort: Pulmonary effort is normal.     Breath sounds: No rales.  Abdominal:     General: Bowel sounds are normal.     Palpations: Abdomen is soft.     Tenderness: There is no abdominal tenderness.     Comments: Loose stools  2-3x/day  Genitourinary:    Comments: External hemorrhoids from previous examination.  Musculoskeletal:     Cervical back: Normal range of motion and neck supple.     Right lower leg: No edema.     Left lower leg: No edema.  Skin:    General: Skin is warm and dry.  Neurological:     General: No focal deficit present.     Mental Status: He is alert. Mental status is at baseline.     Gait: Gait abnormal.     Comments: Oriented to person  Psychiatric:        Mood and Affect: Mood normal.        Behavior: Behavior normal.     Comments: Followed simple directions    Labs reviewed: Recent Labs    08/13/22 0000 01/04/23 0000 05/16/23 0000  NA 144 140 143  K 3.8 3.9 3.5  CL 109* 106 107  CO2 27* 21 24*  BUN 22* 48* 23*  CREATININE 1.4* 1.7* 1.5*  CALCIUM 8.9 8.9 8.5*   Recent Labs    08/13/22 0000 05/16/23 0000  AST 15 9*  ALT 19 14  ALKPHOS 80 67  ALBUMIN 4.0 3.3*   Recent Labs    08/13/22 0000 01/04/23 0000 05/16/23 0000  WBC 5.5 7.0 9.6  NEUTROABS 2,690.00  --  5,885.00  HGB 14.0 11.7* 16.1  HCT 44 35* 50  PLT 246 168 286   No results found for: "TSH" Lab Results  Component Value Date   HGBA1C 7.7 01/17/2023   HGBA1C 7.7 01/17/2023   Lab Results  Component Value Date   CHOL 128 12/16/2016   HDL 55 12/16/2016   LDLCALC 58 12/16/2016   TRIG  97 06/29/2022   CHOLHDL 2.3 12/16/2016    Significant Diagnostic Results in last 30 days:  No results found.  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   Labs/tests ordered:  TSH, Hgb A1c.   Time spend 30 minutes

## 2023-08-06 NOTE — Assessment & Plan Note (Addendum)
taking Levothyroxine, TSH 2.82 06/13/22, update TSH

## 2023-08-06 NOTE — Assessment & Plan Note (Signed)
off  Donepezil, Memantine, on Depakote, followed by Neurology, Hospice for supportive care, MMSE 2/30 06/07/22 

## 2023-08-06 NOTE — Assessment & Plan Note (Addendum)
off Statin, on prn NTG, s/p PPM

## 2023-08-07 ENCOUNTER — Encounter: Payer: Self-pay | Admitting: Nurse Practitioner

## 2023-08-07 LAB — TSH: TSH: 6.82 — AB (ref 0.41–5.90)

## 2023-08-07 LAB — HEMOGLOBIN A1C: Hemoglobin A1C: 6

## 2023-08-20 ENCOUNTER — Other Ambulatory Visit: Payer: Self-pay | Admitting: Adult Health

## 2023-08-20 DIAGNOSIS — F0394 Unspecified dementia, unspecified severity, with anxiety: Secondary | ICD-10-CM

## 2023-08-20 MED ORDER — LORAZEPAM 0.5 MG PO TABS: ORAL_TABLET | ORAL | 0 refills | Status: DC

## 2023-08-22 ENCOUNTER — Encounter: Payer: Self-pay | Admitting: Nurse Practitioner

## 2023-08-22 ENCOUNTER — Non-Acute Institutional Stay (SKILLED_NURSING_FACILITY): Payer: PPO | Admitting: Nurse Practitioner

## 2023-08-22 DIAGNOSIS — F0394 Unspecified dementia, unspecified severity, with anxiety: Secondary | ICD-10-CM

## 2023-08-22 DIAGNOSIS — E0849 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: Secondary | ICD-10-CM

## 2023-08-22 DIAGNOSIS — W19XXXA Unspecified fall, initial encounter: Secondary | ICD-10-CM | POA: Diagnosis not present

## 2023-08-22 DIAGNOSIS — I480 Paroxysmal atrial fibrillation: Secondary | ICD-10-CM | POA: Diagnosis not present

## 2023-08-22 DIAGNOSIS — I5020 Unspecified systolic (congestive) heart failure: Secondary | ICD-10-CM

## 2023-08-22 DIAGNOSIS — I442 Atrioventricular block, complete: Secondary | ICD-10-CM | POA: Diagnosis not present

## 2023-08-22 DIAGNOSIS — E039 Hypothyroidism, unspecified: Secondary | ICD-10-CM

## 2023-08-22 DIAGNOSIS — R627 Adult failure to thrive: Secondary | ICD-10-CM

## 2023-08-22 NOTE — Assessment & Plan Note (Addendum)
on diet, off FArxiga, Hgb A1c 6.0 08/07/23, Bun/creat 23/1.5 05/16/23

## 2023-08-22 NOTE — Assessment & Plan Note (Signed)
weight loss-stable, followed by dietary

## 2023-08-22 NOTE — Assessment & Plan Note (Signed)
 followed by Cardiology, heart rate is in control, off  Eliquis, Hgb 16.1 05/16/23

## 2023-08-22 NOTE — Assessment & Plan Note (Addendum)
 off Statin, on prn NTG, s/p PPM

## 2023-08-22 NOTE — Progress Notes (Signed)
Location:   [facility] Room Number: 110 Place of Service:  SNF (31) Provider: Arna Snipe Myosha Cuadras NP  Laketta Soderberg X, NP  Patient Care Team: Clance Baquero X, NP as PCP - General (Internal Medicine) Quintella Reichert, MD as PCP - Cardiology (Cardiology) Renford Dills, MD (Internal Medicine)  Extended Emergency Contact Information Primary Emergency Contact: Vidrio,[name] Address: [address]          [place], [place] [address] [place] Home Phone: [phone] Mobile Phone: [phone] Relation: Spouse Secondary Emergency Contact: Widmayer,[name] Mobile Phone: [phone] Relation: Son  Code Status:  DNR Goals of care: Advanced Directive information    08/06/2023    9:01 AM  Advanced Directives  Does Patient Have a Medical Advance Directive? Yes  Type of Estate agent of [place];Living will;Out of facility DNR (pink MOST or yellow form)  Does patient want to make changes to medical advance directive? No - Patient declined  Copy of Healthcare Power of Attorney in Chart? Yes - validated most recent copy scanned in chart (See row information)  Pre-existing out of facility DNR order (yellow form or pink MOST form) Pink MOST form placed in chart (order not valid for inpatient use);Yellow form placed in chart (order not valid for inpatient use)     Chief Complaint  Patient presents with   Medical Management of Chronic Issues    HPI:  Pt is a 89 y.o. male seen today for medical management of chronic diseases.     Depression/anxiety, on Lexapro, Depakote, Lorazepam,  stabilizing mood Frequent falls, contributory factors: increased frailty, gait issues, and poor safety awareness, last fall 08/22/23 w/o apparent injury.              Adult Failure To Thrive, weight loss-stable, followed by dietary             Afib followed by Cardiology, heart rate is in control, off  Eliquis, Hgb 16.1 05/16/23             CAD, off Statin, on prn NTG              CHFrEF, off Farxiga, EF 30-35%, compensated clinically.              CHB s/p PPM             T2DM, on diet, off FArxiga, Hgb a1c 6.0 08/07/23             Hypothyroidism, taking Levothyroxine since 08/12/23, TSH 6.82 08/07/23,  f/u TSH 8 weeks ordered 08/11/23             Hyperlipidemia, off  Atorvastatin, LDL 60 06/13/22             HTN, off Valsartan             CKD chronic, Bun/creat 23/1.5 05/16/23               Past Medical History:  Diagnosis Date   Allergic rhinitis, cause unspecified    BPH (benign prostatic hyperplasia)    Carotid artery stenosis, asymptomatic    1-39% by dopplers 09/2016 followed by Dr. Darrick Penna   Cataracts, bilateral    Chronic kidney disease (CKD), stage III (moderate) (HCC)    Complete heart block (HCC) 01/23/04   s/p Medtronic PPM implanted by Dr Amil Amen   COPD (chronic obstructive pulmonary disease) (HCC)    Diabetes mellitus    Glaucoma    Hyperlipidemia    Hypertension  Insomnia due to medical condition 08/10/2014   Kidney stones    PAF (paroxysmal atrial fibrillation) (HCC) 10/09/2014   Noted on pacer check with 88 mode switches and longest episode >1 hour.  Now on Apixiban for CHADS2VASC score of 5   Past Surgical History:  Procedure Laterality Date   PACEMAKER INSERTION  2005, 04/16/12   MDT implanted by Dr Amil Amen with generator chnage (MDT Adapta L) by Dr Johney Frame 04/16/12   PERMANENT PACEMAKER GENERATOR CHANGE N/A 04/16/2012   Procedure: PERMANENT PACEMAKER GENERATOR CHANGE;  Surgeon: Hillis Range, MD;  Location: [facility] CATH LAB;  Service: Cardiovascular;  Laterality: N/A;   RIGHT/LEFT HEART CATH AND CORONARY ANGIOGRAPHY N/A 10/18/2020   Procedure: RIGHT/LEFT HEART CATH AND CORONARY ANGIOGRAPHY;  Surgeon: Lennette Bihari, MD;  Location: [facility] INVASIVE CV LAB;  Service: Cardiovascular;  Laterality: N/A;    Allergies  Allergen Reactions   Penicillins Rash and Other (See Comments)    Childhood allergy    Irbesartan     Per [facility]   Penicillin G Benzathine  Rash    Allergies as of 08/22/2023       Reactions   Penicillins Rash, Other (See Comments)   Childhood allergy    Irbesartan    Per [facility]   Penicillin G Benzathine Rash        Medication List        Accurate as of August 22, 2023 12:24 PM. If you have any questions, ask your nurse or doctor.          acetaminophen 325 MG tablet Commonly known as: TYLENOL Take 2 tablets (650 mg total) by mouth every 6 (six) hours as needed for mild pain or headache.   cholestyramine 4 g packet Commonly known as: QUESTRAN Take 4 g by mouth 3 (three) times daily with meals.   dextromethorphan-guaiFENesin 10-100 MG/5ML liquid Commonly known as: ROBITUSSIN-DM Take 10 mLs by mouth every 6 (six) hours as needed for cough.   divalproex 125 MG capsule Commonly known as: DEPAKOTE SPRINKLE Take 250 mg by mouth 3 (three) times daily.   escitalopram 10 MG tablet Commonly known as: LEXAPRO Take 10 mg by mouth daily.   ipratropium-albuterol 0.5-2.5 (3) MG/3ML Soln Commonly known as: DUONEB Take 3 mLs by nebulization every 8 (eight) hours as needed.   levothyroxine 75 MCG tablet Commonly known as: SYNTHROID Take 75 mcg by mouth daily before breakfast.   LORazepam 0.5 MG tablet Commonly known as: ATIVAN Give 1 tablet daily on Monday and Wednesday   nitroGLYCERIN 0.4 MG SL tablet Commonly known as: NITROSTAT Place 1 tablet (0.4 mg total) under the tongue every 5 (five) minutes as needed for chest pain.   saccharomyces boulardii 250 MG capsule Commonly known as: FLORASTOR Take 250 mg by mouth 2 (two) times daily.   senna 8.6 MG Tabs tablet Commonly known as: SENOKOT Take 2 tablets by mouth daily as needed for mild constipation. Give 2 tablet by mouth at bedtime for constipation        Review of Systems  Unable to perform ROS: Dementia    Immunization History  Administered Date(s) Administered   Fluad Quad(high Dose 65+) 08/05/2023   Influenza-Unspecified 07/04/2014,  08/05/2022   Moderna Covid-19 Vaccine Bivalent Booster 46yrs & up 07/18/2022   Moderna SARS-COV2 Booster Vaccination 07/25/2020, 02/13/2021   Moderna Sars-Covid-2 Vaccination 09/20/2019, 10/18/2019   PNEUMOCOCCAL CONJUGATE-20 08/07/2022   Pfizer Covid-19 Vaccine Bivalent Booster 62yrs & up 06/20/2021   Respiratory Syncytial Virus Vaccine,Recomb Aduvanted(Arexvy) 10/04/2022   Tdap  10/28/2022   Zoster Recombinant(Shingrix) 11/06/2017, 03/28/2018   Pertinent  Health Maintenance Due  Topic Date Due   HEMOGLOBIN A1C  07/20/2023   FOOT EXAM  10/19/2023   INFLUENZA VACCINE  Completed   OPHTHALMOLOGY EXAM  Discontinued      07/05/2022    4:50 PM 10/18/2022   11:21 AM 11/04/2022    2:33 PM 11/15/2022    8:54 AM 12/26/2022   11:29 AM  Fall Risk  Falls in the past year? 0 0 0 0 0  Was there an injury with Fall? 0 0 0 0 0  Fall Risk Category Calculator 0 0 0 0 0  Fall Risk Category (Retired) Low      (RETIRED) Patient Fall Risk Level High fall risk      Patient at Risk for Falls Due to History of fall(s) No Fall Risks  No Fall Risks No Fall Risks  Fall risk Follow up Falls evaluation completed Falls evaluation completed Falls evaluation completed Falls evaluation completed Falls evaluation completed   Functional Status Survey:    Vitals:   08/22/23 1208  BP: 132/72  Pulse: 62  Resp: 18  Temp: 98.3 F (36.8 C)  SpO2: 94%  Weight: 136 lb 3.2 oz (61.8 kg)   Body mass index is 21.98 kg/m. Physical Exam Vitals and nursing note reviewed.  Constitutional:      Appearance: Normal appearance.  HENT:     Head: Normocephalic and atraumatic.     Nose: Nose normal.     Mouth/Throat:     Mouth: Mucous membranes are moist.  Eyes:     Extraocular Movements: Extraocular movements intact.     Conjunctiva/sclera: Conjunctivae normal.     Pupils: Pupils are equal, round, and reactive to light.  Cardiovascular:     Rate and Rhythm: Normal rate and regular rhythm.     Heart sounds: No murmur  heard. Pulmonary:     Effort: Pulmonary effort is normal.     Breath sounds: No rales.  Abdominal:     General: Bowel sounds are normal.     Palpations: Abdomen is soft.     Tenderness: There is no abdominal tenderness.     Comments: Loose stools 2-3x/day  Genitourinary:    Comments: External hemorrhoids from previous examination.  Musculoskeletal:        General: No tenderness.     Cervical back: Normal range of motion and neck supple.     Right lower leg: No edema.     Left lower leg: No edema.     Comments: Able to stand up with assistance w/o pain noted  Skin:    General: Skin is warm and dry.  Neurological:     General: No focal deficit present.     Mental Status: He is alert. Mental status is at baseline.     Gait: Gait abnormal.     Comments: Oriented to person  Psychiatric:        Mood and Affect: Mood normal.        Behavior: Behavior normal.     Comments: Followed simple directions     Labs reviewed: Recent Labs    01/04/23 0000 05/16/23 0000  NA 140 143  K 3.9 3.5  CL 106 107  CO2 21 24*  BUN 48* 23*  CREATININE 1.7* 1.5*  CALCIUM 8.9 8.5*   Recent Labs    05/16/23 0000  AST 9*  ALT 14  ALKPHOS 67  ALBUMIN 3.3*   Recent Labs  01/04/23 0000 05/16/23 0000  WBC 7.0 9.6  NEUTROABS  --  5,885.00  HGB 11.7* 16.1  HCT 35* 50  PLT 168 286   No results found for: "TSH" Lab Results  Component Value Date   HGBA1C 7.7 01/17/2023   HGBA1C 7.7 01/17/2023   Lab Results  Component Value Date   CHOL 128 12/16/2016   HDL 55 12/16/2016   LDLCALC 58 12/16/2016   TRIG 97 06/29/2022   CHOLHDL 2.3 12/16/2016    Significant Diagnostic Results in last 30 days:  No results found.  Assessment/Plan  Fall contributory factors: increased frailty, gait issues, and poor safety awareness, last fall 08/22/23 w/o apparent injury.   Adult failure to thrive  weight loss-stable, followed by dietary  PAF (paroxysmal atrial fibrillation) (HCC) followed by  Cardiology, heart rate is in control, off  Eliquis, Hgb 16.1 05/16/23  Atrioventricular block, complete (HCC)  off Statin, on prn NTG, s/p PPM  HFrEF (heart failure with reduced ejection fraction) (HCC)  off Farxiga, EF 30-35%, compensated clinically.   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   Labs/tests ordered:  none  Time spend 30 minutes.

## 2023-08-22 NOTE — Assessment & Plan Note (Signed)
on Lexapro, Depakote, Lorazepam,  stabilizing mood

## 2023-08-22 NOTE — Assessment & Plan Note (Signed)
off Farxiga, EF 30-35%, compensated clinically

## 2023-08-22 NOTE — Assessment & Plan Note (Signed)
contributory factors: increased frailty, gait issues, and poor safety awareness, last fall 08/22/23 w/o apparent injury.

## 2023-08-22 NOTE — Assessment & Plan Note (Signed)
taking Levothyroxine since 08/12/23, TSH 6.82 08/07/23, f/u TSH 8 weeks ordered 08/11/23

## 2023-09-15 ENCOUNTER — Encounter: Payer: Self-pay | Admitting: Sports Medicine

## 2023-09-15 ENCOUNTER — Non-Acute Institutional Stay (INDEPENDENT_AMBULATORY_CARE_PROVIDER_SITE_OTHER): Payer: Self-pay | Admitting: Sports Medicine

## 2023-09-15 DIAGNOSIS — F0390 Unspecified dementia without behavioral disturbance: Secondary | ICD-10-CM

## 2023-09-15 DIAGNOSIS — L539 Erythematous condition, unspecified: Secondary | ICD-10-CM | POA: Diagnosis not present

## 2023-09-15 DIAGNOSIS — W19XXXA Unspecified fall, initial encounter: Secondary | ICD-10-CM

## 2023-09-15 NOTE — Progress Notes (Signed)
Location:  [facility] Room Number: N110A Place of Service:  SNF (31) Provider:  Dr. Venita Sheffield  PCP: Mast, Man X, NP  Patient Care Team: Mast, Man X, NP as PCP - General (Internal Medicine) Quintella Reichert, MD as PCP - Cardiology (Cardiology) Renford Dills, MD (Internal Medicine)  Extended Emergency Contact Information Primary Emergency Contact: Ellegood,[name] Address: [address]          [place], [place] [address] [place] Home Phone: [phone] Mobile Phone: [phone] Relation: Spouse Secondary Emergency Contact: Betker,[name] Mobile Phone: [phone] Relation: Son  Code Status:  DNR Goals of care: Advanced Directive information    09/15/2023   11:23 AM  Advanced Directives  Does Patient Have a Medical Advance Directive? Yes  Type of Estate agent of [place];Out of facility DNR (pink MOST or yellow form);Living will  Does patient want to make changes to medical advance directive? No - Patient declined  Copy of Healthcare Power of Attorney in Chart? Yes - validated most recent copy scanned in chart (See row information)     Chief Complaint  Patient presents with   Acute Visit    Fall    HPI:  Pt is a 89 y.o. male seen today for an acute visit for Fall Patient is seen and examined in the living room.  Patient has advanced dementia and a poor historian.  Majority of the history is obtained from the nursing staff.  As per nursing staff  patient had a witnessed fall.  No external injuries noted.  Neurochecks were initiated by the nursing staff.  Staff noticed small area with slightly erythema on the left side of the head. No new agitation or behaviors reported by the staff. Patient is ambulatory.  Is sitting comfortably on the sofa.  Opens his eyes on calling his name, he is nonverbal. As per staff he needs 1 person assistant with ADLs.  Past Medical History:  Diagnosis Date   Allergic  rhinitis, cause unspecified    BPH (benign prostatic hyperplasia)    Carotid artery stenosis, asymptomatic    1-39% by dopplers 09/2016 followed by Dr. Darrick Penna   Cataracts, bilateral    Chronic kidney disease (CKD), stage III (moderate) (HCC)    Complete heart block (HCC) 01/23/04   s/p Medtronic PPM implanted by Dr Amil Amen   COPD (chronic obstructive pulmonary disease) (HCC)    Diabetes mellitus    Glaucoma    Hyperlipidemia    Hypertension    Insomnia due to medical condition 08/10/2014   Kidney stones    PAF (paroxysmal atrial fibrillation) (HCC) 10/09/2014   Noted on pacer check with 88 mode switches and longest episode >1 hour.  Now on Apixiban for CHADS2VASC score of 5   Past Surgical History:  Procedure Laterality Date   PACEMAKER INSERTION  2005, 04/16/12   MDT implanted by Dr Amil Amen with generator chnage (MDT Adapta L) by Dr Johney Frame 04/16/12   PERMANENT PACEMAKER GENERATOR CHANGE N/A 04/16/2012   Procedure: PERMANENT PACEMAKER GENERATOR CHANGE;  Surgeon: Hillis Range, MD;  Location: [facility] CATH LAB;  Service: Cardiovascular;  Laterality: N/A;   RIGHT/LEFT HEART CATH AND CORONARY ANGIOGRAPHY N/A 10/18/2020   Procedure: RIGHT/LEFT HEART CATH AND CORONARY ANGIOGRAPHY;  Surgeon: Lennette Bihari, MD;  Location: [facility] INVASIVE CV LAB;  Service: Cardiovascular;  Laterality: N/A;    Allergies  Allergen Reactions   Penicillins Rash and Other (See Comments)    Childhood allergy    Irbesartan  Per [facility]   Penicillin G Benzathine Rash    Outpatient Encounter Medications as of 09/15/2023  Medication Sig   acetaminophen (TYLENOL) 325 MG tablet Take 2 tablets (650 mg total) by mouth every 6 (six) hours as needed for mild pain or headache.   acetaminophen (TYLENOL) 325 MG tablet Take 650 mg by mouth 2 (two) times daily.   cholestyramine (QUESTRAN) 4 g packet Take 4 g by mouth 2 (two) times daily.   dextromethorphan-guaiFENesin (ROBITUSSIN-DM) 10-100 MG/5ML liquid Take 10 mLs by mouth every 6 (six)  hours as needed for cough.   divalproex (DEPAKOTE SPRINKLE) 125 MG capsule Take 250 mg by mouth 3 (three) times daily.   escitalopram (LEXAPRO) 10 MG tablet Take 10 mg by mouth daily.   ipratropium-albuterol (DUONEB) 0.5-2.5 (3) MG/3ML SOLN Take 3 mLs by nebulization every 8 (eight) hours as needed.   levothyroxine (SYNTHROID) 100 MCG tablet Take 100 mcg by mouth daily before breakfast.   LORazepam (ATIVAN) 0.5 MG tablet Give 1 tablet daily on Monday and Wednesday   nitroGLYCERIN (NITROSTAT) 0.4 MG SL tablet Place 1 tablet (0.4 mg total) under the tongue every 5 (five) minutes as needed for chest pain.   OXYGEN 2lpm as needed for SOB   saccharomyces boulardii (FLORASTOR) 250 MG capsule Take 250 mg by mouth 2 (two) times daily.   senna (SENOKOT) 8.6 MG TABS tablet Take 2 tablets by mouth at bedtime.   [DISCONTINUED] levothyroxine (SYNTHROID) 75 MCG tablet Take 75 mcg by mouth daily before breakfast.   No facility-administered encounter medications on file as of 09/15/2023.    Review of Systems  Unable to perform ROS: Dementia    Immunization History  Administered Date(s) Administered   Fluad Quad(high Dose 65+) 08/05/2023   Influenza-Unspecified 07/04/2014, 08/05/2022, 08/05/2023   Moderna Covid-19 Vaccine Bivalent Booster 73yrs & up 07/18/2022   Moderna SARS-COV2 Booster Vaccination 07/25/2020, 02/13/2021   Moderna Sars-Covid-2 Vaccination 09/20/2019, 10/18/2019   PNEUMOCOCCAL CONJUGATE-20 08/07/2022   Pfizer Covid-19 Vaccine Bivalent Booster 82yrs & up 06/20/2021, 08/19/2023   Respiratory Syncytial Virus Vaccine,Recomb Aduvanted(Arexvy) 10/04/2022   Tdap 10/28/2022   Zoster Recombinant(Shingrix) 11/06/2017, 03/28/2018   Pertinent  Health Maintenance Due  Topic Date Due   FOOT EXAM  10/19/2023   HEMOGLOBIN A1C  02/04/2024   INFLUENZA VACCINE  Completed   OPHTHALMOLOGY EXAM  Discontinued      07/05/2022    4:50 PM 10/18/2022   11:21 AM 11/04/2022    2:33 PM 11/15/2022    8:54 AM  12/26/2022   11:29 AM  Fall Risk  Falls in the past year? 0 0 0 0 0  Was there an injury with Fall? 0 0 0 0 0  Fall Risk Category Calculator 0 0 0 0 0  Fall Risk Category (Retired) Low      (RETIRED) Patient Fall Risk Level High fall risk      Patient at Risk for Falls Due to History of fall(s) No Fall Risks  No Fall Risks No Fall Risks  Fall risk Follow up Falls evaluation completed Falls evaluation completed Falls evaluation completed Falls evaluation completed Falls evaluation completed   Functional Status Survey:    Vitals:   09/15/23 1115 09/15/23 1134  BP: (!) 140/66 120/70  Pulse: 65   Resp: 18   Temp: 97.7 F (36.5 C)   SpO2: 97%   Weight: 136 lb 3.2 oz (61.8 kg)   Height: 5\' 6"  (1.676 m)    Body mass index is 21.98 kg/m. Physical Exam  Constitutional:      Appearance: Normal appearance.  HENT:     Head: Normocephalic and atraumatic.     Comments: No erythema noted on the forehead Cardiovascular:     Rate and Rhythm: Normal rate and regular rhythm.     Pulses: Normal pulses.     Heart sounds: Normal heart sounds.  Pulmonary:     Effort: No respiratory distress.     Breath sounds: No stridor. No wheezing or rales.  Abdominal:     General: Bowel sounds are normal. There is no distension.     Palpations: Abdomen is soft.     Tenderness: There is no abdominal tenderness. There is no right CVA tenderness or guarding.  Musculoskeletal:        General: No swelling.  Neurological:     Mental Status: He is alert. Mental status is at baseline.     Labs reviewed: Recent Labs    01/04/23 0000 05/16/23 0000  NA 140 143  K 3.9 3.5  CL 106 107  CO2 21 24*  BUN 48* 23*  CREATININE 1.7* 1.5*  CALCIUM 8.9 8.5*   Recent Labs    05/16/23 0000  AST 9*  ALT 14  ALKPHOS 67  ALBUMIN 3.3*   Recent Labs    01/04/23 0000 05/16/23 0000  WBC 7.0 9.6  NEUTROABS  --  5,885.00  HGB 11.7* 16.1  HCT 35* 50  PLT 168 286   Lab Results  Component Value Date   TSH  6.82 (A) 08/07/2023   Lab Results  Component Value Date   HGBA1C 6.0 08/07/2023   Lab Results  Component Value Date   CHOL 128 12/16/2016   HDL 55 12/16/2016   LDLCALC 58 12/16/2016   TRIG 97 06/29/2022   CHOLHDL 2.3 12/16/2016    Significant Diagnostic Results in last 30 days:  No results found.  Assessment/Plan  1. Erythema (Primary) 2. Fall, initial encounter Patient at baseline as per nursing staff No agitation or behaviors reported Fall precautions Continue with neurochecks  3. Major neurocognitive disorder (HCC) Continue with supportive care. Cont with lexapro, ativan   Other orders - OXYGEN; 2lpm as needed for SOB - levothyroxine (SYNTHROID) 100 MCG tablet; Take 100 mcg by mouth daily before breakfast. - acetaminophen (TYLENOL) 325 MG tablet; Take 650 mg by mouth 2 (two) times daily. - Hemoglobin A1c - TSH   Care plan discussed with the nursing staff   30 min Total time spent for obtaining history,  performing a medically appropriate examination and evaluation, reviewing the tests,   documenting clinical information in the electronic or other health record, independently interpreting results ,care coordination (not separately reported)

## 2023-09-30 LAB — TSH: TSH: 2.42 (ref 0.41–5.90)

## 2023-10-08 ENCOUNTER — Other Ambulatory Visit: Payer: Self-pay | Admitting: Adult Health

## 2023-10-08 DIAGNOSIS — F0394 Unspecified dementia, unspecified severity, with anxiety: Secondary | ICD-10-CM

## 2023-10-08 MED ORDER — LORAZEPAM 0.5 MG PO TABS: ORAL_TABLET | ORAL | 0 refills | Status: DC

## 2023-10-10 ENCOUNTER — Non-Acute Institutional Stay (SKILLED_NURSING_FACILITY): Payer: PPO | Admitting: Sports Medicine

## 2023-10-10 ENCOUNTER — Encounter: Payer: Self-pay | Admitting: Sports Medicine

## 2023-10-10 DIAGNOSIS — F411 Generalized anxiety disorder: Secondary | ICD-10-CM

## 2023-10-10 DIAGNOSIS — M159 Polyosteoarthritis, unspecified: Secondary | ICD-10-CM

## 2023-10-10 DIAGNOSIS — Z515 Encounter for palliative care: Secondary | ICD-10-CM | POA: Diagnosis not present

## 2023-10-10 DIAGNOSIS — G309 Alzheimer's disease, unspecified: Secondary | ICD-10-CM | POA: Diagnosis not present

## 2023-10-10 DIAGNOSIS — F02818 Dementia in other diseases classified elsewhere, unspecified severity, with other behavioral disturbance: Secondary | ICD-10-CM

## 2023-10-10 DIAGNOSIS — E039 Hypothyroidism, unspecified: Secondary | ICD-10-CM | POA: Diagnosis not present

## 2023-10-10 NOTE — Progress Notes (Signed)
Location:   [facility] Room Number: 110-A Place of Service:  SNF (31) Provider:  Oletta Lamas   PCP: Mast, Man X, NP  Patient Care Team: Mast, Man X, NP as PCP - General (Internal Medicine) Quintella Reichert, MD as PCP - Cardiology (Cardiology) Renford Dills, MD (Internal Medicine)  Extended Emergency Contact Information Primary Emergency Contact: Gertz,[name] Address: [address]          [place], [place] [address] Darden Amber of [place] Home Phone: [phone] Mobile Phone: [phone] Relation: Spouse Secondary Emergency Contact: Mauch,[name] Mobile Phone: [phone] Relation: Son  Code Status:  DNR Goals of care: Advanced Directive information    10/10/2023   12:49 PM  Advanced Directives  Does Patient Have a Medical Advance Directive? Yes  Type of Estate agent of [place];Living will;Out of facility DNR (pink MOST or yellow form)  Does patient want to make changes to medical advance directive? No - Patient declined  Copy of Healthcare Power of Attorney in Chart? Yes - validated most recent copy scanned in chart (See row information)     Chief Complaint  Patient presents with   Medical Management of Chronic Issues    Routine Visit.                            HOSPICE RELATED VISIT  HPI:  Pt is a 89 y.o. male seen today for medical management of chronic diseases.     Pt is on hospice Pt seen and examined in the living room, he seems pleasant and comfortable and does not appears to be in pain  He knows his name As per staff he Answers questions by 1-2 word answers He told his name but then did not answer any of my questions. Follows commands intermittently   Mumbles to himself Had multiple falls Ambulates independently    TSH 2.42 mIU/L 0.40-4.50  Final HEMOGLOBIN A1c 6.0 % of total Hgb <5.7 H Final  COMPREHENSIVE METABOLIC PANEL GLUCOSE 142 mg/dL 53-66 H Final  Fasting reference  interval For someone without known diabetes, a glucose value >125 mg/dL indicates that they may have diabetes and this should be confirmed with a follow-up test. UREA NITROGEN (BUN) 23 mg/dL 4-40 Final CREATININE 3.47 mg/dL 4.25-9.56 H Final EGFR 46 mL/min/1.73 m2 > OR = 60 L Final BUN/CREATININE RATIO 16 (calc) 6-22 Final SODIUM 143 mmol/L 135-146 Final POTASSIUM 3.5 mmol/L 3.5-5.3 Final CHLORIDE 107 mmol/L 98-110 Final CARBON DIOXIDE 24 mmol/L 20-32 Final CALCIUM 8.5 mg/dL 3.8-75.6 L Final PROTEIN, TOTAL 5.5 g/dL 4.3-3.2 L Final ALBUMIN 3.3 g/dL 9.5-1.8 L Final GLOBULIN 2.2 g/dL (calc) 8.4-1.6 Final ALBUMIN/GLOBULIN RATIO 1.5 (calc) 1.0-2.5 Final BILIRUBIN, TOTAL 0.5 mg/dL 6.0-6.3 Final ALKALINE PHOSPHATASE 67 U/L 35-144 Final AST 9 U/L 10-35 L Final ALT 14 U/L 9-46 Final  CBC (INCLUDES DIFF/PLT) WHITE BLOOD CELL COUNT 9.6 Thousand/u L 3.8-10.8 Final RED BLOOD CELL COUNT 5.83 Million/uL 4.20-5.80 H Final HEMOGLOBIN 16.1 g/dL 01.6-01.0 Final HEMATOCRIT 49.6 % 38.5-50.0 Final MCV 85.1 fL 80.0-100.0 Final    Past Medical History:  Diagnosis Date   Allergic rhinitis, cause unspecified    BPH (benign prostatic hyperplasia)    Carotid artery stenosis, asymptomatic    1-39% by dopplers 09/2016 followed by Dr. Darrick Penna   Cataracts, bilateral    Chronic kidney disease (CKD), stage III (moderate) (HCC)    Complete heart block (HCC) 01/23/04   s/p Medtronic PPM implanted by Dr Amil Amen  COPD (chronic obstructive pulmonary disease) (HCC)    Diabetes mellitus    Glaucoma    Hyperlipidemia    Hypertension    Insomnia due to medical condition 08/10/2014   Kidney stones    PAF (paroxysmal atrial fibrillation) (HCC) 10/09/2014   Noted on pacer check with 88 mode switches and longest episode >1 hour.  Now on Apixiban for CHADS2VASC score of 5   Past Surgical History:  Procedure Laterality Date   PACEMAKER INSERTION  2005, 04/16/12   MDT implanted by Dr Amil Amen with generator chnage  (MDT Adapta L) by Dr Johney Frame 04/16/12   PERMANENT PACEMAKER GENERATOR CHANGE N/A 04/16/2012   Procedure: PERMANENT PACEMAKER GENERATOR CHANGE;  Surgeon: Hillis Range, MD;  Location: [facility] CATH LAB;  Service: Cardiovascular;  Laterality: N/A;   RIGHT/LEFT HEART CATH AND CORONARY ANGIOGRAPHY N/A 10/18/2020   Procedure: RIGHT/LEFT HEART CATH AND CORONARY ANGIOGRAPHY;  Surgeon: Lennette Bihari, MD;  Location: [facility] INVASIVE CV LAB;  Service: Cardiovascular;  Laterality: N/A;    Allergies  Allergen Reactions   Penicillins Rash and Other (See Comments)    Childhood allergy    Irbesartan     Per [facility]   Penicillin G Benzathine Rash    Allergies as of 10/10/2023       Reactions   Penicillins Rash, Other (See Comments)   Childhood allergy    Irbesartan    Per [facility]   Penicillin G Benzathine Rash        Medication List        Accurate as of October 10, 2023 12:49 PM. If you have any questions, ask your nurse or doctor.          acetaminophen 325 MG tablet Commonly known as: TYLENOL Take 650 mg by mouth 2 (two) times daily.   acetaminophen 325 MG tablet Commonly known as: TYLENOL Take 2 tablets (650 mg total) by mouth every 6 (six) hours as needed for mild pain or headache.   cholestyramine 4 g packet Commonly known as: QUESTRAN Take 4 g by mouth 2 (two) times daily.   dextromethorphan-guaiFENesin 10-100 MG/5ML liquid Commonly known as: ROBITUSSIN-DM Take 10 mLs by mouth every 6 (six) hours as needed for cough.   divalproex 125 MG capsule Commonly known as: DEPAKOTE SPRINKLE Take 250 mg by mouth 3 (three) times daily.   escitalopram 10 MG tablet Commonly known as: LEXAPRO Take 10 mg by mouth daily.   ipratropium-albuterol 0.5-2.5 (3) MG/3ML Soln Commonly known as: DUONEB Take 3 mLs by nebulization every 8 (eight) hours as needed.   levothyroxine 100 MCG tablet Commonly known as: SYNTHROID Take 100 mcg by mouth daily before breakfast.   LORazepam 0.5 MG tablet Commonly known  as: ATIVAN Give 1 tablet daily on Monday and Wednesday   nitroGLYCERIN 0.4 MG SL tablet Commonly known as: NITROSTAT Place 1 tablet (0.4 mg total) under the tongue every 5 (five) minutes as needed for chest pain.   OXYGEN 2lpm as needed for SOB   saccharomyces boulardii 250 MG capsule Commonly known as: FLORASTOR Take 250 mg by mouth 2 (two) times daily.   senna 8.6 MG Tabs tablet Commonly known as: SENOKOT Take 2 tablets by mouth as needed.   senna 8.6 MG Tabs tablet Commonly known as: SENOKOT Take 2 tablets by mouth every evening.        Review of Systems  Unable to perform ROS: Dementia  Constitutional:  Negative for fever.  Respiratory:  Negative for cough.   Cardiovascular:  Negative for  leg swelling.  Gastrointestinal:  Negative for blood in stool, constipation, diarrhea and vomiting.  Neurological:  Negative for dizziness.  Psychiatric/Behavioral:  Negative for confusion. The patient is nervous/anxious.     Immunization History  Administered Date(s) Administered   Fluad Quad(high Dose 65+) 08/05/2023   Influenza-Unspecified 07/04/2014, 08/05/2022, 08/05/2023   Moderna Covid-19 Vaccine Bivalent Booster 39yrs & up 07/18/2022   Moderna SARS-COV2 Booster Vaccination 07/25/2020, 02/13/2021   Moderna Sars-Covid-2 Vaccination 09/20/2019, 10/18/2019   PNEUMOCOCCAL CONJUGATE-20 08/07/2022   Pfizer Covid-19 Vaccine Bivalent Booster 68yrs & up 06/20/2021, 08/19/2023   Respiratory Syncytial Virus Vaccine,Recomb Aduvanted(Arexvy) 10/04/2022   Tdap 10/28/2022   Zoster Recombinant(Shingrix) 11/06/2017, 03/28/2018   Pertinent  Health Maintenance Due  Topic Date Due   FOOT EXAM  10/19/2023   HEMOGLOBIN A1C  02/04/2024   INFLUENZA VACCINE  Completed   OPHTHALMOLOGY EXAM  Discontinued      07/05/2022    4:50 PM 10/18/2022   11:21 AM 11/04/2022    2:33 PM 11/15/2022    8:54 AM 12/26/2022   11:29 AM  Fall Risk  Falls in the past year? 0 0 0 0 0  Was there an injury with  Fall? 0 0 0 0 0  Fall Risk Category Calculator 0 0 0 0 0  Fall Risk Category (Retired) Low      (RETIRED) Patient Fall Risk Level High fall risk      Patient at Risk for Falls Due to History of fall(s) No Fall Risks  No Fall Risks No Fall Risks  Fall risk Follow up Falls evaluation completed Falls evaluation completed Falls evaluation completed Falls evaluation completed Falls evaluation completed   Functional Status Survey:    Vitals:   10/10/23 1244  BP: 118/70  Pulse: 68  Resp: 18  Temp: (!) 97.4 F (36.3 C)  SpO2: 96%  Weight: 137 lb 9.6 oz (62.4 kg)  Height: 5\' 6"  (1.676 m)   Body mass index is 22.21 kg/m. Physical Exam Constitutional:      Appearance: Normal appearance.  HENT:     Head: Normocephalic and atraumatic.  Cardiovascular:     Rate and Rhythm: Normal rate and regular rhythm.     Pulses: Normal pulses.     Heart sounds: Normal heart sounds.  Pulmonary:     Effort: No respiratory distress.     Breath sounds: No wheezing or rales.  Abdominal:     General: Bowel sounds are normal. There is no distension.     Tenderness: There is no abdominal tenderness. There is no guarding.  Musculoskeletal:        General: No swelling.  Neurological:     Mental Status: He is alert. Mental status is at baseline.     Motor: No weakness.     Labs reviewed: Recent Labs    01/04/23 0000 05/16/23 0000  NA 140 143  K 3.9 3.5  CL 106 107  CO2 21 24*  BUN 48* 23*  CREATININE 1.7* 1.5*  CALCIUM 8.9 8.5*   Recent Labs    05/16/23 0000  AST 9*  ALT 14  ALKPHOS 67  ALBUMIN 3.3*   Recent Labs    01/04/23 0000 05/16/23 0000  WBC 7.0 9.6  NEUTROABS  --  5,885.00  HGB 11.7* 16.1  HCT 35* 50  PLT 168 286   Lab Results  Component Value Date   TSH 2.42 09/30/2023   Lab Results  Component Value Date   HGBA1C 6.0 08/07/2023   Lab Results  Component Value Date   CHOL 128 12/16/2016   HDL 55 12/16/2016   LDLCALC 58 12/16/2016   TRIG 97 06/29/2022    CHOLHDL 2.3 12/16/2016    Significant Diagnostic Results in last 30 days:  No results found.  Assessment/Plan  1. 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 Difficulty in traveling to new locations. Decreased organizational capacity*  4 - Decreased ability to perform complex tasks (e.g., planning dinner for guests, handling. personal finances, difficulty marketing, etc.)  5 - Requires assistance in choosing proper clothing to wear for the day, season, or occasion (e.g., a patient may wear the same clothing repeatedly unless supervised)*  6a - Improperly putting on clothes without assistance or prompting (e.g., may put street clothes on overnight clothes, put shoes on wrong feet, or have difficulty buttoning clothing) occasionally or more frequently over the past weeks*  6b - Unable to bathe properly (e.g., difficulty adjusting bathwater temp.) occasionally or more frequently over the past weeks*  6c - Inability to handle mechanics of toileting (e.g., forgets to flush the toilet, does not wipe properly or properly dispose of toilet tissue) occasionally or more frequently over the past weeks*  6d - Urinary incontinence occasionally or more frequently over the past weeks*  6e - Fecal incontinence occasionally or more frequently over the past weeks*  7a - Ability to speak limited to approximately a half-dozen different intelligible words or fewer in an average day or the course of an intensive interview  7b - Speech ability is limited to using a single intelligible word on an average day or in an intensive interview (the person may repeat the word over and over)  7c -  Ambulatory ability is lost (cannot walk without personal assistance)  7d - Cannot sit up without assistance  7e - Loss of ability to smile  70f - Loss of ability to hold head up independently  Functional Assessment Staging Scale: 7c - Ambulatory ability is lost (cannot walk without personal assistance).   2. GAD (generalized anxiety disorder) Cont with lexapro  3. Hospice care Pt does not seem to be in pain  Cont with tylenol for pain   4. Hypothyroidism, unspecified type Cont with synthyroid   5. Osteoarthritis of multiple joints, unspecified osteoarthritis type Cont with tylenol bid  Other orders - TSH - senna (SENOKOT) 8.6 MG TABS tablet; Take 2 tablets by mouth every evening.   30 min Total time spent for obtaining history,  performing a medically appropriate examination and evaluation, reviewing the tests,  documenting clinical information in the electronic or other health record ,care coordination (not separately reported)

## 2023-10-17 ENCOUNTER — Encounter: Payer: Self-pay | Admitting: Sports Medicine

## 2023-10-17 ENCOUNTER — Non-Acute Institutional Stay (SKILLED_NURSING_FACILITY): Payer: PPO | Admitting: Sports Medicine

## 2023-10-17 DIAGNOSIS — W19XXXA Unspecified fall, initial encounter: Secondary | ICD-10-CM | POA: Diagnosis not present

## 2023-10-17 DIAGNOSIS — R0981 Nasal congestion: Secondary | ICD-10-CM | POA: Diagnosis not present

## 2023-10-17 DIAGNOSIS — Y92129 Unspecified place in nursing home as the place of occurrence of the external cause: Secondary | ICD-10-CM | POA: Diagnosis not present

## 2023-10-17 NOTE — Progress Notes (Unsigned)
Location:  [facility] Room Number: N110-A Place of Service:  SNF (31) Provider:  Willey Blade, MD   Patient Care Team: Mast, Man X, NP as PCP - General (Internal Medicine) Quintella Reichert, MD as PCP - Cardiology (Cardiology) Renford Dills, MD (Internal Medicine)  Extended Emergency Contact Information Primary Emergency Contact: Orzechowski,[name] Address: [address]          [place], [place] [address] [place] of [place] Home Phone: [phone] Mobile Phone: [phone] Relation: Spouse Secondary Emergency Contact: Seavey,[name] Mobile Phone: [phone] Relation: Son  Code Status:  DNR Goals of care: Advanced Directive information    10/17/2023   10:33 AM  Advanced Directives  Does Patient Have a Medical Advance Directive? Yes  Type of Estate agent of [place];Living will;Out of facility DNR (pink MOST or yellow form)  Does patient want to make changes to medical advance directive? No - Patient declined  Copy of Healthcare Power of Attorney in Chart? Yes - validated most recent copy scanned in chart (See row information)     Chief Complaint  Patient presents with  . Acute Visit    Fall    HPI:  Pt is a 89 y.o. male seen today for an acute visit for witnessed fall  As per staff pt lost his balance and fell on the floor, he was able to get up on his own. Ambulates independently  Pt seen and examined in the living room  He has a book in his hand knows his name  but does not participate much in the conversation  Does not follow commands   As per staff pt is congested mostly in the morning Denies fevers, cough, SOB       Past Medical History:  Diagnosis Date  . Allergic rhinitis, cause unspecified   . BPH (benign prostatic hyperplasia)   . Carotid artery stenosis, asymptomatic    1-39% by dopplers 09/2016 followed by Dr. Darrick Penna  . Cataracts, bilateral   . Chronic kidney disease (CKD), stage III  (moderate) (HCC)   . Complete heart block (HCC) 01/23/04   s/p Medtronic PPM implanted by Dr Amil Amen  . COPD (chronic obstructive pulmonary disease) (HCC)   . Diabetes mellitus   . Glaucoma   . Hyperlipidemia   . Hypertension   . 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 Kidney stones   . PAF (paroxysmal atrial fibrillation) (HCC) 10/09/2014   Noted on pacer check with 88 mode switches and longest episode >1 hour.  Now on Apixiban for CHADS2VASC score of 5   Past Surgical History:  Procedure Laterality Date  . PACEMAKER INSERTION  2005, 04/16/12   MDT implanted by Dr Amil Amen with generator chnage (MDT Adapta L) by Dr Johney Frame 04/16/12  . PERMANENT PACEMAKER GENERATOR CHANGE N/A 04/16/2012   Procedure: PERMANENT PACEMAKER GENERATOR CHANGE;  Surgeon: Hillis Range, MD;  Location: [facility] CATH LAB;  Service: Cardiovascular;  Laterality: N/A;  . RIGHT/LEFT HEART CATH AND CORONARY ANGIOGRAPHY N/A 10/18/2020   Procedure: RIGHT/LEFT HEART CATH AND CORONARY ANGIOGRAPHY;  Surgeon: Lennette Bihari, MD;  Location: [facility] INVASIVE CV LAB;  Service: Cardiovascular;  Laterality: N/A;    Allergies  Allergen Reactions  . Penicillins Rash and Other (See Comments)    Childhood allergy   . Irbesartan     Per [facility]  . Penicillin G Benzathine Rash    Outpatient Encounter Medications as of 10/17/2023  Medication Sig  . acetaminophen (TYLENOL) 325 MG tablet Take 2 tablets (  650 mg total) by mouth every 6 (six) hours as needed for mild pain or headache.  [name] Kitchen acetaminophen (TYLENOL) 325 MG tablet Take 650 mg by mouth 2 (two) times daily.  . cholestyramine (QUESTRAN) 4 g packet Take 4 g by mouth 2 (two) times daily.  [name] Kitchen dextromethorphan-guaiFENesin (ROBITUSSIN-DM) 10-100 MG/5ML liquid Take 10 mLs by mouth every 6 (six) hours as needed for cough.  . divalproex (DEPAKOTE SPRINKLE) 125 MG capsule Take 250 mg by mouth 3 (three) times daily.  [name] Kitchen escitalopram (LEXAPRO) 10 MG tablet Take 10 mg by mouth daily.  [name] Kitchen ipratropium-albuterol  (DUONEB) 0.5-2.5 (3) MG/3ML SOLN Take 3 mLs by nebulization every 8 (eight) hours as needed.  [name] Kitchen levothyroxine (SYNTHROID) 100 MCG tablet Take 100 mcg by mouth daily before breakfast.  . LORazepam (ATIVAN) 0.5 MG tablet Give 1 tablet daily on Monday and Wednesday  . nitroGLYCERIN (NITROSTAT) 0.4 MG SL tablet Place 1 tablet (0.4 mg total) under the tongue every 5 (five) minutes as needed for chest pain.  . OXYGEN 2lpm as needed for SOB  . saccharomyces boulardii (FLORASTOR) 250 MG capsule Take 250 mg by mouth 2 (two) times daily.  [name] Kitchen senna (SENOKOT) 8.6 MG TABS tablet Take 2 tablets by mouth as needed.  . senna (SENOKOT) 8.6 MG TABS tablet Take 2 tablets by mouth every evening.   No facility-administered encounter medications on file as of 10/17/2023.    Review of Systems  Unable to perform ROS: Dementia    Immunization History  Administered Date(s) Administered  . Fluad Quad(high Dose 65+) 08/05/2023  . Influenza-Unspecified 07/04/2014, 08/05/2022, 08/05/2023  . Moderna Covid-19 Vaccine Bivalent Booster 42yrs & up 07/18/2022  . Moderna SARS-COV2 Booster Vaccination 07/25/2020, 02/13/2021  . Moderna Sars-Covid-2 Vaccination 09/20/2019, 10/18/2019  . PNEUMOCOCCAL CONJUGATE-20 08/07/2022  . Research officer, trade [employer] 72yrs & up 06/20/2021, 08/19/2023  . Respiratory Syncytial Virus Vaccine,Recomb Aduvanted(Arexvy) 10/04/2022  . Tdap 10/28/2022  . Zoster Recombinant(Shingrix) 11/06/2017, 03/28/2018   Pertinent  Health Maintenance Due  Topic Date Due  . FOOT EXAM  10/19/2023  . HEMOGLOBIN A1C  02/04/2024  . INFLUENZA VACCINE  Completed  . OPHTHALMOLOGY EXAM  Discontinued      07/05/2022    4:50 PM 10/18/2022   11:21 AM 11/04/2022    2:33 PM 11/15/2022    8:54 AM 12/26/2022   11:29 AM  Fall Risk  Falls in the past year? 0 0 0 0 0  Was there an injury with Fall? 0 0 0 0 0  Fall Risk Category Calculator 0 0 0 0 0  Fall Risk Category (Retired) Low      (RETIRED) Patient  Fall Risk Level High fall risk      Patient at Risk for Falls Due to History of fall(s) No Fall Risks  No Fall Risks No Fall Risks  Fall risk Follow up Falls evaluation completed Falls evaluation completed Falls evaluation completed Falls evaluation completed Falls evaluation completed   Functional Status Survey:    Vitals:   10/17/23 1032  BP: 136/66  Pulse: 64  Temp: (!) 97.4 F (36.3 C)  SpO2: 96%  Weight: 137 lb 9.6 oz (62.4 kg)  Height: 5\' 6"  (1.676 m)   Body mass index is 22.21 kg/m. Physical Exam Constitutional:      Appearance: Normal appearance.  HENT:     Head: Normocephalic and atraumatic.  Cardiovascular:     Rate and Rhythm: Normal rate and regular rhythm.     Pulses: Normal pulses.  Heart sounds: Normal heart sounds.  Pulmonary:     Effort: No respiratory distress.     Breath sounds: No stridor. No wheezing or rales.  Abdominal:     General: Bowel sounds are normal. There is no distension.     Palpations: Abdomen is soft.     Tenderness: There is no abdominal tenderness. There is no right CVA tenderness or guarding.  Musculoskeletal:        General: No swelling.  Neurological:     Mental Status: He is alert. Mental status is at baseline.     Motor: No weakness.    Labs reviewed: Recent Labs    01/04/23 0000 05/16/23 0000  NA 140 143  K 3.9 3.5  CL 106 107  CO2 21 24*  BUN 48* 23*  CREATININE 1.7* 1.5*  CALCIUM 8.9 8.5*   Recent Labs    05/16/23 0000  AST 9*  ALT 14  ALKPHOS 67  ALBUMIN 3.3*   Recent Labs    01/04/23 0000 05/16/23 0000  WBC 7.0 9.6  NEUTROABS  --  5,885.00  HGB 11.7* 16.1  HCT 35* 50  PLT 168 286   Lab Results  Component Value Date   TSH 2.42 09/30/2023   Lab Results  Component Value Date   HGBA1C 6.0 08/07/2023   Lab Results  Component Value Date   CHOL 128 12/16/2016   HDL 55 12/16/2016   LDLCALC 58 12/16/2016   TRIG 97 06/29/2022   CHOLHDL 2.3 12/16/2016    Significant Diagnostic Results in  last 30 days:  No results found.  Assessment/Plan  1. Fall at nursing home, initial encounter (Primary) Cont with fall precautions Pt denies pain  Bed at lowest position  Cont with neuro checks   2. Chronic nasal congestion Staff reported that pt is congested mostly in the mornings and constant dripping from his nose He is a poor historian       Family/ staff Communication: ***  Labs/tests ordered:  ***

## 2023-10-22 ENCOUNTER — Encounter: Payer: Self-pay | Admitting: Nurse Practitioner

## 2023-10-22 ENCOUNTER — Non-Acute Institutional Stay (SKILLED_NURSING_FACILITY): Payer: PPO | Admitting: Nurse Practitioner

## 2023-10-22 ENCOUNTER — Encounter: Payer: Self-pay | Admitting: Sports Medicine

## 2023-10-22 DIAGNOSIS — N1831 Chronic kidney disease, stage 3a: Secondary | ICD-10-CM

## 2023-10-22 DIAGNOSIS — W19XXXA Unspecified fall, initial encounter: Secondary | ICD-10-CM

## 2023-10-22 DIAGNOSIS — I10 Essential (primary) hypertension: Secondary | ICD-10-CM | POA: Diagnosis not present

## 2023-10-22 DIAGNOSIS — R627 Adult failure to thrive: Secondary | ICD-10-CM

## 2023-10-22 DIAGNOSIS — F0394 Unspecified dementia, unspecified severity, with anxiety: Secondary | ICD-10-CM

## 2023-10-22 DIAGNOSIS — E039 Hypothyroidism, unspecified: Secondary | ICD-10-CM | POA: Diagnosis not present

## 2023-10-22 DIAGNOSIS — E0849 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: Secondary | ICD-10-CM | POA: Diagnosis not present

## 2023-10-22 DIAGNOSIS — I480 Paroxysmal atrial fibrillation: Secondary | ICD-10-CM

## 2023-10-22 NOTE — Assessment & Plan Note (Signed)
 Bun/creat 23/1.5 05/16/23

## 2023-10-22 NOTE — Assessment & Plan Note (Signed)
 Occasional elevated Sbp in 150s, off meds.

## 2023-10-22 NOTE — Assessment & Plan Note (Signed)
 on diet, off FArxiga, Hgb a1c 6.0 08/07/23

## 2023-10-22 NOTE — Assessment & Plan Note (Signed)
 taking Levothyroxine, TSH 2.41 09/30/23

## 2023-10-22 NOTE — Progress Notes (Signed)
 Location:   [facility] Room Number: 110 Place of Service:  SNF (31) Provider: Larwance Eleny Cortez NP  Siyon Linck X, NP  Patient Care Team: Constance Hackenberg X, NP as PCP - General (Internal Medicine) Shlomo Wilbert SAUNDERS, MD as PCP - Cardiology (Cardiology) Rexanne Ingle, MD (Internal Medicine)  Extended Emergency Contact Information Primary Emergency Contact: [facility] Address: [address]          [place], [place] [address] [place] Home Phone: [phone] Mobile Phone: [phone] Relation: Spouse Secondary Emergency Contact: Breth,[name] Mobile Phone: [phone] Relation: Son  Code Status:  DNR Goals of care: Advanced Directive information    10/17/2023   10:33 AM  Advanced Directives  Does Patient Have a Medical Advance Directive? Yes  Type of Estate Agent of [place];Living will;Out of facility DNR (pink MOST or yellow form)  Does patient want to make changes to medical advance directive? No - Patient declined  Copy of Healthcare Power of Attorney in Chart? Yes - validated most recent copy scanned in chart (See row information)  Pre-existing out of facility DNR order (yellow form or pink MOST form) Yellow form placed in chart (order not valid for inpatient use);Pink MOST form placed in chart (order not valid for inpatient use)     Chief Complaint  Patient presents with   Medical Management of Chronic Issues    HPI:  Pt is a 89 y.o. male seen today for medical management of chronic diseases.     Depression/anxiety, on Lexapro, Depakote , Lorazepam ,  stabilizing mood Dementia, resides in Memory care unit  Frequent falls, contributory factors: increased frailty, gait issues, and poor safety awareness             Adult Failure To Thrive, weight loss-stable, followed by dietary             Afib followed by Cardiology, heart rate is in control, off  Eliquis , Hgb 16.1 05/16/23             CAD, off Statin, on prn NTG              CHFrEF, off Farxiga , EF 30-35%, compensated clinically.              CHB s/p PPM             T2DM, on diet, off FArxiga , Hgb a1c 6.0 08/07/23             Hypothyroidism, taking Levothyroxine , TSH 2.41 09/30/23             Hyperlipidemia, off  Atorvastatin , LDL 60 06/13/22             HTN, off Valsartan              CKD chronic, Bun/creat 23/1.5 05/16/23  Past Medical History:  Diagnosis Date   Allergic rhinitis, cause unspecified    BPH (benign prostatic hyperplasia)    Carotid artery stenosis, asymptomatic    1-39% by dopplers 09/2016 followed by Dr. Harvey   Cataracts, bilateral    Chronic kidney disease (CKD), stage III (moderate) (HCC)    Complete heart block (HCC) 01/23/04   s/p Medtronic PPM implanted by Dr Malva   COPD (chronic obstructive pulmonary disease) (HCC)    Diabetes mellitus    Glaucoma    Hyperlipidemia    Hypertension    Insomnia due to medical condition 08/10/2014   Kidney stones    PAF (paroxysmal atrial fibrillation) (HCC) 10/09/2014   Noted on  pacer check with 88 mode switches and longest episode >1 hour.  Now on Apixiban for CHADS2VASC score of 5   Past Surgical History:  Procedure Laterality Date   PACEMAKER INSERTION  2005, 04/16/12   MDT implanted by Dr Malva with generator chnage (MDT Adapta L) by Dr Kelsie 04/16/12   PERMANENT PACEMAKER GENERATOR CHANGE N/A 04/16/2012   Procedure: PERMANENT PACEMAKER GENERATOR CHANGE;  Surgeon: Lynwood Kelsie, MD;  Location: [facility] CATH LAB;  Service: Cardiovascular;  Laterality: N/A;   RIGHT/LEFT HEART CATH AND CORONARY ANGIOGRAPHY N/A 10/18/2020   Procedure: RIGHT/LEFT HEART CATH AND CORONARY ANGIOGRAPHY;  Surgeon: Burnard Debby LABOR, MD;  Location: [facility] INVASIVE CV LAB;  Service: Cardiovascular;  Laterality: N/A;    Allergies  Allergen Reactions   Penicillins Rash and Other (See Comments)    Childhood allergy    Irbesartan     Per [facility]   Penicillin G Benzathine Rash    Allergies as of 10/22/2023       Reactions   Penicillins  Rash, Other (See Comments)   Childhood allergy    Irbesartan    Per [facility]   Penicillin G Benzathine Rash        Medication List        Accurate as of October 22, 2023 11:59 PM. If you have any questions, ask your nurse or doctor.          acetaminophen  325 MG tablet Commonly known as: TYLENOL  Take 650 mg by mouth 2 (two) times daily.   acetaminophen  325 MG tablet Commonly known as: TYLENOL  Take 2 tablets (650 mg total) by mouth every 6 (six) hours as needed for mild pain or headache.   cholestyramine 4 g packet Commonly known as: QUESTRAN Take 4 g by mouth 2 (two) times daily.   dextromethorphan-guaiFENesin 10-100 MG/5ML liquid Commonly known as: ROBITUSSIN-DM Take 10 mLs by mouth every 6 (six) hours as needed for cough.   divalproex  125 MG capsule Commonly known as: DEPAKOTE  SPRINKLE Take 250 mg by mouth 3 (three) times daily.   escitalopram 10 MG tablet Commonly known as: LEXAPRO Take 10 mg by mouth daily.   ipratropium-albuterol 0.5-2.5 (3) MG/3ML Soln Commonly known as: DUONEB Take 3 mLs by nebulization every 8 (eight) hours as needed.   levothyroxine  100 MCG tablet Commonly known as: SYNTHROID  Take 100 mcg by mouth daily before breakfast.   LORazepam  0.5 MG tablet Commonly known as: ATIVAN  Give 1 tablet daily on Monday and Wednesday   nitroGLYCERIN  0.4 MG SL tablet Commonly known as: NITROSTAT  Place 1 tablet (0.4 mg total) under the tongue every 5 (five) minutes as needed for chest pain.   OXYGEN 2lpm as needed for SOB   saccharomyces boulardii 250 MG capsule Commonly known as: FLORASTOR Take 250 mg by mouth 2 (two) times daily.   senna 8.6 MG Tabs tablet Commonly known as: SENOKOT Take 2 tablets by mouth as needed.   senna 8.6 MG Tabs tablet Commonly known as: SENOKOT Take 2 tablets by mouth every evening.        Review of Systems  Unable to perform ROS: Dementia    Immunization History  Administered Date(s) Administered   Fluad  Quad(high Dose 65+) 08/05/2023   Influenza-Unspecified 07/04/2014, 08/05/2022, 08/05/2023   Moderna Covid-19 Vaccine Bivalent Booster 59yrs & up 07/18/2022   Moderna SARS-COV2 Booster Vaccination 07/25/2020, 02/13/2021   Moderna Sars-Covid-2 Vaccination 09/20/2019, 10/18/2019   PNEUMOCOCCAL CONJUGATE-20 08/07/2022   Pfizer Covid-19 Vaccine Bivalent Booster 73yrs & up 06/20/2021, 08/19/2023  Respiratory Syncytial Virus Vaccine,Recomb Aduvanted(Arexvy) 10/04/2022   Tdap 10/28/2022   Zoster Recombinant(Shingrix) 11/06/2017, 03/28/2018   Pertinent  Health Maintenance Due  Topic Date Due   FOOT EXAM  10/19/2023   HEMOGLOBIN A1C  02/04/2024   INFLUENZA VACCINE  Completed   OPHTHALMOLOGY EXAM  Discontinued      07/05/2022    4:50 PM 10/18/2022   11:21 AM 11/04/2022    2:33 PM 11/15/2022    8:54 AM 12/26/2022   11:29 AM  Fall Risk  Falls in the past year? 0 0 0 0 0  Was there an injury with Fall? 0 0 0 0 0  Fall Risk Category Calculator 0 0 0 0 0  Fall Risk Category (Retired) Low      (RETIRED) Patient Fall Risk Level High fall risk      Patient at Risk for Falls Due to History of fall(s) No Fall Risks  No Fall Risks No Fall Risks  Fall risk Follow up Falls evaluation completed Falls evaluation completed Falls evaluation completed Falls evaluation completed Falls evaluation completed   Functional Status Survey:    Vitals:   10/22/23 1338 10/23/23 1044  BP: (!) 153/79 (!) 153/79  Pulse: 65   Resp: 18   Temp: 98 F (36.7 C)   SpO2: 96%   Weight: 132 lb 9.6 oz (60.1 kg)    Body mass index is 21.4 kg/m. Physical Exam Vitals and nursing note reviewed.  Constitutional:      Appearance: Normal appearance.  HENT:     Head: Normocephalic and atraumatic.     Nose: Nose normal.     Mouth/Throat:     Mouth: Mucous membranes are moist.  Eyes:     Extraocular Movements: Extraocular movements intact.     Conjunctiva/sclera: Conjunctivae normal.     Pupils: Pupils are equal, round,  and reactive to light.  Cardiovascular:     Rate and Rhythm: Normal rate and regular rhythm.     Heart sounds: No murmur heard. Pulmonary:     Effort: Pulmonary effort is normal.     Breath sounds: No rales.  Abdominal:     General: Bowel sounds are normal.     Palpations: Abdomen is soft.     Tenderness: There is no abdominal tenderness.     Comments: Loose stools 2-3x/day  Genitourinary:    Comments: External hemorrhoids from previous examination.  Musculoskeletal:        General: No tenderness.     Cervical back: Normal range of motion and neck supple.     Right lower leg: No edema.     Left lower leg: No edema.     Comments: Able to stand up with assistance w/o pain noted  Skin:    General: Skin is warm and dry.  Neurological:     General: No focal deficit present.     Mental Status: He is alert. Mental status is at baseline.     Gait: Gait abnormal.     Comments: Oriented to person  Psychiatric:        Mood and Affect: Mood normal.        Behavior: Behavior normal.     Comments: Followed simple directions     Labs reviewed: Recent Labs    01/04/23 0000 05/16/23 0000  NA 140 143  K 3.9 3.5  CL 106 107  CO2 21 24*  BUN 48* 23*  CREATININE 1.7* 1.5*  CALCIUM  8.9 8.5*   Recent Labs  05/16/23 0000  AST 9*  ALT 14  ALKPHOS 67  ALBUMIN 3.3*   Recent Labs    01/04/23 0000 05/16/23 0000  WBC 7.0 9.6  NEUTROABS  --  5,885.00  HGB 11.7* 16.1  HCT 35* 50  PLT 168 286   Lab Results  Component Value Date   TSH 2.42 09/30/2023   Lab Results  Component Value Date   HGBA1C 6.0 08/07/2023   Lab Results  Component Value Date   CHOL 128 12/16/2016   HDL 55 12/16/2016   LDLCALC 58 12/16/2016   TRIG 97 06/29/2022   CHOLHDL 2.3 12/16/2016    Significant Diagnostic Results in last 30 days:  No results found.  Assessment/Plan  Hypothyroidism taking Levothyroxine , TSH 2.41 09/30/23  Hypertension Occasional elevated Sbp in 150s, off meds.    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   Labs/tests ordered:  none

## 2023-10-22 NOTE — Assessment & Plan Note (Signed)
 contributory factors: increased frailty, gait issues, and poor safety awareness

## 2023-10-22 NOTE — Assessment & Plan Note (Signed)
weight loss-stable, followed by dietary

## 2023-10-22 NOTE — Assessment & Plan Note (Signed)
on Lexapro, Depakote, Lorazepam,  stabilizing mood resides in Memory care unit

## 2023-10-22 NOTE — Assessment & Plan Note (Signed)
 Afib followed by Cardiology, heart rate is in control, off  Eliquis , Hgb 16.1 05/16/23             CAD, off Statin, on prn NTG             CHFrEF, off Farxiga , EF 30-35%, compensated clinically.              CHB s/p PPM

## 2023-11-24 ENCOUNTER — Non-Acute Institutional Stay (SKILLED_NURSING_FACILITY): Payer: Self-pay | Admitting: Nurse Practitioner

## 2023-11-24 ENCOUNTER — Encounter: Payer: Self-pay | Admitting: Nurse Practitioner

## 2023-11-24 DIAGNOSIS — I442 Atrioventricular block, complete: Secondary | ICD-10-CM

## 2023-11-24 DIAGNOSIS — I10 Essential (primary) hypertension: Secondary | ICD-10-CM

## 2023-11-24 DIAGNOSIS — E7800 Pure hypercholesterolemia, unspecified: Secondary | ICD-10-CM

## 2023-11-24 DIAGNOSIS — I480 Paroxysmal atrial fibrillation: Secondary | ICD-10-CM

## 2023-11-24 DIAGNOSIS — K5901 Slow transit constipation: Secondary | ICD-10-CM

## 2023-11-24 DIAGNOSIS — E039 Hypothyroidism, unspecified: Secondary | ICD-10-CM

## 2023-11-24 DIAGNOSIS — F0394 Unspecified dementia, unspecified severity, with anxiety: Secondary | ICD-10-CM | POA: Diagnosis not present

## 2023-11-24 DIAGNOSIS — E0849 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: Secondary | ICD-10-CM

## 2023-11-24 DIAGNOSIS — I5020 Unspecified systolic (congestive) heart failure: Secondary | ICD-10-CM

## 2023-11-24 DIAGNOSIS — N1831 Chronic kidney disease, stage 3a: Secondary | ICD-10-CM

## 2023-11-24 NOTE — Assessment & Plan Note (Signed)
 off  Atorvastatin, on Questran,  LDL 60 06/13/22

## 2023-11-24 NOTE — Assessment & Plan Note (Signed)
 taking Levothyroxine, TSH 2.41 09/30/23

## 2023-11-24 NOTE — Assessment & Plan Note (Addendum)
 off Statin, on prn NTG, s/p PPM

## 2023-11-24 NOTE — Assessment & Plan Note (Signed)
off Farxiga, EF 30-35%, compensated clinically

## 2023-11-24 NOTE — Assessment & Plan Note (Signed)
Stable, takes Senna 

## 2023-11-24 NOTE — Assessment & Plan Note (Signed)
 on diet, off FArxiga, Hgb a1c 6.0 08/07/23

## 2023-11-24 NOTE — Progress Notes (Signed)
 Location:   [facility] Room Number: 110 Place of Service:  SNF (31) Provider: Arna Snipe Dominick Morella NP  Nemiah Bubar X, NP  Patient Care Team: Paden Senger X, NP as PCP - General (Internal Medicine) Quintella Reichert, MD as PCP - Cardiology (Cardiology) Renford Dills, MD (Internal Medicine)  Extended Emergency Contact Information Primary Emergency Contact: Camposano,[name] Address: [address]          [place], [place] [address] [place] Home Phone: [phone] Mobile Phone: [phone] Relation: Spouse Secondary Emergency Contact: Lillibridge,[name] Mobile Phone: [phone] Relation: Son  Code Status:  DNR Goals of care: Advanced Directive information    10/17/2023   10:33 AM  Advanced Directives  Does Patient Have a Medical Advance Directive? Yes  Type of Estate agent of [place];Living will;Out of facility DNR (pink MOST or yellow form)  Does patient want to make changes to medical advance directive? No - Patient declined  Copy of Healthcare Power of Attorney in Chart? Yes - validated most recent copy scanned in chart (See row information)  Pre-existing out of facility DNR order (yellow form or pink MOST form) Yellow form placed in chart (order not valid for inpatient use);Pink MOST form placed in chart (order not valid for inpatient use)     Chief Complaint  Patient presents with   Medical Management of Chronic Issues    HPI:  Pt is a 89 y.o. male seen today for medical management of chronic diseases.    Constipation, takes Senna Depression/anxiety, on Lexapro, Depakote, Lorazepam,  stabilizing mood Dementia, resides in Memory care unit  Frequent falls, contributory factors: increased frailty, gait issues, and poor safety awareness             Adult Failure To Thrive, weight loss-stable, followed by dietary             Afib followed by Cardiology, heart rate is in control, off  Eliquis, Hgb 16.1 05/16/23             CAD, off Statin, on  prn NTG             CHFrEF, off Farxiga, EF 30-35%, compensated clinically.              CHB s/p PPM             T2DM, on diet, off FArxiga, Hgb a1c 6.0 08/07/23             Hypothyroidism, taking Levothyroxine, TSH 2.41 09/30/23             Hyperlipidemia, off  Atorvastatin, on Questran,  LDL 60 06/13/22             HTN, off Valsartan             CKD chronic, Bun/creat 23/1.5 05/16/23  Past Medical History:  Diagnosis Date   Allergic rhinitis, cause unspecified    BPH (benign prostatic hyperplasia)    Carotid artery stenosis, asymptomatic    1-39% by dopplers 09/2016 followed by Dr. Darrick Penna   Cataracts, bilateral    Chronic kidney disease (CKD), stage III (moderate) (HCC)    Complete heart block (HCC) 01/23/04   s/p Medtronic PPM implanted by Dr Amil Amen   COPD (chronic obstructive pulmonary disease) (HCC)    Diabetes mellitus    Glaucoma    Hyperlipidemia    Hypertension    Insomnia due to medical condition 08/10/2014   Kidney stones    PAF (paroxysmal atrial fibrillation) (HCC)  10/09/2014   Noted on pacer check with 88 mode switches and longest episode >1 hour.  Now on Apixiban for CHADS2VASC score of 5   Past Surgical History:  Procedure Laterality Date   PACEMAKER INSERTION  2005, 04/16/12   MDT implanted by Dr Amil Amen with generator chnage (MDT Adapta L) by Dr Johney Frame 04/16/12   PERMANENT PACEMAKER GENERATOR CHANGE N/A 04/16/2012   Procedure: PERMANENT PACEMAKER GENERATOR CHANGE;  Surgeon: Hillis Range, MD;  Location: [facility] CATH LAB;  Service: Cardiovascular;  Laterality: N/A;   RIGHT/LEFT HEART CATH AND CORONARY ANGIOGRAPHY N/A 10/18/2020   Procedure: RIGHT/LEFT HEART CATH AND CORONARY ANGIOGRAPHY;  Surgeon: Lennette Bihari, MD;  Location: [facility] INVASIVE CV LAB;  Service: Cardiovascular;  Laterality: N/A;    Allergies  Allergen Reactions   Penicillins Rash and Other (See Comments)    Childhood allergy    Irbesartan     Per [facility]   Penicillin G Benzathine Rash    Allergies as of 11/24/2023        Reactions   Penicillins Rash, Other (See Comments)   Childhood allergy    Irbesartan    Per [facility]   Penicillin G Benzathine Rash        Medication List        Accurate as of November 24, 2023  4:45 PM. If you have any questions, ask your nurse or doctor.          acetaminophen 325 MG tablet Commonly known as: TYLENOL Take 650 mg by mouth 2 (two) times daily.   acetaminophen 325 MG tablet Commonly known as: TYLENOL Take 2 tablets (650 mg total) by mouth every 6 (six) hours as needed for mild pain or headache.   cholestyramine 4 g packet Commonly known as: QUESTRAN Take 4 g by mouth 2 (two) times daily.   dextromethorphan-guaiFENesin 10-100 MG/5ML liquid Commonly known as: ROBITUSSIN-DM Take 10 mLs by mouth every 6 (six) hours as needed for cough.   divalproex 125 MG capsule Commonly known as: DEPAKOTE SPRINKLE Take 250 mg by mouth 3 (three) times daily.   escitalopram 10 MG tablet Commonly known as: LEXAPRO Take 10 mg by mouth daily.   ipratropium-albuterol 0.5-2.5 (3) MG/3ML Soln Commonly known as: DUONEB Take 3 mLs by nebulization every 8 (eight) hours as needed.   levothyroxine 100 MCG tablet Commonly known as: SYNTHROID Take 100 mcg by mouth daily before breakfast.   LORazepam 0.5 MG tablet Commonly known as: ATIVAN Give 1 tablet daily on Monday and Wednesday   nitroGLYCERIN 0.4 MG SL tablet Commonly known as: NITROSTAT Place 1 tablet (0.4 mg total) under the tongue every 5 (five) minutes as needed for chest pain.   OXYGEN 2lpm as needed for SOB   saccharomyces boulardii 250 MG capsule Commonly known as: FLORASTOR Take 250 mg by mouth 2 (two) times daily.   senna 8.6 MG Tabs tablet Commonly known as: SENOKOT Take 2 tablets by mouth as needed.   senna 8.6 MG Tabs tablet Commonly known as: SENOKOT Take 2 tablets by mouth every evening.        Review of Systems  Unable to perform ROS: Dementia    Immunization History  Administered  Date(s) Administered   Fluad Quad(high Dose 65+) 08/05/2023   Influenza-Unspecified 07/04/2014, 08/05/2022, 08/05/2023   Moderna Covid-19 Vaccine Bivalent Booster 8yrs & up 07/18/2022   Moderna SARS-COV2 Booster Vaccination 07/25/2020, 02/13/2021   Moderna Sars-Covid-2 Vaccination 09/20/2019, 10/18/2019   PNEUMOCOCCAL CONJUGATE-20 08/07/2022   Pfizer Covid-19 Vaccine Bivalent Booster 44yrs &  up 06/20/2021, 08/19/2023   Respiratory Syncytial Virus Vaccine,Recomb Aduvanted(Arexvy) 10/04/2022   Tdap 10/28/2022   Zoster Recombinant(Shingrix) 11/06/2017, 03/28/2018   Pertinent  Health Maintenance Due  Topic Date Due   FOOT EXAM  10/19/2023   HEMOGLOBIN A1C  02/04/2024   INFLUENZA VACCINE  Completed   OPHTHALMOLOGY EXAM  Discontinued      07/05/2022    4:50 PM 10/18/2022   11:21 AM 11/04/2022    2:33 PM 11/15/2022    8:54 AM 12/26/2022   11:29 AM  Fall Risk  Falls in the past year? 0 0 0 0 0  Was there an injury with Fall? 0 0 0 0 0  Fall Risk Category Calculator 0 0 0 0 0  Fall Risk Category (Retired) Low      (RETIRED) Patient Fall Risk Level High fall risk      Patient at Risk for Falls Due to History of fall(s) No Fall Risks  No Fall Risks No Fall Risks  Fall risk Follow up Falls evaluation completed Falls evaluation completed Falls evaluation completed Falls evaluation completed Falls evaluation completed   Functional Status Survey:    Vitals:   11/24/23 1635  BP: 107/72  Pulse: 66  Resp: 16  Temp: (!) 97.2 F (36.2 C)  SpO2: 96%  Weight: 145 lb (65.8 kg)   Body mass index is 23.4 kg/m. Physical Exam Vitals and nursing note reviewed.  Constitutional:      Appearance: Normal appearance.  HENT:     Head: Normocephalic and atraumatic.     Nose: Nose normal.     Mouth/Throat:     Mouth: Mucous membranes are moist.  Eyes:     Extraocular Movements: Extraocular movements intact.     Conjunctiva/sclera: Conjunctivae normal.     Pupils: Pupils are equal, round, and  reactive to light.  Cardiovascular:     Rate and Rhythm: Normal rate and regular rhythm.     Heart sounds: No murmur heard. Pulmonary:     Effort: Pulmonary effort is normal.     Breath sounds: No rales.  Abdominal:     General: Bowel sounds are normal.     Palpations: Abdomen is soft.     Tenderness: There is no abdominal tenderness.     Comments: Loose stools 2-3x/day  Genitourinary:    Comments: External hemorrhoids from previous examination.  Musculoskeletal:        General: No tenderness.     Cervical back: Normal range of motion and neck supple.     Right lower leg: No edema.     Left lower leg: No edema.     Comments: Able to stand up with assistance w/o pain noted  Skin:    General: Skin is warm and dry.  Neurological:     General: No focal deficit present.     Mental Status: He is alert. Mental status is at baseline.     Gait: Gait abnormal.     Comments: Oriented to person  Psychiatric:        Mood and Affect: Mood normal.        Behavior: Behavior normal.     Comments: Followed simple directions     Labs reviewed: Recent Labs    01/04/23 0000 05/16/23 0000  NA 140 143  K 3.9 3.5  CL 106 107  CO2 21 24*  BUN 48* 23*  CREATININE 1.7* 1.5*  CALCIUM 8.9 8.5*   Recent Labs    05/16/23 0000  AST 9*  ALT 14  ALKPHOS 67  ALBUMIN 3.3*   Recent Labs    01/04/23 0000 05/16/23 0000  WBC 7.0 9.6  NEUTROABS  --  5,885.00  HGB 11.7* 16.1  HCT 35* 50  PLT 168 286   Lab Results  Component Value Date   TSH 2.42 09/30/2023   Lab Results  Component Value Date   HGBA1C 6.0 08/07/2023   Lab Results  Component Value Date   CHOL 128 12/16/2016   HDL 55 12/16/2016   LDLCALC 58 12/16/2016   TRIG 97 06/29/2022   CHOLHDL 2.3 12/16/2016    Significant Diagnostic Results in last 30 days:  No results found.  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   Labs/tests ordered:  none

## 2023-11-24 NOTE — Assessment & Plan Note (Signed)
 off Valsartan

## 2023-11-24 NOTE — Assessment & Plan Note (Signed)
 Bun/creat 23/1.5 05/16/23

## 2023-11-24 NOTE — Assessment & Plan Note (Signed)
 followed by Cardiology, heart rate is in control, off  Eliquis, Hgb 16.1 05/16/23

## 2023-11-24 NOTE — Assessment & Plan Note (Signed)
 on Lexapro, Depakote, Lorazepam,  stabilizing mood resides in Memory care unit

## 2024-01-08 ENCOUNTER — Non-Acute Institutional Stay (SKILLED_NURSING_FACILITY): Payer: Self-pay | Admitting: Sports Medicine

## 2024-01-08 DIAGNOSIS — F329 Major depressive disorder, single episode, unspecified: Secondary | ICD-10-CM | POA: Diagnosis not present

## 2024-01-08 DIAGNOSIS — F02818 Dementia in other diseases classified elsewhere, unspecified severity, with other behavioral disturbance: Secondary | ICD-10-CM

## 2024-01-08 DIAGNOSIS — E039 Hypothyroidism, unspecified: Secondary | ICD-10-CM

## 2024-01-08 DIAGNOSIS — Z515 Encounter for palliative care: Secondary | ICD-10-CM | POA: Diagnosis not present

## 2024-01-08 DIAGNOSIS — G309 Alzheimer's disease, unspecified: Secondary | ICD-10-CM

## 2024-01-08 DIAGNOSIS — K5900 Constipation, unspecified: Secondary | ICD-10-CM

## 2024-01-08 NOTE — Progress Notes (Signed)
 Provider:  Dr. Tye Gall Location:  [facility] Place of Service:   skilled care   PCP: Mast, Man X, NP Patient Care Team: Mast, Man X, NP as PCP - General (Internal Medicine) Jacqueline Matsu, MD as PCP - Cardiology (Cardiology) Merl Star, MD (Internal Medicine)  Extended Emergency Contact Information Primary Emergency Contact: [facility] Address: [address]          [place], [place] [address] [place] Home Phone: [phone] Mobile Phone: [phone] Relation: Spouse Secondary Emergency Contact: Wickwire,[name] Mobile Phone: [phone] Relation: Son  Goals of Care: Advanced Directive information    10/17/2023   10:33 AM  Advanced Directives  Does Patient Have a Medical Advance Directive? Yes  Type of Estate agent of [place];Living will;Out of facility DNR (pink MOST or yellow form)  Does patient want to make changes to medical advance directive? No - Patient declined  Copy of Healthcare Power of Attorney in Chart? Yes - validated most recent copy scanned in chart (See row information)  Pre-existing out of facility DNR order (yellow form or pink MOST form) Yellow form placed in chart (order not valid for inpatient use);Pink MOST form placed in chart (order not valid for inpatient use)    Hospice related visit     History of Present Illness   89 yr old F with h/o Depression, Dementia, Afib, CHF, DM, HTN, CKD is evaluated for chronic disease management. Pt seen and examined in his room  He is laying on his bed Alert but non verbal  He is not able to comprehend and does not participate in conversations. As per nursing staff pt needs help with all of his ADLS  Needs assistance with transferring     Past Medical History:  Diagnosis Date   Allergic rhinitis, cause unspecified    BPH (benign prostatic hyperplasia)    Carotid artery stenosis, asymptomatic    1-39% by dopplers 09/2016 followed by Dr.  Nolene Baumgarten   Cataracts, bilateral    Chronic kidney disease (CKD), stage III (moderate) (HCC)    Complete heart block (HCC) 01/23/04   s/p Medtronic PPM implanted by Dr Dortha Gauss   COPD (chronic obstructive pulmonary disease) (HCC)    Diabetes mellitus    Glaucoma    Hyperlipidemia    Hypertension    Insomnia due to medical condition 08/10/2014   Kidney stones    PAF (paroxysmal atrial fibrillation) (HCC) 10/09/2014   Noted on pacer check with 88 mode switches and longest episode >1 hour.  Now on Apixiban for CHADS2VASC score of 5   Past Surgical History:  Procedure Laterality Date   PACEMAKER INSERTION  2005, 04/16/12   MDT implanted by Dr Dortha Gauss with generator chnage (MDT Adapta L) by Dr Nunzio Belch 04/16/12   PERMANENT PACEMAKER GENERATOR CHANGE N/A 04/16/2012   Procedure: PERMANENT PACEMAKER GENERATOR CHANGE;  Surgeon: Jolly Needle, MD;  Location: [facility] CATH LAB;  Service: Cardiovascular;  Laterality: N/A;   RIGHT/LEFT HEART CATH AND CORONARY ANGIOGRAPHY N/A 10/18/2020   Procedure: RIGHT/LEFT HEART CATH AND CORONARY ANGIOGRAPHY;  Surgeon: Millicent Ally, MD;  Location: [facility] INVASIVE CV LAB;  Service: Cardiovascular;  Laterality: N/A;    reports that he quit smoking about 25 years ago. His smoking use included cigarettes. He started smoking about 50 years ago. He has a 25 pack-year smoking history. He has never used smokeless tobacco. He reports that he does not currently use alcohol. He reports that he does not use drugs. Social History  Socioeconomic History   Marital status: Married    Spouse name: [name]   Number of children: 2   Years of education: BS   Highest education level: Not on file  Occupational History   Occupation: retired  Tobacco Use   Smoking status: Former    Current packs/day: 0.00    Average packs/day: 1 pack/day for 25.0 years (25.0 ttl pk-yrs)    Types: Cigarettes    Start date: 09/16/1973    Quit date: 09/16/1998    Years since quitting: 25.3   Smokeless tobacco: Never   Vaping Use   Vaping status: Never Used  Substance and Sexual Activity   Alcohol use: Not Currently    Comment: 1-2 a month glasses of wine   Drug use: No   Sexual activity: Not on file  Other Topics Concern   Not on file  Social History Narrative   Retired Mudlogger.  Lives in [place].   Patient is married with 2 children.   Patient is right handed.   Patient has BS degree.   Patient drinks 1 cup daily.         Social Drivers of Corporate investment banker Strain: Not on file  Food Insecurity: Not on file  Transportation Needs: Not on file  Physical Activity: Not on file  Stress: Not on file  Social Connections: Not on file  Intimate Partner Violence: Not on file    Functional Status Survey:    Family History  Problem Relation Age of Onset   Heart disease Father    Heart attack Father    Alzheimer's disease Sister     Health Maintenance  Topic Date Due   COVID-19 Vaccine (8 - 2024-25 season) 10/14/2023   FOOT EXAM  10/19/2023   HEMOGLOBIN A1C  02/04/2024   INFLUENZA VACCINE  04/16/2024   DTaP/Tdap/Td (2 - Td or Tdap) 10/28/2032   Pneumonia Vaccine 29+ Years old  Completed   Zoster Vaccines- Shingrix  Completed   HPV VACCINES  Aged Out   Meningococcal B Vaccine  Aged Out   OPHTHALMOLOGY EXAM  Discontinued    Allergies  Allergen Reactions   Penicillins Rash and Other (See Comments)    Childhood allergy    Irbesartan     Per [facility]   Penicillin G Benzathine Rash    Outpatient Encounter Medications as of 01/08/2024  Medication Sig   acetaminophen  (TYLENOL ) 325 MG tablet Take 2 tablets (650 mg total) by mouth every 6 (six) hours as needed for mild pain or headache.   acetaminophen  (TYLENOL ) 325 MG tablet Take 650 mg by mouth 2 (two) times daily.   cholestyramine (QUESTRAN) 4 g packet Take 4 g by mouth 2 (two) times daily.   dextromethorphan-guaiFENesin (ROBITUSSIN-DM) 10-100 MG/5ML liquid Take 10 mLs by mouth every 6 (six) hours as needed for  cough.   divalproex  (DEPAKOTE  SPRINKLE) 125 MG capsule Take 250 mg by mouth 3 (three) times daily.   escitalopram (LEXAPRO) 10 MG tablet Take 10 mg by mouth daily.   ipratropium-albuterol (DUONEB) 0.5-2.5 (3) MG/3ML SOLN Take 3 mLs by nebulization every 8 (eight) hours as needed.   levothyroxine  (SYNTHROID ) 100 MCG tablet Take 100 mcg by mouth daily before breakfast.   LORazepam  (ATIVAN ) 0.5 MG tablet Give 1 tablet daily on Monday and Wednesday   nitroGLYCERIN  (NITROSTAT ) 0.4 MG SL tablet Place 1 tablet (0.4 mg total) under the tongue every 5 (five) minutes as needed for chest pain.   OXYGEN 2lpm as needed  for SOB   saccharomyces boulardii (FLORASTOR) 250 MG capsule Take 250 mg by mouth 2 (two) times daily.   senna (SENOKOT) 8.6 MG TABS tablet Take 2 tablets by mouth as needed.   senna (SENOKOT) 8.6 MG TABS tablet Take 2 tablets by mouth every evening.   No facility-administered encounter medications on file as of 01/08/2024.    Review of Systems Negative unless indicated in HPI.  There were no vitals filed for this visit. There is no height or weight on file to calculate BMI. BP Readings from Last 3 Encounters:  11/24/23 107/72  10/23/23 (!) 153/79  10/17/23 136/66   Wt Readings from Last 3 Encounters:  11/24/23 145 lb (65.8 kg)  10/22/23 132 lb 9.6 oz (60.1 kg)  10/17/23 137 lb 9.6 oz (62.4 kg)   Physical Exam  Labs reviewed: Basic Metabolic Panel: Recent Labs    05/16/23 0000  NA 143  K 3.5  CL 107  CO2 24*  BUN 23*  CREATININE 1.5*  CALCIUM  8.5*   Liver Function Tests: Recent Labs    05/16/23 0000  AST 9*  ALT 14  ALKPHOS 67  ALBUMIN 3.3*   No results for input(s): "LIPASE", "AMYLASE" in the last 8760 hours. No results for input(s): "AMMONIA" in the last 8760 hours. CBC: Recent Labs    05/16/23 0000  WBC 9.6  NEUTROABS 5,885.00  HGB 16.1  HCT 50  PLT 286   Cardiac Enzymes: No results for input(s): "CKTOTAL", "CKMB", "CKMBINDEX", "TROPONINI" in the  last 8760 hours. BNP: Invalid input(s): "POCBNP" Lab Results  Component Value Date   HGBA1C 6.0 08/07/2023   Lab Results  Component Value Date   TSH 2.42 09/30/2023   No results found for: "VITAMINB12" No results found for: "FOLATE" Lab Results  Component Value Date   FERRITIN 103 07/02/2022    Imaging and Procedures obtained prior to SNF admission: CT Head Wo Contrast Result Date: 06/29/2022 CLINICAL DATA:  Increased lethargy and weakness, COVID EXAM: CT HEAD WITHOUT CONTRAST TECHNIQUE: Contiguous axial images were obtained from the base of the skull through the vertex without intravenous contrast. RADIATION DOSE REDUCTION: This exam was performed according to the departmental dose-optimization program which includes automated exposure control, adjustment of the mA and/or kV according to patient size and/or use of iterative reconstruction technique. COMPARISON:  05/17/2021 FINDINGS: Brain: No evidence of acute infarction, hemorrhage, mass, mass effect, or midline shift. No hydrocephalus or extra-axial fluid collection. Previously noted right cerebral convexity hematoma has resolved. Periventricular white matter changes, likely the sequela of chronic small vessel ischemic disease. Vascular: No hyperdense vessel. Skull: Normal. Negative for fracture or focal lesion. Sinuses/Orbits: Mild mucosal thickening in the ethmoid air cells. Status post bilateral lens replacements. Other: The mastoid air cells are well aerated. IMPRESSION: No acute intracranial process. A subdural hematoma noted on the 05/17/2021 head CT has resolved. Electronically Signed   By: Zoila Hines M.D.   On: 06/29/2022 17:25   DG Chest Port 1 View Result Date: 06/29/2022 CLINICAL DATA:  COVID-19 infection. Hypoxia. Increased lethargy and weakness since yesterday. Rhonchi. EXAM: PORTABLE CHEST 1 VIEW COMPARISON:  05/17/2021 FINDINGS: Normal sized heart. Aortic arch calcifications. Clear lungs with normal vascularity. Stable  right subclavian bipolar pacemaker leads. Old, healed right rib fractures. IMPRESSION: No acute abnormality. Electronically Signed   By: Catherin Closs M.D.   On: 06/29/2022 15:51    Assessment and Plan Assessment & Plan  Hospice care patient  Pt appears pleasant and comfortable  Cont with  supportive care  Major Neurocognitive disorder with behavioral problems Intermittent agitation  Cont with depakote , ativan  prn   Hypothyroidism Cont with synthyroid  Depression  Cont with lexapro  Constipation  Cont with senna    30 min Total time spent for obtaining history,  performing a medically appropriate examination and evaluation, reviewing the tests, ,  documenting clinical information in the electronic or other health record,  ,care coordination (not separately reported)

## 2024-01-12 ENCOUNTER — Encounter: Payer: Self-pay | Admitting: Sports Medicine

## 2024-01-27 ENCOUNTER — Non-Acute Institutional Stay (SKILLED_NURSING_FACILITY): Payer: Self-pay | Admitting: Nurse Practitioner

## 2024-01-27 ENCOUNTER — Encounter: Payer: Self-pay | Admitting: Nurse Practitioner

## 2024-01-27 DIAGNOSIS — I480 Paroxysmal atrial fibrillation: Secondary | ICD-10-CM

## 2024-01-27 DIAGNOSIS — F0394 Unspecified dementia, unspecified severity, with anxiety: Secondary | ICD-10-CM

## 2024-01-27 DIAGNOSIS — R627 Adult failure to thrive: Secondary | ICD-10-CM

## 2024-01-27 DIAGNOSIS — F02818 Dementia in other diseases classified elsewhere, unspecified severity, with other behavioral disturbance: Secondary | ICD-10-CM

## 2024-01-27 DIAGNOSIS — E039 Hypothyroidism, unspecified: Secondary | ICD-10-CM

## 2024-01-27 DIAGNOSIS — G301 Alzheimer's disease with late onset: Secondary | ICD-10-CM | POA: Diagnosis not present

## 2024-01-27 DIAGNOSIS — I10 Essential (primary) hypertension: Secondary | ICD-10-CM

## 2024-01-27 DIAGNOSIS — N1831 Chronic kidney disease, stage 3a: Secondary | ICD-10-CM

## 2024-01-27 NOTE — Assessment & Plan Note (Signed)
 Bun/creat 23/1.5 05/16/23

## 2024-01-27 NOTE — Assessment & Plan Note (Addendum)
 on Lexapro, Depakote, Lorazepam,  stabilizing mood resides in Memory care unit

## 2024-01-27 NOTE — Assessment & Plan Note (Signed)
 taking Levothyroxine, TSH 2.41 09/30/23

## 2024-01-27 NOTE — Progress Notes (Unsigned)
 Location:   [facility] Room Number: 110 Place of Service:  SNF (31) Provider: Abner Hoffman Chanon Loney NP  Garl Speigner X, NP  Patient Care Team: Ellisa Devivo X, NP as PCP - General (Internal Medicine) Jacqueline Matsu, MD as PCP - Cardiology (Cardiology) Merl Star, MD (Internal Medicine)  Extended Emergency Contact Information Primary Emergency Contact: [facility] Address: [address]          [place], [place] [address] [place] Home Phone: [phone] Mobile Phone: [phone] Relation: Spouse Secondary Emergency Contact: Pyle,[name] Mobile Phone: [phone] Relation: Son  Code Status:  DNR Goals of care: Advanced Directive information    10/17/2023   10:33 AM  Advanced Directives  Does Patient Have a Medical Advance Directive? Yes  Type of Estate agent of [place];Living will;Out of facility DNR (pink MOST or yellow form)  Does patient want to make changes to medical advance directive? No - Patient declined  Copy of Healthcare Power of Attorney in Chart? Yes - validated most recent copy scanned in chart (See row information)  Pre-existing out of facility DNR order (yellow form or pink MOST form) Yellow form placed in chart (order not valid for inpatient use);Pink MOST form placed in chart (order not valid for inpatient use)     Chief Complaint  Patient presents with  . Medical Management of Chronic Issues    HPI:  Pt is a 89 y.o. male seen today for medical management of chronic diseases.      Constipation, takes Senna Depression/anxiety, on Lexapro, Depakote , Lorazepam ,  stabilizing mood Dementia, resides in Memory care unit  Frequent falls, contributory factors: increased frailty, gait issues, and poor safety awareness             Adult Failure To Thrive, weight stable, followed by dietary, under Hospice service.              Afib heart rate is in control, off  Eliquis , Hgb 16.1 05/16/23             CAD, off Statin, on  prn NTG             CHFrEF, off Farxiga , EF 30-35%, compensated clinically.              CHB s/p PPM             T2DM, on diet, off FArxiga , Hgb a1c 6.0 08/07/23             Hypothyroidism, taking Levothyroxine , TSH 2.41 09/30/23             Hyperlipidemia, off  Atorvastatin , on Questran,  LDL 60 06/13/22             HTN, off Valsartan              CKD chronic, Bun/creat 23/1.5 05/16/23  Past Medical History:  Diagnosis Date  . Allergic rhinitis, cause unspecified   . BPH (benign prostatic hyperplasia)   . Carotid artery stenosis, asymptomatic    1-39% by dopplers 09/2016 followed by Dr. Nolene Baumgarten  . Cataracts, bilateral   . Chronic kidney disease (CKD), stage III (moderate) (HCC)   . Complete heart block (HCC) 01/23/04   s/p Medtronic PPM implanted by Dr Dortha Gauss  . COPD (chronic obstructive pulmonary disease) (HCC)   . Diabetes mellitus   . Glaucoma   . Hyperlipidemia   . Hypertension   . 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 Kidney stones   . PAF (paroxysmal  atrial fibrillation) (HCC) 10/09/2014   Noted on pacer check with 88 mode switches and longest episode >1 hour.  Now on Apixiban for CHADS2VASC score of 5   Past Surgical History:  Procedure Laterality Date  . PACEMAKER INSERTION  2005, 04/16/12   MDT implanted by Dr Dortha Gauss with generator chnage (MDT Adapta L) by Dr Nunzio Belch 04/16/12  . PERMANENT PACEMAKER GENERATOR CHANGE N/A 04/16/2012   Procedure: PERMANENT PACEMAKER GENERATOR CHANGE;  Surgeon: Jolly Needle, MD;  Location: [facility] CATH LAB;  Service: Cardiovascular;  Laterality: N/A;  . RIGHT/LEFT HEART CATH AND CORONARY ANGIOGRAPHY N/A 10/18/2020   Procedure: RIGHT/LEFT HEART CATH AND CORONARY ANGIOGRAPHY;  Surgeon: Millicent Ally, MD;  Location: [facility] INVASIVE CV LAB;  Service: Cardiovascular;  Laterality: N/A;    Allergies  Allergen Reactions  . Penicillins Rash and Other (See Comments)    Childhood allergy   . Irbesartan     Per [facility]  . Penicillin G Benzathine Rash     Allergies as of 01/27/2024       Reactions   Penicillins Rash, Other (See Comments)   Childhood allergy    Irbesartan    Per [facility]   Penicillin G Benzathine Rash        Medication List        Accurate as of Jan 27, 2024 11:59 PM. If you have any questions, ask your nurse or doctor.          acetaminophen  325 MG tablet Commonly known as: TYLENOL  Take 650 mg by mouth 2 (two) times daily.   acetaminophen  325 MG tablet Commonly known as: TYLENOL  Take 2 tablets (650 mg total) by mouth every 6 (six) hours as needed for mild pain or headache.   cholestyramine 4 g packet Commonly known as: QUESTRAN Take 4 g by mouth 2 (two) times daily.   dextromethorphan-guaiFENesin 10-100 MG/5ML liquid Commonly known as: ROBITUSSIN-DM Take 10 mLs by mouth every 6 (six) hours as needed for cough.   divalproex  125 MG capsule Commonly known as: DEPAKOTE  SPRINKLE Take 250 mg by mouth 3 (three) times daily.   escitalopram 10 MG tablet Commonly known as: LEXAPRO Take 10 mg by mouth daily.   fluticasone 50 MCG/ACT nasal spray Commonly known as: FLONASE Place 1 spray into both nostrils daily.   ipratropium-albuterol 0.5-2.5 (3) MG/3ML Soln Commonly known as: DUONEB Take 3 mLs by nebulization every 8 (eight) hours as needed.   levothyroxine  100 MCG tablet Commonly known as: SYNTHROID  Take 100 mcg by mouth daily before breakfast.   LORazepam  0.5 MG tablet Commonly known as: ATIVAN  Give 1 tablet daily on Monday and Wednesday   nitroGLYCERIN  0.4 MG SL tablet Commonly known as: NITROSTAT  Place 1 tablet (0.4 mg total) under the tongue every 5 (five) minutes as needed for chest pain.   OXYGEN 2lpm as needed for SOB   saccharomyces boulardii 250 MG capsule Commonly known as: FLORASTOR Take 250 mg by mouth 2 (two) times daily.   senna 8.6 MG Tabs tablet Commonly known as: SENOKOT Take 2 tablets by mouth as needed.        Review of Systems  Unable to perform ROS: Dementia     Immunization History  Administered Date(s) Administered  . Fluad Quad(high Dose 65+) 08/05/2023  . Influenza-Unspecified 07/04/2014, 08/05/2022, 08/05/2023  . Moderna Covid-19 Vaccine Bivalent Booster 89yrs & up 07/18/2022  . Moderna SARS-COV2 Booster Vaccination 07/25/2020, 02/13/2021  . Moderna Sars-Covid-2 Vaccination 09/20/2019, 10/18/2019  . PNEUMOCOCCAL CONJUGATE-20 08/07/2022  . Pfizer Covid-19 Vaccine Bivalent  Booster 18yrs & up 06/20/2021, 08/19/2023  . Respiratory Syncytial Virus Vaccine,Recomb Aduvanted(Arexvy) 10/04/2022  . Tdap 10/28/2022  . Zoster Recombinant(Shingrix) 11/06/2017, 03/28/2018   Pertinent  Health Maintenance Due  Topic Date Due  . FOOT EXAM  10/19/2023  . HEMOGLOBIN A1C  02/04/2024  . INFLUENZA VACCINE  04/16/2024  . OPHTHALMOLOGY EXAM  Discontinued      07/05/2022    4:50 PM 10/18/2022   11:21 AM 11/04/2022    2:33 PM 11/15/2022    8:54 AM 12/26/2022   11:29 AM  Fall Risk  Falls in the past year? 0 0 0 0 0  Was there an injury with Fall? 0 0 0 0 0  Fall Risk Category Calculator 0 0 0 0 0  Fall Risk Category (Retired) Low      (RETIRED) Patient Fall Risk Level High fall risk      Patient at Risk for Falls Due to History of fall(s) No Fall Risks  No Fall Risks No Fall Risks  Fall risk Follow up Falls evaluation completed Falls evaluation completed Falls evaluation completed Falls evaluation completed Falls evaluation completed   Functional Status Survey:    Vitals:   01/27/24 1226  BP: 138/75  Pulse: 65  Resp: 18  Temp: (!) 97.4 F (36.3 C)  SpO2: 95%  Weight: 139 lb 3.2 oz (63.1 kg)   Body mass index is 22.47 kg/m. Physical Exam Vitals and nursing note reviewed.  Constitutional:      Appearance: Normal appearance.  HENT:     Head: Normocephalic and atraumatic.     Nose: Nose normal.     Mouth/Throat:     Mouth: Mucous membranes are moist.  Eyes:     Extraocular Movements: Extraocular movements intact.     Conjunctiva/sclera:  Conjunctivae normal.     Pupils: Pupils are equal, round, and reactive to light.  Cardiovascular:     Rate and Rhythm: Normal rate and regular rhythm.     Heart sounds: No murmur heard. Pulmonary:     Effort: Pulmonary effort is normal.     Breath sounds: No rales.  Abdominal:     General: Bowel sounds are normal.     Palpations: Abdomen is soft.     Tenderness: There is no abdominal tenderness.     Comments: Loose stools 2-3x/day  Genitourinary:    Comments: External hemorrhoids from previous examination.  Musculoskeletal:        General: No tenderness.     Cervical back: Normal range of motion and neck supple.     Right lower leg: No edema.     Left lower leg: No edema.     Comments: Able to stand up with assistance w/o pain noted  Skin:    General: Skin is warm and dry.  Neurological:     General: No focal deficit present.     Mental Status: He is alert. Mental status is at baseline.     Gait: Gait abnormal.     Comments: Oriented to person  Psychiatric:        Mood and Affect: Mood normal.        Behavior: Behavior normal.     Comments: Followed simple directions    Labs reviewed: Recent Labs    05/16/23 0000  NA 143  K 3.5  CL 107  CO2 24*  BUN 23*  CREATININE 1.5*  CALCIUM  8.5*   Recent Labs    05/16/23 0000  AST 9*  ALT 14  ALKPHOS 67  ALBUMIN 3.3*   Recent Labs    05/16/23 0000  WBC 9.6  NEUTROABS 5,885.00  HGB 16.1  HCT 50  PLT 286   Lab Results  Component Value Date   TSH 2.42 09/30/2023   Lab Results  Component Value Date   HGBA1C 6.0 08/07/2023   Lab Results  Component Value Date   CHOL 128 12/16/2016   HDL 55 12/16/2016   LDLCALC 58 12/16/2016   TRIG 97 06/29/2022   CHOLHDL 2.3 12/16/2016    Significant Diagnostic Results in last 30 days:  No results found.  Assessment/Plan  Adult failure to thrive weight stable, followed by dietary, under Hospice service.   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  resides in Memory care unit   PAF (paroxysmal atrial fibrillation) (HCC) heart rate is in control, off  Eliquis , Hgb 16.1 05/16/23  Hypothyroidism  taking Levothyroxine , TSH 2.41 09/30/23  Chronic renal insufficiency, stage III (moderate) (HCC) Bun/creat 23/1.5 05/16/23  Hypertension Blood pressure is controlled, off meds.    Family/ staff Communication: plan of care reviewed with the patient and charge nurse.   Labs/tests ordered:  none

## 2024-01-27 NOTE — Assessment & Plan Note (Signed)
 weight stable, followed by dietary, under Hospice service.

## 2024-01-27 NOTE — Assessment & Plan Note (Signed)
 Blood pressure is controlled, off meds.

## 2024-01-27 NOTE — Assessment & Plan Note (Signed)
 heart rate is in control, off  Eliquis , Hgb 16.1 05/16/23

## 2024-01-27 NOTE — Assessment & Plan Note (Deleted)
 resides in Memory care unit

## 2024-02-05 ENCOUNTER — Telehealth: Payer: Self-pay

## 2024-02-05 NOTE — Telephone Encounter (Signed)
 Attempted to call patient/wife to schedule an appointment with EP MD as patient is overdue for 1 yr f/u on PPM MDT from a previous [facility] patient - was due 04/2022 as well as patient had an estimated 9 months to ERI @ 08/2022 remote - no answer, lvmtcb.

## 2024-02-05 NOTE — Telephone Encounter (Signed)
 Wife sent a message in her MyChart inquiring about patients device. "I am trying to determine the date of the last battery change to Edward Suarez's pacemaker.  I also need to know approximately how long batteries, once installed, typically last. Any information would be appreciated.  Many thanks"  After reviewing the information this is the key points of what I found - Patient is dependent - Former [facility] patient lost to f/u - had an estimated 9 months to ERI on 08/20/22 remote   I called and spoke to wife. Not clear she understands the importance of this. Sending to EP scheduler to set up appointment to reestablish w/ EP.

## 2024-02-06 NOTE — Telephone Encounter (Signed)
 2nd attempt to call patient/wife to schedule an appointment with EP MD as patient is overdue for 1 yr f/u on PPM MDT from a previous [facility] patient - was due 04/2022 as well as patient had an estimated 9 months to ERI @ 08/2022 remote - no answer, lvmtcb

## 2024-02-10 NOTE — Telephone Encounter (Signed)
 I just called the son Debria Fang, gave our direct number to give to pts wife to call us  back. If no return call please send back to pool for us  to advise further.

## 2024-02-10 NOTE — Telephone Encounter (Signed)
 It does look like [facility] last contacted patient for missed remotes 08/21/23.  Routing to Dr. Lawana Pray to make aware.

## 2024-02-10 NOTE — Telephone Encounter (Signed)
 Patients wife Edward Suarez, returned call. Below are points she made regarding the appointment scheduling attempts:  -Hospice patient at [facility], been there for 19 months -Highly unlikely she could get him to the office -They will not be taking any unusual methods to prolong his life. No point in taking it any further, they are only interested in making him comfortable  -He is in late stage dementia, non verbal  -Someone at some point, organized for their at home device to be returned as she received the return packaging. She states 'in my eyes, that was your office washing your hands of this'.  Advised patients wife that I would made the device clinic aware. As well as no more attempts to call and schedule would be made on my part.

## 2024-02-10 NOTE — Telephone Encounter (Signed)
 3rd attempt to call patient/wife to schedule an appointment with EP MD as patient is overdue for 1 yr f/u on PPM MDT from a previous [facility] patient - was due 04/2022 as well as patient had an estimated 9 months to ERI @ 08/2022 remote - no answer, lvmtcb.  Next step following 3rd call to patient?

## 2024-02-11 NOTE — Telephone Encounter (Signed)
 Left message for Pt's wife requesting call back.

## 2024-02-12 NOTE — Telephone Encounter (Signed)
 Spoke to patients wife Edward Suarez.   Advised as of 08/20/2022 the battery estimate was <1-21 months (9 months) left until ERI. Advised unless we check the device we do not know if the device is working, how much time is left on the battery and if he is dependent. Edward Suarez states she understood  what I explained and patient is in the last stage of dementia and does not want to have the battery changed. I advised [name] if [name] is dependent on the pacemaker and the battery reached EOL (end of life) his heart may stop. Edward Suarez states she is aware and understands that and does not want any further treatment.   States she would like to make sure no further phone calls about the device being changed were taken place unless we have something else to discuss with her. Advised if she has any further questions to please contact us . Voiced understanding.  Routing to Dr. Lawana Pray to update.

## 2024-02-18 ENCOUNTER — Other Ambulatory Visit: Payer: Self-pay | Admitting: Adult Health

## 2024-02-18 DIAGNOSIS — F0394 Unspecified dementia, unspecified severity, with anxiety: Secondary | ICD-10-CM

## 2024-02-18 MED ORDER — LORAZEPAM 0.5 MG PO TABS: ORAL_TABLET | ORAL | 0 refills | Status: DC

## 2024-02-19 ENCOUNTER — Non-Acute Institutional Stay (SKILLED_NURSING_FACILITY): Payer: Self-pay | Admitting: Nurse Practitioner

## 2024-02-19 ENCOUNTER — Encounter: Payer: Self-pay | Admitting: Nurse Practitioner

## 2024-02-19 DIAGNOSIS — I480 Paroxysmal atrial fibrillation: Secondary | ICD-10-CM

## 2024-02-19 DIAGNOSIS — N2889 Other specified disorders of kidney and ureter: Secondary | ICD-10-CM

## 2024-02-19 DIAGNOSIS — N1831 Chronic kidney disease, stage 3a: Secondary | ICD-10-CM

## 2024-02-19 DIAGNOSIS — F0390 Unspecified dementia without behavioral disturbance: Secondary | ICD-10-CM

## 2024-02-19 DIAGNOSIS — W19XXXA Unspecified fall, initial encounter: Secondary | ICD-10-CM

## 2024-02-19 DIAGNOSIS — I10 Essential (primary) hypertension: Secondary | ICD-10-CM

## 2024-02-19 DIAGNOSIS — E7800 Pure hypercholesterolemia, unspecified: Secondary | ICD-10-CM

## 2024-02-19 DIAGNOSIS — I442 Atrioventricular block, complete: Secondary | ICD-10-CM

## 2024-02-19 DIAGNOSIS — E039 Hypothyroidism, unspecified: Secondary | ICD-10-CM

## 2024-02-19 DIAGNOSIS — E0849 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: Secondary | ICD-10-CM

## 2024-02-19 DIAGNOSIS — Z66 Do not resuscitate: Secondary | ICD-10-CM

## 2024-02-19 DIAGNOSIS — K5901 Slow transit constipation: Secondary | ICD-10-CM

## 2024-02-19 DIAGNOSIS — R627 Adult failure to thrive: Secondary | ICD-10-CM | POA: Diagnosis not present

## 2024-02-19 DIAGNOSIS — I5020 Unspecified systolic (congestive) heart failure: Secondary | ICD-10-CM

## 2024-02-19 DIAGNOSIS — F0394 Unspecified dementia, unspecified severity, with anxiety: Secondary | ICD-10-CM

## 2024-02-19 NOTE — Assessment & Plan Note (Signed)
 on diet, off FArxiga, Hgb a1c 6.0 08/07/23

## 2024-02-19 NOTE — Assessment & Plan Note (Signed)
 off Farxiga , EF 30-35%, compensated clinically.              CHB s/p PPM

## 2024-02-19 NOTE — Assessment & Plan Note (Addendum)
 Loose control Bp, intermittent elevated Sbp in 150s, asymptomatic,  off Valsartan 

## 2024-02-19 NOTE — Assessment & Plan Note (Signed)
 weight stable, followed by dietary, under Hospice service.

## 2024-02-19 NOTE — Assessment & Plan Note (Signed)
 off  Atorvastatin, on Questran,  LDL 60 06/13/22

## 2024-02-19 NOTE — Assessment & Plan Note (Signed)
Stable, continue Senna.  ?

## 2024-02-19 NOTE — Assessment & Plan Note (Signed)
 resides in Memory care unit

## 2024-02-19 NOTE — Assessment & Plan Note (Signed)
 taking Levothyroxine, TSH 2.41 09/30/23

## 2024-02-19 NOTE — Assessment & Plan Note (Signed)
 contributory factors: increased frailty, gait issues, and poor safety awareness

## 2024-02-19 NOTE — Assessment & Plan Note (Signed)
 No noted chest pain since last seen, off Statin, on prn NTG

## 2024-02-19 NOTE — Assessment & Plan Note (Signed)
 Bun/creat 23/1.5 05/16/23

## 2024-02-19 NOTE — Assessment & Plan Note (Signed)
 His mood is stable, on Lexapro, Depakote , Lorazepam ,  stabilizing mood

## 2024-02-19 NOTE — Assessment & Plan Note (Signed)
 heart rate is in control, off  Eliquis , Hgb 16.1 05/16/23

## 2024-02-19 NOTE — Progress Notes (Signed)
 Location:  [facility] Room Number: [id number] Place of Service:  SNF (31) Provider:  Yelina Sarratt X, NP  Patient Care Team: Treveon Bourcier X, NP as PCP - General (Internal Medicine) Jacqueline Matsu, MD as PCP - Cardiology (Cardiology) Merl Star, MD (Internal Medicine)  Extended Emergency Contact Information Primary Emergency Contact: [facility] Address: [address]          [place], [place] [address] [place] Home Phone: [phone] Mobile Phone: [phone] Relation: Spouse Secondary Emergency Contact: Friis,[name] Mobile Phone: [phone] Relation: Son  Code Status:  DNR Goals of care: Advanced Directive information    10/17/2023   10:33 AM  Advanced Directives  Does Patient Have a Medical Advance Directive? Yes  Type of Estate agent of [place];Living will;Out of facility DNR (pink MOST or yellow form)  Does patient want to make changes to medical advance directive? No - Patient declined  Copy of Healthcare Power of Attorney in Chart? Yes - validated most recent copy scanned in chart (See row information)  Pre-existing out of facility DNR order (yellow form or pink MOST form) Yellow form placed in chart (order not valid for inpatient use);Pink MOST form placed in chart (order not valid for inpatient use)     Chief Complaint  Patient presents with   Medical Management of Chronic Issues    Routine visit. Discuss need for foot exam and A1c    HPI:  Pt is a 89 y.o. male seen today for medical management of chronic diseases.    Constipation, takes Senna Depression/anxiety, on Lexapro, Depakote , Lorazepam ,  stabilizing mood Dementia, resides in Memory care unit  Frequent falls, contributory factors: increased frailty, gait issues, and poor safety awareness             Adult Failure To Thrive, weight stable, followed by dietary, under Hospice service.              Afib heart rate is in control, off  Eliquis ,  Hgb 16.1 05/16/23             CAD, off Statin, on prn NTG             CHFrEF, off Farxiga , EF 30-35%, compensated clinically.              CHB s/p PPM             T2DM, on diet, off FArxiga , Hgb a1c 6.0 08/07/23             Hypothyroidism, taking Levothyroxine , TSH 2.41 09/30/23             Hyperlipidemia, off  Atorvastatin , on Questran,  LDL 60 06/13/22             HTN, off Valsartan              CKD chronic, Bun/creat 23/1.5 05/16/23  Past Medical History:  Diagnosis Date   Acute encephalopathy 06/29/2022   Allergic rhinitis, cause unspecified    BPH (benign prostatic hyperplasia)    Carotid artery stenosis, asymptomatic    1-39% by dopplers 09/2016 followed by Dr. Nolene Baumgarten   Cataracts, bilateral    Chronic kidney disease (CKD), stage III (moderate) (HCC)    Complete heart block (HCC) 01/23/2004   s/p Medtronic PPM implanted by Dr Dortha Gauss   COPD (chronic obstructive pulmonary disease) (HCC)    Diabetes mellitus    Glaucoma    Hyperlipidemia    Hypertension    Insomnia due  to medical condition 08/10/2014   Kidney stones    PAF (paroxysmal atrial fibrillation) (HCC) 10/09/2014   Noted on pacer check with 88 mode switches and longest episode >1 hour.  Now on Apixiban for CHADS2VASC score of 5   Past Surgical History:  Procedure Laterality Date   PACEMAKER INSERTION  2005, 04/16/12   MDT implanted by Dr Dortha Gauss with generator chnage (MDT Adapta L) by Dr Nunzio Belch 04/16/12   PERMANENT PACEMAKER GENERATOR CHANGE N/A 04/16/2012   Procedure: PERMANENT PACEMAKER GENERATOR CHANGE;  Surgeon: Jolly Needle, MD;  Location: [facility] CATH LAB;  Service: Cardiovascular;  Laterality: N/A;   RIGHT/LEFT HEART CATH AND CORONARY ANGIOGRAPHY N/A 10/18/2020   Procedure: RIGHT/LEFT HEART CATH AND CORONARY ANGIOGRAPHY;  Surgeon: Millicent Ally, MD;  Location: [facility] INVASIVE CV LAB;  Service: Cardiovascular;  Laterality: N/A;    Allergies  Allergen Reactions   Penicillins Rash and Other (See Comments)    Childhood allergy     Irbesartan     Per [facility]   Penicillin G Benzathine Rash    Outpatient Encounter Medications as of 02/19/2024  Medication Sig   acetaminophen  (TYLENOL ) 325 MG tablet Take 2 tablets (650 mg total) by mouth every 6 (six) hours as needed for mild pain or headache.   acetaminophen  (TYLENOL ) 325 MG tablet Take 650 mg by mouth 2 (two) times daily. Scheduled, see as need listing also   cholestyramine (QUESTRAN) 4 g packet Take 4 g by mouth 2 (two) times daily.   dextromethorphan-guaiFENesin (ROBITUSSIN-DM) 10-100 MG/5ML liquid Take 10 mLs by mouth every 6 (six) hours as needed for cough.   divalproex  (DEPAKOTE  SPRINKLE) 125 MG capsule Take 250 mg by mouth 3 (three) times daily.   escitalopram (LEXAPRO) 10 MG tablet Take 10 mg by mouth daily.   fexofenadine (ALLEGRA) 60 MG tablet Take 60 mg by mouth daily.   fluticasone (FLONASE) 50 MCG/ACT nasal spray Place 1 spray into both nostrils daily.   ipratropium-albuterol (DUONEB) 0.5-2.5 (3) MG/3ML SOLN Take 3 mLs by nebulization every 8 (eight) hours as needed.   levothyroxine  (SYNTHROID ) 100 MCG tablet Take 100 mcg by mouth daily before breakfast.   LORazepam  (ATIVAN ) 0.5 MG tablet Give 1 tablet daily on Monday and Wednesday   nitroGLYCERIN  (NITROSTAT ) 0.4 MG SL tablet Place 1 tablet (0.4 mg total) under the tongue every 5 (five) minutes as needed for chest pain.   saccharomyces boulardii (FLORASTOR) 250 MG capsule Take 250 mg by mouth 2 (two) times daily.   senna (SENOKOT) 8.6 MG TABS tablet Take 2 tablets by mouth as needed for mild constipation. And two tablets in the evening scheduled   OXYGEN 2lpm as needed for SOB   No facility-administered encounter medications on file as of 02/19/2024.    Review of Systems  Unable to perform ROS: Dementia    Immunization History  Administered Date(s) Administered   Fluad Quad(high Dose 65+) 08/05/2023   Influenza-Unspecified 07/04/2014, 08/05/2022, 08/05/2023   Moderna Covid-19 Vaccine Bivalent Booster  6yrs & up 07/18/2022   Moderna SARS-COV2 Booster Vaccination 07/25/2020, 02/13/2021   Moderna Sars-Covid-2 Vaccination 09/20/2019, 10/18/2019   PNEUMOCOCCAL CONJUGATE-20 08/07/2022   Pfizer Covid-19 Vaccine Bivalent Booster 44yrs & up 06/20/2021, 08/19/2023   Respiratory Syncytial Virus Vaccine,Recomb Aduvanted(Arexvy) 10/04/2022   Tdap 10/28/2022   Zoster Recombinant(Shingrix) 11/06/2017, 03/28/2018   Pertinent  Health Maintenance Due  Topic Date Due   FOOT EXAM  10/19/2023   HEMOGLOBIN A1C  02/04/2024   INFLUENZA VACCINE  04/16/2024   OPHTHALMOLOGY EXAM  Discontinued  07/05/2022    4:50 PM 10/18/2022   11:21 AM 11/04/2022    2:33 PM 11/15/2022    8:54 AM 12/26/2022   11:29 AM  Fall Risk  Falls in the past year? 0 0 0 0 0  Was there an injury with Fall? 0 0 0 0 0  Fall Risk Category Calculator 0 0 0 0 0  Fall Risk Category (Retired) Low      (RETIRED) Patient Fall Risk Level High fall risk      Patient at Risk for Falls Due to History of fall(s) No Fall Risks  No Fall Risks No Fall Risks  Fall risk Follow up Falls evaluation completed Falls evaluation completed Falls evaluation completed Falls evaluation completed Falls evaluation completed   Functional Status Survey:    Vitals:   02/19/24 1130 02/19/24 1133  BP: (!) 150/63 (!) 150/63  Pulse: 65   SpO2: 93%   Weight: 141 lb 12.8 oz (64.3 kg)   Height: 5\' 6"  (1.676 m)    Body mass index is 22.89 kg/m. Physical Exam Vitals and nursing note reviewed.  Constitutional:      Appearance: Normal appearance.  HENT:     Head: Normocephalic and atraumatic.     Nose: Nose normal.     Mouth/Throat:     Mouth: Mucous membranes are moist.  Eyes:     Extraocular Movements: Extraocular movements intact.     Conjunctiva/sclera: Conjunctivae normal.     Pupils: Pupils are equal, round, and reactive to light.  Cardiovascular:     Rate and Rhythm: Normal rate and regular rhythm.     Heart sounds: No murmur heard. Pulmonary:      Effort: Pulmonary effort is normal.     Breath sounds: No rales.  Abdominal:     General: Bowel sounds are normal.     Palpations: Abdomen is soft.     Tenderness: There is no abdominal tenderness.     Comments: Loose stools 2-3x/day  Genitourinary:    Comments: External hemorrhoids from previous examination.  Musculoskeletal:        General: No tenderness.     Cervical back: Normal range of motion and neck supple.     Right lower leg: No edema.     Left lower leg: No edema.     Comments: Able to stand up with assistance w/o pain noted  Skin:    General: Skin is warm and dry.  Neurological:     General: No focal deficit present.     Mental Status: He is alert. Mental status is at baseline.     Gait: Gait abnormal.     Comments: Oriented to person  Psychiatric:        Mood and Affect: Mood normal.        Behavior: Behavior normal.     Comments: Followed simple directions     Labs reviewed: Recent Labs    05/16/23 0000  NA 143  K 3.5  CL 107  CO2 24*  BUN 23*  CREATININE 1.5*  CALCIUM  8.5*   Recent Labs    05/16/23 0000  AST 9*  ALT 14  ALKPHOS 67  ALBUMIN 3.3*   Recent Labs    05/16/23 0000  WBC 9.6  NEUTROABS 5,885.00  HGB 16.1  HCT 50  PLT 286   Lab Results  Component Value Date   TSH 2.42 09/30/2023   Lab Results  Component Value Date   HGBA1C 6.0 08/07/2023   Lab Results  Component Value Date   CHOL 128 12/16/2016   HDL 55 12/16/2016   LDLCALC 58 12/16/2016   TRIG 97 06/29/2022   CHOLHDL 2.3 12/16/2016    Significant Diagnostic Results in last 30 days:  No results found.  Assessment/Plan Major neurocognitive disorder (HCC)  resides in Memory care unit   Fall contributory factors: increased frailty, gait issues, and poor safety awareness  Adult failure to thrive  weight stable, followed by dietary, under Hospice service.   PAF (paroxysmal atrial fibrillation) (HCC) heart rate is in control, off  Eliquis , Hgb 16.1  05/16/23  Atrioventricular block, complete (HCC) No noted chest pain since last seen, off Statin, on prn NTG  HFrEF (heart failure with reduced ejection fraction) (HCC) off Farxiga , EF 30-35%, compensated clinically.              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)  on diet, off FArxiga , Hgb a1c 6.0 08/07/23  Hypothyroidism taking Levothyroxine , TSH 2.41 09/30/23  Hypercholesterolemia without hypertriglyceridemia off  Atorvastatin , on Questran,  LDL 60 06/13/22  Hypertension Loose control Bp, intermittent elevated Sbp in 150s, asymptomatic,  off Valsartan   Chronic renal insufficiency, stage III (moderate) (HCC) Bun/creat 23/1.5 05/16/23  Anxiety due to dementia [facility]) His mood is stable, on Lexapro, Depakote , Lorazepam ,  stabilizing mood  Slow transit constipation Stable, continue Senna     Family/ staff Communication: plan of care reviewed with the patient and charge nurse.   Labs/tests ordered:  none

## 2024-02-23 ENCOUNTER — Encounter: Payer: Self-pay | Admitting: Nurse Practitioner

## 2024-03-23 DIAGNOSIS — M79672 Pain in left foot: Secondary | ICD-10-CM | POA: Diagnosis not present

## 2024-03-23 DIAGNOSIS — L602 Onychogryphosis: Secondary | ICD-10-CM | POA: Diagnosis not present

## 2024-03-23 DIAGNOSIS — L84 Corns and callosities: Secondary | ICD-10-CM | POA: Diagnosis not present

## 2024-03-23 DIAGNOSIS — E119 Type 2 diabetes mellitus without complications: Secondary | ICD-10-CM | POA: Diagnosis not present

## 2024-03-23 DIAGNOSIS — M79671 Pain in right foot: Secondary | ICD-10-CM | POA: Diagnosis not present

## 2024-03-24 ENCOUNTER — Other Ambulatory Visit: Payer: Self-pay | Admitting: Orthopedic Surgery

## 2024-03-24 DIAGNOSIS — F0394 Unspecified dementia, unspecified severity, with anxiety: Secondary | ICD-10-CM

## 2024-03-24 MED ORDER — LORAZEPAM 0.5 MG PO TABS
ORAL_TABLET | ORAL | 0 refills | Status: DC
Start: 2024-03-24 — End: 2024-06-09

## 2024-03-26 ENCOUNTER — Non-Acute Institutional Stay (SKILLED_NURSING_FACILITY): Payer: Self-pay

## 2024-03-26 DIAGNOSIS — F02818 Dementia in other diseases classified elsewhere, unspecified severity, with other behavioral disturbance: Secondary | ICD-10-CM

## 2024-03-26 DIAGNOSIS — G309 Alzheimer's disease, unspecified: Secondary | ICD-10-CM

## 2024-03-26 DIAGNOSIS — Z515 Encounter for palliative care: Secondary | ICD-10-CM

## 2024-03-26 DIAGNOSIS — F3289 Other specified depressive episodes: Secondary | ICD-10-CM

## 2024-03-26 DIAGNOSIS — J302 Other seasonal allergic rhinitis: Secondary | ICD-10-CM | POA: Diagnosis not present

## 2024-03-26 DIAGNOSIS — E039 Hypothyroidism, unspecified: Secondary | ICD-10-CM

## 2024-03-26 NOTE — Progress Notes (Signed)
 Location:  [facility] Room Number: [id number] Place of Service:  SNF (31) Provider: Sherlynn Jackalyn COME  Mast, Man X, NP  Patient Care Team: Mast, Man X, NP as PCP - General (Internal Medicine) Shlomo Wilbert SAUNDERS, MD as PCP - Cardiology (Cardiology) Rexanne Ingle, MD (Internal Medicine)  Extended Emergency Contact Information Primary Emergency Contact: [facility] Address: [address]          [place], [place] [address] [place] Home Phone: [phone] Mobile Phone: [phone] Relation: Spouse Secondary Emergency Contact: Gladman,[name] Mobile Phone: [phone] Relation: Son  Code Status:  DNR Goals of care: Advanced Directive information    03/26/2024    2:05 PM  Advanced Directives  Does Patient Have a Medical Advance Directive? Yes  Type of Estate agent of [place];Out of facility DNR (pink MOST or yellow form);Living will  Does patient want to make changes to medical advance directive? No - Patient declined  Copy of Healthcare Power of Attorney in Chart? Yes - validated most recent copy scanned in chart (See row information)  Pre-existing out of facility DNR order (yellow form or pink MOST form) Pink MOST form placed in chart (order not valid for inpatient use)     Chief Complaint  Patient presents with   Medical Management of Chronic Issues    Routine visit                                      Hospice related visit    HPI:  Pt is a 89 y.o. male with past medical history of dementia, constipation, hypothyroidism, depression ease seen today for medical management of chronic diseases.   Patient seen and examined in his room.  He is laying on his bed.  He seems pleasant and comfortable and does not appear to be in distress. He is nonverbal and does not participate in conversations. As per nursing staff patient is nonambulatory, wheelchair dependent he can pivot and stand. He  needs assistance with  food tray set up.  No agitation per nursing staff. No skin breakdowns He consumes about 50 to 75% of his meals.    Past Medical History:  Diagnosis Date   Acute encephalopathy 06/29/2022   Allergic rhinitis, cause unspecified    BPH (benign prostatic hyperplasia)    Carotid artery stenosis, asymptomatic    1-39% by dopplers 09/2016 followed by Dr. Harvey   Cataracts, bilateral    Chronic kidney disease (CKD), stage III (moderate) (HCC)    Complete heart block (HCC) 01/23/2004   s/p Medtronic PPM implanted by Dr Malva   COPD (chronic obstructive pulmonary disease) (HCC)    Diabetes mellitus    Glaucoma    Hyperlipidemia    Hypertension    Insomnia due to medical condition 08/10/2014   Kidney stones    PAF (paroxysmal atrial fibrillation) (HCC) 10/09/2014   Noted on pacer check with 88 mode switches and longest episode >1 hour.  Now on Apixiban for CHADS2VASC score of 5   Past Surgical History:  Procedure Laterality Date   PACEMAKER INSERTION  2005, 04/16/12   MDT implanted by Dr Malva with generator chnage (MDT Adapta L) by Dr Kelsie 04/16/12   PERMANENT PACEMAKER GENERATOR CHANGE N/A 04/16/2012   Procedure: PERMANENT PACEMAKER GENERATOR CHANGE;  Surgeon: Lynwood Kelsie, MD;  Location: [facility] CATH LAB;  Service: Cardiovascular;  Laterality: N/A;   RIGHT/LEFT HEART  CATH AND CORONARY ANGIOGRAPHY N/A 10/18/2020   Procedure: RIGHT/LEFT HEART CATH AND CORONARY ANGIOGRAPHY;  Surgeon: Burnard Debby LABOR, MD;  Location: [facility] INVASIVE CV LAB;  Service: Cardiovascular;  Laterality: N/A;    Allergies  Allergen Reactions   Penicillins Rash and Other (See Comments)    Childhood allergy    Irbesartan     Per [facility]   Penicillin G Benzathine Rash    Allergies as of 03/26/2024       Reactions   Penicillins Rash, Other (See Comments)   Childhood allergy    Irbesartan    Per [facility]   Penicillin G Benzathine Rash        Medication List        Accurate as of March 26, 2024  2:12 PM. If you have any  questions, ask your nurse or doctor.          acetaminophen  325 MG tablet Commonly known as: TYLENOL  Take 650 mg by mouth 2 (two) times daily. Scheduled, see as need listing also   acetaminophen  325 MG tablet Commonly known as: TYLENOL  Take 2 tablets (650 mg total) by mouth every 6 (six) hours as needed for mild pain or headache.   cholestyramine 4 g packet Commonly known as: QUESTRAN Take 4 g by mouth 2 (two) times daily.   dextromethorphan-guaiFENesin 10-100 MG/5ML liquid Commonly known as: ROBITUSSIN-DM Take 10 mLs by mouth every 6 (six) hours as needed for cough.   divalproex  125 MG capsule Commonly known as: DEPAKOTE  SPRINKLE Take 250 mg by mouth 3 (three) times daily.   escitalopram 10 MG tablet Commonly known as: LEXAPRO Take 10 mg by mouth daily.   fexofenadine 60 MG tablet Commonly known as: ALLEGRA Take 60 mg by mouth daily.   fluticasone 50 MCG/ACT nasal spray Commonly known as: FLONASE Place 1 spray into both nostrils daily.   ipratropium-albuterol 0.5-2.5 (3) MG/3ML Soln Commonly known as: DUONEB Take 3 mLs by nebulization every 8 (eight) hours as needed.   levothyroxine  100 MCG tablet Commonly known as: SYNTHROID  Take 100 mcg by mouth daily before breakfast.   LORazepam  0.5 MG tablet Commonly known as: ATIVAN  Give 1 tablet daily on Monday and Wednesday   nitroGLYCERIN  0.4 MG SL tablet Commonly known as: NITROSTAT  Place 1 tablet (0.4 mg total) under the tongue every 5 (five) minutes as needed for chest pain.   OXYGEN 2lpm as needed for SOB   saccharomyces boulardii 250 MG capsule Commonly known as: FLORASTOR Take 250 mg by mouth 2 (two) times daily.   senna 8.6 MG Tabs tablet Commonly known as: SENOKOT Take 2 tablets by mouth as needed for mild constipation. And two tablets in the evening scheduled        Review of Systems  Constitutional:  Negative for fever.  Respiratory:  Negative for cough.   Gastrointestinal:  Negative for  blood in stool.  Genitourinary:  Negative for hematuria.  Psychiatric/Behavioral:  Negative for agitation.     Immunization History  Administered Date(s) Administered   Fluad Quad(high Dose 65+) 08/05/2023   Influenza-Unspecified 07/04/2014, 08/05/2022, 08/05/2023   Moderna Covid-19 Vaccine Bivalent Booster 13yrs & up 07/18/2022   Moderna SARS-COV2 Booster Vaccination 07/25/2020, 02/13/2021   Moderna Sars-Covid-2 Vaccination 09/20/2019, 10/18/2019   PNEUMOCOCCAL CONJUGATE-20 08/07/2022   Pfizer Covid-19 Vaccine Bivalent Booster 76yrs & up 06/20/2021, 08/19/2023   Respiratory Syncytial Virus Vaccine,Recomb Aduvanted(Arexvy) 10/04/2022   Tdap 10/28/2022   Zoster Recombinant(Shingrix) 11/06/2017, 03/28/2018   Pertinent  Health Maintenance Due  Topic Date Due  FOOT EXAM  10/19/2023   HEMOGLOBIN A1C  02/04/2024   INFLUENZA VACCINE  04/16/2024   OPHTHALMOLOGY EXAM  Discontinued      07/05/2022    4:50 PM 10/18/2022   11:21 AM 11/04/2022    2:33 PM 11/15/2022    8:54 AM 12/26/2022   11:29 AM  Fall Risk  Falls in the past year? 0 0 0 0 0  Was there an injury with Fall? 0 0 0 0 0  Fall Risk Category Calculator 0 0 0 0 0  Fall Risk Category (Retired) Low       (RETIRED) Patient Fall Risk Level High fall risk       Patient at Risk for Falls Due to History of fall(s) No Fall Risks  No Fall Risks No Fall Risks  Fall risk Follow up Falls evaluation completed  Falls evaluation completed Falls evaluation completed Falls evaluation completed Falls evaluation completed     Data saved with a previous flowsheet row definition   Functional Status Survey:    Vitals:   03/26/24 1404  BP: 127/69  Pulse: 65  Resp: 19  Temp: (!) 97.4 F (36.3 C)  SpO2: 95%  Weight: 138 lb 12.8 oz (63 kg)  Height: 5' 6 (1.676 m)   Body mass index is 22.4 kg/m. Physical Exam Constitutional:      Appearance: Normal appearance.  Cardiovascular:     Rate and Rhythm: Normal rate and regular rhythm.      Pulses: Normal pulses.     Heart sounds: Normal heart sounds.  Pulmonary:     Effort: No respiratory distress.     Breath sounds: No stridor. No wheezing or rales.  Abdominal:     General: Bowel sounds are normal. There is no distension.     Palpations: Abdomen is soft.     Tenderness: There is no abdominal tenderness. There is no guarding.  Musculoskeletal:        General: No swelling.  Neurological:     Mental Status: He is alert. Mental status is at baseline.     Motor: No weakness.     Labs reviewed: Recent Labs    05/16/23 0000  NA 143  K 3.5  CL 107  CO2 24*  BUN 23*  CREATININE 1.5*  CALCIUM  8.5*   Recent Labs    05/16/23 0000  AST 9*  ALT 14  ALKPHOS 67  ALBUMIN 3.3*   Recent Labs    05/16/23 0000  WBC 9.6  NEUTROABS 5,885.00  HGB 16.1  HCT 50  PLT 286   Lab Results  Component Value Date   TSH 2.42 09/30/2023   Lab Results  Component Value Date   HGBA1C 6.0 08/07/2023   Lab Results  Component Value Date   CHOL 128 12/16/2016   HDL 55 12/16/2016   LDLCALC 58 12/16/2016   TRIG 97 06/29/2022   CHOLHDL 2.3 12/16/2016    Significant Diagnostic Results in last 30 days:  No results found.  Assessment/Plan    Major neurocognitive disorder No agitation per nursing staff Patient is nonverbal and does not participate in conversations Continue with supportive care Continue assistance with ADLs Continue with Depakote , Ativan  as needed   Hospice care patient Patient seems pleasant and comfortable and does not appear to be in distress Continue with Ativan  as needed, Tylenol   Seasonal allergies Continue with Allegra, Flonase  Hypothyroidism Continue with the levothyroxine   Depression Continue with Lexapro.   30 min Total time spent for obtaining history,  performing a medically appropriate examination and evaluation, reviewing the tests,  documenting clinical information in the electronic or other health record, I ,care coordination  (not separately reported)

## 2024-04-16 DEATH — deceased
